# Patient Record
Sex: Female | Born: 1960 | Race: Black or African American | Marital: Single | Smoking: Never smoker
Health system: Southern US, Community
[De-identification: ages and names within clinical notes are randomized; demographics above are authoritative.]

## PROBLEM LIST (undated history)

## (undated) DIAGNOSIS — K754 Autoimmune hepatitis: Secondary | ICD-10-CM

## (undated) DIAGNOSIS — R7611 Nonspecific reaction to tuberculin skin test without active tuberculosis: Secondary | ICD-10-CM

## (undated) DIAGNOSIS — G43909 Migraine, unspecified, not intractable, without status migrainosus: Secondary | ICD-10-CM

## (undated) DIAGNOSIS — Z86010 Personal history of colonic polyps: Secondary | ICD-10-CM

## (undated) DIAGNOSIS — K7460 Unspecified cirrhosis of liver: Secondary | ICD-10-CM

## (undated) DIAGNOSIS — N189 Chronic kidney disease, unspecified: Secondary | ICD-10-CM

## (undated) DIAGNOSIS — D571 Sickle-cell disease without crisis: Secondary | ICD-10-CM

## (undated) DIAGNOSIS — F419 Anxiety disorder, unspecified: Secondary | ICD-10-CM

## (undated) DIAGNOSIS — M797 Fibromyalgia: Secondary | ICD-10-CM

## (undated) DIAGNOSIS — K648 Other hemorrhoids: Secondary | ICD-10-CM

## (undated) DIAGNOSIS — N289 Disorder of kidney and ureter, unspecified: Secondary | ICD-10-CM

## (undated) DIAGNOSIS — D509 Iron deficiency anemia, unspecified: Secondary | ICD-10-CM

## (undated) DIAGNOSIS — K644 Residual hemorrhoidal skin tags: Secondary | ICD-10-CM

## (undated) DIAGNOSIS — F32A Depression, unspecified: Secondary | ICD-10-CM

## (undated) DIAGNOSIS — Z789 Other specified health status: Secondary | ICD-10-CM

## (undated) DIAGNOSIS — M5020 Other cervical disc displacement, unspecified cervical region: Secondary | ICD-10-CM

## (undated) DIAGNOSIS — E785 Hyperlipidemia, unspecified: Secondary | ICD-10-CM

## (undated) DIAGNOSIS — K219 Gastro-esophageal reflux disease without esophagitis: Secondary | ICD-10-CM

## (undated) DIAGNOSIS — Z944 Liver transplant status: Secondary | ICD-10-CM

## (undated) DIAGNOSIS — F329 Major depressive disorder, single episode, unspecified: Secondary | ICD-10-CM

## (undated) HISTORY — DX: Migraine, unspecified, not intractable, without status migrainosus: G43.909

## (undated) HISTORY — DX: Chronic kidney disease, unspecified: N18.9

## (undated) HISTORY — DX: Hyperlipidemia, unspecified: E78.5

## (undated) HISTORY — DX: Iron deficiency anemia, unspecified: D50.9

## (undated) HISTORY — DX: Unspecified cirrhosis of liver: K74.60

## (undated) HISTORY — DX: Depression, unspecified: F32.A

## (undated) HISTORY — DX: Other specified health status: Z78.9

## (undated) HISTORY — DX: Gastro-esophageal reflux disease without esophagitis: K21.9

## (undated) HISTORY — DX: Sickle-cell disease without crisis: D57.1

## (undated) HISTORY — DX: Personal history of colonic polyps: Z86.010

## (undated) HISTORY — DX: Residual hemorrhoidal skin tags: K64.4

## (undated) HISTORY — DX: Major depressive disorder, single episode, unspecified: F32.9

## (undated) HISTORY — DX: Other cervical disc displacement, unspecified cervical region: M50.20

## (undated) HISTORY — DX: Autoimmune hepatitis: K75.4

## (undated) HISTORY — DX: Nonspecific reaction to tuberculin skin test without active tuberculosis: R76.11

## (undated) HISTORY — DX: Disorder of kidney and ureter, unspecified: N28.9

## (undated) HISTORY — DX: Other hemorrhoids: K64.8

## (undated) HISTORY — DX: Anxiety disorder, unspecified: F41.9

## (undated) HISTORY — DX: Fibromyalgia: M79.7

## (undated) HISTORY — DX: Liver transplant status: Z94.4

---

## 1990-06-04 HISTORY — PX: LIVER TRANSPLANT: SHX410

## 1997-08-30 ENCOUNTER — Other Ambulatory Visit: Admission: RE | Admit: 1997-08-30 | Discharge: 1997-08-30 | Payer: Self-pay | Admitting: Internal Medicine

## 1997-11-08 ENCOUNTER — Encounter (HOSPITAL_COMMUNITY): Admission: RE | Admit: 1997-11-08 | Discharge: 1998-02-06 | Payer: Self-pay | Admitting: Internal Medicine

## 1998-01-21 ENCOUNTER — Ambulatory Visit (HOSPITAL_COMMUNITY): Admission: RE | Admit: 1998-01-21 | Discharge: 1998-01-21 | Payer: Self-pay | Admitting: Obstetrics and Gynecology

## 1998-08-02 ENCOUNTER — Emergency Department (HOSPITAL_COMMUNITY): Admission: EM | Admit: 1998-08-02 | Discharge: 1998-08-03 | Payer: Self-pay | Admitting: Emergency Medicine

## 1998-08-03 ENCOUNTER — Encounter: Payer: Self-pay | Admitting: Emergency Medicine

## 1999-03-14 ENCOUNTER — Encounter (INDEPENDENT_AMBULATORY_CARE_PROVIDER_SITE_OTHER): Payer: Self-pay | Admitting: Specialist

## 1999-03-14 ENCOUNTER — Other Ambulatory Visit: Admission: RE | Admit: 1999-03-14 | Discharge: 1999-03-14 | Payer: Self-pay | Admitting: Internal Medicine

## 1999-03-14 ENCOUNTER — Encounter: Payer: Self-pay | Admitting: Internal Medicine

## 1999-09-20 ENCOUNTER — Encounter (HOSPITAL_COMMUNITY): Admission: RE | Admit: 1999-09-20 | Discharge: 1999-12-19 | Payer: Self-pay | Admitting: Internal Medicine

## 2000-01-30 ENCOUNTER — Encounter: Payer: Self-pay | Admitting: Internal Medicine

## 2000-01-30 ENCOUNTER — Ambulatory Visit (HOSPITAL_COMMUNITY): Admission: RE | Admit: 2000-01-30 | Discharge: 2000-01-30 | Payer: Self-pay | Admitting: Internal Medicine

## 2000-12-11 ENCOUNTER — Other Ambulatory Visit: Admission: RE | Admit: 2000-12-11 | Discharge: 2000-12-11 | Payer: Self-pay | Admitting: Obstetrics and Gynecology

## 2001-02-24 LAB — HM HEPATITIS C SCREENING LAB: HM HEPATITIS C SCREENING: NEGATIVE

## 2001-09-29 ENCOUNTER — Encounter: Payer: Self-pay | Admitting: Internal Medicine

## 2001-09-29 ENCOUNTER — Ambulatory Visit (HOSPITAL_COMMUNITY): Admission: RE | Admit: 2001-09-29 | Discharge: 2001-09-29 | Payer: Self-pay | Admitting: Internal Medicine

## 2002-06-22 ENCOUNTER — Encounter: Payer: Self-pay | Admitting: Obstetrics & Gynecology

## 2002-06-22 ENCOUNTER — Ambulatory Visit (HOSPITAL_COMMUNITY): Admission: RE | Admit: 2002-06-22 | Discharge: 2002-06-22 | Payer: Self-pay | Admitting: Obstetrics & Gynecology

## 2002-09-18 ENCOUNTER — Inpatient Hospital Stay (HOSPITAL_COMMUNITY): Admission: AD | Admit: 2002-09-18 | Discharge: 2002-09-18 | Payer: Self-pay | Admitting: Obstetrics & Gynecology

## 2003-03-25 ENCOUNTER — Encounter (HOSPITAL_COMMUNITY): Admission: RE | Admit: 2003-03-25 | Discharge: 2003-06-23 | Payer: Self-pay | Admitting: Internal Medicine

## 2003-09-15 ENCOUNTER — Other Ambulatory Visit: Admission: RE | Admit: 2003-09-15 | Discharge: 2003-09-15 | Payer: Self-pay | Admitting: Obstetrics and Gynecology

## 2003-10-29 ENCOUNTER — Ambulatory Visit (HOSPITAL_COMMUNITY): Admission: RE | Admit: 2003-10-29 | Discharge: 2003-10-29 | Payer: Self-pay | Admitting: Obstetrics and Gynecology

## 2003-11-02 ENCOUNTER — Encounter: Admission: RE | Admit: 2003-11-02 | Discharge: 2003-11-02 | Payer: Self-pay | Admitting: Obstetrics and Gynecology

## 2004-04-26 ENCOUNTER — Encounter: Admission: RE | Admit: 2004-04-26 | Discharge: 2004-04-26 | Payer: Self-pay | Admitting: Obstetrics and Gynecology

## 2004-06-12 ENCOUNTER — Ambulatory Visit: Payer: Self-pay | Admitting: Internal Medicine

## 2004-07-17 ENCOUNTER — Ambulatory Visit: Payer: Self-pay | Admitting: Internal Medicine

## 2004-10-19 ENCOUNTER — Ambulatory Visit: Payer: Self-pay | Admitting: Internal Medicine

## 2004-11-02 ENCOUNTER — Encounter: Admission: RE | Admit: 2004-11-02 | Discharge: 2004-11-02 | Payer: Self-pay | Admitting: Obstetrics and Gynecology

## 2005-02-22 ENCOUNTER — Encounter
Admission: RE | Admit: 2005-02-22 | Discharge: 2005-02-22 | Payer: Self-pay | Admitting: Physical Medicine and Rehabilitation

## 2005-03-15 ENCOUNTER — Ambulatory Visit: Payer: Self-pay | Admitting: Internal Medicine

## 2005-04-12 ENCOUNTER — Other Ambulatory Visit: Admission: RE | Admit: 2005-04-12 | Discharge: 2005-04-12 | Payer: Self-pay | Admitting: Obstetrics and Gynecology

## 2005-08-16 ENCOUNTER — Ambulatory Visit: Payer: Self-pay | Admitting: Internal Medicine

## 2005-09-07 ENCOUNTER — Encounter (HOSPITAL_COMMUNITY): Admission: RE | Admit: 2005-09-07 | Discharge: 2005-09-13 | Payer: Self-pay | Admitting: Internal Medicine

## 2005-10-31 ENCOUNTER — Ambulatory Visit: Payer: Self-pay | Admitting: Internal Medicine

## 2005-11-20 ENCOUNTER — Encounter: Admission: RE | Admit: 2005-11-20 | Discharge: 2005-11-20 | Payer: Self-pay | Admitting: Internal Medicine

## 2005-12-13 ENCOUNTER — Ambulatory Visit: Payer: Self-pay | Admitting: Internal Medicine

## 2005-12-14 ENCOUNTER — Ambulatory Visit (HOSPITAL_COMMUNITY): Admission: RE | Admit: 2005-12-14 | Discharge: 2005-12-14 | Payer: Self-pay | Admitting: Internal Medicine

## 2005-12-26 ENCOUNTER — Ambulatory Visit: Payer: Self-pay | Admitting: Internal Medicine

## 2006-01-02 ENCOUNTER — Ambulatory Visit: Payer: Self-pay | Admitting: Internal Medicine

## 2006-04-26 ENCOUNTER — Ambulatory Visit: Payer: Self-pay | Admitting: Internal Medicine

## 2006-04-26 LAB — CONVERTED CEMR LAB
Albumin: 3.5 g/dL (ref 3.5–5.2)
BUN: 14 mg/dL (ref 6–23)
Calcium: 8.6 mg/dL (ref 8.4–10.5)
Chloride: 107 meq/L (ref 96–112)
HCT: 27.8 % — ABNORMAL LOW (ref 36.0–46.0)
Hemoglobin: 9 g/dL — ABNORMAL LOW (ref 12.0–15.0)
RBC: 3.39 M/uL — ABNORMAL LOW (ref 3.87–5.11)
RDW: 14.5 % (ref 11.5–14.6)
Sodium: 140 meq/L (ref 135–145)
Total Bilirubin: 0.5 mg/dL (ref 0.3–1.2)

## 2006-05-03 ENCOUNTER — Ambulatory Visit: Payer: Self-pay | Admitting: Internal Medicine

## 2006-05-17 ENCOUNTER — Ambulatory Visit (HOSPITAL_COMMUNITY): Admission: RE | Admit: 2006-05-17 | Discharge: 2006-05-17 | Payer: Self-pay | Admitting: Internal Medicine

## 2006-09-17 ENCOUNTER — Ambulatory Visit: Payer: Self-pay | Admitting: Internal Medicine

## 2006-09-17 LAB — CONVERTED CEMR LAB
Alkaline Phosphatase: 96 units/L (ref 39–117)
BUN: 20 mg/dL (ref 6–23)
Basophils Relative: 0.4 % (ref 0.0–1.0)
Chloride: 109 meq/L (ref 96–112)
Glucose, Bld: 103 mg/dL — ABNORMAL HIGH (ref 70–99)
Hemoglobin: 11.3 g/dL — ABNORMAL LOW (ref 12.0–15.0)
Lymphocytes Relative: 23.3 % (ref 12.0–46.0)
MCHC: 35.3 g/dL (ref 30.0–36.0)
Monocytes Relative: 4.4 % (ref 3.0–11.0)
Neutro Abs: 5.6 10*3/uL (ref 1.4–7.7)
Neutrophils Relative %: 70.2 % (ref 43.0–77.0)
Platelets: 155 10*3/uL (ref 150–400)
Potassium: 3.7 meq/L (ref 3.5–5.1)
RBC: 3.55 M/uL — ABNORMAL LOW (ref 3.87–5.11)
RDW: 13.1 % (ref 11.5–14.6)
Total Bilirubin: 0.5 mg/dL (ref 0.3–1.2)

## 2006-11-05 ENCOUNTER — Ambulatory Visit: Payer: Self-pay | Admitting: Internal Medicine

## 2006-11-05 LAB — CONVERTED CEMR LAB
CO2: 28 meq/L (ref 19–32)
Chloride: 109 meq/L (ref 96–112)
Potassium: 3.4 meq/L — ABNORMAL LOW (ref 3.5–5.1)
Sodium: 141 meq/L (ref 135–145)
Total Bilirubin: 0.5 mg/dL (ref 0.3–1.2)

## 2006-12-24 ENCOUNTER — Ambulatory Visit: Payer: Self-pay | Admitting: Internal Medicine

## 2006-12-24 LAB — CONVERTED CEMR LAB
Basophils Relative: 1.2 % — ABNORMAL HIGH (ref 0.0–1.0)
Eosinophils Absolute: 0.1 10*3/uL (ref 0.0–0.6)
Eosinophils Relative: 2.7 % (ref 0.0–5.0)
HCT: 30.4 % — ABNORMAL LOW (ref 36.0–46.0)
MCV: 82.4 fL (ref 78.0–100.0)
Neutrophils Relative %: 47.9 % (ref 43.0–77.0)
RBC: 3.69 M/uL — ABNORMAL LOW (ref 3.87–5.11)
RDW: 14.2 % (ref 11.5–14.6)
WBC: 5.1 10*3/uL (ref 4.5–10.5)

## 2007-01-08 ENCOUNTER — Encounter (HOSPITAL_COMMUNITY): Admission: RE | Admit: 2007-01-08 | Discharge: 2007-03-03 | Payer: Self-pay | Admitting: Internal Medicine

## 2007-02-18 ENCOUNTER — Ambulatory Visit: Payer: Self-pay | Admitting: Internal Medicine

## 2007-02-18 LAB — CONVERTED CEMR LAB
Alkaline Phosphatase: 95 units/L (ref 39–117)
BUN: 14 mg/dL (ref 6–23)
Bilirubin, Direct: 0.1 mg/dL (ref 0.0–0.3)
CO2: 23 meq/L (ref 19–32)
Calcium: 9.2 mg/dL (ref 8.4–10.5)
Eosinophils Relative: 2.1 % (ref 0.0–5.0)
GFR calc Af Amer: 52 mL/min
GFR calc non Af Amer: 43 mL/min
Glucose, Bld: 121 mg/dL — ABNORMAL HIGH (ref 70–99)
Lymphocytes Relative: 35.1 % (ref 12.0–46.0)
MCHC: 33 g/dL (ref 30.0–36.0)
MCV: 84.7 fL (ref 78.0–100.0)
Monocytes Relative: 7.8 % (ref 3.0–11.0)
Platelets: 184 10*3/uL (ref 150–400)
Potassium: 3.7 meq/L (ref 3.5–5.1)
RDW: 18.4 % — ABNORMAL HIGH (ref 11.5–14.6)
Sodium: 144 meq/L (ref 135–145)

## 2007-03-31 ENCOUNTER — Ambulatory Visit: Payer: Self-pay | Admitting: Internal Medicine

## 2007-03-31 LAB — CONVERTED CEMR LAB
Basophils Absolute: 0 10*3/uL (ref 0.0–0.1)
Eosinophils Relative: 1.6 % (ref 0.0–5.0)
HCT: 33.4 % — ABNORMAL LOW (ref 36.0–46.0)
Hemoglobin: 11.3 g/dL — ABNORMAL LOW (ref 12.0–15.0)
Iron: 43 ug/dL (ref 42–145)
MCHC: 33.9 g/dL (ref 30.0–36.0)
MCV: 88.1 fL (ref 78.0–100.0)
Neutro Abs: 3.6 10*3/uL (ref 1.4–7.7)
Neutrophils Relative %: 57.7 % (ref 43.0–77.0)
WBC: 6.1 10*3/uL (ref 4.5–10.5)

## 2007-04-21 ENCOUNTER — Ambulatory Visit: Payer: Self-pay | Admitting: Internal Medicine

## 2007-04-21 LAB — CONVERTED CEMR LAB
Albumin: 3.4 g/dL — ABNORMAL LOW (ref 3.5–5.2)
Alkaline Phosphatase: 82 units/L (ref 39–117)
Basophils Relative: 0.3 % (ref 0.0–1.0)
Bilirubin, Direct: 0.1 mg/dL (ref 0.0–0.3)
Chloride: 105 meq/L (ref 96–112)
Creatinine, Ser: 1.8 mg/dL — ABNORMAL HIGH (ref 0.4–1.2)
Eosinophils Absolute: 0.1 10*3/uL (ref 0.0–0.6)
HCT: 36 % (ref 36.0–46.0)
HDL: 36.1 mg/dL — ABNORMAL LOW (ref >39.0)
Monocytes Relative: 10.2 % (ref 3.0–11.0)
Neutro Abs: 3.1 10*3/uL (ref 1.4–7.7)
Neutrophils Relative %: 54.9 % (ref 43.0–77.0)
Potassium: 3.4 meq/L — ABNORMAL LOW (ref 3.5–5.1)
RDW: 14.4 % (ref 11.5–14.6)
Sodium: 136 meq/L (ref 135–145)
Total Protein: 6.6 g/dL (ref 6.0–8.3)

## 2007-06-23 ENCOUNTER — Ambulatory Visit: Payer: Self-pay | Admitting: Internal Medicine

## 2007-09-16 DIAGNOSIS — K754 Autoimmune hepatitis: Secondary | ICD-10-CM | POA: Insufficient documentation

## 2007-09-16 DIAGNOSIS — D509 Iron deficiency anemia, unspecified: Secondary | ICD-10-CM

## 2007-09-16 DIAGNOSIS — K644 Residual hemorrhoidal skin tags: Secondary | ICD-10-CM | POA: Insufficient documentation

## 2007-09-16 DIAGNOSIS — F419 Anxiety disorder, unspecified: Secondary | ICD-10-CM

## 2007-09-16 DIAGNOSIS — K648 Other hemorrhoids: Secondary | ICD-10-CM | POA: Insufficient documentation

## 2007-09-16 DIAGNOSIS — Z8659 Personal history of other mental and behavioral disorders: Secondary | ICD-10-CM

## 2007-09-22 ENCOUNTER — Ambulatory Visit (HOSPITAL_COMMUNITY): Admission: RE | Admit: 2007-09-22 | Discharge: 2007-09-22 | Payer: Self-pay | Admitting: Internal Medicine

## 2007-09-22 ENCOUNTER — Encounter (INDEPENDENT_AMBULATORY_CARE_PROVIDER_SITE_OTHER): Payer: Self-pay | Admitting: *Deleted

## 2007-10-08 ENCOUNTER — Telehealth: Payer: Self-pay | Admitting: Internal Medicine

## 2007-10-14 ENCOUNTER — Ambulatory Visit: Payer: Self-pay | Admitting: Internal Medicine

## 2007-10-15 LAB — CONVERTED CEMR LAB
ALT: 12 units/L (ref 0–35)
AST: 16 units/L (ref 0–37)
Alkaline Phosphatase: 101 units/L (ref 39–117)
Alkaline Phosphatase: 62 units/L (ref 39–117)
BUN: 13 mg/dL (ref 6–23)
Basophils Absolute: 0 10*3/uL (ref 0.0–0.1)
Basophils Relative: 0.4 % (ref 0.0–1.0)
Bilirubin, Direct: 0.1 mg/dL (ref 0.0–0.3)
CO2: 26 meq/L (ref 19–32)
Calcium: 8.6 mg/dL (ref 8.4–10.5)
Creatinine, Ser: 1.5 mg/dL — ABNORMAL HIGH (ref 0.4–1.2)
Eosinophils Absolute: 0.2 10*3/uL (ref 0.0–0.7)
GFR calc non Af Amer: 40 mL/min
GFR calc non Af Amer: 40 mL/min
HCT: 33.4 % — ABNORMAL LOW (ref 36.0–46.0)
Hemoglobin: 11.5 g/dL — ABNORMAL LOW (ref 12.0–15.0)
Hemoglobin: 8.8 g/dL — ABNORMAL LOW (ref 12.0–15.0)
Lymphocytes Relative: 25.5 % (ref 12.0–46.0)
Lymphocytes Relative: 30.6 % (ref 12.0–46.0)
MCHC: 31.9 g/dL (ref 30.0–36.0)
MCHC: 34.3 g/dL (ref 30.0–36.0)
Monocytes Absolute: 0.5 10*3/uL (ref 0.1–1.0)
Monocytes Absolute: 0.8 10*3/uL — ABNORMAL HIGH (ref 0.2–0.7)
Monocytes Relative: 8.5 % (ref 3.0–12.0)
Neutro Abs: 3 10*3/uL (ref 1.4–7.7)
Neutrophils Relative %: 51.4 % (ref 43.0–77.0)
RDW: 14.2 % (ref 11.5–14.6)
Total Protein: 6.2 g/dL (ref 6.0–8.3)

## 2007-10-17 ENCOUNTER — Encounter (INDEPENDENT_AMBULATORY_CARE_PROVIDER_SITE_OTHER): Payer: Self-pay

## 2007-10-21 ENCOUNTER — Telehealth: Payer: Self-pay | Admitting: Internal Medicine

## 2007-10-24 ENCOUNTER — Ambulatory Visit (HOSPITAL_COMMUNITY): Admission: RE | Admit: 2007-10-24 | Discharge: 2007-10-24 | Payer: Self-pay | Admitting: Internal Medicine

## 2007-10-29 ENCOUNTER — Encounter (HOSPITAL_COMMUNITY): Admission: RE | Admit: 2007-10-29 | Discharge: 2008-01-16 | Payer: Self-pay | Admitting: Internal Medicine

## 2007-10-29 ENCOUNTER — Encounter: Payer: Self-pay | Admitting: Internal Medicine

## 2008-02-04 ENCOUNTER — Ambulatory Visit: Payer: Self-pay | Admitting: Internal Medicine

## 2008-02-04 LAB — CONVERTED CEMR LAB
AST: 20 units/L (ref 0–37)
Alkaline Phosphatase: 103 units/L (ref 39–117)
Basophils Absolute: 0 10*3/uL (ref 0.0–0.1)
Basophils Relative: 0.4 % (ref 0.0–3.0)
Bilirubin, Direct: 0.1 mg/dL (ref 0.0–0.3)
Cholesterol: 185 mg/dL (ref 0–200)
Eosinophils Relative: 3 % (ref 0.0–5.0)
GFR calc Af Amer: 48 mL/min
GFR calc non Af Amer: 40 mL/min
Glucose, Bld: 93 mg/dL (ref 70–99)
HDL: 44 mg/dL (ref >39.0)
LDL Cholesterol: 112 mg/dL — ABNORMAL HIGH (ref 0–99)
Lymphocytes Relative: 35.2 % (ref 12.0–46.0)
MCV: 90.5 fL (ref 78.0–100.0)
Monocytes Absolute: 0.6 10*3/uL (ref 0.1–1.0)
Monocytes Relative: 11.9 % (ref 3.0–12.0)
RBC: 3.96 M/uL (ref 3.87–5.11)
RDW: 13.2 % (ref 11.5–14.6)
Total Bilirubin: 0.6 mg/dL (ref 0.3–1.2)
VLDL: 29 mg/dL (ref 0–40)
WBC: 5.3 10*3/uL (ref 4.5–10.5)

## 2008-02-19 ENCOUNTER — Inpatient Hospital Stay (HOSPITAL_COMMUNITY): Admission: AD | Admit: 2008-02-19 | Discharge: 2008-02-19 | Payer: Self-pay | Admitting: Obstetrics and Gynecology

## 2008-03-10 ENCOUNTER — Ambulatory Visit: Payer: Self-pay | Admitting: Internal Medicine

## 2008-03-15 ENCOUNTER — Telehealth: Payer: Self-pay | Admitting: Internal Medicine

## 2008-03-19 ENCOUNTER — Ambulatory Visit: Payer: Self-pay | Admitting: Internal Medicine

## 2008-03-19 ENCOUNTER — Encounter: Payer: Self-pay | Admitting: Internal Medicine

## 2008-03-23 ENCOUNTER — Encounter: Payer: Self-pay | Admitting: Internal Medicine

## 2008-03-24 ENCOUNTER — Telehealth: Payer: Self-pay | Admitting: Internal Medicine

## 2008-04-04 ENCOUNTER — Encounter: Payer: Self-pay | Admitting: Internal Medicine

## 2008-05-05 DIAGNOSIS — Z860100 Personal history of colon polyps, unspecified: Secondary | ICD-10-CM

## 2008-05-05 HISTORY — DX: Personal history of colon polyps, unspecified: Z86.0100

## 2008-05-05 HISTORY — DX: Personal history of colonic polyps: Z86.010

## 2008-05-11 ENCOUNTER — Ambulatory Visit: Payer: Self-pay | Admitting: Internal Medicine

## 2008-05-12 ENCOUNTER — Encounter: Payer: Self-pay | Admitting: Internal Medicine

## 2008-06-04 HISTORY — PX: ROTATOR CUFF REPAIR: SHX139

## 2008-06-04 HISTORY — PX: ABDOMINAL HYSTERECTOMY: SHX81

## 2008-06-04 HISTORY — PX: KNEE ARTHROSCOPY: SUR90

## 2008-08-30 ENCOUNTER — Encounter: Payer: Self-pay | Admitting: Internal Medicine

## 2008-11-18 ENCOUNTER — Ambulatory Visit (HOSPITAL_COMMUNITY): Admission: RE | Admit: 2008-11-18 | Discharge: 2008-11-18 | Payer: Self-pay | Admitting: Orthopedic Surgery

## 2008-11-25 ENCOUNTER — Ambulatory Visit: Payer: Self-pay | Admitting: Internal Medicine

## 2008-11-26 LAB — CONVERTED CEMR LAB
ALT: 18 units/L (ref 0–35)
AST: 17 units/L (ref 0–37)
Albumin: 3.6 g/dL (ref 3.5–5.2)
BUN: 18 mg/dL (ref 6–23)
Basophils Absolute: 0 10*3/uL (ref 0.0–0.1)
Bilirubin, Direct: 0.1 mg/dL (ref 0.0–0.3)
Calcium: 8.9 mg/dL (ref 8.4–10.5)
Chloride: 106 meq/L (ref 96–112)
Creatinine, Ser: 1.5 mg/dL — ABNORMAL HIGH (ref 0.4–1.2)
Eosinophils Absolute: 0.1 10*3/uL (ref 0.0–0.7)
Eosinophils Relative: 2.1 % (ref 0.0–5.0)
Glucose, Bld: 99 mg/dL (ref 70–99)
HCT: 35.1 % — ABNORMAL LOW (ref 36.0–46.0)
Lymphocytes Relative: 27.6 % (ref 12.0–46.0)
MCHC: 32.6 g/dL (ref 30.0–36.0)
MCV: 85.9 fL (ref 78.0–100.0)
Neutrophils Relative %: 61 % (ref 43.0–77.0)
Platelets: 160 10*3/uL (ref 150.0–400.0)
Potassium: 3.3 meq/L — ABNORMAL LOW (ref 3.5–5.1)
RDW: 15.4 % — ABNORMAL HIGH (ref 11.5–14.6)
Total Bilirubin: 0.6 mg/dL (ref 0.3–1.2)

## 2009-01-10 ENCOUNTER — Telehealth: Payer: Self-pay | Admitting: Internal Medicine

## 2009-01-25 ENCOUNTER — Ambulatory Visit: Payer: Self-pay | Admitting: Internal Medicine

## 2009-01-25 LAB — CONVERTED CEMR LAB
ALT: 26 units/L (ref 0–35)
Albumin: 3.7 g/dL (ref 3.5–5.2)
BUN: 20 mg/dL (ref 6–23)
Chloride: 107 meq/L (ref 96–112)
Creatinine, Ser: 1.7 mg/dL — ABNORMAL HIGH (ref 0.4–1.2)
Glucose, Bld: 131 mg/dL — ABNORMAL HIGH (ref 70–99)
Lymphocytes Relative: 32.2 % (ref 12.0–46.0)
Lymphs Abs: 2.2 10*3/uL (ref 0.7–4.0)
MCHC: 33.3 g/dL (ref 30.0–36.0)
MCV: 86 fL (ref 78.0–100.0)
Monocytes Relative: 7.6 % (ref 3.0–12.0)
Neutrophils Relative %: 56.8 % (ref 43.0–77.0)
Potassium: 3.7 meq/L (ref 3.5–5.1)
RBC: 4.61 M/uL (ref 3.87–5.11)
Sodium: 140 meq/L (ref 135–145)
Total Bilirubin: 0.6 mg/dL (ref 0.3–1.2)
WBC: 6.9 10*3/uL (ref 4.5–10.5)

## 2009-02-14 ENCOUNTER — Ambulatory Visit: Payer: Self-pay | Admitting: Internal Medicine

## 2009-02-22 ENCOUNTER — Ambulatory Visit: Payer: Self-pay | Admitting: Internal Medicine

## 2009-02-22 ENCOUNTER — Telehealth: Payer: Self-pay | Admitting: Internal Medicine

## 2009-02-22 DIAGNOSIS — R197 Diarrhea, unspecified: Secondary | ICD-10-CM

## 2009-02-22 DIAGNOSIS — R944 Abnormal results of kidney function studies: Secondary | ICD-10-CM

## 2009-02-22 DIAGNOSIS — R112 Nausea with vomiting, unspecified: Secondary | ICD-10-CM

## 2009-02-22 DIAGNOSIS — R252 Cramp and spasm: Secondary | ICD-10-CM

## 2009-02-22 DIAGNOSIS — Z944 Liver transplant status: Secondary | ICD-10-CM

## 2009-02-22 DIAGNOSIS — Z9189 Other specified personal risk factors, not elsewhere classified: Secondary | ICD-10-CM | POA: Insufficient documentation

## 2009-02-22 HISTORY — DX: Liver transplant status: Z94.4

## 2009-02-22 LAB — CONVERTED CEMR LAB
Albumin: 3.7 g/dL (ref 3.5–5.2)
Alkaline Phosphatase: 80 units/L (ref 39–117)
Basophils Absolute: 0 10*3/uL (ref 0.0–0.1)
CO2: 27 meq/L (ref 19–32)
Chloride: 109 meq/L (ref 96–112)
Creatinine, Ser: 1.5 mg/dL — ABNORMAL HIGH (ref 0.4–1.2)
Eosinophils Absolute: 0.1 10*3/uL (ref 0.0–0.7)
GFR calc non Af Amer: 39.27 mL/min (ref >60)
Glucose, Bld: 98 mg/dL (ref 70–99)
HCT: 37.2 % (ref 36.0–46.0)
Hemoglobin: 12.2 g/dL (ref 12.0–15.0)
INR: 1 (ref 0.8–1.0)
MCHC: 32.7 g/dL (ref 30.0–36.0)
MCV: 86.2 fL (ref 78.0–100.0)
Neutrophils Relative %: 46.9 % (ref 43.0–77.0)
Platelets: 161 10*3/uL (ref 150.0–400.0)
Potassium: 3.8 meq/L (ref 3.5–5.1)
Prothrombin Time: 10.6 s (ref 9.1–11.7)
RDW: 14.2 % (ref 11.5–14.6)

## 2009-03-30 ENCOUNTER — Encounter: Payer: Self-pay | Admitting: Internal Medicine

## 2009-04-27 ENCOUNTER — Ambulatory Visit: Payer: Self-pay | Admitting: Internal Medicine

## 2009-05-26 ENCOUNTER — Telehealth: Payer: Self-pay | Admitting: Internal Medicine

## 2009-06-10 ENCOUNTER — Ambulatory Visit: Payer: Self-pay | Admitting: Internal Medicine

## 2009-06-13 ENCOUNTER — Encounter: Payer: Self-pay | Admitting: Internal Medicine

## 2009-06-13 LAB — CONVERTED CEMR LAB
AST: 30 units/L (ref 0–37)
Albumin: 3.4 g/dL — ABNORMAL LOW (ref 3.5–5.2)
BUN: 9 mg/dL (ref 6–23)
Basophils Relative: 0.4 % (ref 0.0–3.0)
CO2: 28 meq/L (ref 19–32)
Calcium: 9.1 mg/dL (ref 8.4–10.5)
Eosinophils Absolute: 0.2 10*3/uL (ref 0.0–0.7)
Eosinophils Relative: 3 % (ref 0.0–5.0)
Glucose, Bld: 96 mg/dL (ref 70–99)
HCT: 37.7 % (ref 36.0–46.0)
Hemoglobin: 12 g/dL (ref 12.0–15.0)
Lymphs Abs: 2 10*3/uL (ref 0.7–4.0)
MCHC: 32 g/dL (ref 30.0–36.0)
Neutro Abs: 4.1 10*3/uL (ref 1.4–7.7)
Neutrophils Relative %: 59.1 % (ref 43.0–77.0)
RBC: 4.08 M/uL (ref 3.87–5.11)
Sodium: 138 meq/L (ref 135–145)
WBC: 6.8 10*3/uL (ref 4.5–10.5)

## 2009-06-28 ENCOUNTER — Ambulatory Visit: Payer: Self-pay | Admitting: Internal Medicine

## 2009-06-30 LAB — CONVERTED CEMR LAB
Alkaline Phosphatase: 111 units/L (ref 39–117)
BUN: 13 mg/dL (ref 6–23)
Bilirubin, Direct: 0.1 mg/dL (ref 0.0–0.3)
Chloride: 106 meq/L (ref 96–112)
Creatinine, Ser: 1.4 mg/dL — ABNORMAL HIGH (ref 0.4–1.2)
Eosinophils Absolute: 0.2 10*3/uL (ref 0.0–0.7)
GFR calc non Af Amer: 42.46 mL/min (ref >60)
Glucose, Bld: 94 mg/dL (ref 70–99)
HCT: 39.3 % (ref 36.0–46.0)
Lymphs Abs: 2 10*3/uL (ref 0.7–4.0)
MCV: 90.4 fL (ref 78.0–100.0)
Monocytes Absolute: 0.5 10*3/uL (ref 0.1–1.0)
Neutrophils Relative %: 54.5 % (ref 43.0–77.0)
Platelets: 192 10*3/uL (ref 150.0–400.0)
RDW: 12.7 % (ref 11.5–14.6)
Total Bilirubin: 0.6 mg/dL (ref 0.3–1.2)

## 2009-07-01 ENCOUNTER — Encounter: Payer: Self-pay | Admitting: Internal Medicine

## 2009-08-15 ENCOUNTER — Telehealth: Payer: Self-pay | Admitting: Internal Medicine

## 2009-08-29 ENCOUNTER — Encounter: Payer: Self-pay | Admitting: Internal Medicine

## 2009-09-27 ENCOUNTER — Telehealth: Payer: Self-pay | Admitting: Internal Medicine

## 2009-10-28 ENCOUNTER — Ambulatory Visit: Payer: Self-pay | Admitting: Internal Medicine

## 2009-11-01 ENCOUNTER — Ambulatory Visit: Payer: Self-pay | Admitting: Internal Medicine

## 2009-11-01 LAB — CONVERTED CEMR LAB
Albumin: 3.9 g/dL (ref 3.5–5.2)
Alkaline Phosphatase: 113 units/L (ref 39–117)
Basophils Relative: 0.4 % (ref 0.0–3.0)
CO2: 27 meq/L (ref 19–32)
Chloride: 107 meq/L (ref 96–112)
Direct LDL: 114.1 mg/dL
Eosinophils Absolute: 0.1 10*3/uL (ref 0.0–0.7)
HCT: 39.3 % (ref 36.0–46.0)
Hemoglobin: 13.4 g/dL (ref 12.0–15.0)
MCHC: 34.2 g/dL (ref 30.0–36.0)
MCV: 89.5 fL (ref 78.0–100.0)
Monocytes Absolute: 0.3 10*3/uL (ref 0.1–1.0)
Neutrophils Relative %: 62.3 % (ref 43.0–77.0)
Platelets: 209 10*3/uL (ref 150.0–400.0)
Potassium: 4 meq/L (ref 3.5–5.1)
RDW: 14.7 % — ABNORMAL HIGH (ref 11.5–14.6)
Total Bilirubin: 0.3 mg/dL (ref 0.3–1.2)
Total Protein: 7 g/dL (ref 6.0–8.3)
Triglycerides: 244 mg/dL — ABNORMAL HIGH (ref 0.0–149.0)

## 2009-11-25 ENCOUNTER — Telehealth (INDEPENDENT_AMBULATORY_CARE_PROVIDER_SITE_OTHER): Payer: Self-pay | Admitting: *Deleted

## 2010-01-13 ENCOUNTER — Encounter: Payer: Self-pay | Admitting: Internal Medicine

## 2010-01-25 ENCOUNTER — Encounter (INDEPENDENT_AMBULATORY_CARE_PROVIDER_SITE_OTHER): Payer: Self-pay | Admitting: *Deleted

## 2010-01-30 ENCOUNTER — Telehealth: Payer: Self-pay | Admitting: Internal Medicine

## 2010-03-03 ENCOUNTER — Encounter (INDEPENDENT_AMBULATORY_CARE_PROVIDER_SITE_OTHER): Payer: Self-pay | Admitting: *Deleted

## 2010-03-07 ENCOUNTER — Telehealth: Payer: Self-pay | Admitting: Internal Medicine

## 2010-03-07 ENCOUNTER — Ambulatory Visit: Payer: Self-pay | Admitting: Internal Medicine

## 2010-03-07 LAB — CONVERTED CEMR LAB
ALT: 25 units/L (ref 0–35)
Alkaline Phosphatase: 127 units/L — ABNORMAL HIGH (ref 39–117)
BUN: 15 mg/dL (ref 6–23)
Basophils Relative: 0.4 % (ref 0.0–3.0)
CO2: 27 meq/L (ref 19–32)
Calcium: 9 mg/dL (ref 8.4–10.5)
Chloride: 107 meq/L (ref 96–112)
Creatinine, Ser: 1.3 mg/dL — ABNORMAL HIGH (ref 0.4–1.2)
Eosinophils Absolute: 0.2 10*3/uL (ref 0.0–0.7)
Glucose, Bld: 87 mg/dL (ref 70–99)
Iron: 64 ug/dL (ref 42–145)
Lymphocytes Relative: 32.4 % (ref 12.0–46.0)
MCV: 91.2 fL (ref 78.0–100.0)
Platelets: 172 10*3/uL (ref 150.0–400.0)

## 2010-03-09 ENCOUNTER — Telehealth: Payer: Self-pay | Admitting: Internal Medicine

## 2010-03-23 ENCOUNTER — Ambulatory Visit: Payer: Self-pay | Admitting: Internal Medicine

## 2010-05-10 ENCOUNTER — Ambulatory Visit: Payer: Self-pay | Admitting: Internal Medicine

## 2010-06-01 ENCOUNTER — Encounter: Payer: Self-pay | Admitting: Internal Medicine

## 2010-06-24 ENCOUNTER — Encounter: Payer: Self-pay | Admitting: Obstetrics and Gynecology

## 2010-06-25 ENCOUNTER — Encounter: Payer: Self-pay | Admitting: Internal Medicine

## 2010-06-27 ENCOUNTER — Encounter: Payer: Self-pay | Admitting: Internal Medicine

## 2010-07-04 ENCOUNTER — Ambulatory Visit
Admission: RE | Admit: 2010-07-04 | Discharge: 2010-07-04 | Payer: Self-pay | Source: Home / Self Care | Attending: Internal Medicine | Admitting: Internal Medicine

## 2010-07-04 ENCOUNTER — Other Ambulatory Visit: Payer: Self-pay | Admitting: Internal Medicine

## 2010-07-04 ENCOUNTER — Encounter: Payer: Self-pay | Admitting: Internal Medicine

## 2010-07-04 LAB — COMPREHENSIVE METABOLIC PANEL
ALT: 24 U/L (ref 0–35)
AST: 19 U/L (ref 0–37)
Alkaline Phosphatase: 91 U/L (ref 39–117)
Sodium: 139 mEq/L (ref 135–145)
Total Bilirubin: 0.6 mg/dL (ref 0.3–1.2)
Total Protein: 7 g/dL (ref 6.0–8.3)

## 2010-07-04 LAB — CBC WITH DIFFERENTIAL/PLATELET
Basophils Absolute: 0 10*3/uL (ref 0.0–0.1)
HCT: 39.5 % (ref 36.0–46.0)
Lymphs Abs: 2.8 10*3/uL (ref 0.7–4.0)
MCV: 90.3 fl (ref 78.0–100.0)
Monocytes Absolute: 0.6 10*3/uL (ref 0.1–1.0)
Platelets: 182 10*3/uL (ref 150.0–400.0)
RDW: 13.5 % (ref 11.5–14.6)

## 2010-07-04 NOTE — Letter (Signed)
Summary: [facility] Instructions  [facility]  [address] [place], [place] [address]   Phone: [phone]  Fax: [phone]       [facility]    1960-11-16    MRN: [id number]       Procedure Day /Date:  Thursday 03/23/2010     Arrival Time: 8:30 am     Procedure Time: 9:30 am     Location of Procedure:                    _ x_  [facility] (4th Floor)    PREPARATION FOR COLONOSCOPY WITH MIRALAX  Starting 5 days prior to your procedure Saturday 10/15 do not eat nuts, seeds, popcorn, corn, beans, peas,  salads, or any raw vegetables.  Do not take any fiber supplements (e.g. Metamucil, Citrucel, and Benefiber). ____________________________________________________________________________________________________   THE DAY BEFORE YOUR PROCEDURE         DATE: Wednesday 10/19  1   Drink clear liquids the entire day-NO SOLID FOOD  2   Do not drink anything colored red or purple.  Avoid juices with pulp.  No orange juice.  3   Drink at least 64 oz. (8 glasses) of fluid/clear liquids during the day to prevent dehydration and help the prep work efficiently.  CLEAR LIQUIDS INCLUDE: Water Jello Ice Popsicles Tea (sugar ok, no milk/cream) Powdered fruit flavored drinks Coffee (sugar ok, no milk/cream) Gatorade Juice: apple, white grape, white cranberry  Lemonade Clear bullion, consomm, broth Carbonated beverages (any kind) Strained chicken noodle soup Hard Candy  4   Mix the entire bottle of Miralax with 64 oz. of Gatorade/Powerade in the morning and put in the refrigerator to chill.  5   At 3:00 pm take 2 Dulcolax/Bisacodyl tablets.  6   At 4:30 pm take one Reglan/Metoclopramide tablet.  7  Starting at 5:00 pm drink one 8 oz glass of the Miralax mixture every 15-20 minutes until you have finished drinking the entire 64 oz.  You should finish drinking prep around 7:30 or 8:00 pm.  8   If you are nauseated, you may take the 2nd  Reglan/Metoclopramide tablet at 6:30 pm.        9    At 8:00 pm take 2 more DULCOLAX/Bisacodyl tablets.     THE DAY OF YOUR PROCEDURE      DATE:  Thursday 10/20  You may drink clear liquids until 7:30 am   (2 HOURS BEFORE PROCEDURE).   MEDICATION INSTRUCTIONS  Unless otherwise instructed, you should take regular prescription medications with a small sip of water as early as possible the morning of your procedure.          OTHER INSTRUCTIONS  You will need a responsible adult at least 51 years of age to accompany you and drive you home.   This person must remain in the waiting room during your procedure.  Wear loose fitting clothing that is easily removed.  Leave jewelry and other valuables at home.  However, you may wish to bring a book to read or an iPod/MP3 player to listen to music as you wait for your procedure to start.  Remove all body piercing jewelry and leave at home.  Total time from sign-in until discharge is approximately 2-3 hours.  You should go home directly after your procedure and rest.  You can resume normal activities the day after your procedure.  The day of your procedure you should not:   Drive  Make legal decisions   Operate machinery   Drink alcohol   Return to work  You will receive specific instructions about eating, activities and medications before you leave.   The above instructions have been reviewed and explained to me by   Ezra Sites RN  March 07, 2010 10:15 AM     I fully understand and can verbalize these instructions _____________________________ Date _______

## 2010-07-04 NOTE — Progress Notes (Signed)
Summary: refills  Phone Note Call from Patient Call back at Home Phone [phone]   Caller: Patient Call For: Dr [name] Reason for Call: Talk to Nurse Summary of Call: Patient needs refills for Clonazepam Initial call taken by: Tawni Levy,  January 30, 2010 11:51 AM  Follow-up for Phone Call        Patient advised that she is due for colonoscopy. She has set up procedure for March 23, 2010. I will give her enough clonazepam to get her through until then. Patient also requests refills on tennuate dospan. Dr [name], are you okay with her getting refills on her diet pills? Follow-up by: Lamona Curl CMA Duncan Dull),  January 30, 2010 12:24 PM  Additional Follow-up for Phone Call Additional follow up Details #1::        She can have 1 refill only. She has to show that she has lost weight. OK to refill Clonazepam till I see her. Additional Follow-up by: Hart Carwin MD,  January 30, 2010 2:31 PM    Additional Follow-up for Phone Call Additional follow up Details #2::    Prescription faxed to pharmacy. Follow-up by: Lamona Curl CMA Duncan Dull),  January 30, 2010 3:18 PM  Prescriptions: TENNUATE DOSPAN 75 MG TABLET Take 1 tablet by mouth once daily  #30 x 0   Entered by:   Lamona Curl CMA (AAMA)   Authorized by:   Hart Carwin MD   Signed by:   Lamona Curl CMA (AAMA) on 01/30/2010   Method used:   Faxed to ...       [facility] [address] [phone]* (retail)       [address]       [place], [place]  [address]       Ph: [phone] or [phone]       Fax: [phone]   RxID:   [id number] KLONOPIN 1 MG TABS (CLONAZEPAM) Take 1 tablet by mouth two times a day  #60 x 1   Entered by:   Lamona Curl CMA (AAMA)   Authorized by:   Hart Carwin MD   Signed by:   Lamona Curl CMA (AAMA) on 01/30/2010   Method used:   Printed then faxed to ...       [facility] [address] [phone]* (retail)       [address]  [place], [place]  [address]       Ph: [phone] or [phone]       Fax: [phone]   RxID:   [id number]

## 2010-07-04 NOTE — Letter (Signed)
Summary: Colonoscopy Letter  [facility]  [address] [place], [place] [address]   Phone: [phone]  Fax: [phone]      January 25, 2010 MRN: [id number]   [facility] [place] [address] [place], [place]  [address]   Dear [name],   According to your medical record, it is time for you to schedule a Colonoscopy. The American Cancer Society recommends this procedure as a method to detect early colon cancer. Patients with a family history of colon cancer, or a personal history of colon polyps or inflammatory bowel disease are at increased risk.  This letter has beeen generated based on the recommendations made at the time of your procedure. If you feel that in your particular situation this may no longer apply, please contact our office.  Please call our office at [phone] to schedule this appointment or to update your records at your earliest convenience.  Thank you for cooperating with [place] to provide you with the very best care possible.   Sincerely,  Hedwig Morton. [name], M.D.  [facility] [phone]

## 2010-07-04 NOTE — Progress Notes (Signed)
Summary: TRIAGE  Medications Added POTASSIUM CHLORIDE CRYS CR 20 MEQ  CR-TABS (POTASSIUM CHLORIDE CRYS CR) Take 1 tablet by mouth two times a day.       ---- Converted from flag ---- ---- 08/10/2009 8:51 AM, Laureen Ochs LPN wrote: Message left for pt. to have labs drawn this week. She was supposed to have them done around 07-27-09. Did she have them done yet? ------------------------------  Phone Note Outgoing Call   Call placed by: Laureen Ochs LPN,  August 15, 2009 11:46 AM Call placed to: Patient Summary of Call: Msg. left for pt. to please have labs drawn this week. Initial call taken by: Laureen Ochs LPN,  August 15, 2009 11:47 AM  Follow-up for Phone Call        Message left for pt. to have labs drawn this week. Laureen Ochs LPN  August 16, 2009 12:17 PM   1) Pt. states she is getting labs drawn at [facility] on 08-22-09, she will make sure they check her K+ and fax results to [phone].  2) DR.BRODIE-Please clarify pt. K+ order, she states she is supposed to take it two times a day, refill was for once daily.    Follow-up by: Laureen Ochs LPN,  August 16, 2009 2:23 PM  Additional Follow-up for Phone Call Additional follow up Details #1::        Yes, we have increased the K+ to 20 mEq by mouth two times a day, since her last K+ was 3.3. She ought to have #60 tabs of K+ at home to take two times a day till I notify her otherwise. Additional Follow-up by: Hart Carwin MD,  August 16, 2009 10:06 PM    Additional Follow-up for Phone Call Additional follow up Details #2::    Above MD orders reviewed with patient. Corrected script sent to her pharmacy. Pt. instructed to call back as needed.  Follow-up by: Laureen Ochs LPN,  August 17, 2009 8:18 AM  New/Updated Medications: POTASSIUM CHLORIDE CRYS CR 20 MEQ  CR-TABS (POTASSIUM CHLORIDE CRYS CR) Take 1 tablet by mouth two times a day. Prescriptions: POTASSIUM CHLORIDE CRYS CR 20 MEQ  CR-TABS (POTASSIUM CHLORIDE CRYS CR) Take  1 tablet by mouth two times a day.  #60 x 6   Entered by:   Laureen Ochs LPN   Authorized by:   Hart Carwin MD   Signed by:   Laureen Ochs LPN on 29/52/8413   Method used:   Electronically to        [facility] [address] [phone]* (retail)       [name]       [place], [place]  [address]       Ph: [phone] or [phone]       Fax: [phone]   RxID:   [id number]

## 2010-07-04 NOTE — Miscellaneous (Signed)
Summary: [facility]  Clinical Lists Changes  Medications: Added new medication of MIRALAX   POWD (POLYETHYLENE GLYCOL 3350) As per prep  instructions. - Signed Added new medication of DULCOLAX 5 MG  TBEC (BISACODYL) Day before procedure take 2 at 3pm and 2 at 8pm. - Signed Added new medication of REGLAN 10 MG  TABS (METOCLOPRAMIDE HCL) As per prep instructions. - Signed Rx of MIRALAX   POWD (POLYETHYLENE GLYCOL 3350) As per prep  instructions.;  #255gm x 0;  Signed;  Entered by: Ezra Sites RN;  Authorized by: Hart Carwin MD;  Method used: Electronically to [facility] [place]. [phone]*, [address], [place], [place], [place]  [address], Ph: [phone] or [phone], Fax: [phone] Rx of DULCOLAX 5 MG  TBEC (BISACODYL) Day before procedure take 2 at 3pm and 2 at 8pm.;  #4 x 0;  Signed;  Entered by: Ezra Sites RN;  Authorized by: Hart Carwin MD;  Method used: Electronically to [facility] [place]. [phone]*, [address], [place], [place], [place]  [address], Ph: [phone] or [phone], Fax: [phone] Rx of REGLAN 10 MG  TABS (METOCLOPRAMIDE HCL) As per prep instructions.;  #2 x 0;  Signed;  Entered by: Ezra Sites RN;  Authorized by: Hart Carwin MD;  Method used: Electronically to [facility] [place]. [phone]*, [address], [place], [place], [place]  [address], Ph: [phone] or [phone], Fax: [phone] Allergies: Changed allergy or adverse reaction from P.O. IRON to P.O. IRON    Prescriptions: REGLAN 10 MG  TABS (METOCLOPRAMIDE HCL) As per prep instructions.  #2 x 0   Entered by:   Ezra Sites RN   Authorized by:   Hart Carwin MD   Signed by:   Ezra Sites RN on 03/07/2010   Method used:   Electronically to        [facility]. [address] [phone]* (retail)       [address]       [place], [place]  [address]       Ph: [phone] or [phone]       Fax: [phone]   RxID:   [id number] DULCOLAX 5 MG  TBEC (BISACODYL) Day before  procedure take 2 at 3pm and 2 at 8pm.  #4 x 0   Entered by:   Ezra Sites RN   Authorized by:   Hart Carwin MD   Signed by:   Ezra Sites RN on 03/07/2010   Method used:   Electronically to        [facility] [address] [phone]* (retail)       [address]       [place], [place]  [address]       Ph: [phone] or [phone]       Fax: [phone]   RxID:   [id number] MIRALAX   POWD (POLYETHYLENE GLYCOL 3350) As per prep  instructions.  #255gm x 0   Entered by:   Ezra Sites RN   Authorized by:   Hart Carwin MD   Signed by:   Ezra Sites RN on 03/07/2010   Method used:   Electronically to        [facility]. [address] [phone]* (retail)       [address]       [place]  [place], [place]  [address]       Ph: [phone] or [phone]       Fax: [phone]   RxID:   [id number]

## 2010-07-04 NOTE — Progress Notes (Signed)
Summary: fax results  Phone Note Call from Patient Call back at Home Phone [phone]   Caller: Patient Call For: Dr. [name] Reason for Call: Talk to Nurse Summary of Call: would like lab results faxed to [facility], fax # [phone] Initial call taken by: Vallarie Mare,  March 09, 2010 4:29 PM  Follow-up for Phone Call        lab results faxed per patient request. Follow-up by: Lamona Curl CMA Duncan Dull),  March 09, 2010 4:55 PM

## 2010-07-04 NOTE — Letter (Signed)
[facility]: [facility]   Imported By: Sherian Rein 09/26/2009 07:27:49  _____________________________________________________________________  External Attachment:    Type:   Image     Comment:   External Document

## 2010-07-04 NOTE — Progress Notes (Signed)
  Phone Note Other Incoming   Request: Send information Summary of Call: Request for records received from [place], [place], [employer], [place]. C. Request forwarded to [employer].

## 2010-07-04 NOTE — Procedures (Signed)
Summary: Colonoscopy  Patient: [name] Note: All result statuses are Final unless otherwise noted.  Tests: (1) Colonoscopy (COL)   COL Colonoscopy           DONE     [facility]     [address]     [place], [place]  [address]           COLONOSCOPY PROCEDURE REPORT           PATIENT:  Cassandra [name], Cassandra [name]  MR#:  [id number]     BIRTHDATE:  [birth year], 48 yrs. old  GENDER:  female     ENDOSCOPIST:  Hedwig Morton. [name], MD     REF. BY:  Geoffry Paradise, M.D.     PROCEDURE DATE:  03/23/2010     PROCEDURE:  Colonoscopy [address]     ASA CLASS:  Class III     INDICATIONS:  Elevated Risk Screening orthopic liver transplant     1992, abnl LFT's     last colon 2000 normal     MEDICATIONS:   Versed 10 mg, Fentanyl 100 mcg, Benadryl 25 mg           DESCRIPTION OF PROCEDURE:   After the risks benefits and     alternatives of the procedure were thoroughly explained, informed     consent was obtained.  Digital rectal exam was performed and     revealed no rectal masses.   The LB [id number]-[id number] endoscope     was introduced through the anus and advanced to the cecum, which     was identified by the ileocecal valve, without limitations.  The     quality of the prep was good, using MiraLax.  The instrument was     then slowly withdrawn as the colon was fully examined.     <<PROCEDUREIMAGES>>           FINDINGS:  No polyps or cancers were seen (see image1, image2,     image3, image4, and image5).   Retroflexed views in the rectum     revealed no abnormalities.    The scope was then withdrawn from     the patient and the procedure completed.           COMPLICATIONS:  None     ENDOSCOPIC IMPRESSION:     1) No polyps or cancers     2) Normal colonoscopy     RECOMMENDATIONS:     1) high fiber diet     REPEAT EXAM:  In 10 year(s) for.           ______________________________     Hedwig Morton. [name], MD           CC:           n.     eSIGNED:   Hedwig Morton. Brodie at 03/23/2010 10:25 AM           Lavetta Nielsen, [phone]  Note: An exclamation mark (!) indicates a result that was not dispersed into the flowsheet. Document Creation Date: 03/23/2010 10:25 AM _______________________________________________________________________  (1) Order result status: Final Collection or observation date-time: 03/23/2010 10:18 Requested date-time:  Receipt date-time:  Reported date-time:  Referring Physician:   Ordering Physician: Lina Sar [phone]) Specimen Source:  Source: Launa Grill Order Number: [id number] Lab site:   Appended Document: Colonoscopy    Clinical Lists Changes  Observations: Added new observation of COLONNXTDUE: 03/2020 (03/23/2010 10:46)

## 2010-07-04 NOTE — Progress Notes (Signed)
Summary: Medication Questions   Phone Note Call from Patient   Caller: Patient Call For: Dr [name] Summary of Call: Patient has been made aware that we will no longer be giving her refills of Flexeril as we gave her several refills to help with her pain (Dr [name] has indicated that she does not wish to refill any longer). Therefore, if she continues having pain which needs to be controlled she should be further evaluated or get refills by her PCP. Patient would like to know if we can continue to give her klonopin for her depression/anxiety as we did at her last office visit.  Initial call taken by: Lamona Curl CMA Duncan Dull),  September 27, 2009 4:13 PM  Follow-up for Phone Call        Yes, I will continue to give her Klonopin since I started it and she needs it long term. Follow-up by: Hart Carwin MD,  September 28, 2009 12:51 PM  Additional Follow-up for Phone Call Additional follow up Details #1::        Patient has been advised that we will continue to refill klonopin as long as she keeps regular follow up appointments with [place]. Patient verbalizes understanding. She is out of the medication at this time so I will send a new prescription to the pharmacy for her. Additional Follow-up by: Lamona Curl CMA Duncan Dull),  September 28, 2009 12:56 PM    Prescriptions: KLONOPIN 1 MG TABS (CLONAZEPAM) Take 1 tablet by mouth two times a day  #60 x 1   Entered by:   Lamona Curl CMA (AAMA)   Authorized by:   Hart Carwin MD   Signed by:   Lamona Curl CMA (AAMA) on 09/28/2009   Method used:   Printed then faxed to ...       [facility] [address] [phone]* (retail)       [address]       [place], [place]  [address]       Ph: [phone] or [phone]       Fax: [phone]   RxID:   [id number]

## 2010-07-04 NOTE — Progress Notes (Signed)
Summary: Medication refill  Phone Note Call from Patient Call back at Home Phone [phone]   Caller: Patient Call For: Dr. [name] Reason for Call: Talk to Nurse Summary of Call: Needs a refill on her Clonazepam and Diethylpropion.[facility]land KitchenMarland KitchenMarland KitchenW[facility] [address] Initial call taken by: Karna Christmas,  March 07, 2010 10:23 AM  Follow-up for Phone Call        Per Dr Regino Schultze last note, patient needs to wait until she sees Dr [name] for further refills on tennuate dospan. Patient will discuss with Dr [name] at her colonoscopy. I will send her a refill on her clonazepam to the pharmacy. Patient advised. Follow-up by: Lamona Curl CMA (AAMA),  March 07, 2010 11:40 AM    New/Updated Medications: KLONOPIN 1 MG TABS (CLONAZEPAM) Take 1 tablet by mouth two times a day Prescriptions: KLONOPIN 1 MG TABS (CLONAZEPAM) Take 1 tablet by mouth two times a day  #60 x 0   Entered by:   Lamona Curl CMA (AAMA)   Authorized by:   Hart Carwin MD   Signed by:   Lamona Curl CMA (AAMA) on 03/07/2010   Method used:   Printed then faxed to ...       [facility] [address] [phone]* (retail)       [address]       [place], [place]  [address]       Ph: [phone] or [phone]       Fax: [phone]   RxID:   [id number]

## 2010-07-04 NOTE — Letter (Signed)
Summary: [facility]  [facility]   Imported By: Sherian Rein 01/20/2010 14:46:14  _____________________________________________________________________  External Attachment:    Type:   Image     Comment:   External Document

## 2010-07-04 NOTE — Assessment & Plan Note (Signed)
Summary: follow up/lk    History of Present Illness Visit Type: Follow-up Visit Primary GI MD: Lina Sar MD Primary Provider: Pearletha Furl. Jacky Kindle, MD  Requesting Provider: n/a Chief Complaint: F/u for cirrhosis. Pt denies any GI complaints  History of Present Illness:   This is a 50 year old African American femalewho is here for a routine followup. 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 She has chronic renal insufficiency. The last office visit at [facility] transplant center was in March 2011. A liver biopsy was entertained  since  there was  mild elevation of transaminases. Patient remains on prednisone 5 mg a day, Prograf (Tacrilimus) 1 mg 3 tabs every morning and 2 tabs every evening as well as Cellcept 500mg   1.5 tab once daily. She is doing  well. She has occasional gastroesophageal reflux for which she takes Nexium. She is also due for a colonoscopy. Her last exam showed a cecal polyp. Her last colonoscopy was in 2000. Her last upper endoscopy was in 2003.   GI Review of Systems      Denies abdominal pain, acid reflux, belching, bloating, chest pain, dysphagia with liquids, dysphagia with solids, heartburn, loss of appetite, nausea, vomiting, vomiting blood, weight loss, and  weight gain.      Reports liver problems.     Denies anal fissure, black tarry stools, change in bowel habit, constipation, diarrhea, diverticulosis, fecal incontinence, heme positive stool, hemorrhoids, irritable bowel syndrome, jaundice, light color stool, rectal bleeding, and  rectal pain.    Current Medications (verified): 1)  Potassium Chloride Crys Cr 20 Meq  Cr-Tabs (Potassium Chloride Crys Cr) .... Take 1 Tablet By Mouth Two Times A Day. 2)  Prograf 1 Mg Caps (Tacrolimus) .... Take 3 Tablets By Mouth in Morning, 2 Tablets At Night 3)  Nexium 40 Mg Cpdr (Esomeprazole Magnesium) .... Take 1 Tablet By Mouth Two Times A Day 4)  Lipitor 10 Mg Tabs (Atorvastatin Calcium) .... Take 1 Tablet By Mouth  Once A Day 5)  Cellcept 500 Mg Tabs (Mycophenolate Mofetil) .Marland Kitchen.. 1 1/2 Tablet By Mouth Once Daily 6)  Prednisone 5mg  Once Daily .Marland Kitchen.. 1 Tablet By Mouth Once Daily 7)  Percocet 10-325 Mg Tabs (Oxycodone-Acetaminophen) .... As Needed For Pain 8)  Alprazolam 1 Mg Tabs (Alprazolam) .... As Needed 9)  Klonopin 1 Mg Tabs (Clonazepam) .... Take 1 Tablet By Mouth Two Times A Day 10)  Prozac 10 Mg Caps (Fluoxetine Hcl) .... Take 1 Capsule By Mouth Once Daily 11)  Flexeril 10 Mg Tabs (Cyclobenzaprine Hcl) .... Take 1 Tab Twice Daily As Needed. Caution Drowsiness. 12)  Lidoderm 5 % Ptch (Lidocaine) .... Use 1 Patch in 24 Hours For Pain 13)  Promethazine Hcl 25 Mg Tabs (Promethazine Hcl) .... As Needed For Nausea  Allergies (verified): 1)  ! Erythromycin 2)  ! Vioxx 3)  ! Nsaids 4)  ! P.o. Iron  Past History:  Past Medical History: Reviewed history from 05/05/2008 and no changes required. 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 Family History of Diabetes: Brother Family History of Heart Disease: Sister No FH of Colon Cancer:  Social History: Reviewed history from 05/11/2008 and no changes required. Alcohol Use - no Illicit Drug  Use - no Patient has never smoked.   Review of Systems       The patient complains of arthritis/joint pain, back pain, change in vision, headaches-new, and voice change.  The patient denies allergy/sinus, anemia, anxiety-new, blood in urine, breast changes/lumps, confusion, cough, coughing up blood, depression-new, fainting, fatigue,  fever, hearing problems, heart murmur, heart rhythm changes, itching, menstrual pain, muscle pains/cramps, night sweats, nosebleeds, pregnancy symptoms, shortness of breath, skin rash, sleeping problems, sore throat, swelling of feet/legs, swollen lymph glands, thirst - excessive , urination - excessive , urination changes/pain, urine leakage, and vision changes.         Pertinent positive and negative review of systems were noted in the above HPI. All other ROS was otherwise negative.   Vital Signs:  Patient profile:   50 year old female Height:      65 inches Weight:      208 pounds BMI:     34.74 BSA:     2.01 Pulse rate:   96 / minute Pulse rhythm:   regular BP sitting:   132 / 86  (left arm) Cuff size:   regular  Vitals Entered By: Ok Anis CMA (Nov 01, 2009 10:24 AM)  Physical Exam  General:  overweight. Alert and oriented Eyes:  nonicteric. Mouth:  normal oral mucosa. Neck:  Supple; no masses or thyromegaly. Breasts:  large breasts. Lungs:  Clear throughout to auscultation. Heart:  Regular rate and rhythm; no murmurs, rubs,  or bruits. Abdomen:  soft nontender abdomen with well-healed surgical scars. Liver edge and posterior margin. No ascites. No distention. No palpable mass. Extremities:  No clubbing, cyanosis, edema or deformities noted. Skin:  Intact without significant lesions or rashes. Psych:  Alert and cooperative. Normal mood and affect.   Impression & Recommendations:  Problem # 1:  LIVER REPLACED BY TRANSPLANT (ICD-V42.7) Patient is status post orthotopic liver transplant almost 20 years ago. She has mild elevation of transaminases. Patient may require another liver biopsy. Her last biopsy 2 years ago showed no active disease. She needs to continue on her immunosuppressive medications including  prednisone 5 mg daily.Results of her labs are forwarded  to [facility] Transplant coordinator.  Problem # 2:  NONSPECIFIC ABNORM RESULTS KIDNEY FUNCTION STUDY  (ICD-794.4) Patient has mild renal insufficiency. Her last creatinine was 1.5.  Problem # 3:  COLONIC POLYPS, ADENOMATOUS, HX OF (ICD-V12.72) Patient's last colonoscopy was in 2000. She is due for a repeat colonoscopy due to adenomatous polyps and immunosuppression.  Problem # 4:  ANEMIA, IRON DEFICIENCY (ICD-280.9) Patient currently has a normal hemoglobin and hematocrit.  Patient Instructions: 1)  Continue immunosuppressive medications. 2)  Schedule colonoscopy in August 2011. A reminder will be sent. 3)  Nexium 40 mg daily. 4)  [place] Dospan 75 mg daily as an appetite suppressant. 5)  Increase exercise and attempt to lose the 30 pounds. 6)  Copy sent to: Dr Jacky Kindle, Dr A[facility]Smith [facility]  7)  The medication list was reviewed and reconciled.  All changed / newly prescribed medications were explained.  A complete medication list was provided to the patient / caregiver. Prescriptions: TENNUATE DOSPAN 75 MG TABLET Take 1 tablet by mouth once daily  #30 x 3   Entered by:   Lamona Curl CMA (AAMA)   Authorized by:   Hart Carwin MD   Signed by:   Lamona Curl CMA (AAMA) on 11/01/2009   Method used:   Printed then faxed to .Marland KitchenMarland Kitchen  [facility] [address] [phone]* (retail)       [address]       [place], [place]  [address]       Ph: [phone] or [phone]       Fax: [phone]   RxID:   [id number]   Appended Document: follow up/lk Left message for patient to remind her that she is due for colonoscopy at this time.

## 2010-07-06 NOTE — Letter (Signed)
Summary: [facility]  [facility]   Imported By: Lennie Odor 06/14/2010 15:53:08  _____________________________________________________________________  External Attachment:    Type:   Image     Comment:   External Document

## 2010-07-20 NOTE — Letter (Signed)
Summary: [facility]   Imported By: Sherian Rein 07/14/2010 07:26:45  _____________________________________________________________________  External Attachment:    Type:   Image     Comment:   External Document

## 2010-09-04 ENCOUNTER — Other Ambulatory Visit: Payer: Self-pay | Admitting: Internal Medicine

## 2010-10-03 ENCOUNTER — Other Ambulatory Visit: Payer: Self-pay | Admitting: Internal Medicine

## 2010-10-03 DIAGNOSIS — E876 Hypokalemia: Secondary | ICD-10-CM

## 2010-10-04 ENCOUNTER — Other Ambulatory Visit (INDEPENDENT_AMBULATORY_CARE_PROVIDER_SITE_OTHER): Payer: Self-pay

## 2010-10-04 ENCOUNTER — Other Ambulatory Visit: Payer: Self-pay | Admitting: Internal Medicine

## 2010-10-04 DIAGNOSIS — E876 Hypokalemia: Secondary | ICD-10-CM

## 2010-10-04 LAB — ELECTROLYTE PANEL: CO2: 24 mEq/L (ref 19–32)

## 2010-10-04 MED ORDER — ESOMEPRAZOLE MAGNESIUM 40 MG PO CPDR: 40.0000 mg | DELAYED_RELEASE_CAPSULE | Freq: Every day | ORAL | Status: DC

## 2010-10-04 NOTE — Telephone Encounter (Signed)
I have spoken to patient to advise her that I will send a 1 month refill of her clonazepam to the pharmacy (see refill noted dated 10/04/10) but that she needs an office visit for further refills. She has scheduled an appointment for 11/16/10 @ 1:45 pm. I have asked that she make certain to keep this appointment so we can continue to provide medication to her. She has requested Nexium samples as well. She states "I dont know why I dont have any more of the Nexium. I havent had enough in like 2 months" I have explained that from our notes, no prescription was ever sent, we gave her samples only. In 2011 Dr Clinton Sawyer her continuation of Nexium so I have offered to send an rx for the patient. Patient then states, "I already have a prescription. Its just that the pharmacist never counts right so I dont get enough." I have provided her with 3 boxes of samples at her request until she can see Dr [name] to discuss this with her further.

## 2010-10-04 NOTE — Telephone Encounter (Signed)
Called and advised patient that we will give her a one month supply of Potassium, however, she does need to have her levels checked. She states that she will come on Friday between 8-5 to have that drawn. Orders have been entered into the computer.

## 2010-10-05 ENCOUNTER — Telehealth: Payer: Self-pay | Admitting: *Deleted

## 2010-10-05 DIAGNOSIS — E876 Hypokalemia: Secondary | ICD-10-CM

## 2010-10-05 NOTE — Telephone Encounter (Signed)
Message copied by Jesse Fall on Thu Oct 05, 2010  2:12 PM ------      Message from: [place], [place]      Created: Wed Oct 04, 2010 11:53 PM       Please refill pt's K+, needs to continue the same dose. Recheck in  2-3 months

## 2010-10-05 NOTE — Telephone Encounter (Signed)
Patient notified of results. States she has a refill for K+ at this time. Scheduled labs in [facility] for 01/05/11.

## 2010-10-17 NOTE — Assessment & Plan Note (Signed)
[facility]                         GASTROENTEROLOGY OFFICE NOTE   NAME:WHITSETTChaz, Cassandra                    MRN:          [id number]  DATE:06/23/2007                            DOB:          1961/03/02    [name] is a 50 year old female with autoimmune hepatitis, cirrhosis,  status post orthopic liver transplant at [facility] in 1992 on  chronic immunosuppressive therapy with Cellcept and Prograf followed on  a yearly basis at [facility].  She is here today to refill some  of her medications.  She has been complaining of symptoms of upper  respiratory infection for which she took Levaquin, but her symptoms  continue with cough and congestion.  She is taking care of daughter's  baby every day between Monday and Friday, and most likely caught the  infection from her.  She also has a history of anxiety and depression,  C5-6 cervical disk herniation and compromised renal function with  creatinine recently rising to 1.8.  She has been intermittently  overweight and has iron deficiency anemia.  The last liver biopsy  occurred at [facility] in March 2000.  Last colonoscopy in October 2000.  She  may be ready for repeat colonoscopy when she turns 50.   PHYSICAL EXAMINATION:  Blood pressure:  198/68.  Pulse:  72.  Weight:  178 pounds.  She was alert, oriented and in no distress.  Oral cavity  showed no thrush or exudate.  NECK:  Supple, no adenopathy.  LUNGS:  Clear to auscultation.  COR:  Normal S1, S2.  ABDOMEN:  Soft, nontender with multiple healed surgical scars.  Normoactive bowel sounds.  Liver edge at costal margin although abdomen  was normal.  EXTREMITIES:  No edema.   LABORATORY DATA:  Recently showed hemoglobin of 12.1.  Hematocrit 36  with potassium of 3.4.  Creatinine 1.8, it was 1.4 in September 2008  with BUN of 14.  Glucose 111.  Liver function test was completely normal  with alkaline phosphatase of 82, AST of 16, ALT 14 and  albumin of 3.4.  Her tacrolimus, which  FK506 level was 6.9, therapeutic 5-20.   IMPRESSION:  1. [age]-year-old female status post orthopic liver transplant      for autoimmune liver disease in 1992.  Doing well as far as the      liver is concerned.  2. Autoimmune suppression state.  3. Upper respiratory infection.  4. History of depression, anxiety.  5. Recent rise in creatinine from 1.2 to 1.8.   PLAN:  1. Refill for Xanax 1 mg.  2. Z-Pak.  Follow instructions on the package.  3. The patient has an appointment at [facility] in March of this year to      follow up on her liver      disease.  4. She will be ready for a repeat colonoscopy in October 2009.     Hedwig Morton. [name], MD  Electronically Signed    DMB/MedQ  DD: 06/23/2007  DT: 06/23/2007  Job #: [id number]

## 2010-10-17 NOTE — Letter (Signed)
December 24, 2006     RE:  Cassandra [name], YARBOUGH  MRN:  [id number]  /  DOB:  06/07/1960   TO WHOM IT MAY CONCERN:   This is to confirm that [name] has been a patient under my  care since 1984 for chronic liver disease.  She has history of  autoimmune hepatitis and underwent orthoptic liver transplant at [facility] in  1992.  She has been totally disabled on the basis of her liver disease,  and she has been unable to work permanently.  Her total disability is  evaluated on a regular basis.  The patient is being followed in our  office for her disease and continuing of medications.  She also has  yearly visits to [facility].    Sincerely,      Hedwig Morton. [name], MD  Electronically Signed    DMB/MedQ  DD: 12/24/2006  DT: 12/24/2006  Job #: [id number]

## 2010-10-20 NOTE — H&P (Signed)
NAME:  Cassandra [name], MACKOWIAK                       ACCOUNT NO.:  [id number]   MEDICAL RECORD NO.:  [id number]                   PATIENT TYPE:  AMB   LOCATION:  SDC                                  FACILITY:  [facility]   PHYSICIAN:  Roseanna Rainbow, M.D.         DATE OF BIRTH:  14-Sep-1960   DATE OF ADMISSION:  DATE OF DISCHARGE:                                HISTORY & PHYSICAL   CHIEF COMPLAINT:  The patient is a 50 year old African American female with  abnormal uterine bleeding who presents for endometrial cryoablation.   HISTORY OF PRESENT ILLNESS:  The patient gives a long history of  menorrhagia.  She has been diagnosed with endometrial polyps in the past.  She also gives a history of likely secondary iron deficiency anemia.  A  recent attempt was made to control the bleeding with Seasonale however, the  patient is status post a liver transplant and reported worsening of her  liver studies at the [facility].  The Seasonale was  subsequently discontinued.  Work up to date includes an endometrial biopsy  from January 2004 that failed to demonstrate any hyperplasia or malignancy.  She also had a normal  PAP smear.  A pelvic ultrasound from January 2004  revealed a small myomatous uterus.   PAST OBSTETRIC/GYNECOLOGIC HISTORY:  1. She has been pregnant one time and is status post one NSVD.  2. She has a history of Chlamydia and trichomonas and see the history of     present illness.   REVIEW OF SYSTEMS:  Noncontributory.   PAST MEDICAL HISTORY:  1. Liver failure.  2. Renal insufficiency.  3. Ruptured disk.  4. Headache.  5. History of depression and anxiety disorder.   PAST SURGICAL HISTORY:  Liver transplant in 1992.   ALLERGIES:  No known drug allergies.   SOCIAL HISTORY:  She is a widow.  She is disabled.  She denies any  tobacco,  alcohol or substance abuse.   MEDICATIONS:  1. Prograf.  2. CellCept.  3. Prednisone.  4. Xanax.  5. Hydrocodone.  6.  Nexium.   FAMILY HISTORY:  Remarkable for diabetes mellitus and myocardial infarction.   PHYSICAL EXAMINATION:  VITAL SIGNS:  Height 5 feet 5 inches, weight 157  pounds, blood pressure 120/88, pulse 84, respiratory rate 14, temperature  98.4.  GENERAL:  A well-developed, well-nourished, no apparent distress.  NECK:  Supple.  LUNGS:  Clear to auscultation bilaterally.  HEART:  Regular rate and rhythm.  ABDOMEN:  Soft, nontender, nondistended.  PELVIC:  The external female genitalia are normal  appearing.  On speculum  exam the vagina is clean.  On bimanual exam uterus is anteverted,  approximately 10-12 weeks aggregate size, nontender.  The adnexa are not  palpable, nontender bilaterally.   ASSESSMENT:  Small myomatous uterus with secondary menorrhagia.  Poor  candidate for medical management.   PLAN:  The planned procedure is endometrial cryoablation.  The risks,  benefits and alternative forms of management were reviewed with the patient  and informed consent was obtained.  As per her gastroenterologist a PT, PTT  will be obtained two days preoperatively.                                                Roseanna Rainbow, M.D.    Judee Clara  D:  10/09/2002  T:  10/10/2002  Job:  [id number]

## 2010-10-31 ENCOUNTER — Other Ambulatory Visit: Payer: Self-pay | Admitting: *Deleted

## 2010-10-31 ENCOUNTER — Other Ambulatory Visit: Payer: Self-pay | Admitting: Internal Medicine

## 2010-10-31 MED ORDER — POTASSIUM CHLORIDE CRYS ER 20 MEQ PO TBCR: 20.0000 meq | EXTENDED_RELEASE_TABLET | Freq: Two times a day (BID) | ORAL | Status: DC

## 2010-11-16 ENCOUNTER — Ambulatory Visit (INDEPENDENT_AMBULATORY_CARE_PROVIDER_SITE_OTHER): Payer: Medicare Other | Admitting: Internal Medicine

## 2010-11-16 ENCOUNTER — Encounter: Payer: Self-pay | Admitting: Internal Medicine

## 2010-11-16 VITALS — BP 124/62 | HR 88 | Ht 64.0 in | Wt 201.0 lb

## 2010-11-16 DIAGNOSIS — Z944 Liver transplant status: Secondary | ICD-10-CM

## 2010-11-16 DIAGNOSIS — D849 Immunodeficiency, unspecified: Secondary | ICD-10-CM

## 2010-11-16 DIAGNOSIS — K738 Other chronic hepatitis, not elsewhere classified: Secondary | ICD-10-CM

## 2010-11-16 MED ORDER — POTASSIUM CHLORIDE CRYS ER 20 MEQ PO TBCR: 20.0000 meq | EXTENDED_RELEASE_TABLET | Freq: Two times a day (BID) | ORAL | Status: DC

## 2010-11-16 MED ORDER — CLONAZEPAM 1 MG PO TABS: ORAL_TABLET | ORAL | Status: DC

## 2010-11-16 NOTE — Patient Instructions (Addendum)
We will refill Klonopin and Potassium. CC: Dr Jacky Kindle

## 2010-11-16 NOTE — Progress Notes (Signed)
Cassandra [name] 1961/05/10 MRN [id number]      History of Present Illness:  This is a 50 year old African American female who's last office visit was in September 2010. She is status post orthopic liver transplant at [facility] in 1992. His last office visit with Dr. Katrinka Blazing was in March 2012. She is doing very well on Prograf 1 mg 2 tablets in the morning, one at night, CellCept 500 mg, one by mouth twice a day and prednisone 5 mg daily. LFT's have been normal. Dr. Katrinka Blazing indicated there was no need for a repeat liver biopsy. She has a history of iron deficiency anemia. Her last colonoscopy in November 2009 and again in October 2011 was normal. She had adenomatous polyps in 2009 and a shallow ulcer at the ileocecal valve. She has depression and anxiety.   Past Medical History  Diagnosis Date  . Hx of adenomatous colonic polyps   . External hemorrhoids   . Iron deficiency anemia   . Internal hemorrhoids   . Depression   . Anxiety   . Cirrhosis   . Autoimmune hepatitis   . GERD (gastroesophageal reflux disease)   . Fibromyalgia   . Chronic renal insufficiency    Past Surgical History  Procedure Date  . Liver transplant 1992  . Knee arthroscopy   . Rotator cuff repair   . Abdominal hysterectomy     reports that she has never smoked. She does not have any smokeless tobacco history on file. She reports that she does not drink alcohol or use illicit drugs. family history includes Diabetes in her brother and Heart disease in her sister.  There is no history of Colon cancer. Allergies  Allergen Reactions  . Erythromycin     REACTION: Nausea/vomiting  . Nsaids     REACTION: GI Cramping/nausea  . Rofecoxib     REACTION: Nausea/vomiting        Review of Systems: Denies gastroesophageal reflux. Dysphagia odynophagia, shortness of breath or chest pain  The remainder of the 10  point ROS is negative except as outlined in H&P   Physical Exam: General appearance  Well developed, in no  distress. Eyes- non icteric. HEENT nontraumatic, normocephalic. Mouth no lesions, tongue papillated, no cheilosis. Neck supple without adenopathy, thyroid not enlarged, no carotid bruits, no JVD. Lungs Clear to auscultation bilaterally. Cor normal S1 normal S2, regular rhythm , no murmur,  quiet precordium. Abdomen obese abdomen soft with normoactive bowel sounds. No tenderness. Liver edge at costal margin. Well-healed surgical scars. Rectal: Not done. Extremities no pedal edema. Skin no lesions. Neurological alert and oriented x 3. Psychological normal mood and affect.  Assessment and Plan:  Problem #1 Status post liver transplant for autoimmune liver disease. Patient is doing well and has followup at [facility]. She will be due for comprehensive blood testing in the next few weeks.  Problem #2 History of iron deficiency anemia.  Problem #3 History of adenomatous polyps of the colon. She is up-to-date on her colonoscopy.   11/16/2010 Lina Sar

## 2010-11-29 ENCOUNTER — Other Ambulatory Visit: Payer: Self-pay | Admitting: Internal Medicine

## 2010-12-11 ENCOUNTER — Telehealth: Payer: Self-pay | Admitting: *Deleted

## 2010-12-11 ENCOUNTER — Other Ambulatory Visit (INDEPENDENT_AMBULATORY_CARE_PROVIDER_SITE_OTHER): Payer: Medicare Other

## 2010-12-11 DIAGNOSIS — E876 Hypokalemia: Secondary | ICD-10-CM

## 2010-12-11 DIAGNOSIS — K7460 Unspecified cirrhosis of liver: Secondary | ICD-10-CM

## 2010-12-11 LAB — COMPREHENSIVE METABOLIC PANEL
Alkaline Phosphatase: 113 U/L (ref 39–117)
BUN: 17 mg/dL (ref 6–23)
Glucose, Bld: 103 mg/dL — ABNORMAL HIGH (ref 70–99)
Sodium: 141 mEq/L (ref 135–145)
Total Bilirubin: 0.5 mg/dL (ref 0.3–1.2)
Total Protein: 7 g/dL (ref 6.0–8.3)

## 2010-12-11 LAB — CBC WITH DIFFERENTIAL/PLATELET
Basophils Relative: 0.4 % (ref 0.0–3.0)
Eosinophils Relative: 3.1 % (ref 0.0–5.0)
HCT: 39.8 % (ref 36.0–46.0)
Hemoglobin: 13.3 g/dL (ref 12.0–15.0)
Lymphs Abs: 2.4 10*3/uL (ref 0.7–4.0)
MCV: 89.6 fl (ref 78.0–100.0)
Monocytes Absolute: 0.4 10*3/uL (ref 0.1–1.0)
Neutro Abs: 2.8 10*3/uL (ref 1.4–7.7)
RBC: 4.45 Mil/uL (ref 3.87–5.11)
WBC: 5.9 10*3/uL (ref 4.5–10.5)

## 2010-12-11 NOTE — Telephone Encounter (Signed)
Orders entered

## 2010-12-12 ENCOUNTER — Telehealth: Payer: Self-pay | Admitting: *Deleted

## 2010-12-12 NOTE — Telephone Encounter (Signed)
Spoke with patient and she will call [facility] now. Faxed labs to [facility] per patient request.

## 2010-12-12 NOTE — Telephone Encounter (Signed)
Message copied by [name] on Tue Dec 12, 2010  3:25 PM ------      Message from: Hart Carwin      Created: Tue Dec 12, 2010  1:24 PM       Please call pt with low Tacrolimus level and mild elevation of one of the liver tests which may indicate rejection of her transplant. She needs to call [facility] for them to adjust her medications.

## 2010-12-12 NOTE — Telephone Encounter (Signed)
Message copied by [name] on Tue Dec 12, 2010 10:36 AM ------      Message from: Hart Carwin      Created: Mon Dec 11, 2010 10:02 PM       Please call pt with  Satisfactory results, K+  Is slightly low, continue to take daily K+

## 2010-12-12 NOTE — Telephone Encounter (Signed)
Patient notified of lab results. She will continue her Potassium supplement

## 2011-01-03 ENCOUNTER — Telehealth: Payer: Self-pay | Admitting: *Deleted

## 2011-01-03 DIAGNOSIS — E876 Hypokalemia: Secondary | ICD-10-CM

## 2011-01-03 NOTE — Telephone Encounter (Signed)
Patient notified of lab appointment. 

## 2011-01-03 NOTE — Telephone Encounter (Signed)
Message copied by [name] on Wed Jan 03, 2011  8:46 AM ------      Message from: [name]      Created: Thu Oct 05, 2010  2:18 PM       Due for K+ lab on 01/05/11. Call and remind

## 2011-01-10 ENCOUNTER — Other Ambulatory Visit: Payer: Self-pay | Admitting: *Deleted

## 2011-01-10 ENCOUNTER — Other Ambulatory Visit: Payer: Self-pay | Admitting: Internal Medicine

## 2011-01-10 DIAGNOSIS — Z79899 Other long term (current) drug therapy: Secondary | ICD-10-CM

## 2011-01-10 DIAGNOSIS — Z298 Encounter for other specified prophylactic measures: Secondary | ICD-10-CM

## 2011-01-10 DIAGNOSIS — Z944 Liver transplant status: Secondary | ICD-10-CM

## 2011-01-15 ENCOUNTER — Other Ambulatory Visit (INDEPENDENT_AMBULATORY_CARE_PROVIDER_SITE_OTHER): Payer: Medicare Other

## 2011-01-15 ENCOUNTER — Other Ambulatory Visit: Payer: Self-pay | Admitting: Internal Medicine

## 2011-01-15 DIAGNOSIS — Z944 Liver transplant status: Secondary | ICD-10-CM

## 2011-01-15 DIAGNOSIS — Z79899 Other long term (current) drug therapy: Secondary | ICD-10-CM

## 2011-01-15 DIAGNOSIS — E876 Hypokalemia: Secondary | ICD-10-CM

## 2011-01-15 DIAGNOSIS — Z298 Encounter for other specified prophylactic measures: Secondary | ICD-10-CM

## 2011-01-15 LAB — CBC WITH DIFFERENTIAL/PLATELET
Basophils Absolute: 0 10*3/uL (ref 0.0–0.1)
Basophils Relative: 0.5 % (ref 0.0–3.0)
Eosinophils Relative: 3.1 % (ref 0.0–5.0)
HCT: 36.7 % (ref 36.0–46.0)
Hemoglobin: 12.3 g/dL (ref 12.0–15.0)
Lymphocytes Relative: 30.3 % (ref 12.0–46.0)
Lymphs Abs: 1.9 10*3/uL (ref 0.7–4.0)
Monocytes Relative: 10.7 % (ref 3.0–12.0)
Neutro Abs: 3.5 10*3/uL (ref 1.4–7.7)
RBC: 4.13 Mil/uL (ref 3.87–5.11)
RDW: 14.6 % (ref 11.5–14.6)

## 2011-01-15 LAB — COMPREHENSIVE METABOLIC PANEL
ALT: 54 U/L — ABNORMAL HIGH (ref 0–35)
BUN: 14 mg/dL (ref 6–23)
CO2: 27 mEq/L (ref 19–32)
Calcium: 8.9 mg/dL (ref 8.4–10.5)
Creatinine, Ser: 1.4 mg/dL — ABNORMAL HIGH (ref 0.4–1.2)
GFR: 43.26 mL/min — ABNORMAL LOW (ref >60.00)
Total Bilirubin: 0.7 mg/dL (ref 0.3–1.2)

## 2011-01-15 LAB — POTASSIUM: Potassium: 3.7 mEq/L (ref 3.5–5.1)

## 2011-01-16 ENCOUNTER — Telehealth: Payer: Self-pay | Admitting: *Deleted

## 2011-01-16 NOTE — Telephone Encounter (Signed)
Left message for patient to call me. Faxed labs to [facility] at [phone].

## 2011-01-16 NOTE — Telephone Encounter (Signed)
Message copied by [name] on Tue Jan 16, 2011  1:32 PM ------      Message from: Hart Carwin      Created: Tue Jan 16, 2011 12:32 PM       Please call pt with low Tacrolimus levels, she needs to call [facility] liver transplant coordinator, for them to adjust her meds.

## 2011-01-16 NOTE — Telephone Encounter (Signed)
Spoke with patient and she has spoken with [facility]. Will mail patient a copy of her labs per her request.

## 2011-02-06 ENCOUNTER — Other Ambulatory Visit: Payer: Self-pay | Admitting: Internal Medicine

## 2011-02-06 ENCOUNTER — Other Ambulatory Visit (INDEPENDENT_AMBULATORY_CARE_PROVIDER_SITE_OTHER): Payer: Medicare Other

## 2011-02-06 ENCOUNTER — Telehealth: Payer: Self-pay | Admitting: *Deleted

## 2011-02-06 DIAGNOSIS — Z298 Encounter for other specified prophylactic measures: Secondary | ICD-10-CM

## 2011-02-06 DIAGNOSIS — Z944 Liver transplant status: Secondary | ICD-10-CM

## 2011-02-06 DIAGNOSIS — Z79899 Other long term (current) drug therapy: Secondary | ICD-10-CM

## 2011-02-06 LAB — CBC WITH DIFFERENTIAL/PLATELET
Basophils Relative: 0.3 % (ref 0.0–3.0)
Eosinophils Absolute: 0.1 10*3/uL (ref 0.0–0.7)
Hemoglobin: 11.9 g/dL — ABNORMAL LOW (ref 12.0–15.0)
Lymphocytes Relative: 18.9 % (ref 12.0–46.0)
MCHC: 33.3 g/dL (ref 30.0–36.0)
Monocytes Relative: 6 % (ref 3.0–12.0)
Neutro Abs: 5.4 10*3/uL (ref 1.4–7.7)
Neutrophils Relative %: 73.6 % (ref 43.0–77.0)
RBC: 3.97 Mil/uL (ref 3.87–5.11)
WBC: 7.3 10*3/uL (ref 4.5–10.5)

## 2011-02-06 LAB — COMPREHENSIVE METABOLIC PANEL
AST: 40 U/L — ABNORMAL HIGH (ref 0–37)
Albumin: 3.7 g/dL (ref 3.5–5.2)
BUN: 16 mg/dL (ref 6–23)
CO2: 28 mEq/L (ref 19–32)
Calcium: 8.6 mg/dL (ref 8.4–10.5)
Chloride: 106 mEq/L (ref 96–112)
Creatinine, Ser: 1.4 mg/dL — ABNORMAL HIGH (ref 0.4–1.2)
GFR: 43.99 mL/min — ABNORMAL LOW (ref >60.00)
Glucose, Bld: 127 mg/dL — ABNORMAL HIGH (ref 70–99)
Potassium: 3.7 mEq/L (ref 3.5–5.1)

## 2011-02-06 NOTE — Telephone Encounter (Signed)
Patient notified of results. Results faxed to [facility] [phone]. Mailed results to patient also per her request.

## 2011-02-06 NOTE — Telephone Encounter (Signed)
Message copied by [name] on Tue Feb 06, 2011  3:18 PM ------      Message from: Hart Carwin      Created: Tue Feb 06, 2011  2:41 PM       Please call pt with abnormal LFT's, she needs to  see her liver transplant doctors at [facility].

## 2011-02-08 ENCOUNTER — Telehealth: Payer: Self-pay | Admitting: *Deleted

## 2011-02-08 NOTE — Telephone Encounter (Signed)
Message copied by [name] on Thu Feb 08, 2011  2:22 PM ------      Message from: Hart Carwin      Created: Thu Feb 08, 2011 12:48 PM       Please call pt , her Tacrolimus level is now back to normal

## 2011-02-08 NOTE — Telephone Encounter (Signed)
Patient given results. Faxed results to [facility] and mailed a copy to patient.

## 2011-02-20 ENCOUNTER — Other Ambulatory Visit: Payer: Self-pay | Admitting: Internal Medicine

## 2011-02-27 ENCOUNTER — Other Ambulatory Visit: Payer: Self-pay | Admitting: Internal Medicine

## 2011-02-28 ENCOUNTER — Other Ambulatory Visit: Payer: Self-pay | Admitting: Internal Medicine

## 2011-02-28 NOTE — Telephone Encounter (Signed)
OK to send a prescription for Nexiem 40 mg, #30, 1 po qd, 6 refills

## 2011-02-28 NOTE — Telephone Encounter (Signed)
Clonazepam was already sent to the pharmacy on 02/27/11. Dr [name], patient is requesting Nexium samples (see 10/04/10 refill note for previous discussion). Can I send her an rx for Nexium? I dont see where one has ever been sent for her.

## 2011-03-01 ENCOUNTER — Other Ambulatory Visit: Payer: Self-pay | Admitting: Internal Medicine

## 2011-03-01 ENCOUNTER — Encounter: Payer: Self-pay | Admitting: Internal Medicine

## 2011-03-01 MED ORDER — ESOMEPRAZOLE MAGNESIUM 40 MG PO CPDR: 40.0000 mg | DELAYED_RELEASE_CAPSULE | Freq: Every day | ORAL | Status: DC

## 2011-03-01 NOTE — Telephone Encounter (Signed)
I have spoken with patient and advised that the clonazepam was already sent to the pharmacy on 02/27/11. I have also advised that we do not have Nexium samples available for her at this time but a new script has been sent to the pharmacy. Patient verbalizes understanding.

## 2011-03-05 LAB — CBC
HCT: 29.6 — ABNORMAL LOW
Hemoglobin: 10 — ABNORMAL LOW
MCHC: 33.6
RBC: 3.23 — ABNORMAL LOW

## 2011-03-05 LAB — COMPREHENSIVE METABOLIC PANEL
ALT: 18
Alkaline Phosphatase: 88
BUN: 13
CO2: 22
GFR calc non Af Amer: 34 — ABNORMAL LOW
Glucose, Bld: 131 — ABNORMAL HIGH
Potassium: 3.7
Sodium: 137

## 2011-03-13 ENCOUNTER — Other Ambulatory Visit: Payer: Self-pay | Admitting: Internal Medicine

## 2011-03-13 ENCOUNTER — Other Ambulatory Visit (INDEPENDENT_AMBULATORY_CARE_PROVIDER_SITE_OTHER): Payer: Medicare Other

## 2011-03-13 DIAGNOSIS — Z2989 Encounter for other specified prophylactic measures: Secondary | ICD-10-CM

## 2011-03-13 DIAGNOSIS — Z79899 Other long term (current) drug therapy: Secondary | ICD-10-CM

## 2011-03-13 DIAGNOSIS — Z944 Liver transplant status: Secondary | ICD-10-CM

## 2011-03-13 DIAGNOSIS — Z298 Encounter for other specified prophylactic measures: Secondary | ICD-10-CM

## 2011-03-13 LAB — CBC WITH DIFFERENTIAL/PLATELET
Basophils Absolute: 0 10*3/uL (ref 0.0–0.1)
Eosinophils Absolute: 0.2 10*3/uL (ref 0.0–0.7)
Eosinophils Relative: 2.2 % (ref 0.0–5.0)
HCT: 42.4 % (ref 36.0–46.0)
Lymphs Abs: 2.8 10*3/uL (ref 0.7–4.0)
MCHC: 33 g/dL (ref 30.0–36.0)
MCV: 89.5 fl (ref 78.0–100.0)
Monocytes Absolute: 0.7 10*3/uL (ref 0.1–1.0)
Platelets: 197 10*3/uL (ref 150.0–400.0)
RDW: 14 % (ref 11.5–14.6)

## 2011-03-13 LAB — COMPREHENSIVE METABOLIC PANEL
ALT: 34 U/L (ref 0–35)
AST: 19 U/L (ref 0–37)
Alkaline Phosphatase: 142 U/L — ABNORMAL HIGH (ref 39–117)
Sodium: 144 mEq/L (ref 135–145)
Total Bilirubin: 0.6 mg/dL (ref 0.3–1.2)
Total Protein: 7.5 g/dL (ref 6.0–8.3)

## 2011-03-14 ENCOUNTER — Telehealth: Payer: Self-pay | Admitting: *Deleted

## 2011-03-14 DIAGNOSIS — R945 Abnormal results of liver function studies: Secondary | ICD-10-CM

## 2011-03-14 DIAGNOSIS — Z944 Liver transplant status: Secondary | ICD-10-CM

## 2011-03-14 LAB — TACROLIMUS LEVEL: Tacrolimus Lvl: 3.5 ng/mL — ABNORMAL LOW (ref 5.0–20.0)

## 2011-03-14 NOTE — Telephone Encounter (Signed)
Message copied by [name] on Wed Mar 14, 2011 11:52 AM ------      Message from: Hart Carwin      Created: Wed Mar 14, 2011 11:36 AM       Please schedule upper abd. Ultrasound wirt attention to liver. Her LFT's are better but the alk ph is still elevated

## 2011-03-14 NOTE — Telephone Encounter (Signed)
Scheduled at [facility] for abdominal ultrasound on 03/16/11 at 9:30 AM(Anna) NPO after midnight. Patient aware of lab results and ultrasound. Lab results faxed to [facility].

## 2011-03-16 ENCOUNTER — Telehealth: Payer: Self-pay | Admitting: *Deleted

## 2011-03-16 ENCOUNTER — Ambulatory Visit (HOSPITAL_COMMUNITY)
Admission: RE | Admit: 2011-03-16 | Discharge: 2011-03-16 | Disposition: A | Discharge status: Home / Self Care | Payer: Medicare Other | Source: Ambulatory Visit | Attending: Internal Medicine | Admitting: Internal Medicine

## 2011-03-16 DIAGNOSIS — Z9089 Acquired absence of other organs: Secondary | ICD-10-CM | POA: Insufficient documentation

## 2011-03-16 DIAGNOSIS — R945 Abnormal results of liver function studies: Secondary | ICD-10-CM

## 2011-03-16 DIAGNOSIS — Z944 Liver transplant status: Secondary | ICD-10-CM | POA: Insufficient documentation

## 2011-03-16 DIAGNOSIS — R748 Abnormal levels of other serum enzymes: Secondary | ICD-10-CM | POA: Insufficient documentation

## 2011-03-16 DIAGNOSIS — R7989 Other specified abnormal findings of blood chemistry: Secondary | ICD-10-CM | POA: Insufficient documentation

## 2011-03-16 NOTE — Telephone Encounter (Signed)
Left a message for patient with results on voice mail.

## 2011-03-16 NOTE — Telephone Encounter (Signed)
Left a message for patient with results.Faxed results to [facility].

## 2011-03-16 NOTE — Telephone Encounter (Signed)
Message copied by [name] on Fri Mar 16, 2011  9:38 AM ------      Message from: Hart Carwin      Created: Thu Mar 15, 2011  5:07 PM       Please call pt with  Slightly low Tacrilimus levels, but higher than last time. The results have been sent to [facility] for them to decide if dose change indicated.

## 2011-03-16 NOTE — Telephone Encounter (Signed)
Message copied by [name] on Fri Mar 16, 2011  4:25 PM ------      Message from: Hart Carwin      Created: Fri Mar 16, 2011  4:24 PM       Please call pt with normal ultrasound of the liver

## 2011-03-22 NOTE — Telephone Encounter (Signed)
Error

## 2011-04-24 ENCOUNTER — Other Ambulatory Visit: Payer: Self-pay | Admitting: Internal Medicine

## 2011-05-01 ENCOUNTER — Other Ambulatory Visit: Payer: Self-pay | Admitting: Internal Medicine

## 2011-05-04 ENCOUNTER — Telehealth: Payer: Self-pay | Admitting: Internal Medicine

## 2011-05-04 ENCOUNTER — Other Ambulatory Visit: Payer: Self-pay | Admitting: Internal Medicine

## 2011-05-04 ENCOUNTER — Other Ambulatory Visit (INDEPENDENT_AMBULATORY_CARE_PROVIDER_SITE_OTHER): Payer: Medicare Other

## 2011-05-04 DIAGNOSIS — Z944 Liver transplant status: Secondary | ICD-10-CM

## 2011-05-04 DIAGNOSIS — Z298 Encounter for other specified prophylactic measures: Secondary | ICD-10-CM

## 2011-05-04 DIAGNOSIS — Z79899 Other long term (current) drug therapy: Secondary | ICD-10-CM

## 2011-05-04 LAB — CBC WITH DIFFERENTIAL/PLATELET
Basophils Relative: 0.4 % (ref 0.0–3.0)
Eosinophils Relative: 3.9 % (ref 0.0–5.0)
Hemoglobin: 13.1 g/dL (ref 12.0–15.0)
Lymphocytes Relative: 37.4 % (ref 12.0–46.0)
MCV: 89.5 fl (ref 78.0–100.0)
Neutro Abs: 3.7 10*3/uL (ref 1.4–7.7)
Neutrophils Relative %: 51.7 % (ref 43.0–77.0)
RBC: 4.37 Mil/uL (ref 3.87–5.11)
WBC: 7.3 10*3/uL (ref 4.5–10.5)

## 2011-05-04 LAB — COMPREHENSIVE METABOLIC PANEL
Albumin: 3.8 g/dL (ref 3.5–5.2)
Alkaline Phosphatase: 126 U/L — ABNORMAL HIGH (ref 39–117)
BUN: 14 mg/dL (ref 6–23)
Calcium: 8.9 mg/dL (ref 8.4–10.5)
Creatinine, Ser: 1.4 mg/dL — ABNORMAL HIGH (ref 0.4–1.2)
Glucose, Bld: 101 mg/dL — ABNORMAL HIGH (ref 70–99)
Potassium: 3.7 mEq/L (ref 3.5–5.1)

## 2011-05-04 NOTE — Telephone Encounter (Signed)
Will send once Dr [name] reviews

## 2011-05-07 ENCOUNTER — Telehealth: Payer: Self-pay | Admitting: Internal Medicine

## 2011-05-07 ENCOUNTER — Telehealth: Payer: Self-pay | Admitting: *Deleted

## 2011-05-07 DIAGNOSIS — K754 Autoimmune hepatitis: Secondary | ICD-10-CM

## 2011-05-07 DIAGNOSIS — K7460 Unspecified cirrhosis of liver: Secondary | ICD-10-CM

## 2011-05-07 NOTE — Telephone Encounter (Signed)
Advised patient that she will need to be fasting at least 6 hours before lipid panel. Patient verbalizes understanding.

## 2011-05-07 NOTE — Telephone Encounter (Signed)
Patient states that she contacted [facility] and they have advised her not to have labwork with [place] until she gets her  "flu" under control. She is apparently on antibiotics from the Urgent Care. She also requests that we send a copy of her labwork to her home address. I have done this per patient request.

## 2011-05-07 NOTE — Telephone Encounter (Signed)
Patient advised of elevated lft's. She states that she will come this week for additional labs as requested by Dr [name].

## 2011-05-07 NOTE — Telephone Encounter (Signed)
Please ask pt if [facility] is going to be ordering her blood tests. How do we know when to draw next set of blood rests? DB

## 2011-05-07 NOTE — Telephone Encounter (Signed)
Message copied by Richardson Chiquito on Mon May 07, 2011  8:36 AM ------      Message from: Hart Carwin      Created: Sat May 05, 2011  2:29 PM       Please call pt with mildly elevated LFT's, last appointm at [facility] 09/2010. Please have her come for ANA, anti DNA, IgG. [facility] DB

## 2011-05-08 NOTE — Telephone Encounter (Signed)
Patient states that she will have labs with [place] after she gets well.

## 2011-06-12 ENCOUNTER — Other Ambulatory Visit (INDEPENDENT_AMBULATORY_CARE_PROVIDER_SITE_OTHER): Payer: Medicare Other

## 2011-06-12 ENCOUNTER — Other Ambulatory Visit: Payer: Self-pay | Admitting: Internal Medicine

## 2011-06-12 DIAGNOSIS — Z298 Encounter for other specified prophylactic measures: Secondary | ICD-10-CM

## 2011-06-12 DIAGNOSIS — Z79899 Other long term (current) drug therapy: Secondary | ICD-10-CM

## 2011-06-12 DIAGNOSIS — Z944 Liver transplant status: Secondary | ICD-10-CM

## 2011-06-12 DIAGNOSIS — K7460 Unspecified cirrhosis of liver: Secondary | ICD-10-CM

## 2011-06-12 DIAGNOSIS — K754 Autoimmune hepatitis: Secondary | ICD-10-CM

## 2011-06-12 LAB — CBC WITH DIFFERENTIAL/PLATELET
Eosinophils Relative: 1.1 % (ref 0.0–5.0)
Lymphocytes Relative: 33 % (ref 12.0–46.0)
Monocytes Absolute: 0.5 10*3/uL (ref 0.1–1.0)
Monocytes Relative: 6.8 % (ref 3.0–12.0)
Neutrophils Relative %: 58.7 % (ref 43.0–77.0)
Platelets: 162 10*3/uL (ref 150.0–400.0)
WBC: 7 10*3/uL (ref 4.5–10.5)

## 2011-06-12 LAB — COMPREHENSIVE METABOLIC PANEL
Albumin: 3.9 g/dL (ref 3.5–5.2)
CO2: 25 mEq/L (ref 19–32)
Calcium: 8.7 mg/dL (ref 8.4–10.5)
GFR: 50.33 mL/min — ABNORMAL LOW (ref >60.00)
Glucose, Bld: 102 mg/dL — ABNORMAL HIGH (ref 70–99)
Potassium: 3.8 mEq/L (ref 3.5–5.1)
Sodium: 142 mEq/L (ref 135–145)
Total Protein: 6.9 g/dL (ref 6.0–8.3)

## 2011-06-12 LAB — LIPID PANEL
Total CHOL/HDL Ratio: 3
Triglycerides: 66 mg/dL (ref 0.0–149.0)

## 2011-06-13 ENCOUNTER — Telehealth: Payer: Self-pay | Admitting: *Deleted

## 2011-06-13 LAB — ANTI-NUCLEAR AB-TITER (ANA TITER)

## 2011-06-13 LAB — ANTI-DNA ANTIBODY, DOUBLE-STRANDED: ds DNA Ab: 7 IU/mL (ref <30)

## 2011-06-13 LAB — ANA: Anti Nuclear Antibody(ANA): POSITIVE — AB

## 2011-06-13 NOTE — Telephone Encounter (Signed)
Patient given results as per Dr. Brodie 

## 2011-06-13 NOTE — Telephone Encounter (Signed)
Message copied by [name] on Wed Jun 13, 2011 10:16 AM ------      Message from: Hart Carwin      Created: Tue Jun 12, 2011  9:32 PM       Please call pt with stable blood tests, normal CBC, mild elevation of LFT's unchanged, renal function unchanged.

## 2011-06-14 ENCOUNTER — Telehealth: Payer: Self-pay | Admitting: *Deleted

## 2011-06-14 NOTE — Telephone Encounter (Signed)
Patient given results as per Dr. [name]. Faxed labs to [facility] at [phone]. Patient has a new address [address] [place], [place] [address].

## 2011-06-14 NOTE — Telephone Encounter (Signed)
Message copied by [name] on Thu Jun 14, 2011  9:10 AM ------      Message from: Hart Carwin      Created: Wed Jun 13, 2011  7:14 PM       Please call pt, her Tacrolimus levels are now in therapeutic range

## 2011-06-29 ENCOUNTER — Other Ambulatory Visit: Payer: Self-pay | Admitting: Gastroenterology

## 2011-07-02 ENCOUNTER — Other Ambulatory Visit: Payer: Self-pay | Admitting: Internal Medicine

## 2011-07-16 ENCOUNTER — Telehealth: Payer: Self-pay | Admitting: Internal Medicine

## 2011-07-16 NOTE — Telephone Encounter (Signed)
Patient calling asking for an rx to be called in for a humidifier because she has gas heat so she can get it with her medical reimbur. Card. States she does not have a PCP to order this. Please, advise.

## 2011-07-16 NOTE — Telephone Encounter (Signed)
OK to order a humidifier for her for medical reasons. She is s/p liver transplant, immunosupressed, can catch pneumonia/asthma, needs a humidifier.

## 2011-07-16 NOTE — Telephone Encounter (Signed)
Rx called to [facility] on [address]. Patient aware.

## 2011-08-29 ENCOUNTER — Other Ambulatory Visit: Payer: Self-pay | Admitting: Internal Medicine

## 2011-09-11 ENCOUNTER — Telehealth: Payer: Self-pay | Admitting: Internal Medicine

## 2011-09-11 NOTE — Telephone Encounter (Signed)
Called pt with regards to obtaining a PCP,  She does not have any. Geographically  It would be convenient to come to our building, female MD- suggested Dr Felicity Coyer. She has some other name. She will call us in next 7 days with the name of a PCP.

## 2011-09-24 ENCOUNTER — Other Ambulatory Visit: Payer: Self-pay | Admitting: Internal Medicine

## 2011-09-26 ENCOUNTER — Telehealth: Payer: Self-pay | Admitting: Internal Medicine

## 2011-09-26 NOTE — Telephone Encounter (Signed)
Patient states that she has decided that she would like to see Dr Felicity Coyer as per Dr Regino Schultze recommendations. I have attempted to reach Dr Diamantina Monks office for an appointment but was unable to get anyone to answer the phone. I will call back shortly to see if I have better luck at that time.

## 2011-09-26 NOTE — Telephone Encounter (Signed)
Dr Felicity Coyer is apparently booked out into October right now. Dr Oliver Barre can see patient in June. Patient is agreeable to this. Patient scheduled for June, 12 @ 9:30 am. I have asked that patient bring copay, insurance cards, all medications to her appointment. I have also advised her of the cancellation/no show policy and she verbalizes understanding of this as well.

## 2011-10-26 ENCOUNTER — Other Ambulatory Visit: Payer: Self-pay | Admitting: Internal Medicine

## 2011-10-30 ENCOUNTER — Other Ambulatory Visit: Payer: Self-pay | Admitting: Internal Medicine

## 2011-10-30 MED ORDER — POTASSIUM CHLORIDE CRYS ER 20 MEQ PO TBCR: EXTENDED_RELEASE_TABLET | ORAL | Status: DC

## 2011-10-30 MED ORDER — CLONAZEPAM 1 MG PO TABS: ORAL_TABLET | ORAL | Status: DC

## 2011-10-30 NOTE — Telephone Encounter (Signed)
rx sent until appt on 12-04-11.

## 2011-11-14 ENCOUNTER — Ambulatory Visit: Payer: Medicare Other | Admitting: Internal Medicine

## 2011-11-19 ENCOUNTER — Telehealth: Payer: Self-pay | Admitting: Internal Medicine

## 2011-11-19 DIAGNOSIS — R0602 Shortness of breath: Secondary | ICD-10-CM

## 2011-11-19 NOTE — Telephone Encounter (Signed)
Please obtain CXR ,PA and lat ,

## 2011-11-19 NOTE — Telephone Encounter (Signed)
Patient calling to report 2 different times when she went outside and it was humid, she had SOB. She thinks she needs to see a pulmonary MD. Told patient if she has anymore episodes to seek care at ED or urgent care. ( she does not have a PCP currently) She is scheduled to see Dr. Jonny Ruiz on 12/13/11 to establish care.

## 2011-11-20 ENCOUNTER — Other Ambulatory Visit (INDEPENDENT_AMBULATORY_CARE_PROVIDER_SITE_OTHER): Payer: Medicare Other

## 2011-11-20 ENCOUNTER — Other Ambulatory Visit: Payer: Self-pay | Admitting: Internal Medicine

## 2011-11-20 ENCOUNTER — Ambulatory Visit (INDEPENDENT_AMBULATORY_CARE_PROVIDER_SITE_OTHER)
Admission: RE | Admit: 2011-11-20 | Discharge: 2011-11-20 | Disposition: A | Discharge status: Home / Self Care | Payer: Medicare Other | Source: Ambulatory Visit | Attending: Internal Medicine | Admitting: Internal Medicine

## 2011-11-20 DIAGNOSIS — Z79899 Other long term (current) drug therapy: Secondary | ICD-10-CM

## 2011-11-20 DIAGNOSIS — Z298 Encounter for other specified prophylactic measures: Secondary | ICD-10-CM

## 2011-11-20 DIAGNOSIS — Z944 Liver transplant status: Secondary | ICD-10-CM

## 2011-11-20 DIAGNOSIS — R0602 Shortness of breath: Secondary | ICD-10-CM

## 2011-11-20 LAB — CBC WITH DIFFERENTIAL/PLATELET
Basophils Absolute: 0.1 10*3/uL (ref 0.0–0.1)
Eosinophils Absolute: 0.2 10*3/uL (ref 0.0–0.7)
HCT: 38.3 % (ref 36.0–46.0)
Lymphs Abs: 1.5 10*3/uL (ref 0.7–4.0)
MCHC: 33.5 g/dL (ref 30.0–36.0)
MCV: 88.5 fl (ref 78.0–100.0)
Monocytes Absolute: 0.5 10*3/uL (ref 0.1–1.0)
Platelets: 143 10*3/uL — ABNORMAL LOW (ref 150.0–400.0)
RDW: 13.9 % (ref 11.5–14.6)

## 2011-11-20 LAB — COMPREHENSIVE METABOLIC PANEL
ALT: 33 U/L (ref 0–35)
Alkaline Phosphatase: 95 U/L (ref 39–117)
CO2: 23 mEq/L (ref 19–32)
GFR: 39.44 mL/min — ABNORMAL LOW (ref >60.00)
Sodium: 140 mEq/L (ref 135–145)
Total Bilirubin: 0.3 mg/dL (ref 0.3–1.2)
Total Protein: 6.9 g/dL (ref 6.0–8.3)

## 2011-11-20 NOTE — Telephone Encounter (Signed)
Spoke with patient and she will come and get CXR.

## 2011-11-21 LAB — TACROLIMUS LEVEL: Tacrolimus Lvl: 2.6 ng/mL — ABNORMAL LOW (ref 5.0–20.0)

## 2011-12-04 ENCOUNTER — Ambulatory Visit (INDEPENDENT_AMBULATORY_CARE_PROVIDER_SITE_OTHER): Payer: Medicare Other | Admitting: Internal Medicine

## 2011-12-04 ENCOUNTER — Encounter: Payer: Self-pay | Admitting: Internal Medicine

## 2011-12-04 VITALS — BP 132/90 | HR 92 | Ht 64.0 in | Wt 200.2 lb

## 2011-12-04 DIAGNOSIS — Z944 Liver transplant status: Secondary | ICD-10-CM

## 2011-12-04 DIAGNOSIS — Z79899 Other long term (current) drug therapy: Secondary | ICD-10-CM

## 2011-12-04 DIAGNOSIS — D849 Immunodeficiency, unspecified: Secondary | ICD-10-CM

## 2011-12-04 NOTE — Progress Notes (Signed)
Cassandra [name] [birth year] MRN [id number]  History of Present Illness:  This is a 51 year old, African American female who is here for a followup visit. 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 She has been followed by Dr. Katrinka Blazing at [facility]. Her last appointment was in April 2013. She is on CellCept 500 mg twice a day and Prograf 2 mg in the morning and 1 mg at bedtime as well as prednisone 5 mg daily. Her last liver function tests last month were normal. Her last liver biopsy was in the spring of 2009. She has a history of iron deficiency anemia. Her hemoglobin was 12.8 and hematocrit was 38.3, her ANA titer is positive at 1:160 titer, IgG level 1.03 g. Anti-DNA was negative. Her last upper abdominal endoscopy in 2003 was normal. Her last colonoscopy was in November 2009 and it showed an ulcer at the ileocecal valve likely due to anti-inflammatory agents and she had an adenomatous polyp.   Past Medical History  Diagnosis Date  . Hx of adenomatous colonic polyps   . External hemorrhoids   . Iron deficiency anemia   . Internal hemorrhoids   . Depression   . Anxiety   . Cirrhosis   . Autoimmune hepatitis   . GERD (gastroesophageal reflux disease)   . Fibromyalgia   . Chronic renal insufficiency    Past Surgical History  Procedure Date  . Liver transplant 1992  . Knee arthroscopy   . Rotator cuff repair   . Abdominal hysterectomy     reports that she has never smoked. She does not have any smokeless tobacco history on file. She reports that she does not drink alcohol or use illicit drugs. family history includes Diabetes in her brother and Heart disease in her sister.  There is no history of Colon cancer. Allergies  Allergen Reactions  . Erythromycin     REACTION: Nausea/vomiting  . Nsaids     REACTION: GI Cramping/nausea  . Rofecoxib     REACTION: Nausea/vomiting        Review of Systems: Denies heartburn dysphagia abdominal pain  change in bowel habits  The remainder of the 10 point ROS is negative except as outlined in H&P   Physical Exam: General appearance  Well developed, in no distress overweight. Eyes- non icteric. HEENT nontraumatic, normocephalic. Mouth no lesions, tongue papillated, no cheilosis. Neck supple without adenopathy, thyroid not enlarged, no carotid bruits, no JVD. Lungs Clear to auscultation bilaterally. Cor normal S1, normal S2, regular rhythm, no murmur,  quiet precordium. Abdomen: Large soft nontender with well-healed surgical scar. No ascites. Liver edge at costal margin. Splenic tip not palpable. Rectal: Soft Hemoccult negative stool. Extremities no pedal edema. Skin no lesions. Neurological alert and oriented x 3. No asterixis Psychological normal mood and affect.  Assessment and Plan:  Problem #1 Status post orthotopic liver transplant 21 years ago at [facility] for autoimmune liver disease. She is doing very well on an immunosuppressive regimen which needs to be continued indefinitely. There is no evidence of rejection. Her level was 2.6 which is slightly below therapeutic level. She needs a followup upper abdominal ultrasound of the liver.  Problem #2 History of iron deficiency anemia. Patient is doing well with a hemoglobin of 12.8. She is up-to-date on her colonoscopy. Her next exam will be due in November 2014.  Problem #3 General medical care. Patient needs a primary care physician. She has an appointment with Dr. Jonny Ruiz.  12/04/2011 Lina Sar

## 2011-12-04 NOTE — Patient Instructions (Addendum)
You have been scheduled for an abdominal ultrasound at [facility] (1st floor of hospital) on 12/12/11 at 9:00 am. Please arrive 15 minutes prior to your appointment for registration. Make certain not to have anything to eat or drink 6 hours prior to your appointment. Should you need to reschedule your appointment, please contact radiology at [phone]. CC: Oliver Barre, MD, Dr Burtis Junes

## 2011-12-06 ENCOUNTER — Encounter: Payer: Self-pay | Admitting: Internal Medicine

## 2011-12-06 DIAGNOSIS — K219 Gastro-esophageal reflux disease without esophagitis: Secondary | ICD-10-CM | POA: Insufficient documentation

## 2011-12-06 DIAGNOSIS — F329 Major depressive disorder, single episode, unspecified: Secondary | ICD-10-CM | POA: Insufficient documentation

## 2011-12-06 DIAGNOSIS — Z0000 Encounter for general adult medical examination without abnormal findings: Secondary | ICD-10-CM | POA: Insufficient documentation

## 2011-12-06 DIAGNOSIS — F32A Depression, unspecified: Secondary | ICD-10-CM | POA: Insufficient documentation

## 2011-12-12 ENCOUNTER — Telehealth: Payer: Self-pay | Admitting: *Deleted

## 2011-12-12 ENCOUNTER — Ambulatory Visit (HOSPITAL_COMMUNITY)
Admission: RE | Admit: 2011-12-12 | Discharge: 2011-12-12 | Disposition: A | Discharge status: Home / Self Care | Payer: Medicare Other | Source: Ambulatory Visit | Attending: Internal Medicine | Admitting: Internal Medicine

## 2011-12-12 DIAGNOSIS — K838 Other specified diseases of biliary tract: Secondary | ICD-10-CM | POA: Insufficient documentation

## 2011-12-12 DIAGNOSIS — Z944 Liver transplant status: Secondary | ICD-10-CM | POA: Insufficient documentation

## 2011-12-12 DIAGNOSIS — K8020 Calculus of gallbladder without cholecystitis without obstruction: Secondary | ICD-10-CM

## 2011-12-12 DIAGNOSIS — Q619 Cystic kidney disease, unspecified: Secondary | ICD-10-CM | POA: Insufficient documentation

## 2011-12-12 NOTE — Telephone Encounter (Signed)
Message copied by Florene Glen on Wed Dec 12, 2011  2:40 PM ------      Message from: Hart Carwin      Created: Wed Dec 12, 2011 12:32 PM       Please call pt with the results of the ultrasound. The liver looks OK but the CBD has enlarged since the last imaging . We need to do a MRCP to rule out stone in the bile dict. Please schedule for an MRCP. " post liver transplant, new ly dilated CBD, normal LFT's"

## 2011-12-12 NOTE — Telephone Encounter (Signed)
Unable to reach pt d/t no mail box set up on home phone. Wrote a letter for pt and I will ask Dr Raphael Gibney CMA to give it to pt tomorrow at his appt at 0930am tomorrow. Left the letter at the front desk of Primary Care for appt tomorrow.

## 2011-12-13 ENCOUNTER — Other Ambulatory Visit (INDEPENDENT_AMBULATORY_CARE_PROVIDER_SITE_OTHER): Payer: Medicare Other

## 2011-12-13 ENCOUNTER — Encounter: Payer: Self-pay | Admitting: Internal Medicine

## 2011-12-13 ENCOUNTER — Ambulatory Visit (INDEPENDENT_AMBULATORY_CARE_PROVIDER_SITE_OTHER): Payer: Medicare Other | Admitting: Internal Medicine

## 2011-12-13 VITALS — BP 110/78 | HR 86 | Temp 98.4°F | Ht 64.0 in | Wt 201.1 lb

## 2011-12-13 DIAGNOSIS — Z0000 Encounter for general adult medical examination without abnormal findings: Secondary | ICD-10-CM

## 2011-12-13 DIAGNOSIS — R7309 Other abnormal glucose: Secondary | ICD-10-CM

## 2011-12-13 DIAGNOSIS — R7302 Impaired glucose tolerance (oral): Secondary | ICD-10-CM

## 2011-12-13 DIAGNOSIS — Z79899 Other long term (current) drug therapy: Secondary | ICD-10-CM

## 2011-12-13 LAB — LIPID PANEL: Total CHOL/HDL Ratio: 3

## 2011-12-13 LAB — BASIC METABOLIC PANEL
Calcium: 9.2 mg/dL (ref 8.4–10.5)
GFR: 46.2 mL/min — ABNORMAL LOW (ref >60.00)
Potassium: 4.5 mEq/L (ref 3.5–5.1)
Sodium: 139 mEq/L (ref 135–145)

## 2011-12-13 LAB — HEMOGLOBIN A1C: Hgb A1c MFr Bld: 5.8 % (ref 4.6–6.5)

## 2011-12-13 NOTE — Assessment & Plan Note (Signed)

## 2011-12-13 NOTE — Patient Instructions (Addendum)
Please remember to followup with your GYN for the yearly pap smear and/or mammogram Please go to LAB in the Basement for the blood and/or urine tests to be done today You will be contacted by phone if any changes need to be made immediately.  Otherwise, you will receive a letter about your results with an explanation. Please continue your efforts at being more active, low cholesterol diet, and weight control. Please keep your appointments with your specialists as you have planned Please return in 6 mo with Lab testing done 3-5 days before
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
## 2011-12-13 NOTE — Assessment & Plan Note (Signed)
stable overall by hx and exam, most recent data reviewed with pt, and pt to continue medical treatment as before For a1c todya

## 2011-12-14 ENCOUNTER — Telehealth: Payer: Self-pay | Admitting: Internal Medicine

## 2011-12-17 NOTE — Telephone Encounter (Signed)
Pt requested a letter to be excused from jury duty. I wrote one letter and then pt called back with her JUROR # , etc. Mailed letters.

## 2011-12-18 ENCOUNTER — Ambulatory Visit (HOSPITAL_COMMUNITY)
Admission: RE | Admit: 2011-12-18 | Discharge: 2011-12-18 | Disposition: A | Discharge status: Home / Self Care | Payer: Medicare Other | Source: Ambulatory Visit | Attending: Internal Medicine | Admitting: Internal Medicine

## 2011-12-18 DIAGNOSIS — K838 Other specified diseases of biliary tract: Secondary | ICD-10-CM | POA: Insufficient documentation

## 2011-12-18 DIAGNOSIS — Z944 Liver transplant status: Secondary | ICD-10-CM | POA: Insufficient documentation

## 2011-12-18 DIAGNOSIS — K8020 Calculus of gallbladder without cholecystitis without obstruction: Secondary | ICD-10-CM

## 2011-12-18 DIAGNOSIS — N281 Cyst of kidney, acquired: Secondary | ICD-10-CM | POA: Insufficient documentation

## 2011-12-18 DIAGNOSIS — K439 Ventral hernia without obstruction or gangrene: Secondary | ICD-10-CM | POA: Insufficient documentation

## 2011-12-18 MED ORDER — GADOBENATE DIMEGLUMINE 529 MG/ML IV SOLN
17.0000 mL | Freq: Once | INTRAVENOUS | Status: AC | PRN
  Administered 2011-12-18: 17 mL via INTRAVENOUS

## 2011-12-22 ENCOUNTER — Other Ambulatory Visit: Payer: Self-pay | Admitting: Internal Medicine

## 2012-01-02 ENCOUNTER — Other Ambulatory Visit: Payer: Self-pay | Admitting: Internal Medicine

## 2012-01-03 ENCOUNTER — Telehealth: Payer: Self-pay | Admitting: Internal Medicine

## 2012-01-03 MED ORDER — CLONAZEPAM 1 MG PO TABS: 1.0000 mg | ORAL_TABLET | Freq: Two times a day (BID) | ORAL | Status: DC | PRN

## 2012-01-03 NOTE — Telephone Encounter (Signed)
Pharmacy states that they did not receive an rx. I will resend.

## 2012-01-24 DIAGNOSIS — Z0279 Encounter for issue of other medical certificate: Secondary | ICD-10-CM

## 2012-02-09 ENCOUNTER — Other Ambulatory Visit: Payer: Self-pay | Admitting: Internal Medicine

## 2012-02-11 ENCOUNTER — Telehealth: Payer: Self-pay | Admitting: *Deleted

## 2012-02-11 DIAGNOSIS — E876 Hypokalemia: Secondary | ICD-10-CM

## 2012-02-11 NOTE — Telephone Encounter (Signed)
Left voice mail advising patient.

## 2012-02-11 NOTE — Telephone Encounter (Signed)
Message copied by Richardson Chiquito on Mon Feb 11, 2012  1:18 PM ------      Message from: Hart Carwin      Created: Mon Feb 11, 2012 11:59 AM       20 mEq once a day, sorry.      ----- Message -----         From: Richardson Chiquito, CMA         Sent: 02/11/2012  10:02 AM           To: Hart Carwin, MD             Just to clarify-         Patient is currently on 20 meq TWICE daily of potassium. Do you want her to decrease to 1 tablet every other day or 1 tablet twice daily every other day?

## 2012-02-11 NOTE — Telephone Encounter (Signed)
Message copied by Richardson Chiquito on Mon Feb 11, 2012  9:56 AM ------      Message from: Hart Carwin      Created: Mon Feb 11, 2012  9:27 AM      Regarding: K+       Please reduce the K+ supplement to 1 qod. Repear K+ in 4 weeks      ----- Message -----         From: Richardson Chiquito, CMA         Sent: 02/11/2012   8:31 AM           To: Hart Carwin, MD            Pt requests refills on potassium. Please see labs drawn by Dr Jonny Ruiz on 12/13/11. Does dose need to be adjusted or should she continue same dose of potassium?

## 2012-02-11 NOTE — Telephone Encounter (Signed)
Patient advised that she should decrease her potassium supplement to 1 tablet every other day and we will repeat levels in 4 weeks. Patient verbalizes understanding. Orders placed in Epic for labwork to be completed 03/12/12

## 2012-03-05 ENCOUNTER — Ambulatory Visit (INDEPENDENT_AMBULATORY_CARE_PROVIDER_SITE_OTHER): Payer: Medicare Other | Admitting: Family Medicine

## 2012-03-05 VITALS — BP 138/90 | HR 101 | Temp 98.4°F | Resp 16 | Ht 64.18 in | Wt 199.2 lb

## 2012-03-05 DIAGNOSIS — Z23 Encounter for immunization: Secondary | ICD-10-CM

## 2012-03-05 DIAGNOSIS — G8929 Other chronic pain: Secondary | ICD-10-CM
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
## 2012-03-06 ENCOUNTER — Other Ambulatory Visit: Payer: Self-pay | Admitting: *Deleted

## 2012-03-06 MED ORDER — POTASSIUM CHLORIDE CRYS ER 20 MEQ PO TBCR: 20.0000 meq | EXTENDED_RELEASE_TABLET | Freq: Every day | ORAL | Status: DC

## 2012-03-20 ENCOUNTER — Other Ambulatory Visit (INDEPENDENT_AMBULATORY_CARE_PROVIDER_SITE_OTHER): Payer: Medicare Other

## 2012-03-20 ENCOUNTER — Other Ambulatory Visit: Payer: Self-pay | Admitting: Internal Medicine

## 2012-03-20 DIAGNOSIS — K7460 Unspecified cirrhosis of liver: Secondary | ICD-10-CM

## 2012-03-20 DIAGNOSIS — E876 Hypokalemia: Secondary | ICD-10-CM

## 2012-03-20 LAB — CBC WITH DIFFERENTIAL/PLATELET
Basophils Absolute: 0.1 10*3/uL (ref 0.0–0.1)
Eosinophils Absolute: 0.1 10*3/uL (ref 0.0–0.7)
HCT: 44.9 % (ref 36.0–46.0)
Hemoglobin: 14.7 g/dL (ref 12.0–15.0)
Lymphs Abs: 2.4 10*3/uL (ref 0.7–4.0)
MCHC: 32.7 g/dL (ref 30.0–36.0)
MCV: 89.9 fl (ref 78.0–100.0)
Monocytes Absolute: 0.7 10*3/uL (ref 0.1–1.0)
Neutro Abs: 5.2 10*3/uL (ref 1.4–7.7)
Platelets: 154 10*3/uL (ref 150.0–400.0)
RDW: 14.6 % (ref 11.5–14.6)

## 2012-03-20 LAB — COMPREHENSIVE METABOLIC PANEL
ALT: 31 U/L (ref 0–35)
Alkaline Phosphatase: 77 U/L (ref 39–117)
CO2: 27 mEq/L (ref 19–32)
Creatinine, Ser: 1.4 mg/dL — ABNORMAL HIGH (ref 0.4–1.2)
GFR: 41.65 mL/min — ABNORMAL LOW (ref >60.00)
Sodium: 141 mEq/L (ref 135–145)
Total Bilirubin: 0.5 mg/dL (ref 0.3–1.2)
Total Protein: 6.8 g/dL (ref 6.0–8.3)

## 2012-03-20 LAB — POTASSIUM: Potassium: 3.8 mEq/L (ref 3.5–5.1)

## 2012-03-21 ENCOUNTER — Other Ambulatory Visit: Payer: Self-pay | Admitting: *Deleted

## 2012-03-21 DIAGNOSIS — E876 Hypokalemia: Secondary | ICD-10-CM

## 2012-03-21 MED ORDER — POTASSIUM CHLORIDE CRYS ER 20 MEQ PO TBCR: EXTENDED_RELEASE_TABLET | ORAL | Status: DC

## 2012-03-28 ENCOUNTER — Encounter: Payer: Self-pay | Admitting: Family Medicine

## 2012-03-29 ENCOUNTER — Other Ambulatory Visit: Payer: Self-pay | Admitting: Internal Medicine

## 2012-03-31 ENCOUNTER — Other Ambulatory Visit: Payer: Self-pay | Admitting: Internal Medicine

## 2012-04-02 ENCOUNTER — Telehealth: Payer: Self-pay | Admitting: Internal Medicine

## 2012-04-02 NOTE — Telephone Encounter (Signed)
Labs faxed to [facility]. Patient advised.

## 2012-04-03 ENCOUNTER — Telehealth: Payer: Self-pay | Admitting: *Deleted

## 2012-04-03 NOTE — Telephone Encounter (Signed)
Spoke with patient and reminded her of lab due next week.

## 2012-04-03 NOTE — Telephone Encounter (Signed)
Message copied by [name] on Thu Apr 03, 2012 10:14 AM ------      Message from: [name]      Created: Fri Mar 21, 2012  2:03 PM       Call and remind patient due for lab recheck for DB on 04/07/12

## 2012-04-08 ENCOUNTER — Other Ambulatory Visit (INDEPENDENT_AMBULATORY_CARE_PROVIDER_SITE_OTHER): Payer: Medicare Other

## 2012-04-08 DIAGNOSIS — E876 Hypokalemia: Secondary | ICD-10-CM

## 2012-04-08 LAB — COMPREHENSIVE METABOLIC PANEL
Albumin: 3.5 g/dL (ref 3.5–5.2)
BUN: 20 mg/dL (ref 6–23)
CO2: 25 mEq/L (ref 19–32)
Calcium: 8.9 mg/dL (ref 8.4–10.5)
Chloride: 105 mEq/L (ref 96–112)
Creatinine, Ser: 1.4 mg/dL — ABNORMAL HIGH (ref 0.4–1.2)
GFR: 43.79 mL/min — ABNORMAL LOW (ref >60.00)

## 2012-04-24 ENCOUNTER — Other Ambulatory Visit: Payer: Self-pay | Admitting: *Deleted

## 2012-04-24 MED ORDER — POTASSIUM CHLORIDE CRYS ER 20 MEQ PO TBCR: EXTENDED_RELEASE_TABLET | ORAL | Status: DC

## 2012-05-08 ENCOUNTER — Other Ambulatory Visit: Payer: Self-pay | Admitting: *Deleted

## 2012-05-08 MED ORDER — CLONAZEPAM 1 MG PO TABS: 1.0000 mg | ORAL_TABLET | Freq: Two times a day (BID) | ORAL | Status: DC

## 2012-05-26 ENCOUNTER — Other Ambulatory Visit: Payer: Self-pay | Admitting: *Deleted

## 2012-05-26 MED ORDER — POTASSIUM CHLORIDE CRYS ER 20 MEQ PO TBCR: EXTENDED_RELEASE_TABLET | ORAL | Status: DC

## 2012-06-12 ENCOUNTER — Ambulatory Visit: Payer: Medicare Other | Admitting: Internal Medicine

## 2012-06-12 DIAGNOSIS — Z0289 Encounter for other administrative examinations: Secondary | ICD-10-CM

## 2012-06-24 ENCOUNTER — Telehealth: Payer: Self-pay | Admitting: *Deleted

## 2012-06-24 DIAGNOSIS — E876 Hypokalemia: Secondary | ICD-10-CM

## 2012-06-24 NOTE — Telephone Encounter (Signed)
Patient requests more refills of Potassium 20 mEq-Take 1 tablet every other day alternating with 2 tablets every other day. Her last labs were completed in 03/2012 and her last office visit with Dr [name] was 12/2011. Dr [name], does patient need repeat labs before refilling potassium?

## 2012-06-24 NOTE — Telephone Encounter (Signed)
Last K+ 3.8 in November 2013. Please obtain renal profile and refill K+.

## 2012-06-25 MED ORDER — POTASSIUM CHLORIDE CRYS ER 20 MEQ PO TBCR: EXTENDED_RELEASE_TABLET | ORAL | Status: DC

## 2012-06-25 NOTE — Telephone Encounter (Signed)
Patient advised she needs potassium level drawn. Patient states she will come in the next couple days. I will send a refill of potassium at this time as well.

## 2012-07-04 ENCOUNTER — Other Ambulatory Visit (INDEPENDENT_AMBULATORY_CARE_PROVIDER_SITE_OTHER): Payer: Medicare Other

## 2012-07-04 DIAGNOSIS — E876 Hypokalemia: Secondary | ICD-10-CM

## 2012-07-04 DIAGNOSIS — K7460 Unspecified cirrhosis of liver: Secondary | ICD-10-CM

## 2012-07-04 LAB — CBC WITH DIFFERENTIAL/PLATELET
Basophils Absolute: 0 10*3/uL (ref 0.0–0.1)
Basophils Relative: 0.1 % (ref 0.0–3.0)
Hemoglobin: 14.1 g/dL (ref 12.0–15.0)
Lymphocytes Relative: 23 % (ref 12.0–46.0)
Monocytes Relative: 6.9 % (ref 3.0–12.0)
Neutro Abs: 6.8 10*3/uL (ref 1.4–7.7)
Neutrophils Relative %: 68.4 % (ref 43.0–77.0)
RBC: 4.8 Mil/uL (ref 3.87–5.11)
RDW: 13 % (ref 11.5–14.6)

## 2012-07-04 LAB — COMPREHENSIVE METABOLIC PANEL
ALT: 20 U/L (ref 0–35)
AST: 16 U/L (ref 0–37)
Albumin: 3.9 g/dL (ref 3.5–5.2)
CO2: 22 mEq/L (ref 19–32)
Calcium: 9.1 mg/dL (ref 8.4–10.5)
Chloride: 108 mEq/L (ref 96–112)
Creatinine, Ser: 1.5 mg/dL — ABNORMAL HIGH (ref 0.4–1.2)
GFR: 39.34 mL/min — ABNORMAL LOW (ref >60.00)
Potassium: 3.6 mEq/L (ref 3.5–5.1)
Total Protein: 6.7 g/dL (ref 6.0–8.3)

## 2012-07-04 LAB — RENAL FUNCTION PANEL
Albumin: 3.9 g/dL (ref 3.5–5.2)
BUN: 19 mg/dL (ref 6–23)
Calcium: 9.1 mg/dL (ref 8.4–10.5)
Creatinine, Ser: 1.5 mg/dL — ABNORMAL HIGH (ref 0.4–1.2)
Glucose, Bld: 143 mg/dL — ABNORMAL HIGH (ref 70–99)
Phosphorus: 2.3 mg/dL (ref 2.3–4.6)
Potassium: 3.6 mEq/L (ref 3.5–5.1)

## 2012-07-05 ENCOUNTER — Encounter (HOSPITAL_COMMUNITY): Payer: Self-pay | Admitting: *Deleted

## 2012-07-05 ENCOUNTER — Emergency Department (HOSPITAL_COMMUNITY)
Admission: EM | Admit: 2012-07-05 | Discharge: 2012-07-05 | Disposition: A | Discharge status: Home / Self Care | Payer: Medicare Other | Attending: Emergency Medicine | Admitting: Emergency Medicine

## 2012-07-05 DIAGNOSIS — E785 Hyperlipidemia, unspecified: Secondary | ICD-10-CM | POA: Insufficient documentation

## 2012-07-05 DIAGNOSIS — Z860100 Personal history of colon polyps, unspecified: Secondary | ICD-10-CM | POA: Insufficient documentation

## 2012-07-05 DIAGNOSIS — Z87898 Personal history of other specified conditions: Secondary | ICD-10-CM | POA: Insufficient documentation

## 2012-07-05 DIAGNOSIS — N189 Chronic kidney disease, unspecified: Secondary | ICD-10-CM | POA: Insufficient documentation

## 2012-07-05 DIAGNOSIS — T5891XA Toxic effect of carbon monoxide from unspecified source, accidental (unintentional), initial encounter: Secondary | ICD-10-CM | POA: Insufficient documentation

## 2012-07-05 DIAGNOSIS — T5894XA Toxic effect of carbon monoxide from unspecified source, undetermined, initial encounter: Secondary | ICD-10-CM | POA: Insufficient documentation

## 2012-07-05 DIAGNOSIS — R059 Cough, unspecified: Secondary | ICD-10-CM | POA: Insufficient documentation

## 2012-07-05 DIAGNOSIS — Z8659 Personal history of other mental and behavioral disorders: Secondary | ICD-10-CM | POA: Insufficient documentation

## 2012-07-05 DIAGNOSIS — R05 Cough: Secondary | ICD-10-CM | POA: Insufficient documentation

## 2012-07-05 DIAGNOSIS — Z79899 Other long term (current) drug therapy: Secondary | ICD-10-CM | POA: Insufficient documentation

## 2012-07-05 DIAGNOSIS — K754 Autoimmune hepatitis: Secondary | ICD-10-CM | POA: Insufficient documentation

## 2012-07-05 DIAGNOSIS — Z862 Personal history of diseases of the blood and blood-forming organs and certain disorders involving the immune mechanism: Secondary | ICD-10-CM | POA: Insufficient documentation

## 2012-07-05 DIAGNOSIS — K219 Gastro-esophageal reflux disease without esophagitis: Secondary | ICD-10-CM | POA: Insufficient documentation

## 2012-07-05 DIAGNOSIS — Z7729 Contact with and (suspected ) exposure to other hazardous substances: Secondary | ICD-10-CM

## 2012-07-05 DIAGNOSIS — K7460 Unspecified cirrhosis of liver: Secondary | ICD-10-CM | POA: Insufficient documentation

## 2012-07-05 DIAGNOSIS — B349 Viral infection, unspecified: Secondary | ICD-10-CM

## 2012-07-05 DIAGNOSIS — Z8719 Personal history of other diseases of the digestive system: Secondary | ICD-10-CM | POA: Insufficient documentation

## 2012-07-05 DIAGNOSIS — R197 Diarrhea, unspecified: Secondary | ICD-10-CM | POA: Insufficient documentation

## 2012-07-05 DIAGNOSIS — Z944 Liver transplant status: Secondary | ICD-10-CM | POA: Insufficient documentation

## 2012-07-05 DIAGNOSIS — IMO0001 Reserved for inherently not codable concepts without codable children: Secondary | ICD-10-CM | POA: Insufficient documentation

## 2012-07-05 DIAGNOSIS — R51 Headache: Secondary | ICD-10-CM | POA: Insufficient documentation

## 2012-07-05 DIAGNOSIS — IMO0002 Reserved for concepts with insufficient information to code with codable children: Secondary | ICD-10-CM | POA: Insufficient documentation

## 2012-07-05 DIAGNOSIS — Y939 Activity, unspecified: Secondary | ICD-10-CM | POA: Insufficient documentation

## 2012-07-05 DIAGNOSIS — B9789 Other viral agents as the cause of diseases classified elsewhere: Secondary | ICD-10-CM | POA: Insufficient documentation

## 2012-07-05 DIAGNOSIS — Z8679 Personal history of other diseases of the circulatory system: Secondary | ICD-10-CM | POA: Insufficient documentation

## 2012-07-05 DIAGNOSIS — Y92009 Unspecified place in unspecified non-institutional (private) residence as the place of occurrence of the external cause: Secondary | ICD-10-CM | POA: Insufficient documentation

## 2012-07-05 LAB — CARBOXYHEMOGLOBIN
Carboxyhemoglobin: 0.6 % (ref 0.5–1.5)
O2 Saturation: 32.6 %
Total hemoglobin: 14.3 g/dL (ref 12.0–16.0)
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
## 2012-07-05 NOTE — ED Notes (Signed)
Pt getting ready for discharge.

## 2012-07-05 NOTE — ED Notes (Signed)
Pt reports hair loss in past week. Pt states she had midlength hair which came out gradually. Pt wearing wig on appearance.
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
## 2012-07-07 ENCOUNTER — Other Ambulatory Visit: Payer: Self-pay | Admitting: Internal Medicine

## 2012-07-14 ENCOUNTER — Telehealth: Payer: Self-pay | Admitting: Internal Medicine

## 2012-07-14 NOTE — Telephone Encounter (Signed)
Error

## 2012-07-14 NOTE — Telephone Encounter (Signed)
Left message for patient that she needs to discuss Lipitor and humidifer rx with her PCP. If she does not have a PCP, she needs to get Lipitor from whomever she is currently getting it from and will need to establish with a PCP.

## 2012-07-14 NOTE — Telephone Encounter (Signed)
Patient states she would like to speak to [facility] in regards to her Lipitor medicine and getting a prescription for a humidifier because she was seen in the ER for carbon monoxide poisoning.  I told her that she needed to contact her medical doctor but she insists I put in a message for [name] to call her.

## 2012-07-28 ENCOUNTER — Other Ambulatory Visit: Payer: Self-pay | Admitting: Internal Medicine

## 2012-08-26 ENCOUNTER — Other Ambulatory Visit: Payer: Self-pay | Admitting: Internal Medicine

## 2012-08-26 ENCOUNTER — Telehealth: Payer: Self-pay | Admitting: Radiology

## 2012-08-26 NOTE — Telephone Encounter (Signed)
Discussed with Deanna Faucette, a PA-C at [facility].  Cassandra [name] is being discharged from their practice for various discrepancies in her prescriptions, drugs not showing up in her urine, etc. She will need a referral to a new pain clinic which cannot be done by their office.  She will be advised to come and see [place] for a visit, at which time we can refer her to another pain clinic.

## 2012-08-26 NOTE — Telephone Encounter (Signed)
I took a call from [facility]. Cassandra [name], he then spoke to Dr Patsy Lager. [name]

## 2012-09-03 ENCOUNTER — Other Ambulatory Visit: Payer: Self-pay | Admitting: Internal Medicine

## 2012-10-13 ENCOUNTER — Other Ambulatory Visit: Payer: Self-pay | Admitting: Internal Medicine

## 2012-10-13 DIAGNOSIS — G8929 Other chronic pain: Secondary | ICD-10-CM | POA: Insufficient documentation

## 2012-10-13 DIAGNOSIS — Z8611 Personal history of tuberculosis: Secondary | ICD-10-CM | POA: Insufficient documentation

## 2012-11-03 ENCOUNTER — Telehealth: Payer: Self-pay | Admitting: *Deleted

## 2012-11-03 NOTE — Telephone Encounter (Signed)
OK to refill Klonopin as is.

## 2012-11-03 NOTE — Telephone Encounter (Signed)
Patient requesting refill on Klonopin Do you want to refill?

## 2012-11-04 MED ORDER — CLONAZEPAM 1 MG PO TABS: 1.0000 mg | ORAL_TABLET | Freq: Two times a day (BID) | ORAL | Status: DC | PRN

## 2012-11-04 NOTE — Telephone Encounter (Signed)
Medication faxed

## 2012-11-09 ENCOUNTER — Ambulatory Visit (INDEPENDENT_AMBULATORY_CARE_PROVIDER_SITE_OTHER): Payer: Medicare Other | Admitting: Family Medicine

## 2012-11-09 VITALS — BP 108/76 | HR 95 | Temp 98.2°F | Resp 18 | Ht 64.5 in | Wt 194.0 lb

## 2012-11-09 DIAGNOSIS — G8929 Other chronic pain: Secondary | ICD-10-CM

## 2012-11-09 DIAGNOSIS — M797 Fibromyalgia: Secondary | ICD-10-CM

## 2012-11-09 DIAGNOSIS — M25461 Effusion, right knee: Secondary | ICD-10-CM

## 2012-11-09 DIAGNOSIS — M25469 Effusion, unspecified knee: Secondary | ICD-10-CM

## 2012-11-09 DIAGNOSIS — R7302 Impaired glucose tolerance (oral): Secondary | ICD-10-CM
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
## 2012-12-01 ENCOUNTER — Other Ambulatory Visit (INDEPENDENT_AMBULATORY_CARE_PROVIDER_SITE_OTHER): Payer: Medicare Other

## 2012-12-01 DIAGNOSIS — K7460 Unspecified cirrhosis of liver: Secondary | ICD-10-CM

## 2012-12-01 LAB — COMPREHENSIVE METABOLIC PANEL
ALT: 34 U/L (ref 0–35)
AST: 16 U/L (ref 0–37)
Albumin: 3.7 g/dL (ref 3.5–5.2)
Alkaline Phosphatase: 78 U/L (ref 39–117)
Potassium: 3.2 mEq/L — ABNORMAL LOW (ref 3.5–5.1)
Sodium: 140 mEq/L (ref 135–145)
Total Protein: 6.3 g/dL (ref 6.0–8.3)

## 2012-12-01 LAB — CBC WITH DIFFERENTIAL/PLATELET
Eosinophils Absolute: 0.1 10*3/uL (ref 0.0–0.7)
Lymphocytes Relative: 33.9 % (ref 12.0–46.0)
MCHC: 33.6 g/dL (ref 30.0–36.0)
MCV: 88.5 fl (ref 78.0–100.0)
Monocytes Absolute: 0.5 10*3/uL (ref 0.1–1.0)
Neutrophils Relative %: 52.1 % (ref 43.0–77.0)
Platelets: 130 10*3/uL — ABNORMAL LOW (ref 150.0–400.0)
WBC: 5 10*3/uL (ref 4.5–10.5)

## 2012-12-02 ENCOUNTER — Other Ambulatory Visit: Payer: Self-pay | Admitting: *Deleted

## 2012-12-02 ENCOUNTER — Other Ambulatory Visit (INDEPENDENT_AMBULATORY_CARE_PROVIDER_SITE_OTHER): Payer: Medicare Other

## 2012-12-02 DIAGNOSIS — D649 Anemia, unspecified: Secondary | ICD-10-CM

## 2012-12-02 LAB — VITAMIN B12: Vitamin B-12: 528 pg/mL (ref 211–911)

## 2012-12-02 LAB — FERRITIN: Ferritin: 68.4 ng/mL (ref 10.0–291.0)

## 2012-12-02 LAB — IBC PANEL: Saturation Ratios: 29 % (ref 20.0–50.0)

## 2012-12-09 ENCOUNTER — Telehealth: Payer: Self-pay | Admitting: *Deleted

## 2012-12-09 DIAGNOSIS — E876 Hypokalemia: Secondary | ICD-10-CM

## 2012-12-09 MED ORDER — POTASSIUM CHLORIDE CRYS ER 20 MEQ PO TBCR: 20.0000 meq | EXTENDED_RELEASE_TABLET | Freq: Two times a day (BID) | ORAL | Status: DC

## 2012-12-09 NOTE — Telephone Encounter (Signed)
Message copied by Richardson Chiquito on Tue Dec 09, 2012  5:00 PM ------      Message from: Hart Carwin      Created: Tue Dec 09, 2012  1:13 PM       Last K+ 3.2 on 12/01/2012, please increase the K+ to po bid. Recheck K+ in 4 weeks      ----- Message -----         From: Richardson Chiquito, CMA         Sent: 12/09/2012  11:06 AM           To: Hart Carwin, MD            Dr [name]-      Patient requests refills on Potassium 20 meq. She is currently alternating between 1 and 2 tablets daily. However, it looks like her most recent potassium was a bit low. Would you like me to refill as is or does she need to adjust her potassium dosing?       ------

## 2012-12-09 NOTE — Telephone Encounter (Signed)
Patient has been advised that due to low potassium, she will need to increase her potassium to 20 meq 2 times daily x 4 weeks and then have her potassium rechecked at that time. Patient verbalizes understanding.

## 2012-12-15 ENCOUNTER — Telehealth: Payer: Self-pay | Admitting: Internal Medicine

## 2012-12-15 DIAGNOSIS — D649 Anemia, unspecified: Secondary | ICD-10-CM

## 2012-12-15 NOTE — Telephone Encounter (Signed)
Patient states she cannot take iron supplements due to nausea. Please, advise.

## 2012-12-15 NOTE — Telephone Encounter (Signed)
She does not have to take Iron now, her level is 29%, Recheck in 6 months

## 2012-12-15 NOTE — Telephone Encounter (Signed)
Patient notified of recommendations. Lab in EPIC . 

## 2013-01-02 ENCOUNTER — Other Ambulatory Visit: Payer: Self-pay | Admitting: *Deleted

## 2013-01-02 ENCOUNTER — Encounter: Payer: Self-pay | Admitting: Internal Medicine

## 2013-01-02 MED ORDER — CLONAZEPAM 1 MG PO TABS: 1.0000 mg | ORAL_TABLET | Freq: Two times a day (BID) | ORAL | Status: DC | PRN

## 2013-01-02 NOTE — Telephone Encounter (Signed)
Error

## 2013-01-05 ENCOUNTER — Telehealth: Payer: Self-pay | Admitting: *Deleted

## 2013-01-05 DIAGNOSIS — E538 Deficiency of other specified B group vitamins: Secondary | ICD-10-CM

## 2013-01-05 NOTE — Telephone Encounter (Signed)
Patient has been advised of Dr Regino Schultze recommendations. Lab orders have been entered in EPIC for 06/2013.

## 2013-01-05 NOTE — Telephone Encounter (Signed)
Message copied by Richardson Chiquito on Mon Jan 05, 2013  8:37 AM ------      Message from: Hart Carwin      Created: Fri Jan 02, 2013 10:51 PM       Since the injectable B12 is in short supply, she may stop the injections for 6 months and recheck in 6 months.      ----- Message -----         From: Richardson Chiquito, CMA         Sent: 01/02/2013   3:31 PM           To: Hart Carwin, MD            Dr [name], patient requests injectable B12 to be sent to her pharmacy. She had a b12 level of 528 on 12/02/12. Does she need injectable b12?      ----- Message -----         From: Roda Shutters         Sent: 01/02/2013  11:27 AM           To: Richardson Chiquito, CMA            Per pt the [facility] pharmacy has the B12 she would like that sent there.              ------

## 2013-01-06 ENCOUNTER — Other Ambulatory Visit (INDEPENDENT_AMBULATORY_CARE_PROVIDER_SITE_OTHER): Payer: Medicare Other

## 2013-01-06 DIAGNOSIS — E876 Hypokalemia: Secondary | ICD-10-CM

## 2013-01-07 LAB — POTASSIUM: Potassium: 3.9 mEq/L (ref 3.5–5.1)

## 2013-01-08 ENCOUNTER — Other Ambulatory Visit: Payer: Self-pay | Admitting: Internal Medicine

## 2013-01-08 DIAGNOSIS — E876 Hypokalemia: Secondary | ICD-10-CM

## 2013-01-09 ENCOUNTER — Other Ambulatory Visit: Payer: Self-pay | Admitting: Internal Medicine

## 2013-02-03 ENCOUNTER — Ambulatory Visit (INDEPENDENT_AMBULATORY_CARE_PROVIDER_SITE_OTHER): Payer: Medicare Other | Admitting: Internal Medicine

## 2013-02-03 ENCOUNTER — Encounter: Payer: Self-pay | Admitting: Internal Medicine

## 2013-02-03 VITALS — BP 126/84 | HR 76 | Ht 64.0 in | Wt 196.0 lb

## 2013-02-03 DIAGNOSIS — Z944 Liver transplant status: Secondary | ICD-10-CM

## 2013-02-03 DIAGNOSIS — K7689 Other specified diseases of liver: Secondary | ICD-10-CM

## 2013-02-03 DIAGNOSIS — D849 Immunodeficiency, unspecified: Secondary | ICD-10-CM

## 2013-02-03 MED ORDER — CLONAZEPAM 1 MG PO TABS: 1.0000 mg | ORAL_TABLET | Freq: Two times a day (BID) | ORAL | Status: DC | PRN

## 2013-02-03 NOTE — Progress Notes (Signed)
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 She is followed at [name] on a yearly basis. Her last appointment was in March 2014. She is on CellCept 500 mg twice a day and Prograf 2 mg in the morning, 1 mg at bedtime and prednisone 5 mg daily. Her last liver function tests in June 2014 were normal. She had an albumin of 3.7. Her renal function showed a creatinine 1.4 and her BUN wa 6 with a calculated GFR of 41. She has a positive ANA titer 1:160. She is complaining of her feet hurting. She was unable to take statins because of myalgias. She is followed at the pain clinic for low back pain and is taking Percocet. Today, she needs a refill from [place] for Klonopin 1 mg once a day. She denies fever, weight loss or rectal bleeding. She continues to take Nexium 40 mg a day for gastroesophageal reflux. Her last MRI of the liver in July 2013 to rule out a common bile duct stone showed a normal transplanted common bile duct of 4 mm with the native common bile duct of 10 mm which was a normal postoperative finding. Her last colonoscopy in October 2011 was normal. She had an adenomatous polyp in 2009.   Past Medical History  Diagnosis Date  . Hx of adenomatous colonic polyps   . External hemorrhoids   . Iron deficiency anemia   . Internal hemorrhoids   . Depression   . Anxiety   . Cirrhosis   . Autoimmune hepatitis   . GERD (gastroesophageal reflux disease)   . Fibromyalgia   . Chronic renal insufficiency   . Generalized headaches   . Hyperlipidemia   . 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 Liver replaced by transplant 02/22/2009    Qualifier: Diagnosis of  By: Cassandra Pali NP, [name]    . CIRRHOSIS 09/16/2007    Qualifier: Diagnosis of  By: [name] CMA (AAMA), [name]    . ANXIETY 09/16/2007    Qualifier: History of  By: [name] CMA (AAMA),  [name]    . ANEMIA, IRON DEFICIENCY 09/16/2007    Qualifier: Diagnosis of  By: [name] CMA (AAMA), [name]    . COLONIC POLYPS, ADENOMATOUS, HX OF 05/05/2008    Qualifier: Diagnosis of  By: Cassandra [name] CMA (AAMA), [name]     Past Surgical History  Procedure Laterality Date  . Liver transplant  1992  . Knee arthroscopy    . Rotator cuff repair    . Abdominal hysterectomy      reports that she has never smoked. She has never used smokeless tobacco. She reports that she does not drink alcohol or use illicit drugs. family history includes Diabetes in her brother; Heart disease in her sister. There is no history of Colon cancer. Allergies  Allergen Reactions  . Erythromycin     REACTION: Nausea/vomiting  . Nsaids     REACTION: GI Cramping/nausea  . Rofecoxib     REACTION: Nausea/vomiting        Review of Systems: Denies dysphagia. Positive for feet hurting with some numbness  The remainder of the 10 point ROS is negative except as outlined in H&P   Physical Exam: General appearance  Well developed, in no distress. Mildly overweight Eyes- non icteric. HEENT nontraumatic, normocephalic. Mouth no lesions, tongue papillated, no cheilosis. Neck supple without adenopathy, thyroid not enlarged, no  carotid bruits, no JVD. Lungs Clear to auscultation bilaterally. Cor normal S1, normal S2, regular rhythm, no murmur,  quiet precordium. Abdomen: Soft mildly obese. Multiple healed surgical scars. No tenderness or mass. Liver edge at costal margin. There is no ascites. Rectal: Not done. Extremities no pedal edema. Skin no lesions. No stigmata of chronic liver disease Neurological alert and oriented x 3. Psychological normal mood and affect.  Assessment and Plan:  Problem #40 52 year old African American female 22 years post orthotopic liver transplant. She is doing very well. She still has positive autoimmune markers consistent with autoimmune disease. She has multiple arthralgias and  myalgias which may be related to her underlying autoimmune process. She is doing very well from a GI standpoint. We will refill her Klonopin and schedule her for a follow up blood panel for 03/01/2013. Her last one was in June 2014. She needs to renew  Disability.papers. She will be seen at [name] in March 2015 and I will see her in one year. I suggested that she may see a neurologist to rule out peripheral neuropathy. Problem #2 Iron deficiency anemia- currently normal Iron levels  02/03/2013 [name]

## 2013-02-03 NOTE — Patient Instructions (Addendum)
We have sent the following medications to your pharmacy for you to pick up at your convenience: Klonopin  Dr Rolene Course, [facility], [place] [address]

## 2013-02-04 ENCOUNTER — Other Ambulatory Visit: Payer: Self-pay | Admitting: Internal Medicine

## 2013-02-16 ENCOUNTER — Telehealth: Payer: Self-pay | Admitting: *Deleted

## 2013-02-16 NOTE — Telephone Encounter (Signed)
Spoke with patient and she will come on 02/27/13

## 2013-02-16 NOTE — Telephone Encounter (Signed)
Message copied by [name] on Mon Feb 16, 2013  8:47 AM ------      Message from: HUNT, [place] R      Created: Thu Jan 08, 2013  9:48 AM      Regarding: Potassium lab       Pt due for repeat K level, order in epic ------

## 2013-03-01 ENCOUNTER — Ambulatory Visit: Payer: Medicare Other

## 2013-03-01 ENCOUNTER — Ambulatory Visit (INDEPENDENT_AMBULATORY_CARE_PROVIDER_SITE_OTHER): Payer: Medicare Other | Admitting: Internal Medicine

## 2013-03-01 VITALS — BP 118/76 | HR 100 | Temp 99.0°F | Resp 16 | Ht 64.0 in | Wt 198.0 lb

## 2013-03-01 DIAGNOSIS — K754 Autoimmune hepatitis: Secondary | ICD-10-CM

## 2013-03-01 DIAGNOSIS — M1711 Unilateral primary osteoarthritis, right knee: Secondary | ICD-10-CM

## 2013-03-01 DIAGNOSIS — R7309 Other abnormal glucose: Secondary | ICD-10-CM

## 2013-03-01 DIAGNOSIS — M25561 Pain in right knee: Secondary | ICD-10-CM

## 2013-03-01 DIAGNOSIS — M25569 Pain in unspecified knee: Secondary | ICD-10-CM

## 2013-03-01 DIAGNOSIS — G8929 Other chronic pain: Secondary | ICD-10-CM

## 2013-03-01 DIAGNOSIS — IMO0002 Reserved for concepts with insufficient information to code with codable children: Secondary | ICD-10-CM

## 2013-03-01 DIAGNOSIS — R7302 Impaired glucose tolerance (oral): Secondary | ICD-10-CM

## 2013-03-01 DIAGNOSIS — N189 Chronic kidney disease, unspecified: Secondary | ICD-10-CM

## 2013-03-01 DIAGNOSIS — M1710 Unilateral primary osteoarthritis, unspecified knee: Secondary | ICD-10-CM

## 2013-03-01 NOTE — Progress Notes (Signed)
Subjective:    Patient ID: Cassandra [name], female    DOB: 1961-04-15, 52 y.o.   MRN: [id number]  HPIc/o recurrence of effusion and pain in R knee-see hx lasi injec 6/14 Dr Clelia Croft worked well until last 2-3 weeks Now swelling again with painful weightbearing No redness or fever Is s/p arthros and troubles much worse since then Has been advised she needs knee replacement but reluctant to have another surgery requestinganother injection  Patient Active Problem List   Diagnosis Date Noted  . Chronic pain 03/28/2012  . Impaired glucose tolerance 12/13/2011  . Preventative health care 12/06/2011  . Fibromyalgia   . Chronic renal insufficiency   . GERD (gastroesophageal reflux disease)   . Depression   . DIARRHEA-PRESUMED INFECTIOUS 02/22/2009  . MUSCLE CRAMPS 02/22/2009  . NAUSEA AND VOMITING 02/22/2009  . NONSPECIFIC ABNORM RESULTS KIDNEY FUNCTION STUDY 02/22/2009  . FEVER, HX OF 02/22/2009  . Liver replaced by transplant 02/22/2009  . COLONIC POLYPS, ADENOMATOUS, HX OF 05/05/2008  . ANEMIA, IRON DEFICIENCY 09/16/2007  . ANXIETY 09/16/2007  . INTERNAL HEMORRHOIDS 09/16/2007  . HEMORRHOIDS, EXTERNAL 09/16/2007  . Autoimmune hepatitis 09/16/2007  . CIRRHOSIS 09/16/2007   Current outpatient prescriptions:ALPRAZolam (XANAX) 1 MG tablet, Take 1 mg by mouth at bedtime as needed. Anxiety, Disp: , Rfl: ;   clonazePAM (KLONOPIN) 1 MG tablet, Take 1 tablet (1 mg total) by mouth 2 (two) times daily as needed for anxiety., Disp: 60 tablet, Rfl: 2;   diclofenac sodium (VOLTAREN) 1 % GEL, Apply 2 g topically as needed. Inflammation, Disp: , Rfl:  hydrochlorothiazide (HYDRODIURIL) 25 MG tablet, Take 25 mg by mouth daily., Disp: , Rfl: ;   mycophenolate (CELLCEPT) 500 MG tablet, Take 500 mg by mouth 2 (two) times daily., Disp: , Rfl: ;   oxyCODONE-acetaminophen (PERCOCET) 10-325 MG per tablet, Take 1 tablet by mouth every 4 (four) hours as needed. Pain, Disp: , Rfl:  potassium chloride SA  (K-DUR,KLOR-CON) 20 MEQ tablet, TAKE 1 TABLET BY MOUTH TWICE DAILY ALTERNATING WITH 1 TABLET BY MOUTH EVERY DAY, Disp: 60 tablet, Rfl: 1;   predniSONE (DELTASONE) 5 MG tablet, Take 5 mg by mouth daily.  , Disp: , Rfl: ;   Tacrolimus (PROGRAF PO), Take 1-2 mg by mouth 2 (two) times daily. 2 mg in the morning and 1 mg at night, Disp: , Rfl: ;   atorvastatin (LIPITOR) 40 MG tablet, Take 40 mg by mouth daily. , Disp: , Rfl:  esomeprazole (NEXIUM) 40 MG capsule, Take 1 capsule (40 mg total) by mouth daily., Disp: 30 capsule, Rfl: 3;  lisinopril-hydrochlorothiazide (PRINZIDE,ZESTORETIC) 10-12.5 MG per tablet, Take 1 tablet by mouth daily., Disp: , Rfl:      Review of Systems No fever chills or night sweats No recent infections or medications No history of gout    Objective:   Physical Exam BP 118/76  Pulse 100  Temp(Src) 99 F (37.2 C) (Oral)  Resp 16  Ht 5\' 4"  (1.626 m)  Wt 198 lb (89.812 kg)  BMI 33.97 kg/m2  SpO2 99% Antalgic gait but otherwise no acute distress Right knee with a mild effusion and extreme tenderness to palpation along the joint lines and the medial border of the patella Patella is riding medially No crepitus/no redness/no heat Valgus and varus are stable McMurray's negative   Procedure After informed consent and with sterile technique, the right knee was prepped and with ethyl chloride anesthesia 20 cc of straw-colored fluid was drained through an 18-gauge needle Kenalog 40 mg +  4 cc of 1% lidocaine was instilled The patient tolerated the procedure well and describes no discomfort Ace wrap placed over sterile pad      [facility] reading (PRIMARY) by  Dr. Bolden Hagerman=bad degen changes with marked narrowing of medial compartment///see below   radiology interpretation:*RADIOLOGY REPORT*today =  Clinical Data: Knee pain, no injury  RIGHT KNEE - 1-2 VIEW  Comparison: None.  Findings: Frontal and lateral radiographs of the knee demonstrate no acute fracture or  malalignment. There are serpentine, linear and angiographic foci of sclerosis in the distal femoral Medrad diaphysis and the physis as well as in the proximal tibia consistent with multi focal bony infarcts. Additionally, there are advanced tricompartmental degenerative changes with near total loss of the medial joint space and near bone on bone contact. No focal soft tissue abnormality. Trace suprapatellar joint effusion, likely degenerative.  IMPRESSION:  1. 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 Query clinical history of sickle cell disease, chronic steroid use or history of chronic disease. 2. Advanced tricompartmental osteoarthritis worst in the medial compartment. 3. Trace suprapatellar joint effusion, likely degenerative.    Assessment & Plan:  Knee pain secondary to chronic osteoarthritis with some changes secondary to chronic steroid use and others secondary to  Surgeries  Chronic pain -multiple reasons  Autoimmune hepatitis status post liver transplant 23 years ago/stable on immunosuppression chronically  Chronic renal insufficiency  Impaired glucose tolerance   Advised to keep knee elevated and apply ice 20 minutes every 2 hours for the remainder of the day Tylenol for pain Crutches as needed She is to advance weight-bearing as tolerated over the next 2 weeks being careful to avoid overuse She should not be eligible for another steroid injection for 6 months I advised followup with an orthopedic consult at [facility] where she is cared for to consider a longer term therapy hoping to avoid further steroid injections She is also advised to return to her primary care provider w/[facility]

## 2013-03-02 ENCOUNTER — Telehealth: Payer: Self-pay | Admitting: *Deleted

## 2013-03-02 NOTE — Telephone Encounter (Signed)
Message copied by [name] on Mon Mar 02, 2013 11:08 AM ------      Message from: [name]      Created: Mon Feb 16, 2013  8:50 AM        Did patient get K level for DB                                        ------

## 2013-03-02 NOTE — Telephone Encounter (Signed)
Spoke with patient and she will come for K level this week.

## 2013-03-03 ENCOUNTER — Other Ambulatory Visit (INDEPENDENT_AMBULATORY_CARE_PROVIDER_SITE_OTHER): Payer: Medicare Other

## 2013-03-03 ENCOUNTER — Other Ambulatory Visit: Payer: Self-pay | Admitting: *Deleted

## 2013-03-03 ENCOUNTER — Telehealth: Payer: Self-pay | Admitting: Internal Medicine

## 2013-03-03 DIAGNOSIS — R944 Abnormal results of kidney function studies: Secondary | ICD-10-CM

## 2013-03-03 DIAGNOSIS — K7460 Unspecified cirrhosis of liver: Secondary | ICD-10-CM

## 2013-03-03 DIAGNOSIS — E876 Hypokalemia: Secondary | ICD-10-CM

## 2013-03-03 LAB — CBC WITH DIFFERENTIAL/PLATELET
Basophils Relative: 0 % (ref 0.0–3.0)
Eosinophils Absolute: 0 10*3/uL (ref 0.0–0.7)
Lymphocytes Relative: 16.3 % (ref 12.0–46.0)
MCHC: 33.7 g/dL (ref 30.0–36.0)
Neutrophils Relative %: 79.3 % — ABNORMAL HIGH (ref 43.0–77.0)
RBC: 4.83 Mil/uL (ref 3.87–5.11)
WBC: 9.9 10*3/uL (ref 4.5–10.5)

## 2013-03-03 LAB — COMPREHENSIVE METABOLIC PANEL
ALT: 21 U/L (ref 0–35)
AST: 12 U/L (ref 0–37)
Albumin: 4 g/dL (ref 3.5–5.2)
BUN: 32 mg/dL — ABNORMAL HIGH (ref 6–23)
Calcium: 9.4 mg/dL (ref 8.4–10.5)
Chloride: 108 mEq/L (ref 96–112)
Potassium: 3.8 mEq/L (ref 3.5–5.1)

## 2013-03-03 LAB — POTASSIUM: Potassium: 3.8 mEq/L (ref 3.5–5.1)

## 2013-03-03 NOTE — Telephone Encounter (Signed)
Patient advised rx is already at pharmacy (as per pharmacist).

## 2013-03-10 DIAGNOSIS — E782 Mixed hyperlipidemia: Secondary | ICD-10-CM | POA: Insufficient documentation

## 2013-03-10 DIAGNOSIS — E1169 Type 2 diabetes mellitus with other specified complication: Secondary | ICD-10-CM | POA: Insufficient documentation

## 2013-03-12 ENCOUNTER — Telehealth: Payer: Self-pay | Admitting: *Deleted

## 2013-03-12 NOTE — Telephone Encounter (Signed)
Spoke with patient and she will come for labs. 

## 2013-03-12 NOTE — Telephone Encounter (Signed)
Message copied by [name] on Thu Mar 12, 2013 11:01 AM ------      Message from: [name]      Created: Tue Mar 03, 2013  2:07 PM       Patient due for BMET on 03/16/13 DB. Lab in EPIC ------

## 2013-03-16 ENCOUNTER — Other Ambulatory Visit (INDEPENDENT_AMBULATORY_CARE_PROVIDER_SITE_OTHER): Payer: Medicare Other

## 2013-03-16 DIAGNOSIS — R944 Abnormal results of kidney function studies: Secondary | ICD-10-CM

## 2013-03-16 LAB — BASIC METABOLIC PANEL
BUN: 22 mg/dL (ref 6–23)
CO2: 26 mEq/L (ref 19–32)
Calcium: 9.5 mg/dL (ref 8.4–10.5)
Creatinine, Ser: 1.7 mg/dL — ABNORMAL HIGH (ref 0.4–1.2)
Glucose, Bld: 119 mg/dL — ABNORMAL HIGH (ref 70–99)

## 2013-03-17 ENCOUNTER — Other Ambulatory Visit (INDEPENDENT_AMBULATORY_CARE_PROVIDER_SITE_OTHER): Payer: Medicare Other

## 2013-03-17 ENCOUNTER — Other Ambulatory Visit: Payer: Self-pay | Admitting: *Deleted

## 2013-03-17 DIAGNOSIS — N289 Disorder of kidney and ureter, unspecified: Secondary | ICD-10-CM

## 2013-03-17 LAB — URINALYSIS
Bilirubin Urine: NEGATIVE
Hgb urine dipstick: NEGATIVE
Ketones, ur: NEGATIVE
Urine Glucose: NEGATIVE
Urobilinogen, UA: 0.2 (ref 0.0–1.0)

## 2013-03-26 ENCOUNTER — Telehealth: Payer: Self-pay | Admitting: Internal Medicine

## 2013-03-26 NOTE — Telephone Encounter (Signed)
Patient states she gave Dr. [name] her disability forms to fill out about a month ago. She states if the forms are ready can fax them to [phone]. Please, advise.

## 2013-03-26 NOTE — Telephone Encounter (Signed)
I have mentioned her disability forms on her last OV on 02/03/2013. Did she bring them with her, did we send them down? I don't have them. Can you , please, find out  What we can do to send them back?

## 2013-03-27 NOTE — Telephone Encounter (Signed)
Spoke with patient and she will have new form faxed to [place]. [name] aware and looking for forms.

## 2013-04-06 ENCOUNTER — Other Ambulatory Visit: Payer: Self-pay | Admitting: Internal Medicine

## 2013-04-13 ENCOUNTER — Telehealth: Payer: Self-pay | Admitting: Internal Medicine

## 2013-04-13 NOTE — Telephone Encounter (Signed)
[name] with [employer] left a 3:00 pm today. I will call her tomorrow morning to find out status of report. Patient advised.

## 2013-04-14 NOTE — Telephone Encounter (Signed)
Disability form filled out and awaiting Dr Regino Schultze signature.

## 2013-04-30 ENCOUNTER — Other Ambulatory Visit: Payer: Self-pay | Admitting: Internal Medicine

## 2013-04-30 DIAGNOSIS — E876 Hypokalemia: Secondary | ICD-10-CM

## 2013-05-03 ENCOUNTER — Other Ambulatory Visit: Payer: Self-pay | Admitting: Internal Medicine

## 2013-05-04 NOTE — Telephone Encounter (Signed)
Rx refilled until January. Patient will need labs done 06/08/2013. Labs already in epic.

## 2013-05-04 NOTE — Telephone Encounter (Signed)
Message copied by Richardson Chiquito on Mon May 04, 2013  2:10 PM ------      Message from: Hart Carwin      Created: Mon May 04, 2013  2:02 PM       OK to wait with rechecking the K+ till first of the year. Please refill. Please do basic met panel first week in Jan.2015      ----- Message -----         From: Richardson Chiquito, CMA         Sent: 05/04/2013  10:07 AM           To: Hart Carwin, MD            Does pt need potassium rechecked before I give her more refills?       ------

## 2013-05-27 ENCOUNTER — Other Ambulatory Visit: Payer: Self-pay | Admitting: Internal Medicine

## 2013-06-09 ENCOUNTER — Other Ambulatory Visit (INDEPENDENT_AMBULATORY_CARE_PROVIDER_SITE_OTHER): Payer: Medicare Other

## 2013-06-09 DIAGNOSIS — E876 Hypokalemia: Secondary | ICD-10-CM

## 2013-06-09 DIAGNOSIS — E538 Deficiency of other specified B group vitamins: Secondary | ICD-10-CM

## 2013-06-09 DIAGNOSIS — D649 Anemia, unspecified: Secondary | ICD-10-CM

## 2013-06-09 LAB — CBC WITH DIFFERENTIAL/PLATELET
BASOS ABS: 0 10*3/uL (ref 0.0–0.1)
Basophils Relative: 0.1 % (ref 0.0–3.0)
EOS PCT: 2.7 % (ref 0.0–5.0)
Eosinophils Absolute: 0.2 10*3/uL (ref 0.0–0.7)
HEMATOCRIT: 41.7 % (ref 36.0–46.0)
Hemoglobin: 13.9 g/dL (ref 12.0–15.0)
LYMPHS ABS: 3 10*3/uL (ref 0.7–4.0)
LYMPHS PCT: 33 % (ref 12.0–46.0)
MCHC: 33.3 g/dL (ref 30.0–36.0)
MCV: 87.2 fl (ref 78.0–100.0)
MONOS PCT: 9.2 % (ref 3.0–12.0)
Monocytes Absolute: 0.8 10*3/uL (ref 0.1–1.0)
NEUTROS PCT: 55 % (ref 43.0–77.0)
Neutro Abs: 5 10*3/uL (ref 1.4–7.7)
PLATELETS: 190 10*3/uL (ref 150.0–400.0)
RBC: 4.78 Mil/uL (ref 3.87–5.11)
RDW: 14.1 % (ref 11.5–14.6)
WBC: 9 10*3/uL (ref 4.5–10.5)

## 2013-06-09 LAB — BASIC METABOLIC PANEL
BUN: 21 mg/dL (ref 6–23)
CO2: 28 meq/L (ref 19–32)
Calcium: 9.5 mg/dL (ref 8.4–10.5)
Chloride: 106 mEq/L (ref 96–112)
Creatinine, Ser: 1.5 mg/dL — ABNORMAL HIGH (ref 0.4–1.2)
GFR: 40.14 mL/min — ABNORMAL LOW (ref >60.00)
Glucose, Bld: 106 mg/dL — ABNORMAL HIGH (ref 70–99)
Potassium: 4.1 mEq/L (ref 3.5–5.1)
SODIUM: 140 meq/L (ref 135–145)

## 2013-06-09 LAB — IBC PANEL
IRON: 84 ug/dL (ref 42–145)
SATURATION RATIOS: 22.5 % (ref 20.0–50.0)
TRANSFERRIN: 266.7 mg/dL (ref 212.0–360.0)

## 2013-06-09 LAB — VITAMIN B12: VITAMIN B 12: 607 pg/mL (ref 211–911)

## 2013-06-10 ENCOUNTER — Telehealth: Payer: Self-pay | Admitting: Internal Medicine

## 2013-06-10 DIAGNOSIS — Z944 Liver transplant status: Secondary | ICD-10-CM

## 2013-06-10 NOTE — Telephone Encounter (Signed)
Spoke with [place] in lab and she will check to see if lab can be run off the blood from yesterday.

## 2013-06-10 NOTE — Telephone Encounter (Signed)
Unable to run labs from blood drawn on 06/09/13. Patient will come on 06/11/13 for tacrolimus level.

## 2013-06-11 ENCOUNTER — Other Ambulatory Visit (INDEPENDENT_AMBULATORY_CARE_PROVIDER_SITE_OTHER): Payer: Medicare Other

## 2013-06-11 DIAGNOSIS — Z944 Liver transplant status: Secondary | ICD-10-CM

## 2013-06-11 LAB — COMPREHENSIVE METABOLIC PANEL
ALBUMIN: 3.8 g/dL (ref 3.5–5.2)
ALT: 38 U/L — ABNORMAL HIGH (ref 0–35)
AST: 16 U/L (ref 0–37)
Alkaline Phosphatase: 90 U/L (ref 39–117)
BUN: 21 mg/dL (ref 6–23)
CALCIUM: 9.1 mg/dL (ref 8.4–10.5)
CHLORIDE: 107 meq/L (ref 96–112)
CO2: 27 mEq/L (ref 19–32)
Creatinine, Ser: 1.5 mg/dL — ABNORMAL HIGH (ref 0.4–1.2)
GFR: 39.51 mL/min — ABNORMAL LOW (ref >60.00)
GLUCOSE: 111 mg/dL — AB (ref 70–99)
POTASSIUM: 3.8 meq/L (ref 3.5–5.1)
Sodium: 141 mEq/L (ref 135–145)
Total Bilirubin: 0.7 mg/dL (ref 0.3–1.2)
Total Protein: 7 g/dL (ref 6.0–8.3)

## 2013-06-12 LAB — TACROLIMUS LEVEL: TACROLIMUS LVL: 2 ng/mL — AB (ref 5.0–20.0)

## 2013-06-24 ENCOUNTER — Other Ambulatory Visit: Payer: Self-pay | Admitting: Internal Medicine

## 2013-08-04 ENCOUNTER — Other Ambulatory Visit: Payer: Self-pay | Admitting: Internal Medicine

## 2013-08-24 ENCOUNTER — Other Ambulatory Visit: Payer: Self-pay | Admitting: Internal Medicine

## 2013-08-24 DIAGNOSIS — M797 Fibromyalgia: Secondary | ICD-10-CM | POA: Insufficient documentation

## 2013-08-24 NOTE — Telephone Encounter (Signed)
Message copied by Larina Bras on Mon Aug 24, 2013  4:04 PM ------      Message from: Lafayette Dragon      Created: Mon Aug 24, 2013  3:57 PM       Just refill. Her K+ levels have been normal  At 3.8 for over 8 months,      ----- Message -----         From: Larina Bras, CMA         Sent: 08/24/2013   3:37 PM           To: Lafayette Dragon, MD            Patient requests refills on potassium. Does she need levels checked again or may I go ahead and refill?       ------

## 2013-09-16 ENCOUNTER — Other Ambulatory Visit: Payer: Self-pay | Admitting: Internal Medicine

## 2013-09-25 ENCOUNTER — Other Ambulatory Visit (INDEPENDENT_AMBULATORY_CARE_PROVIDER_SITE_OTHER): Payer: Medicare Other

## 2013-09-25 DIAGNOSIS — Z944 Liver transplant status: Secondary | ICD-10-CM

## 2013-09-25 LAB — COMPREHENSIVE METABOLIC PANEL
ALBUMIN: 3.4 g/dL — AB (ref 3.5–5.2)
ALT: 18 U/L (ref 0–35)
AST: 13 U/L (ref 0–37)
Alkaline Phosphatase: 83 U/L (ref 39–117)
BUN: 23 mg/dL (ref 6–23)
CALCIUM: 9.1 mg/dL (ref 8.4–10.5)
CHLORIDE: 107 meq/L (ref 96–112)
CO2: 25 meq/L (ref 19–32)
CREATININE: 1.4 mg/dL — AB (ref 0.4–1.2)
GFR: 42.1 mL/min — AB (ref >60.00)
Glucose, Bld: 102 mg/dL — ABNORMAL HIGH (ref 70–99)
POTASSIUM: 4.1 meq/L (ref 3.5–5.1)
SODIUM: 137 meq/L (ref 135–145)
TOTAL PROTEIN: 6.7 g/dL (ref 6.0–8.3)
Total Bilirubin: 0.4 mg/dL (ref 0.3–1.2)

## 2013-09-25 LAB — CBC WITH DIFFERENTIAL/PLATELET
Basophils Absolute: 0 10*3/uL (ref 0.0–0.1)
Basophils Relative: 0.2 % (ref 0.0–3.0)
Eosinophils Absolute: 0.1 10*3/uL (ref 0.0–0.7)
Eosinophils Relative: 0.6 % (ref 0.0–5.0)
HCT: 40.3 % (ref 36.0–46.0)
HEMOGLOBIN: 13.1 g/dL (ref 12.0–15.0)
Lymphocytes Relative: 21.8 % (ref 12.0–46.0)
Lymphs Abs: 2.2 10*3/uL (ref 0.7–4.0)
MCHC: 32.6 g/dL (ref 30.0–36.0)
MCV: 88.9 fl (ref 78.0–100.0)
MONOS PCT: 7.3 % (ref 3.0–12.0)
Monocytes Absolute: 0.7 10*3/uL (ref 0.1–1.0)
NEUTROS ABS: 7 10*3/uL (ref 1.4–7.7)
NEUTROS PCT: 70.1 % (ref 43.0–77.0)
Platelets: 199 10*3/uL (ref 150.0–400.0)
RBC: 4.54 Mil/uL (ref 3.87–5.11)
RDW: 13.5 % (ref 11.5–14.6)
WBC: 9.9 10*3/uL (ref 4.5–10.5)

## 2013-09-26 LAB — TACROLIMUS LEVEL: Tacrolimus Lvl: <2 ng/mL — ABNORMAL LOW (ref 5.0–20.0)

## 2013-10-18 ENCOUNTER — Other Ambulatory Visit: Payer: Self-pay | Admitting: Internal Medicine

## 2013-10-29 ENCOUNTER — Other Ambulatory Visit (INDEPENDENT_AMBULATORY_CARE_PROVIDER_SITE_OTHER): Payer: Medicare Other

## 2013-10-29 DIAGNOSIS — Z944 Liver transplant status: Secondary | ICD-10-CM

## 2013-10-29 LAB — COMPREHENSIVE METABOLIC PANEL
ALBUMIN: 3.4 g/dL — AB (ref 3.5–5.2)
ALK PHOS: 83 U/L (ref 39–117)
ALT: 67 U/L — AB (ref 0–35)
AST: 30 U/L (ref 0–37)
BUN: 13 mg/dL (ref 6–23)
CALCIUM: 8.9 mg/dL (ref 8.4–10.5)
CO2: 27 mEq/L (ref 19–32)
Chloride: 104 mEq/L (ref 96–112)
Creatinine, Ser: 1.4 mg/dL — ABNORMAL HIGH (ref 0.4–1.2)
GFR: 41.74 mL/min — ABNORMAL LOW (ref >60.00)
Glucose, Bld: 105 mg/dL — ABNORMAL HIGH (ref 70–99)
Potassium: 3.7 mEq/L (ref 3.5–5.1)
Sodium: 139 mEq/L (ref 135–145)
Total Bilirubin: 0.4 mg/dL (ref 0.2–1.2)
Total Protein: 6.3 g/dL (ref 6.0–8.3)

## 2013-10-29 LAB — CBC WITH DIFFERENTIAL/PLATELET
BASOS PCT: 0.4 % (ref 0.0–3.0)
Basophils Absolute: 0 10*3/uL (ref 0.0–0.1)
EOS ABS: 0.2 10*3/uL (ref 0.0–0.7)
Eosinophils Relative: 2.2 % (ref 0.0–5.0)
HCT: 37.8 % (ref 36.0–46.0)
Hemoglobin: 12.6 g/dL (ref 12.0–15.0)
LYMPHS PCT: 36.3 % (ref 12.0–46.0)
Lymphs Abs: 2.6 10*3/uL (ref 0.7–4.0)
MCHC: 33.2 g/dL (ref 30.0–36.0)
MCV: 88.5 fl (ref 78.0–100.0)
MONO ABS: 0.6 10*3/uL (ref 0.1–1.0)
Monocytes Relative: 8.9 % (ref 3.0–12.0)
Neutro Abs: 3.7 10*3/uL (ref 1.4–7.7)
Neutrophils Relative %: 52.2 % (ref 43.0–77.0)
PLATELETS: 178 10*3/uL (ref 150.0–400.0)
RBC: 4.28 Mil/uL (ref 3.87–5.11)
RDW: 13.6 % (ref 11.5–15.5)
WBC: 7.2 10*3/uL (ref 4.0–10.5)

## 2013-10-29 LAB — TACROLIMUS LEVEL: Tacrolimus Lvl: 3.1 ng/mL — ABNORMAL LOW (ref 5.0–20.0)

## 2013-11-20 ENCOUNTER — Other Ambulatory Visit (INDEPENDENT_AMBULATORY_CARE_PROVIDER_SITE_OTHER): Payer: Medicare Other

## 2013-11-20 ENCOUNTER — Ambulatory Visit (INDEPENDENT_AMBULATORY_CARE_PROVIDER_SITE_OTHER): Payer: Medicare Other | Admitting: Family Medicine

## 2013-11-20 VITALS — BP 110/78 | HR 84 | Temp 98.8°F | Resp 18 | Ht 64.0 in | Wt 202.0 lb

## 2013-11-20 DIAGNOSIS — IMO0002 Reserved for concepts with insufficient information to code with codable children: Secondary | ICD-10-CM

## 2013-11-20 DIAGNOSIS — M1711 Unilateral primary osteoarthritis, right knee: Secondary | ICD-10-CM

## 2013-11-20 DIAGNOSIS — Z944 Liver transplant status: Secondary | ICD-10-CM

## 2013-11-20 DIAGNOSIS — M1710 Unilateral primary osteoarthritis, unspecified knee: Secondary | ICD-10-CM

## 2013-11-20 LAB — COMPREHENSIVE METABOLIC PANEL
ALK PHOS: 72 U/L (ref 39–117)
ALT: 24 U/L (ref 0–35)
AST: 20 U/L (ref 0–37)
Albumin: 3.9 g/dL (ref 3.5–5.2)
BUN: 19 mg/dL (ref 6–23)
CO2: 24 mEq/L (ref 19–32)
CREATININE: 1.4 mg/dL — AB (ref 0.4–1.2)
Calcium: 9.4 mg/dL (ref 8.4–10.5)
Chloride: 108 mEq/L (ref 96–112)
GFR: 43.15 mL/min — ABNORMAL LOW (ref >60.00)
GLUCOSE: 94 mg/dL (ref 70–99)
Potassium: 3.7 mEq/L (ref 3.5–5.1)
Sodium: 140 mEq/L (ref 135–145)
Total Bilirubin: 0.7 mg/dL (ref 0.2–1.2)
Total Protein: 7 g/dL (ref 6.0–8.3)

## 2013-11-20 LAB — CBC WITH DIFFERENTIAL/PLATELET
BASOS PCT: 0.1 % (ref 0.0–3.0)
Basophils Absolute: 0 10*3/uL (ref 0.0–0.1)
EOS ABS: 0.2 10*3/uL (ref 0.0–0.7)
Eosinophils Relative: 2.6 % (ref 0.0–5.0)
HEMATOCRIT: 41.3 % (ref 36.0–46.0)
Hemoglobin: 13.8 g/dL (ref 12.0–15.0)
Lymphocytes Relative: 42.3 % (ref 12.0–46.0)
Lymphs Abs: 2.7 10*3/uL (ref 0.7–4.0)
MCHC: 33.5 g/dL (ref 30.0–36.0)
MCV: 86.9 fl (ref 78.0–100.0)
MONO ABS: 0.5 10*3/uL (ref 0.1–1.0)
Monocytes Relative: 7 % (ref 3.0–12.0)
Neutro Abs: 3.1 10*3/uL (ref 1.4–7.7)
Neutrophils Relative %: 48 % (ref 43.0–77.0)
Platelets: 181 10*3/uL (ref 150.0–400.0)
RBC: 4.75 Mil/uL (ref 3.87–5.11)
RDW: 13.8 % (ref 11.5–15.5)
WBC: 6.4 10*3/uL (ref 4.0–10.5)

## 2013-11-20 MED ORDER — METHYLPREDNISOLONE ACETATE 80 MG/ML IJ SUSP
80.0000 mg | Freq: Once | INTRAMUSCULAR | Status: AC
  Administered 2013-11-20: 80 mg via INTRA_ARTICULAR

## 2013-11-20 NOTE — Progress Notes (Signed)
Is a 53 year old woman who is 17 years status post liver transplant. She has developed recurring knee effusions on the right side of the last several years and has had the knee drained several times.  Over the last couple days and he is swollen up again and she's here for evaluation.  She's had no fever and no trauma to the knee  Objective: No acute distress Examination knee reveals full range of motion Inspection reveals swelling anteriorly Patient has a palpable effusion There is no significant point tenderness around the knee There is no erythema and ligaments are intact  After sterile prep, knee was injected with 1 cc of Marcaine and 1 cc of Depo-Medrol 80 mg per mL.  Patient tolerated the procedure well and had significant relief  Assessment: Recurrent right knee arthritis  Plan: Followup with PCP. Return for any further pain or disability  Signed Carola Frost.D.

## 2013-11-21 LAB — TACROLIMUS LEVEL: Tacrolimus Lvl: <2 ng/mL — ABNORMAL LOW (ref 5.0–20.0)

## 2013-11-25 DIAGNOSIS — F1120 Opioid dependence, uncomplicated: Secondary | ICD-10-CM | POA: Insufficient documentation

## 2013-12-06 ENCOUNTER — Other Ambulatory Visit: Payer: Self-pay | Admitting: Internal Medicine

## 2013-12-18 ENCOUNTER — Other Ambulatory Visit: Payer: Self-pay | Admitting: Internal Medicine

## 2014-01-17 ENCOUNTER — Ambulatory Visit (INDEPENDENT_AMBULATORY_CARE_PROVIDER_SITE_OTHER): Payer: Medicare Other | Admitting: Family Medicine

## 2014-01-17 ENCOUNTER — Other Ambulatory Visit: Payer: Self-pay | Admitting: Internal Medicine

## 2014-01-17 ENCOUNTER — Ambulatory Visit (INDEPENDENT_AMBULATORY_CARE_PROVIDER_SITE_OTHER): Payer: Medicare Other

## 2014-01-17 VITALS — BP 132/88 | HR 82 | Temp 97.9°F | Resp 15 | Ht 64.5 in | Wt 205.4 lb

## 2014-01-17 DIAGNOSIS — M25469 Effusion, unspecified knee: Secondary | ICD-10-CM

## 2014-01-17 DIAGNOSIS — M25561 Pain in right knee: Secondary | ICD-10-CM

## 2014-01-17 DIAGNOSIS — M25461 Effusion, right knee: Secondary | ICD-10-CM

## 2014-01-17 DIAGNOSIS — M25569 Pain in unspecified knee: Secondary | ICD-10-CM

## 2014-01-17 DIAGNOSIS — M1710 Unilateral primary osteoarthritis, unspecified knee: Secondary | ICD-10-CM

## 2014-01-17 DIAGNOSIS — M1711 Unilateral primary osteoarthritis, right knee: Secondary | ICD-10-CM

## 2014-01-17 DIAGNOSIS — K754 Autoimmune hepatitis: Secondary | ICD-10-CM

## 2014-01-17 DIAGNOSIS — Z944 Liver transplant status: Secondary | ICD-10-CM

## 2014-01-17 MED ORDER — TRAMADOL HCL 50 MG PO TABS: 50.0000 mg | ORAL_TABLET | Freq: Four times a day (QID) | ORAL | Status: DC | PRN

## 2014-01-17 NOTE — Progress Notes (Addendum)
Subjective:   This chart was scribed for Wardell Honour, MD by Forrestine Him, [facility] Scribe. This patient was seen in room Room/bed info not found11 and the patient's care was started 12:51 PM.    Patient ID: [name], female    DOB: Feb 05, 1961, 53 y.o.   MRN: [id number]  01/17/2014  Joint Swelling   HPI  HPI Comments: [name] is a 53 y.o. female with a PMHx of GERD, fibromyalgia, hyperlipidemia, s/p liver transplant who presents to [facility] complaining of constant, moderate R knee swelling and pain onset 1 month that is unchanged at this time. She describes discomfort as throbbing. She admits to hitting her knee recently which she attributes to worsening pain and swelling. Pain is exacerbated with certain movements and wind irritation. She admits to the knee popping and giving out of her. However, this is not new for her. Pt was seen 6/19 by Dr. Joseph Art and was given 1 cc of Marcaine injection and 1 cc of Depo-Medrol 80 mg injection; no fluid was removed at that visit. She is currently followed by a comprehensive pain clinic in [place], [place]. However, she states they do not draw any fluids off of the knee. Last visit with pain clinic pt states she was given the wrong medication/narcotic rx. States pain management staff encouraged her to come in for another visit for assessment and for new prescription. She declined due to additional cost and notified staff she will follow up during her next scheduled appointment next month. Pt was previously followed by Dr. Nelva Bush, orthopedist several years ago but has not been in office recently for evaluation. She has been walking with a cane at baseline for the last 2 weeks now. She is currently taking Prednisone daily for history of liver transplant. Pt with known allergies to NSAIDS, erythromycin, and Rofecoxib.   Dr. Billey Chang took fluid off knee in March 2015.  Dr. Jonni Sanger referred pt to  another pain clinic.  Previously saw [facility].    Now has established with [facility].  [facility] refused to perform surgery on patient due to high risk status of patient; all surgeries performed at [facility].    Has fallen a few times due to unstable knee R.    Pt very insistent that fluid be removed from knee today.    Review of Systems  Constitutional: Negative for fever and chills.  Musculoskeletal: Positive for arthralgias (R knee) and joint swelling (R knee).  Neurological: Negative for weakness and numbness.  Psychiatric/Behavioral: Negative for confusion.    Past Medical History  Diagnosis Date  . Hx of adenomatous colonic polyps   . External hemorrhoids   . Iron deficiency anemia   . Internal hemorrhoids   . Depression   . Anxiety   . Cirrhosis   . Autoimmune hepatitis   . GERD (gastroesophageal reflux disease)   . Fibromyalgia   . Chronic renal insufficiency   . Generalized headaches   . Hyperlipidemia   . 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 Liver replaced by transplant 02/22/2009    Qualifier: Diagnosis of  By: Chester Holstein NP, Nevin Bloodgood    . CIRRHOSIS 09/16/2007    Qualifier: Diagnosis of  By: Nelson-Tait Balistreri CMA (AAMA), [name]    . ANXIETY 09/16/2007    Qualifier: History of  By: Harlon Ditty CMA (AAMA), [name]    . ANEMIA, IRON DEFICIENCY 09/16/2007    Qualifier: Diagnosis of  By:  Nelson-Tryone Kille CMA (AAMA), [name]    . COLONIC POLYPS, ADENOMATOUS, HX OF 05/05/2008    Qualifier: Diagnosis of  By: Nelson-Jessy Cybulski CMA (AAMA), [name]     Past Surgical History  Procedure Laterality Date  . Liver transplant  1992  . Knee arthroscopy    . Rotator cuff repair    . Abdominal hysterectomy     Allergies  Allergen Reactions  . Erythromycin     REACTION: Nausea/vomiting  . Nsaids     REACTION: GI Cramping/nausea  . Rofecoxib     REACTION: Nausea/vomiting   Current Outpatient Prescriptions  Medication Sig Dispense Refill  . ALPRAZolam (XANAX) 1 MG tablet Take 1 mg  by mouth at bedtime as needed. Anxiety      . mycophenolate (CELLCEPT) 500 MG tablet Take 500 mg by mouth 2 (two) times daily.      Marland Kitchen oxyCODONE-acetaminophen (PERCOCET) 10-325 MG per tablet Take 1 tablet by mouth every 4 (four) hours as needed. Pain      . predniSONE (DELTASONE) 5 MG tablet Take 5 mg by mouth daily.        . Tacrolimus (PROGRAF PO) Take 1-2 mg by mouth 2 (two) times daily. 2 mg in the morning and 1 mg at night      . esomeprazole (NEXIUM) 40 MG capsule Take 1 capsule (40 mg total) by mouth daily.  30 capsule  3  . potassium chloride SA (K-DUR,KLOR-CON) 20 MEQ tablet TAKE 1 TABLET BY MOUTH TWICE DAILY. ALTERNATING WITH 1 TABLET EVERY DAY  60 tablet  0  . traMADol (ULTRAM) 50 MG tablet Take 1 tablet (50 mg total) by mouth every 6 (six) hours as needed.  30 tablet  0   No current facility-administered medications for this visit.       Objective:    BP 132/88  Pulse 82  Temp(Src) 97.9 F (36.6 C) (Oral)  Resp 15  Ht 5' 4.5" (1.638 m)  Wt 205 lb 6.4 oz (93.169 kg)  BMI 34.73 kg/m2  SpO2 98% Physical Exam  Nursing note and vitals reviewed. Constitutional: She is oriented to person, place, and time. She appears well-developed and well-nourished. No distress.  HENT:  Head: Normocephalic and atraumatic.  Eyes: EOM are normal.  Neck: Normal range of motion.  Cardiovascular: Normal rate, regular rhythm and normal heart sounds.   Pulmonary/Chest: Effort normal and breath sounds normal.  Abdominal: She exhibits no distension. There is no tenderness.  Musculoskeletal: Normal range of motion. She exhibits edema and tenderness.  Moderate R knee effusion Full extension and flexion of R knee McMurrays  test is negative on R knee Anterior drawer test is negative on R knee Lachmans test is negative on R knee  Neurological: She is alert and oriented to person, place, and time.  Skin: Skin is warm and dry.  Psychiatric: She has a normal mood and affect. Judgment normal.     [facility] reading (PRIMARY) by  Dr. Tamala Julian.  R KNEE:  Moderate to severe narrowing of B joint lines; patellar spurring.  No acute process.        Assessment & Plan:   1. Recurrent knee pain, right   2. Knee swelling, right   3. Primary osteoarthritis of right knee   4. Liver replaced by transplant   5. Autoimmune hepatitis    1. Recurrent R knee pain:  Recurrent; known moderate to severe OA in knee however has suffered with knee popping and giving out for the past year.  Concerning for  mensicus pathology.  Pt followed by pain management with Percocet; however, pt reports that she received the wrong rx for pain management recently.  Pt requesting rx for Tramadol until she can follow-up with pain management.  I discussed that pt should not receive any pain medications from any other providers and that if I prescribe her any medication she risks being discharged from pain management.  Pt expressed understanding but still requested small amount of Tramadol which I provided. I received a phone call from pharmacist expressing concern regarding Tramadol rx written by me.  Pharmacist reports that patient has received the same exact Percocet rx for the past several months. Pharmacist was very concerned about filling rx; I advised her of above conversation with patient; will support pharmacist's decision not to fill rx.  Pt expressed understanding apparently to pharmacist. 2.  R knee swelling mild to moderate:  Recurrent; only very small amount of fluid aspirated from knee; refused to attempt another site for aspiration due to patient's current immunosuppressed stated.  Refer to ortho. 3.  R knee OA: moderate to severe; refer to ortho. 4.  Liver transplant: on chronic immunosuppressive therapy. Expressed to patient risk of repeat knee injections and aspirations; high risk for secondary infection; would avoid repetitive injections or aspirations. 5.  Autoimmune hepatitis: s/p liver transplant.  Meds ordered  this encounter  Medications  . traMADol (ULTRAM) 50 MG tablet    Sig: Take 1 tablet (50 mg total) by mouth every 6 (six) hours as needed.    Dispense:  30 tablet    Refill:  0    No Follow-up on file.    I personally performed the services described in this documentation, which was scribed in my presence. The recorded information has been reviewed and is accurate.

## 2014-01-17 NOTE — Progress Notes (Signed)
Procedure:  Consent obtained.  Local anesthesia with 2% lido plain.  Betadine prep.  Aspiration attempted into joint from a lateral approach with only 2 cc of serous fluid aspirated.  I was never able to fully get into the joint due to her arthritic knee.  Drsg placed.

## 2014-01-17 NOTE — Patient Instructions (Signed)
1. Ice knee twice daily for one week. 2.  Take Tramadol as needed for pain; please call your pain specialist to let them know that I prescribed you tramadol today. 3.  We will contact you with an appointment with orthopedic physician with [facility] in the upcoming two weeks.

## 2014-01-19 ENCOUNTER — Telehealth: Payer: Self-pay | Admitting: Internal Medicine

## 2014-01-19 DIAGNOSIS — Z0271 Encounter for disability determination: Secondary | ICD-10-CM

## 2014-01-19 MED ORDER — POTASSIUM CHLORIDE CRYS ER 20 MEQ PO TBCR: EXTENDED_RELEASE_TABLET | ORAL | Status: DC

## 2014-01-19 NOTE — Telephone Encounter (Signed)
Rx sent 

## 2014-02-12 ENCOUNTER — Other Ambulatory Visit: Payer: Self-pay | Admitting: Internal Medicine

## 2014-02-12 ENCOUNTER — Telehealth: Payer: Self-pay | Admitting: Internal Medicine

## 2014-02-12 NOTE — Telephone Encounter (Signed)
Automatic refill request approved and patient notified to keep appt for any further refills.

## 2014-02-19 ENCOUNTER — Ambulatory Visit: Payer: Medicare Other | Admitting: Internal Medicine

## 2014-03-02 ENCOUNTER — Other Ambulatory Visit (INDEPENDENT_AMBULATORY_CARE_PROVIDER_SITE_OTHER): Payer: Medicare Other

## 2014-03-02 ENCOUNTER — Other Ambulatory Visit: Payer: Self-pay | Admitting: Internal Medicine

## 2014-03-02 DIAGNOSIS — Z944 Liver transplant status: Secondary | ICD-10-CM

## 2014-03-02 LAB — CBC WITH DIFFERENTIAL/PLATELET
Basophils Absolute: 0.1 10*3/uL (ref 0.0–0.1)
Basophils Relative: 0.9 % (ref 0.0–3.0)
EOS ABS: 0.2 10*3/uL (ref 0.0–0.7)
Eosinophils Relative: 3.3 % (ref 0.0–5.0)
HEMATOCRIT: 43 % (ref 36.0–46.0)
Hemoglobin: 14.3 g/dL (ref 12.0–15.0)
LYMPHS ABS: 2 10*3/uL (ref 0.7–4.0)
Lymphocytes Relative: 33.4 % (ref 12.0–46.0)
MCHC: 33.2 g/dL (ref 30.0–36.0)
MCV: 87.2 fl (ref 78.0–100.0)
MONO ABS: 0.5 10*3/uL (ref 0.1–1.0)
Monocytes Relative: 8.8 % (ref 3.0–12.0)
Neutro Abs: 3.3 10*3/uL (ref 1.4–7.7)
Neutrophils Relative %: 53.6 % (ref 43.0–77.0)
Platelets: 184 10*3/uL (ref 150.0–400.0)
RBC: 4.93 Mil/uL (ref 3.87–5.11)
RDW: 13.6 % (ref 11.5–15.5)
WBC: 6.1 10*3/uL (ref 4.0–10.5)

## 2014-03-02 LAB — COMPREHENSIVE METABOLIC PANEL
ALK PHOS: 96 U/L (ref 39–117)
ALT: 25 U/L (ref 0–35)
AST: 28 U/L (ref 0–37)
Albumin: 3.9 g/dL (ref 3.5–5.2)
BUN: 18 mg/dL (ref 6–23)
CO2: 26 mEq/L (ref 19–32)
Calcium: 9.6 mg/dL (ref 8.4–10.5)
Chloride: 108 mEq/L (ref 96–112)
Creatinine, Ser: 1.5 mg/dL — ABNORMAL HIGH (ref 0.4–1.2)
GFR: 37.34 mL/min — ABNORMAL LOW (ref >60.00)
Glucose, Bld: 101 mg/dL — ABNORMAL HIGH (ref 70–99)
Potassium: 4.2 mEq/L (ref 3.5–5.1)
Sodium: 139 mEq/L (ref 135–145)
Total Bilirubin: 0.7 mg/dL (ref 0.2–1.2)
Total Protein: 7.2 g/dL (ref 6.0–8.3)

## 2014-03-03 LAB — TACROLIMUS LEVEL: TACROLIMUS LVL: 2.2 ng/mL — AB (ref 5.0–20.0)

## 2014-03-08 ENCOUNTER — Other Ambulatory Visit: Payer: Self-pay | Admitting: Internal Medicine

## 2014-03-09 DIAGNOSIS — G8929 Other chronic pain: Secondary | ICD-10-CM | POA: Insufficient documentation

## 2014-03-09 DIAGNOSIS — M25561 Pain in right knee: Secondary | ICD-10-CM

## 2014-03-11 ENCOUNTER — Encounter: Payer: Self-pay | Admitting: Internal Medicine

## 2014-03-23 ENCOUNTER — Encounter: Payer: Self-pay | Admitting: Internal Medicine

## 2014-03-23 ENCOUNTER — Ambulatory Visit (INDEPENDENT_AMBULATORY_CARE_PROVIDER_SITE_OTHER): Payer: Medicare Other | Admitting: Internal Medicine

## 2014-03-23 VITALS — BP 110/68 | HR 76 | Ht 64.0 in | Wt 201.2 lb

## 2014-03-23 DIAGNOSIS — Z298 Encounter for other specified prophylactic measures: Secondary | ICD-10-CM

## 2014-03-23 DIAGNOSIS — Z7952 Long term (current) use of systemic steroids: Secondary | ICD-10-CM

## 2014-03-23 DIAGNOSIS — Z944 Liver transplant status: Secondary | ICD-10-CM

## 2014-03-23 DIAGNOSIS — Z418 Encounter for other procedures for purposes other than remedying health state: Secondary | ICD-10-CM

## 2014-03-23 NOTE — Patient Instructions (Signed)
You have been scheduled for an abdominal ultrasound at [facility] Radiology (1st floor of hospital) on 04/13/14 Tuesday at 8:30 am. Please arrive 15 minutes prior to your appointment for registration. Make certain not to have anything to eat or drink 6 hours prior to your appointment. Should you need to reschedule your appointment, please contact radiology at [phone]. This test typically takes about 30 minutes to perform.  You have been scheduled for a bone density test on 04/16/14 at 9:30 am. The test will be at [facility] Radiology (basement floor). Please go to radiology on the basement floor of [facility] [place] for this test. Please make sure not to wear any metal.

## 2014-03-23 NOTE — Progress Notes (Signed)
Cassandra [name] Jan 08, 1961 [id number]  Note: This dictation was prepared with Dragon digital system. Any transcriptional errors that result from this procedure are unintentional.   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 She has been followed by. Emilio Math NP at [facility] transplant clinic..with yearly appointments. Last visit March 2015. She has  blood tests ordered on a routine basis, results faxed to [phone]. Her last Tacrolimus  was subtherapeutic.  Prograf dose  has been reduced from 2 mg in the morning and 1 mg at night to 1 mg in the morning and 1 mg at night. The plans are to stop the Prograf in the future to reduce the risk of renal insufficiency. Last creatinine level was 1.4. She has history of iron deficiency anemia but her last hemoglobin was 14.3. She is on Cellcept 500 mg twice a day and prednisone 5 mg daily. She is due for repeat bone density, last colonoscopy October 2011 showed small ulcer at the ileocecal valve which was nonspecific and tubular adenoma. Last liver biopsy was at [facility] in 2009, last ANA titer 1:160, Last liver ultrasound in 2013 showed  dilated common bile duct. Subsequent MRCP showed no evidence of obstruction. Transplanted common bile duct was 4 mm. Her last liver function tests were normal    Past Medical History  Diagnosis Date  . Hx of adenomatous colonic polyps   . External hemorrhoids   . Iron deficiency anemia   . Internal hemorrhoids   . Depression   . Anxiety   . Cirrhosis   . Autoimmune hepatitis   . GERD (gastroesophageal reflux disease)   . Fibromyalgia   . Chronic renal insufficiency   . Generalized headaches   . Hyperlipidemia   . 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 Liver replaced by transplant 02/22/2009    Qualifier: Diagnosis of  By: Chester Holstein NP, Nevin Bloodgood    . CIRRHOSIS 09/16/2007    Qualifier: Diagnosis of  By:  [name], [name]    . ANXIETY 09/16/2007    Qualifier: History of  By: Harlon Ditty CMA (AAMA), [name]    . ANEMIA, IRON DEFICIENCY 09/16/2007    Qualifier: Diagnosis of  By: [name], [name]    . COLONIC POLYPS, ADENOMATOUS, HX OF 05/05/2008    Qualifier: Diagnosis of  By: [name], [name]      Past Surgical History  Procedure Laterality Date  . Liver transplant  1992  . Knee arthroscopy    . Rotator cuff repair    . Abdominal hysterectomy      Allergies  Allergen Reactions  . Erythromycin     REACTION: Nausea/vomiting  . Nsaids     REACTION: GI Cramping/nausea  . Rofecoxib     REACTION: Nausea/vomiting    Family history and social history have been reviewed.  Review of Systems: Occasional headaches. Shoulder pain. Knee pains and swelling and low back pain  The remainder of the 10 point ROS is negative except as outlined in the H&P  Physical Exam: General Appearance Well developed, in no distress mildly overweight mildly cushingoid Eyes  Non icteric  HEENT  Non traumatic, normocephalic  Mouth No lesion, tongue papillated, no cheilosis Neck Supple without adenopathy, thyroid not enlarged, no carotid bruits, no JVD Lungs Clear to auscultation bilaterally COR Normal S1, normal S2, regular rhythm, no murmur, quiet precordium Abdomen soft obese with normoactive bowel sounds. No tenderness. Well-healed surgical  scars. Splenic tip no palpable Rectal not done Extremities trace pedal edema Skin No lesions dark pigmentation spots in upper and lower extremities Neurological Alert and oriented x 3 Psychological Normal mood and affect  Assessment and Plan:    53 year old [place] American female almost 23 years post liver transplant, which was a success. She still has positive autoimmune markers and has multiple arthralgias but her liver function has been normal. She has a history of rejection which  responded to increase in the  immunosuppressive therapy. We are trying to reduce her Prograf  dose so she would stay only on Cellcept . She has a mild renal insufficiency which she has been closelymonitored. She is due for bone density which will be scheduled and also for upper abdominal ultrasound of the liver. I have filled out her disability forms today   [name] 03/23/2014

## 2014-04-13 ENCOUNTER — Ambulatory Visit (HOSPITAL_COMMUNITY)
Admission: RE | Admit: 2014-04-13 | Discharge: 2014-04-13 | Disposition: A | Discharge status: Home / Self Care | Payer: Medicare Other | Source: Ambulatory Visit | Attending: Internal Medicine | Admitting: Internal Medicine

## 2014-04-13 DIAGNOSIS — Z944 Liver transplant status: Secondary | ICD-10-CM | POA: Diagnosis present

## 2014-04-13 DIAGNOSIS — Z9049 Acquired absence of other specified parts of digestive tract: Secondary | ICD-10-CM | POA: Insufficient documentation

## 2014-04-13 DIAGNOSIS — Z7952 Long term (current) use of systemic steroids: Secondary | ICD-10-CM

## 2014-04-16 ENCOUNTER — Ambulatory Visit (INDEPENDENT_AMBULATORY_CARE_PROVIDER_SITE_OTHER)
Admission: RE | Admit: 2014-04-16 | Discharge: 2014-04-16 | Disposition: A | Discharge status: Home / Self Care | Payer: Medicare Other | Source: Ambulatory Visit | Attending: Internal Medicine | Admitting: Internal Medicine

## 2014-04-16 ENCOUNTER — Other Ambulatory Visit: Payer: Self-pay | Admitting: Internal Medicine

## 2014-04-16 DIAGNOSIS — Z944 Liver transplant status: Secondary | ICD-10-CM

## 2014-04-16 DIAGNOSIS — Z7952 Long term (current) use of systemic steroids: Secondary | ICD-10-CM

## 2014-05-12 ENCOUNTER — Encounter: Payer: Self-pay | Admitting: Physical Medicine & Rehabilitation

## 2014-05-16 ENCOUNTER — Other Ambulatory Visit: Payer: Self-pay | Admitting: Internal Medicine

## 2014-06-07 ENCOUNTER — Other Ambulatory Visit (INDEPENDENT_AMBULATORY_CARE_PROVIDER_SITE_OTHER): Payer: Medicare Other

## 2014-06-07 DIAGNOSIS — Z944 Liver transplant status: Secondary | ICD-10-CM

## 2014-06-07 LAB — CBC WITH DIFFERENTIAL/PLATELET
BASOS ABS: 0 10*3/uL (ref 0.0–0.1)
BASOS PCT: 0.4 % (ref 0.0–3.0)
EOS PCT: 2.9 % (ref 0.0–5.0)
Eosinophils Absolute: 0.2 10*3/uL (ref 0.0–0.7)
HEMATOCRIT: 36.9 % (ref 36.0–46.0)
Hemoglobin: 11.9 g/dL — ABNORMAL LOW (ref 12.0–15.0)
LYMPHS ABS: 1.7 10*3/uL (ref 0.7–4.0)
Lymphocytes Relative: 29.5 % (ref 12.0–46.0)
MCHC: 32.2 g/dL (ref 30.0–36.0)
MCV: 89.8 fl (ref 78.0–100.0)
Monocytes Absolute: 0.5 10*3/uL (ref 0.1–1.0)
Monocytes Relative: 8.5 % (ref 3.0–12.0)
Neutro Abs: 3.3 10*3/uL (ref 1.4–7.7)
Neutrophils Relative %: 58.7 % (ref 43.0–77.0)
Platelets: 137 10*3/uL — ABNORMAL LOW (ref 150.0–400.0)
RBC: 4.11 Mil/uL (ref 3.87–5.11)
RDW: 14.5 % (ref 11.5–15.5)
WBC: 5.6 10*3/uL (ref 4.0–10.5)

## 2014-06-07 LAB — COMPREHENSIVE METABOLIC PANEL
ALBUMIN: 3.6 g/dL (ref 3.5–5.2)
ALK PHOS: 146 U/L — AB (ref 39–117)
ALT: 144 U/L — ABNORMAL HIGH (ref 0–35)
AST: 73 U/L — ABNORMAL HIGH (ref 0–37)
BUN: 12 mg/dL (ref 6–23)
CO2: 30 meq/L (ref 19–32)
Calcium: 8.8 mg/dL (ref 8.4–10.5)
Chloride: 106 mEq/L (ref 96–112)
Creatinine, Ser: 1.2 mg/dL (ref 0.4–1.2)
GFR: 50.72 mL/min — ABNORMAL LOW (ref >60.00)
GLUCOSE: 115 mg/dL — AB (ref 70–99)
POTASSIUM: 4.2 meq/L (ref 3.5–5.1)
Sodium: 142 mEq/L (ref 135–145)
Total Bilirubin: 0.4 mg/dL (ref 0.2–1.2)
Total Protein: 6.4 g/dL (ref 6.0–8.3)

## 2014-06-08 LAB — TACROLIMUS LEVEL: Tacrolimus Lvl: 4.1 ng/mL — ABNORMAL LOW (ref 5.0–20.0)

## 2014-06-09 ENCOUNTER — Other Ambulatory Visit: Payer: Self-pay | Admitting: *Deleted

## 2014-06-09 DIAGNOSIS — R7989 Other specified abnormal findings of blood chemistry: Secondary | ICD-10-CM

## 2014-06-09 DIAGNOSIS — R945 Abnormal results of liver function studies: Secondary | ICD-10-CM

## 2014-06-11 ENCOUNTER — Ambulatory Visit: Payer: Medicare Other | Admitting: Physical Medicine & Rehabilitation

## 2014-06-15 ENCOUNTER — Other Ambulatory Visit: Payer: Self-pay | Admitting: *Deleted

## 2014-06-15 DIAGNOSIS — Z944 Liver transplant status: Secondary | ICD-10-CM

## 2014-06-23 ENCOUNTER — Other Ambulatory Visit (INDEPENDENT_AMBULATORY_CARE_PROVIDER_SITE_OTHER): Payer: Self-pay

## 2014-06-23 DIAGNOSIS — R945 Abnormal results of liver function studies: Secondary | ICD-10-CM

## 2014-06-23 DIAGNOSIS — Z944 Liver transplant status: Secondary | ICD-10-CM

## 2014-06-23 DIAGNOSIS — R7989 Other specified abnormal findings of blood chemistry: Secondary | ICD-10-CM

## 2014-06-23 LAB — COMPREHENSIVE METABOLIC PANEL
ALBUMIN: 4.1 g/dL (ref 3.5–5.2)
ALK PHOS: 93 U/L (ref 39–117)
ALT: 19 U/L (ref 0–35)
AST: 17 U/L (ref 0–37)
BUN: 18 mg/dL (ref 6–23)
CO2: 26 mEq/L (ref 19–32)
CREATININE: 1.28 mg/dL — AB (ref 0.40–1.20)
Calcium: 9.6 mg/dL (ref 8.4–10.5)
Chloride: 107 mEq/L (ref 96–112)
GFR: 46.17 mL/min — ABNORMAL LOW (ref >60.00)
GLUCOSE: 111 mg/dL — AB (ref 70–99)
Potassium: 4 mEq/L (ref 3.5–5.1)
Sodium: 139 mEq/L (ref 135–145)
Total Bilirubin: 0.5 mg/dL (ref 0.2–1.2)
Total Protein: 7 g/dL (ref 6.0–8.3)

## 2014-06-23 LAB — HEPATIC FUNCTION PANEL
ALT: 20 U/L (ref 0–35)
AST: 16 U/L (ref 0–37)
Albumin: 4.1 g/dL (ref 3.5–5.2)
Alkaline Phosphatase: 94 U/L (ref 39–117)
BILIRUBIN TOTAL: 0.5 mg/dL (ref 0.2–1.2)
Bilirubin, Direct: 0.1 mg/dL (ref 0.0–0.3)
Total Protein: 7 g/dL (ref 6.0–8.3)

## 2014-06-23 LAB — CBC WITH DIFFERENTIAL/PLATELET
BASOS PCT: 0.1 % (ref 0.0–3.0)
Basophils Absolute: 0 10*3/uL (ref 0.0–0.1)
Eosinophils Absolute: 0.1 10*3/uL (ref 0.0–0.7)
Eosinophils Relative: 1.8 % (ref 0.0–5.0)
HCT: 41.5 % (ref 36.0–46.0)
Hemoglobin: 14.1 g/dL (ref 12.0–15.0)
Lymphocytes Relative: 35.9 % (ref 12.0–46.0)
Lymphs Abs: 2.3 10*3/uL (ref 0.7–4.0)
MCHC: 33.9 g/dL (ref 30.0–36.0)
MCV: 86.4 fl (ref 78.0–100.0)
MONO ABS: 0.4 10*3/uL (ref 0.1–1.0)
Monocytes Relative: 6.8 % (ref 3.0–12.0)
NEUTROS PCT: 55.4 % (ref 43.0–77.0)
Neutro Abs: 3.6 10*3/uL (ref 1.4–7.7)
PLATELETS: 202 10*3/uL (ref 150.0–400.0)
RBC: 4.8 Mil/uL (ref 3.87–5.11)
RDW: 13.7 % (ref 11.5–15.5)
WBC: 6.5 10*3/uL (ref 4.0–10.5)

## 2014-06-23 LAB — IBC PANEL
Iron: 93 ug/dL (ref 42–145)
Saturation Ratios: 27.6 % (ref 20.0–50.0)
TRANSFERRIN: 241 mg/dL (ref 212.0–360.0)

## 2014-06-23 LAB — TACROLIMUS LEVEL: TACROLIMUS LVL: 2.6 ng/mL — AB (ref 5.0–20.0)

## 2014-06-23 LAB — PROTIME-INR
INR: 1.1 ratio — AB (ref 0.8–1.0)
Prothrombin Time: 12.1 s (ref 9.6–13.1)

## 2014-06-23 LAB — VITAMIN B12: VITAMIN B 12: 757 pg/mL (ref 211–911)

## 2014-07-07 ENCOUNTER — Other Ambulatory Visit: Payer: Self-pay | Admitting: Internal Medicine

## 2014-08-10 ENCOUNTER — Telehealth: Payer: Self-pay | Admitting: Internal Medicine

## 2014-08-11 NOTE — Telephone Encounter (Signed)
Pt is calling back about her Potassium med

## 2014-08-11 NOTE — Telephone Encounter (Signed)
Called patient to let them know that I called [facility] to check to see how they had filled their medication (Potassium). Pharmacy assured me that they did refill Rx to say, Take one tablet, po, bid alternating with one tablet every other day, #60 with two refills. Patient stated they understood.

## 2014-10-13 ENCOUNTER — Other Ambulatory Visit: Payer: Self-pay | Admitting: Internal Medicine

## 2014-10-13 NOTE — Telephone Encounter (Signed)
Sent Rx for potassium chloride (K-DUR), 20 meq, #60 with 2 refills to [facility] [address] in [place], [place].

## 2014-11-23 ENCOUNTER — Ambulatory Visit (INDEPENDENT_AMBULATORY_CARE_PROVIDER_SITE_OTHER): Payer: Medicare Other | Admitting: Internal Medicine

## 2014-11-23 ENCOUNTER — Other Ambulatory Visit (INDEPENDENT_AMBULATORY_CARE_PROVIDER_SITE_OTHER): Payer: Medicare Other

## 2014-11-23 ENCOUNTER — Encounter: Payer: Self-pay | Admitting: Internal Medicine

## 2014-11-23 VITALS — BP 116/78 | HR 72 | Ht 63.5 in | Wt 182.2 lb

## 2014-11-23 DIAGNOSIS — Z944 Liver transplant status: Secondary | ICD-10-CM

## 2014-11-23 DIAGNOSIS — Z418 Encounter for other procedures for purposes other than remedying health state: Secondary | ICD-10-CM

## 2014-11-23 DIAGNOSIS — K7689 Other specified diseases of liver: Secondary | ICD-10-CM | POA: Diagnosis not present

## 2014-11-23 DIAGNOSIS — D649 Anemia, unspecified: Secondary | ICD-10-CM

## 2014-11-23 DIAGNOSIS — Z7952 Long term (current) use of systemic steroids: Secondary | ICD-10-CM

## 2014-11-23 DIAGNOSIS — Z298 Encounter for other specified prophylactic measures: Secondary | ICD-10-CM

## 2014-11-23 LAB — COMPREHENSIVE METABOLIC PANEL
ALK PHOS: 101 U/L (ref 39–117)
ALT: 30 U/L (ref 0–35)
AST: 25 U/L (ref 0–37)
Albumin: 4.2 g/dL (ref 3.5–5.2)
BUN: 15 mg/dL (ref 6–23)
CO2: 26 mEq/L (ref 19–32)
Calcium: 9.6 mg/dL (ref 8.4–10.5)
Chloride: 105 mEq/L (ref 96–112)
Creatinine, Ser: 1.23 mg/dL — ABNORMAL HIGH (ref 0.40–1.20)
GFR: 48.27 mL/min — ABNORMAL LOW (ref >60.00)
Glucose, Bld: 98 mg/dL (ref 70–99)
Potassium: 4.2 mEq/L (ref 3.5–5.1)
SODIUM: 137 meq/L (ref 135–145)
Total Bilirubin: 0.4 mg/dL (ref 0.2–1.2)
Total Protein: 7.4 g/dL (ref 6.0–8.3)

## 2014-11-23 LAB — CBC WITH DIFFERENTIAL/PLATELET
BASOS PCT: 1.1 % (ref 0.0–3.0)
Basophils Absolute: 0.1 10*3/uL (ref 0.0–0.1)
EOS ABS: 0.2 10*3/uL (ref 0.0–0.7)
Eosinophils Relative: 2.2 % (ref 0.0–5.0)
HCT: 42.5 % (ref 36.0–46.0)
HEMOGLOBIN: 14.1 g/dL (ref 12.0–15.0)
Lymphocytes Relative: 25.1 % (ref 12.0–46.0)
Lymphs Abs: 1.8 10*3/uL (ref 0.7–4.0)
MCHC: 33.2 g/dL (ref 30.0–36.0)
MCV: 87.4 fl (ref 78.0–100.0)
Monocytes Absolute: 0.4 10*3/uL (ref 0.1–1.0)
Monocytes Relative: 5.6 % (ref 3.0–12.0)
NEUTROS ABS: 4.6 10*3/uL (ref 1.4–7.7)
Neutrophils Relative %: 66 % (ref 43.0–77.0)
Platelets: 203 10*3/uL (ref 150.0–400.0)
RBC: 4.86 Mil/uL (ref 3.87–5.11)
RDW: 14.1 % (ref 11.5–15.5)
WBC: 7 10*3/uL (ref 4.0–10.5)

## 2014-11-23 LAB — PROTIME-INR
INR: 1 ratio (ref 0.8–1.0)
Prothrombin Time: 11.3 s (ref 9.6–13.1)

## 2014-11-23 LAB — TSH: TSH: 0.99 u[IU]/mL (ref 0.35–4.50)

## 2014-11-23 NOTE — Progress Notes (Signed)
Cassandra [name] Mar 29, 1961 [id number]  Note: This dictation was prepared with Dragon digital system. Any transcriptional errors that result from this procedure are unintentional.      Expand All Collapse All   Cassandra [name] 28-Jan-1961 [id number].   History of Present Illness: This is a 54 year old African American female post orthopic liver transplant at [facility] in 1992 for autoimmune hepatitis. She has been followed by. Dr Edison Pace at [facility] transplant clinic..with yearly appointments. Last visit March 2016. She has blood tests ordered on a routine basis, results faxed to [phone]. Her last Tacrolimus was subtherapeutic. Prograf dose has been reduced from 2 mg in the morning and 1 mg at night to 1 mg in the morning and 1 mg at night. The plans are to stop the Prograf in the future to reduce the risk of renal insufficiency. Last creatinine level was 1.28. She has history of iron deficiency anemia but her last hemoglobin was 14.1. She is on Cellcept 500 mg twice a day and prednisone 5 mg daily. Last DEXA scan 03/2014 was normal. , last colonoscopy October 2011 showed small ulcer at the ileocecal valve which was nonspecific and tubular adenoma. Last liver biopsy was at [facility] in 2009, last ANA titer 1:160, Last liver ultrasound in 2013 showed dilated common bile duct. Subsequent MRCP showed no evidence of obstruction. Transplanted common bile duct was 4 mm. Her last liver function tests were normal Patient was just seen at [facility] liver clinic in March 2016 and I have reviewed her blood tests which are all normal. Her tacrolimus level was not detectable. She has no complaints today. She denies abdominal pain high-dose peripheral swelling. She is taking care of her granddaughter            Past Medical History  Diagnosis Date  . Hx of adenomatous colonic polyps   . External hemorrhoids   . Iron deficiency anemia   . Internal hemorrhoids   . Depression   . Anxiety   . Cirrhosis   .  Autoimmune hepatitis   . GERD (gastroesophageal reflux disease)   . Fibromyalgia   . Chronic renal insufficiency   . Generalized headaches   . Hyperlipidemia   . 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 Liver replaced by transplant 02/22/2009    Qualifier: Diagnosis of  By: Chester Holstein NP, Nevin Bloodgood    . CIRRHOSIS 09/16/2007    Qualifier: Diagnosis of  By: [name], [name]    . ANXIETY 09/16/2007    Qualifier: History of  By: Harlon Ditty CMA (AAMA), [name]    . ANEMIA, IRON DEFICIENCY 09/16/2007    Qualifier: Diagnosis of  By: [name], [name]    . COLONIC POLYPS, ADENOMATOUS, HX OF 05/05/2008    Qualifier: Diagnosis of  By: [name], [name]      Past Surgical History  Procedure Laterality Date  . Liver transplant  1992  . Knee arthroscopy    . Rotator cuff repair    . Abdominal hysterectomy      Allergies  Allergen Reactions  . Other Nausea Only and Other (See Comments)    Muscle Cramps Nausea Diarrhea Uncoded Allergy. Allergen: vioxx  . Statins Other (See Comments)    myalgias  . Erythromycin     REACTION: Nausea/vomiting  . Nsaids     REACTION: GI Cramping/nausea  . Rofecoxib     REACTION: Nausea/vomiting    Family history and social history have been reviewed.  Review of Systems: Denies some fatigue abdominal  pain jaundice. Her weight loss  The remainder of the 10 point ROS is negative except as outlined in the H&P  Physical Exam: General Appearance Well developed, in no distress Eyes  Non icteric  HEENT  Non traumatic, normocephalic  Mouth No lesion, tongue papillated, no cheilosis Neck Supple without adenopathy, thyroid not enlarged, no carotid bruits, no JVD Lungs Clear to auscultation bilaterally COR Normal S1, normal S2, regular rhythm, no murmur, quiet precordium Abdomen soft with well-healed surgical scars from liver transplant. Liver edge at costal margin. No ascites. No tenderness. Rectal not done  Extremities   No pedal edema Skin hyperpigmented lesions lower extremities Neurological Alert and oriented x 3 Psychological Normal mood and affect  Assessment and Plan:   54 year old African-American female 24 years post liver transplant at [facility] for autoimmune hepatitis.Her younger sister died of aggressive autoimmune hepatitis  25 years ago. She is doing remarkably well and possibly will be able to discontinue tacrolimus. Today we will draw CBC, metabolic panel, prothrombin time, TSH, iron levels and sedimentation rate. She is up-to-date on upper abdominal ultrasound which was completed in November 2015. She will be due for colonoscopy in 2021 and for repeat upper abdominal ultrasound in November 2017    [name] 11/23/2014

## 2014-11-23 NOTE — Patient Instructions (Addendum)
Your physician has requested that you go to the basement for lab work before leaving today. Dr Billey Chang

## 2014-11-24 LAB — IRON AND TIBC
%SAT: 21 % (ref 20–55)
Iron: 61 ug/dL (ref 42–145)
TIBC: 292 ug/dL (ref 250–470)
UIBC: 231 ug/dL (ref 125–400)

## 2014-11-24 LAB — SEDIMENTATION RATE: SED RATE: 21 mm/h (ref 0–22)

## 2014-11-25 LAB — TACROLIMUS LEVEL: Tacrolimus Lvl: <2 ng/mL — ABNORMAL LOW (ref 5.0–20.0)

## 2014-12-14 ENCOUNTER — Other Ambulatory Visit (INDEPENDENT_AMBULATORY_CARE_PROVIDER_SITE_OTHER): Payer: Medicare Other

## 2014-12-14 ENCOUNTER — Other Ambulatory Visit: Payer: Self-pay

## 2014-12-14 DIAGNOSIS — K769 Liver disease, unspecified: Secondary | ICD-10-CM | POA: Diagnosis not present

## 2014-12-14 DIAGNOSIS — Z944 Liver transplant status: Secondary | ICD-10-CM | POA: Diagnosis not present

## 2014-12-14 LAB — CBC WITH DIFFERENTIAL/PLATELET
BASOS ABS: 0 10*3/uL (ref 0.0–0.1)
Basophils Relative: 0.2 % (ref 0.0–3.0)
Eosinophils Absolute: 0.2 10*3/uL (ref 0.0–0.7)
Eosinophils Relative: 3.8 % (ref 0.0–5.0)
HEMATOCRIT: 43.6 % (ref 36.0–46.0)
Hemoglobin: 14.3 g/dL (ref 12.0–15.0)
LYMPHS ABS: 2.9 10*3/uL (ref 0.7–4.0)
Lymphocytes Relative: 47.3 % — ABNORMAL HIGH (ref 12.0–46.0)
MCHC: 32.9 g/dL (ref 30.0–36.0)
MCV: 88.4 fl (ref 78.0–100.0)
Monocytes Absolute: 0.5 10*3/uL (ref 0.1–1.0)
Monocytes Relative: 7.5 % (ref 3.0–12.0)
NEUTROS PCT: 41.2 % — AB (ref 43.0–77.0)
Neutro Abs: 2.6 10*3/uL (ref 1.4–7.7)
PLATELETS: 245 10*3/uL (ref 150.0–400.0)
RBC: 4.94 Mil/uL (ref 3.87–5.11)
RDW: 13.9 % (ref 11.5–15.5)
WBC: 6.2 10*3/uL (ref 4.0–10.5)

## 2014-12-14 LAB — COMPREHENSIVE METABOLIC PANEL
ALT: 24 U/L (ref 0–35)
AST: 14 U/L (ref 0–37)
Albumin: 4.1 g/dL (ref 3.5–5.2)
Alkaline Phosphatase: 121 U/L — ABNORMAL HIGH (ref 39–117)
BUN: 19 mg/dL (ref 6–23)
CHLORIDE: 105 meq/L (ref 96–112)
CO2: 26 meq/L (ref 19–32)
Calcium: 9.6 mg/dL (ref 8.4–10.5)
Creatinine, Ser: 1.38 mg/dL — ABNORMAL HIGH (ref 0.40–1.20)
GFR: 42.26 mL/min — AB (ref >60.00)
Glucose, Bld: 95 mg/dL (ref 70–99)
Potassium: 3.7 mEq/L (ref 3.5–5.1)
SODIUM: 141 meq/L (ref 135–145)
Total Bilirubin: 0.4 mg/dL (ref 0.2–1.2)
Total Protein: 7.4 g/dL (ref 6.0–8.3)

## 2014-12-14 NOTE — Progress Notes (Signed)
Lab called and needed lab order changed to [employer].

## 2014-12-15 ENCOUNTER — Other Ambulatory Visit: Payer: Self-pay | Admitting: *Deleted

## 2014-12-15 DIAGNOSIS — R7989 Other specified abnormal findings of blood chemistry: Secondary | ICD-10-CM

## 2014-12-15 LAB — TACROLIMUS LEVEL: Tacrolimus Lvl: 2.3 ng/mL — ABNORMAL LOW (ref 5.0–20.0)

## 2014-12-17 ENCOUNTER — Telehealth: Payer: Self-pay | Admitting: Internal Medicine

## 2014-12-17 NOTE — Telephone Encounter (Signed)
Per patient request mailed results to her and faxed results to [facility].

## 2014-12-30 ENCOUNTER — Telehealth: Payer: Self-pay | Admitting: Internal Medicine

## 2014-12-31 NOTE — Telephone Encounter (Signed)
Please refill Prograf, [facility] has to decide whether to discontinue

## 2014-12-31 NOTE — Telephone Encounter (Signed)
Dr Olevia Perches patient wants a refill on Prograf. I see in your last office note where you mentioned the medication and about patient eventually coming off the medication but the Rx has never been filled here. Please advise.

## 2015-01-03 MED ORDER — TACROLIMUS 1 MG PO CAPS: 1.0000 mg | ORAL_CAPSULE | Freq: Two times a day (BID) | ORAL | Status: DC

## 2015-01-03 NOTE — Telephone Encounter (Signed)
Called the pharmacy and cancelled the appointment because patient stated she had gotten the medication refilled by [facility].

## 2015-01-06 ENCOUNTER — Telehealth: Payer: Self-pay | Admitting: Internal Medicine

## 2015-01-06 ENCOUNTER — Telehealth: Payer: Self-pay | Admitting: *Deleted

## 2015-01-06 NOTE — Telephone Encounter (Signed)
Spoke with patient and she talked with [facility] and they told her that repeating this was not necessary. Please, advise if this is ok.

## 2015-01-06 NOTE — Telephone Encounter (Signed)
Patient is coming in AM for labs.

## 2015-01-06 NOTE — Telephone Encounter (Signed)
Patient will come next week for Bmet.

## 2015-01-06 NOTE — Telephone Encounter (Signed)
-----   Message from Hulan Saas, RN sent at 12/15/2014  2:40 PM EDT ----- Call and remind patient due for Bmet on 01/10/15 for DB. Lab in EPIC.

## 2015-01-12 ENCOUNTER — Other Ambulatory Visit (INDEPENDENT_AMBULATORY_CARE_PROVIDER_SITE_OTHER): Payer: Medicare Other

## 2015-01-12 DIAGNOSIS — R748 Abnormal levels of other serum enzymes: Secondary | ICD-10-CM | POA: Diagnosis not present

## 2015-01-12 DIAGNOSIS — R7989 Other specified abnormal findings of blood chemistry: Secondary | ICD-10-CM

## 2015-01-12 LAB — BASIC METABOLIC PANEL
BUN: 17 mg/dL (ref 6–23)
CHLORIDE: 103 meq/L (ref 96–112)
CO2: 24 meq/L (ref 19–32)
Calcium: 9.8 mg/dL (ref 8.4–10.5)
Creatinine, Ser: 1.25 mg/dL — ABNORMAL HIGH (ref 0.40–1.20)
GFR: 47.35 mL/min — ABNORMAL LOW (ref >60.00)
Glucose, Bld: 114 mg/dL — ABNORMAL HIGH (ref 70–99)
Potassium: 3.9 mEq/L (ref 3.5–5.1)
Sodium: 137 mEq/L (ref 135–145)

## 2015-01-20 ENCOUNTER — Other Ambulatory Visit: Payer: Self-pay | Admitting: Internal Medicine

## 2015-02-17 ENCOUNTER — Other Ambulatory Visit (INDEPENDENT_AMBULATORY_CARE_PROVIDER_SITE_OTHER): Payer: Medicare Other

## 2015-02-17 DIAGNOSIS — Z944 Liver transplant status: Secondary | ICD-10-CM

## 2015-02-17 LAB — CBC WITH DIFFERENTIAL/PLATELET
BASOS PCT: 0.5 % (ref 0.0–3.0)
Basophils Absolute: 0 10*3/uL (ref 0.0–0.1)
EOS ABS: 0.3 10*3/uL (ref 0.0–0.7)
EOS PCT: 3.9 % (ref 0.0–5.0)
HCT: 40.8 % (ref 36.0–46.0)
Hemoglobin: 13.5 g/dL (ref 12.0–15.0)
LYMPHS PCT: 40.7 % (ref 12.0–46.0)
Lymphs Abs: 2.7 10*3/uL (ref 0.7–4.0)
MCHC: 33.1 g/dL (ref 30.0–36.0)
MCV: 87.9 fl (ref 78.0–100.0)
Monocytes Absolute: 0.6 10*3/uL (ref 0.1–1.0)
Monocytes Relative: 9.3 % (ref 3.0–12.0)
Neutro Abs: 3 10*3/uL (ref 1.4–7.7)
Neutrophils Relative %: 45.6 % (ref 43.0–77.0)
Platelets: 201 10*3/uL (ref 150.0–400.0)
RBC: 4.64 Mil/uL (ref 3.87–5.11)
RDW: 14.2 % (ref 11.5–15.5)
WBC: 6.6 10*3/uL (ref 4.0–10.5)

## 2015-02-17 LAB — COMPREHENSIVE METABOLIC PANEL
ALT: 32 U/L (ref 0–35)
AST: 21 U/L (ref 0–37)
Albumin: 3.9 g/dL (ref 3.5–5.2)
Alkaline Phosphatase: 115 U/L (ref 39–117)
BUN: 24 mg/dL — ABNORMAL HIGH (ref 6–23)
CO2: 26 mEq/L (ref 19–32)
Calcium: 9.2 mg/dL (ref 8.4–10.5)
Chloride: 107 mEq/L (ref 96–112)
Creatinine, Ser: 1.29 mg/dL — ABNORMAL HIGH (ref 0.40–1.20)
GFR: 45.65 mL/min — ABNORMAL LOW (ref >60.00)
Glucose, Bld: 94 mg/dL (ref 70–99)
Potassium: 3.9 mEq/L (ref 3.5–5.1)
Sodium: 141 mEq/L (ref 135–145)
Total Bilirubin: 0.6 mg/dL (ref 0.2–1.2)
Total Protein: 7 g/dL (ref 6.0–8.3)

## 2015-02-18 LAB — TACROLIMUS LEVEL: Tacrolimus Lvl: 3.2 ng/mL — ABNORMAL LOW (ref 5.0–20.0)

## 2015-03-16 ENCOUNTER — Other Ambulatory Visit: Payer: Self-pay

## 2015-03-16 NOTE — Telephone Encounter (Signed)
Patient is scheduled for an appointment with on 04/15/2015. I received a rx refill for potassium 20MEQ. Would you like me to refill this or wait until the patient is seen.

## 2015-03-18 NOTE — Telephone Encounter (Signed)
Called and left vm for pt to call back.  

## 2015-03-18 NOTE — Telephone Encounter (Signed)
Her Potassium has been normal, I couldn't find a reason for continuing Potassium but please verify with patient and transplant center if she needs to continue it. Thanks

## 2015-03-21 ENCOUNTER — Telehealth: Payer: Self-pay | Admitting: Gastroenterology

## 2015-03-21 DIAGNOSIS — Z944 Liver transplant status: Secondary | ICD-10-CM

## 2015-03-21 NOTE — Telephone Encounter (Signed)
Ok to refill potassium? Pt has a follow up apt scheduled on 04-15-15. Her last potassium level was 3.9 on 02-17-15. Thank you

## 2015-03-21 NOTE — Telephone Encounter (Signed)
Please have her check repeat BMP and ok to give 1 refill if normal Potassium level. Thanks

## 2015-03-21 NOTE — Telephone Encounter (Signed)
Patient notified and Lab placed in EPIC

## 2015-03-25 ENCOUNTER — Other Ambulatory Visit (INDEPENDENT_AMBULATORY_CARE_PROVIDER_SITE_OTHER): Payer: Medicare Other

## 2015-03-25 DIAGNOSIS — Z944 Liver transplant status: Secondary | ICD-10-CM

## 2015-03-25 LAB — BASIC METABOLIC PANEL
BUN: 17 mg/dL (ref 6–23)
CALCIUM: 9.7 mg/dL (ref 8.4–10.5)
CHLORIDE: 108 meq/L (ref 96–112)
CO2: 26 meq/L (ref 19–32)
Creatinine, Ser: 1.33 mg/dL — ABNORMAL HIGH (ref 0.40–1.20)
GFR: 44.05 mL/min — ABNORMAL LOW (ref >60.00)
Glucose, Bld: 96 mg/dL (ref 70–99)
Potassium: 4.1 mEq/L (ref 3.5–5.1)
Sodium: 141 mEq/L (ref 135–145)

## 2015-04-04 ENCOUNTER — Telehealth: Payer: Self-pay

## 2015-04-04 ENCOUNTER — Other Ambulatory Visit: Payer: Self-pay

## 2015-04-04 ENCOUNTER — Telehealth: Payer: Self-pay | Admitting: Gastroenterology

## 2015-04-04 DIAGNOSIS — Z944 Liver transplant status: Secondary | ICD-10-CM

## 2015-04-04 DIAGNOSIS — Z298 Encounter for other specified prophylactic measures: Secondary | ICD-10-CM

## 2015-04-04 NOTE — Telephone Encounter (Signed)
Spoke with the patient. She has been contacted by her transplant team coordinator. She has been instructed by them to come in for the "missed labs". Standing orders received via fax. Given to Dr Silverio Decamp for review and signature.

## 2015-04-04 NOTE — Telephone Encounter (Signed)
Call received from the [facility] transplant clinic. The patient previously had standing order to have CBC, CMET and Tacrolimus level drawn and faxed to them on a regular basis under Dr Olevia Perches. New standing orders will be faxed for her new provider to approve.

## 2015-04-04 NOTE — Telephone Encounter (Signed)
Okay to order per Dr Silverio Decamp. Patient notified via voicemail.

## 2015-04-06 ENCOUNTER — Other Ambulatory Visit: Payer: Self-pay | Admitting: Gastroenterology

## 2015-04-06 ENCOUNTER — Other Ambulatory Visit (INDEPENDENT_AMBULATORY_CARE_PROVIDER_SITE_OTHER): Payer: Medicare Other

## 2015-04-06 DIAGNOSIS — Z298 Encounter for other specified prophylactic measures: Secondary | ICD-10-CM

## 2015-04-06 DIAGNOSIS — Z418 Encounter for other procedures for purposes other than remedying health state: Secondary | ICD-10-CM

## 2015-04-06 DIAGNOSIS — Z944 Liver transplant status: Secondary | ICD-10-CM

## 2015-04-06 LAB — COMPREHENSIVE METABOLIC PANEL
ALT: 110 U/L — ABNORMAL HIGH (ref 0–35)
AST: 45 U/L — ABNORMAL HIGH (ref 0–37)
Albumin: 3.8 g/dL (ref 3.5–5.2)
Alkaline Phosphatase: 185 U/L — ABNORMAL HIGH (ref 39–117)
BUN: 17 mg/dL (ref 6–23)
CHLORIDE: 103 meq/L (ref 96–112)
CO2: 32 meq/L (ref 19–32)
Calcium: 9.7 mg/dL (ref 8.4–10.5)
Creatinine, Ser: 1.13 mg/dL (ref 0.40–1.20)
GFR: 53.16 mL/min — AB (ref >60.00)
GLUCOSE: 122 mg/dL — AB (ref 70–99)
POTASSIUM: 3.8 meq/L (ref 3.5–5.1)
SODIUM: 140 meq/L (ref 135–145)
TOTAL PROTEIN: 6.9 g/dL (ref 6.0–8.3)
Total Bilirubin: 0.5 mg/dL (ref 0.2–1.2)

## 2015-04-06 LAB — CBC WITH DIFFERENTIAL/PLATELET
BASOS PCT: 0.6 % (ref 0.0–3.0)
Basophils Absolute: 0 10*3/uL (ref 0.0–0.1)
EOS PCT: 2.5 % (ref 0.0–5.0)
Eosinophils Absolute: 0.2 10*3/uL (ref 0.0–0.7)
HCT: 40.4 % (ref 36.0–46.0)
Hemoglobin: 13.4 g/dL (ref 12.0–15.0)
LYMPHS ABS: 2.2 10*3/uL (ref 0.7–4.0)
Lymphocytes Relative: 35.2 % (ref 12.0–46.0)
MCHC: 33.3 g/dL (ref 30.0–36.0)
MCV: 87.6 fl (ref 78.0–100.0)
MONO ABS: 0.4 10*3/uL (ref 0.1–1.0)
MONOS PCT: 6.4 % (ref 3.0–12.0)
NEUTROS PCT: 55.3 % (ref 43.0–77.0)
Neutro Abs: 3.4 10*3/uL (ref 1.4–7.7)
Platelets: 170 10*3/uL (ref 150.0–400.0)
RBC: 4.61 Mil/uL (ref 3.87–5.11)
RDW: 14.5 % (ref 11.5–15.5)
WBC: 6.2 10*3/uL (ref 4.0–10.5)

## 2015-04-07 LAB — TACROLIMUS LEVEL: Tacrolimus Lvl: <2 ng/mL — ABNORMAL LOW (ref 5.0–20.0)

## 2015-04-11 ENCOUNTER — Other Ambulatory Visit: Payer: Self-pay | Admitting: Gastroenterology

## 2015-04-11 ENCOUNTER — Other Ambulatory Visit (INDEPENDENT_AMBULATORY_CARE_PROVIDER_SITE_OTHER): Payer: Medicare Other

## 2015-04-11 DIAGNOSIS — Z418 Encounter for other procedures for purposes other than remedying health state: Secondary | ICD-10-CM | POA: Diagnosis not present

## 2015-04-11 DIAGNOSIS — Z944 Liver transplant status: Secondary | ICD-10-CM | POA: Diagnosis not present

## 2015-04-11 DIAGNOSIS — Z298 Encounter for other specified prophylactic measures: Secondary | ICD-10-CM

## 2015-04-11 LAB — COMPREHENSIVE METABOLIC PANEL
ALBUMIN: 3.8 g/dL (ref 3.5–5.2)
ALK PHOS: 116 U/L (ref 39–117)
ALT: 31 U/L (ref 0–35)
AST: 15 U/L (ref 0–37)
BUN: 18 mg/dL (ref 6–23)
CALCIUM: 9.5 mg/dL (ref 8.4–10.5)
CO2: 25 mEq/L (ref 19–32)
Chloride: 106 mEq/L (ref 96–112)
Creatinine, Ser: 1.31 mg/dL — ABNORMAL HIGH (ref 0.40–1.20)
GFR: 44.82 mL/min — AB (ref >60.00)
Glucose, Bld: 99 mg/dL (ref 70–99)
POTASSIUM: 3.7 meq/L (ref 3.5–5.1)
SODIUM: 140 meq/L (ref 135–145)
TOTAL PROTEIN: 6.7 g/dL (ref 6.0–8.3)
Total Bilirubin: 0.5 mg/dL (ref 0.2–1.2)

## 2015-04-11 LAB — CBC WITH DIFFERENTIAL/PLATELET
Basophils Absolute: 0 10*3/uL (ref 0.0–0.1)
Basophils Relative: 0.3 % (ref 0.0–3.0)
EOS PCT: 3 % (ref 0.0–5.0)
Eosinophils Absolute: 0.2 10*3/uL (ref 0.0–0.7)
HCT: 42.2 % (ref 36.0–46.0)
HEMOGLOBIN: 13.9 g/dL (ref 12.0–15.0)
Lymphocytes Relative: 40 % (ref 12.0–46.0)
Lymphs Abs: 2.6 10*3/uL (ref 0.7–4.0)
MCHC: 33 g/dL (ref 30.0–36.0)
MCV: 88.7 fl (ref 78.0–100.0)
MONO ABS: 0.6 10*3/uL (ref 0.1–1.0)
Monocytes Relative: 10 % (ref 3.0–12.0)
Neutro Abs: 3 10*3/uL (ref 1.4–7.7)
Neutrophils Relative %: 46.7 % (ref 43.0–77.0)
Platelets: 196 10*3/uL (ref 150.0–400.0)
RBC: 4.76 Mil/uL (ref 3.87–5.11)
RDW: 14.8 % (ref 11.5–15.5)
WBC: 6.4 10*3/uL (ref 4.0–10.5)

## 2015-04-12 LAB — TACROLIMUS LEVEL: TACROLIMUS LVL: 3 ng/mL — AB (ref 5.0–20.0)

## 2015-04-15 ENCOUNTER — Encounter: Payer: Self-pay | Admitting: Gastroenterology

## 2015-04-15 ENCOUNTER — Ambulatory Visit (INDEPENDENT_AMBULATORY_CARE_PROVIDER_SITE_OTHER): Payer: Medicare Other | Admitting: Gastroenterology

## 2015-04-15 VITALS — BP 114/78 | HR 86 | Ht 64.0 in | Wt 188.0 lb

## 2015-04-15 DIAGNOSIS — Z944 Liver transplant status: Secondary | ICD-10-CM | POA: Diagnosis not present

## 2015-04-15 DIAGNOSIS — K754 Autoimmune hepatitis: Secondary | ICD-10-CM

## 2015-04-15 NOTE — Progress Notes (Signed)
Cassandra [name]    [id number]    1960/11/24  Primary Care Physician:ANDY,CAMILLE L, MD  Referring Physician: Leamon Arnt, MD [address] [place], [place] [address]  Chief complaint:  Autoimmune hepatitis s/p liver transplant  HPI: This is a 54 year old African American female post orthopic liver transplant at [facility] in 1992 for autoimmune hepatitis. She has been followed by. Dr Edison Pace at [facility] transplant clinic with yearly appointments. Last visit March 2016. She has blood tests ordered on a routine basis, results faxed to [phone].    Her last liver function tests were normal on Nov 2 showed elevated AST 45, ALT 110 and Alk phos 185 along with Tacrolimus was undetectable and the dose was increased to 64m twice daily, AST, ALT and Alk phos trended back to baseline value on Nov 7  She is on Cellcept 500 mg twice a day and prednisone 5 mg daily. Last DEXA scan 03/2014 was normal. , last colonoscopy October 2011 was normal. [facility] last ANA titer 1:160, Last liver ultrasound in 2013 showed dilated common bile duct. Subsequent MRCP showed no evidence of obstruction. Transplanted common bile duct was 4 mm. [facility] She has no GI complaints today. She has swelling of her R eye lid and reports that its getting better with warm compress.   Outpatient Encounter Prescriptions as of 04/15/2015  Medication Sig  . ALPRAZolam (XANAX) 1 MG tablet Take 1 mg by mouth at bedtime as needed. Anxiety  . mycophenolate (CELLCEPT) 500 MG tablet Take 500 mg by mouth 2 (two) times daily.  .Marland Kitchenomega-3 acid ethyl esters (LOVAZA) 1 G capsule Take 1 g by mouth 2 (two) times daily.   .Marland KitchenoxyCODONE-acetaminophen (PERCOCET) 10-325 MG per tablet Take 1 tablet by mouth 3 (three) times daily as needed. Pain  . potassium chloride SA (K-DUR,KLOR-CON) 20 MEQ tablet TAKE 1 TABLET BY MOUTH TWICE DAILY, ALTERNATING WITH  1 TABLET EVERY OTHER DAY  . PredniSONE (DELTASONE PO) Take 1 tablet by mouth. Prednisone just reduced from 545mto 4 mg  . tacrolimus (PROGRAF) 1 MG capsule Take 1 capsule (1 mg total) by mouth 2 (two) times daily. (Patient taking differently: Take 2 mg by mouth 2 (two) times daily. )  . Triamcinolone Acetonide (TRIAMCINOLONE 0.1 % CREAM : EUCERIN) CREA Apply 1 application topically 2 (two) times daily.   . Marland Kitchensomeprazole (NEXIUM) 40 MG capsule Take 1 capsule (40 mg total) by mouth daily.  . [DISCONTINUED] predniSONE (DELTASONE) 5 MG tablet Take 5 mg by mouth daily.     No facility-administered encounter medications on file as of 04/15/2015.    Allergies as of 04/15/2015 - Review Complete 04/15/2015  Allergen Reaction Noted  . Other Nausea Only and Other (See Comments) 11/23/2014  . Statins Other (See Comments) 11/23/2014  . Erythromycin  03/10/2008  . Nsaids  03/10/2008  . Rofecoxib  03/10/2008    Past Medical History  Diagnosis Date  . Hx of adenomatous colonic polyps   . External hemorrhoids   . Iron deficiency anemia   . Internal hemorrhoids   . Depression   . Anxiety   . Cirrhosis ([place]  . Autoimmune hepatitis ([place]  . GERD (gastroesophageal reflux disease)   . Fibromyalgia   . Chronic renal insufficiency   . Generalized headaches   . Hyperlipidemia   . Positive skin  test for tuberculosis   . Liver replaced by transplant ([place]) 02/22/2009    Qualifier: Diagnosis of  By: Chester Holstein NP, Nevin Bloodgood    . CIRRHOSIS 09/16/2007    Qualifier: Diagnosis of  By: [name], [name]    . ANXIETY 09/16/2007    Qualifier: History of  By: Harlon Ditty CMA (AAMA), [name]    . ANEMIA, IRON DEFICIENCY 09/16/2007    Qualifier: Diagnosis of  By: [name], [name]    . COLONIC POLYPS, ADENOMATOUS, HX OF 05/05/2008    Qualifier: Diagnosis of  By: [name], [name]      Past Surgical History  Procedure Laterality Date  . Liver transplant  1992  . Knee  arthroscopy    . Rotator cuff repair    . Abdominal hysterectomy      Family History  Problem Relation Age of Onset  . Diabetes Brother   . Heart disease Sister   . Colon cancer Neg Hx     Social History   Social History  . Marital Status: Single    Spouse Name: N/A  . Number of Children: N/A  . Years of Education: N/A   Occupational History  . Not on file.   Social History Main Topics  . Smoking status: Never Smoker   . Smokeless tobacco: Never Used  . Alcohol Use: No  . Drug Use: No  . Sexual Activity: Not on file   Other Topics Concern  . Not on file   Social History Narrative      Review of systems: Review of Systems  Constitutional: Negative for fever and chills.  HENT: Negative.   Eyes: Negative for blurred vision.  Respiratory: Negative for cough, shortness of breath and wheezing.   Cardiovascular: Negative for chest pain and palpitations.  Gastrointestinal: as per HPI Genitourinary: Negative for dysuria, urgency, frequency and hematuria.  Musculoskeletal: Negative for myalgias, back pain and joint pain.  Skin: Negative for itching and rash.  Neurological: Negative for dizziness, tremors, focal weakness, seizures and loss of consciousness.  Endo/Heme/Allergies: Negative for environmental allergies.  Psychiatric/Behavioral: Negative for depression, suicidal ideas and hallucinations.  All other systems reviewed and are negative.   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 Neck:     No masses; no thyromegaly Lungs:    Clear to auscultation bilaterally; normal respiratory effort CV:         Regular rate and rhythm; no murmurs Abd:      + bowel sounds; soft, non-tender; no palpable masses, no distension Ext:    No edema; adequate peripheral perfusion Skin:      Warm and dry; no rash Neuro: alert and oriented x  3 Psych: normal mood and affect  Data Reviewed:  Reviewed all labs and discussed results with patient   Assessment and Plan/Recommendations: 54 year old African American female post orthopic liver transplant at [facility] in 1992 for autoimmune hepatitis here for routine follow up visit 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 Denzil Magnuson ,  MD [phone] Mon-Fri 8a-5p [phone] after 5p, weekends, holidays

## 2015-04-15 NOTE — Patient Instructions (Addendum)
Follow up after your appointment at [facility] in March Make sure your records are faxed over to Dr Silverio Decamp at [phone]

## 2015-05-11 ENCOUNTER — Other Ambulatory Visit: Payer: Self-pay | Admitting: Gastroenterology

## 2015-05-11 ENCOUNTER — Other Ambulatory Visit (INDEPENDENT_AMBULATORY_CARE_PROVIDER_SITE_OTHER): Payer: Medicare Other

## 2015-05-11 DIAGNOSIS — Z418 Encounter for other procedures for purposes other than remedying health state: Secondary | ICD-10-CM | POA: Diagnosis not present

## 2015-05-11 DIAGNOSIS — Z944 Liver transplant status: Secondary | ICD-10-CM

## 2015-05-11 DIAGNOSIS — Z298 Encounter for other specified prophylactic measures: Secondary | ICD-10-CM

## 2015-05-11 LAB — COMPREHENSIVE METABOLIC PANEL
ALBUMIN: 3.9 g/dL (ref 3.5–5.2)
ALK PHOS: 114 U/L (ref 39–117)
ALT: 29 U/L (ref 0–35)
AST: 15 U/L (ref 0–37)
BILIRUBIN TOTAL: 0.4 mg/dL (ref 0.2–1.2)
BUN: 19 mg/dL (ref 6–23)
CO2: 26 mEq/L (ref 19–32)
CREATININE: 1.34 mg/dL — AB (ref 0.40–1.20)
Calcium: 9.3 mg/dL (ref 8.4–10.5)
Chloride: 108 mEq/L (ref 96–112)
GFR: 43.65 mL/min — ABNORMAL LOW (ref >60.00)
Glucose, Bld: 107 mg/dL — ABNORMAL HIGH (ref 70–99)
POTASSIUM: 4 meq/L (ref 3.5–5.1)
SODIUM: 142 meq/L (ref 135–145)
TOTAL PROTEIN: 6.7 g/dL (ref 6.0–8.3)

## 2015-05-11 LAB — CBC WITH DIFFERENTIAL/PLATELET
BASOS ABS: 0 10*3/uL (ref 0.0–0.1)
BASOS PCT: 0.4 % (ref 0.0–3.0)
EOS ABS: 0.2 10*3/uL (ref 0.0–0.7)
Eosinophils Relative: 3.1 % (ref 0.0–5.0)
HCT: 43.5 % (ref 36.0–46.0)
HEMOGLOBIN: 14.2 g/dL (ref 12.0–15.0)
LYMPHS ABS: 2.5 10*3/uL (ref 0.7–4.0)
Lymphocytes Relative: 36.8 % (ref 12.0–46.0)
MCHC: 32.6 g/dL (ref 30.0–36.0)
MCV: 88.7 fl (ref 78.0–100.0)
MONO ABS: 0.6 10*3/uL (ref 0.1–1.0)
Monocytes Relative: 8.9 % (ref 3.0–12.0)
NEUTROS ABS: 3.5 10*3/uL (ref 1.4–7.7)
Neutrophils Relative %: 50.8 % (ref 43.0–77.0)
Platelets: 223 10*3/uL (ref 150.0–400.0)
RBC: 4.9 Mil/uL (ref 3.87–5.11)
RDW: 14.1 % (ref 11.5–15.5)
WBC: 6.9 10*3/uL (ref 4.0–10.5)

## 2015-05-16 LAB — TACROLIMUS LEVEL: TACROLIMUS LVL: 3.9 ng/mL — AB (ref 5.0–20.0)

## 2015-05-24 ENCOUNTER — Telehealth: Payer: Self-pay | Admitting: Gastroenterology

## 2015-05-24 MED ORDER — POTASSIUM CHLORIDE CRYS ER 20 MEQ PO TBCR: EXTENDED_RELEASE_TABLET | ORAL | Status: DC

## 2015-05-24 NOTE — Telephone Encounter (Signed)
Potassium sent to [facility]. Called patient to inform

## 2015-06-10 ENCOUNTER — Telehealth: Payer: Self-pay | Admitting: Gastroenterology

## 2015-06-10 MED ORDER — POTASSIUM CHLORIDE CRYS ER 20 MEQ PO TBCR: EXTENDED_RELEASE_TABLET | ORAL | Status: DC

## 2015-06-10 NOTE — Telephone Encounter (Signed)
Called patient , she said pharmacy only dispensed 36 pills to her of her potassium  Informed the patient by our prescription history  Went sent in 60 tablets for her on 12/20  Resent rx for potassium for 60 tablets, tried to contact [employer] and their phone will not let you pick a prompt it isnt working so sent script electronically

## 2015-07-29 ENCOUNTER — Other Ambulatory Visit: Payer: Self-pay | Admitting: Gastroenterology

## 2015-07-29 ENCOUNTER — Other Ambulatory Visit (INDEPENDENT_AMBULATORY_CARE_PROVIDER_SITE_OTHER): Payer: Medicare Other

## 2015-07-29 DIAGNOSIS — Z418 Encounter for other procedures for purposes other than remedying health state: Secondary | ICD-10-CM | POA: Diagnosis not present

## 2015-07-29 DIAGNOSIS — Z298 Encounter for other specified prophylactic measures: Secondary | ICD-10-CM

## 2015-07-29 DIAGNOSIS — Z944 Liver transplant status: Secondary | ICD-10-CM

## 2015-07-29 LAB — COMPREHENSIVE METABOLIC PANEL
ALK PHOS: 89 U/L (ref 39–117)
ALT: 28 U/L (ref 0–35)
AST: 20 U/L (ref 0–37)
Albumin: 4 g/dL (ref 3.5–5.2)
BILIRUBIN TOTAL: 0.4 mg/dL (ref 0.2–1.2)
BUN: 21 mg/dL (ref 6–23)
CO2: 26 meq/L (ref 19–32)
CREATININE: 1.47 mg/dL — AB (ref 0.40–1.20)
Calcium: 9.3 mg/dL (ref 8.4–10.5)
Chloride: 106 mEq/L (ref 96–112)
GFR: 39.19 mL/min — AB (ref >60.00)
GLUCOSE: 154 mg/dL — AB (ref 70–99)
Potassium: 4.1 mEq/L (ref 3.5–5.1)
Sodium: 140 mEq/L (ref 135–145)
TOTAL PROTEIN: 6.8 g/dL (ref 6.0–8.3)

## 2015-07-29 LAB — CBC WITH DIFFERENTIAL/PLATELET
BASOS ABS: 0 10*3/uL (ref 0.0–0.1)
Basophils Relative: 0.5 % (ref 0.0–3.0)
EOS ABS: 0.2 10*3/uL (ref 0.0–0.7)
Eosinophils Relative: 3.4 % (ref 0.0–5.0)
HCT: 40.5 % (ref 36.0–46.0)
Hemoglobin: 13.7 g/dL (ref 12.0–15.0)
LYMPHS ABS: 2.2 10*3/uL (ref 0.7–4.0)
Lymphocytes Relative: 33.2 % (ref 12.0–46.0)
MCHC: 33.7 g/dL (ref 30.0–36.0)
MCV: 87.3 fl (ref 78.0–100.0)
MONO ABS: 0.4 10*3/uL (ref 0.1–1.0)
MONOS PCT: 6 % (ref 3.0–12.0)
NEUTROS ABS: 3.8 10*3/uL (ref 1.4–7.7)
Neutrophils Relative %: 56.9 % (ref 43.0–77.0)
PLATELETS: 158 10*3/uL (ref 150.0–400.0)
RBC: 4.65 Mil/uL (ref 3.87–5.11)
RDW: 14 % (ref 11.5–15.5)
WBC: 6.8 10*3/uL (ref 4.0–10.5)

## 2015-07-31 LAB — TACROLIMUS LEVEL: Tacrolimus Lvl: 4.6 ng/mL — ABNORMAL LOW (ref 5.0–20.0)

## 2015-08-01 ENCOUNTER — Ambulatory Visit (INDEPENDENT_AMBULATORY_CARE_PROVIDER_SITE_OTHER): Payer: Medicare Other

## 2015-08-01 ENCOUNTER — Ambulatory Visit (INDEPENDENT_AMBULATORY_CARE_PROVIDER_SITE_OTHER): Payer: Medicare Other | Admitting: Family Medicine

## 2015-08-01 VITALS — BP 144/84 | HR 87 | Temp 98.6°F | Resp 18 | Ht 64.0 in | Wt 186.0 lb

## 2015-08-01 DIAGNOSIS — M542 Cervicalgia: Secondary | ICD-10-CM | POA: Diagnosis not present

## 2015-08-01 DIAGNOSIS — R0782 Intercostal pain: Secondary | ICD-10-CM

## 2015-08-01 DIAGNOSIS — M5030 Other cervical disc degeneration, unspecified cervical region: Secondary | ICD-10-CM

## 2015-08-01 LAB — POCT CBC
GRANULOCYTE PERCENT: 68.7 % (ref 37–80)
HEMATOCRIT: 41.8 % (ref 37.7–47.9)
HEMOGLOBIN: 14.2 g/dL (ref 12.2–16.2)
LYMPH, POC: 2.1 (ref 0.6–3.4)
MCH, POC: 29.9 pg (ref 27–31.2)
MCHC: 33.9 g/dL (ref 31.8–35.4)
MCV: 88.1 fL (ref 80–97)
MID (cbc): 0.1 (ref 0–0.9)
MPV: 9 fL (ref 0–99.8)
POC GRANULOCYTE: 4.9 (ref 2–6.9)
POC LYMPH %: 29.8 % (ref 10–50)
POC MID %: 1.5 % (ref 0–12)
Platelet Count, POC: 141 10*3/uL — AB (ref 142–424)
RBC: 4.74 M/uL (ref 4.04–5.48)
RDW, POC: 13.3 %
WBC: 7.2 10*3/uL (ref 4.6–10.2)

## 2015-08-01 LAB — COMPREHENSIVE METABOLIC PANEL
ALT: 26 U/L (ref 6–29)
AST: 23 U/L (ref 10–35)
Albumin: 4.1 g/dL (ref 3.6–5.1)
Alkaline Phosphatase: 101 U/L (ref 33–130)
BUN: 16 mg/dL (ref 7–25)
CHLORIDE: 104 mmol/L (ref 98–110)
CO2: 24 mmol/L (ref 20–31)
Calcium: 9.4 mg/dL (ref 8.6–10.4)
Creat: 1.34 mg/dL — ABNORMAL HIGH (ref 0.50–1.05)
GLUCOSE: 125 mg/dL — AB (ref 65–99)
POTASSIUM: 4.3 mmol/L (ref 3.5–5.3)
Sodium: 137 mmol/L (ref 135–146)
Total Bilirubin: 0.3 mg/dL (ref 0.2–1.2)
Total Protein: 6.7 g/dL (ref 6.1–8.1)

## 2015-08-01 MED ORDER — CYCLOBENZAPRINE HCL 5 MG PO TABS: 5.0000 mg | ORAL_TABLET | Freq: Three times a day (TID) | ORAL | Status: DC | PRN

## 2015-08-01 NOTE — Progress Notes (Signed)
Subjective:    Patient ID: Cassandra [name], female    DOB: [birth year], 55 y.o.   MRN: [id number]  08/01/2015  Hypertension; Neck Pain; and Chest Pain   HPI This 55 y.o. female presents for evaluation of neck pain, B shoulder pain, chest pain.  Onset two days ago.  Had checked BP earlier today and 169/104.  Also having intermittent chest pain; tightness in chest and B arms.   Has a known disc in cervical spine.  Onset two days ago.  Driving makes worse.  Very intense.  Stress might be a trigger.  Neck movement does not seem to help.  Walking or exertion causes SOB; thought might be pollen. No major exertion lately; +fatigue.  Last night was sweaty and clammy. No fever; +chills.  +HA.  No nausea.  Taking Aleve with some relief; ran out of pain medication; ready today.  Pain is intermittent but today has been more severe.  DDD cervical since 1999; no spine specialist right now.  Dr. Jonni Sanger had referred patient for xrays.  Severity 10/10 especially while driving; hands are cramping up B.  Changing positions and rolling over in bed makes in bed; has been tossing and turning in bed. Gradual onset.  Has been tightening up in upper extremities; has been occurring while driving.  Hands will turn up.  Ran out oxycodone last week; going to pick up tomorrow.  Taking oxycodone sparingly; no pain medication in several days because out.  Hot soaks has relieved pain. Decreased Prednisone last year.  Does not want to increase Prednisone.  Blood pressure runs [id number] systolic at home. No exercise.    Review of Systems  Per HPI.   Past Medical History  Diagnosis Date  . Hx of adenomatous colonic polyps   . External hemorrhoids   . Iron deficiency anemia   . Internal hemorrhoids   . Depression   . Anxiety   . Cirrhosis ([place])   . Autoimmune hepatitis ([place])   . GERD (gastroesophageal reflux disease)   . Fibromyalgia   . Chronic renal insufficiency   . Generalized headaches   . Hyperlipidemia   . 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 Liver replaced by transplant ([place]) 02/22/2009    Qualifier: Diagnosis of  By: Chester Holstein NP, Nevin Bloodgood    . CIRRHOSIS 09/16/2007    Qualifier: Diagnosis of  By: Nelson-Danthony Kendrix CMA (AAMA), [name]    . ANXIETY 09/16/2007    Qualifier: History of  By: Harlon Ditty CMA (AAMA), [name]    . ANEMIA, IRON DEFICIENCY 09/16/2007    Qualifier: Diagnosis of  By: Nelson-Marlina Cataldi CMA (AAMA), [name]    . COLONIC POLYPS, ADENOMATOUS, HX OF 05/05/2008    Qualifier: Diagnosis of  By: Nelson-Zelene Barga CMA (AAMA), [name]     Past Surgical History  Procedure Laterality Date  . Liver transplant  1992  . Knee arthroscopy    . Rotator cuff repair    . Abdominal hysterectomy     Allergies  Allergen Reactions  . Other Nausea Only and Other (See Comments)    Muscle Cramps Nausea Diarrhea Uncoded Allergy. Allergen: vioxx  . Statins Other (See Comments)    myalgias  . Erythromycin     REACTION: Nausea/vomiting  . Nsaids     REACTION: GI Cramping/nausea  . Rofecoxib     REACTION: Nausea/vomiting    Social History   Social History  . Marital Status: Single    Spouse Name: N/A  . Number of Children: N/A  . Years  of Education: N/A   Occupational History  . Not on file.   Social History Main Topics  . Smoking status: Never Smoker   . Smokeless tobacco: Never Used  . Alcohol Use: No  . Drug Use: No  . Sexual Activity: Not on file   Other Topics Concern  . Not on file   Social History Narrative   Family History  Problem Relation Age of Onset  . Diabetes Brother   . Heart disease Sister   . Hypertension Sister   . Colon cancer Neg Hx   . Diabetes Mother   . Dementia Mother        Objective:    BP 144/84 mmHg  Pulse 87  Temp(Src) 98.6 F (37 C) (Oral)  Resp 18  Ht 5\' 4"  (1.626 m)  Wt 186 lb (84.369 kg)  BMI 31.91 kg/m2  SpO2 97% Physical Exam  Constitutional: She is oriented to person, place, and time. She appears well-developed and well-nourished. No distress.    HENT:  Head: Normocephalic and atraumatic.  Right Ear: External ear normal.  Left Ear: External ear normal.  Nose: Nose normal.  Mouth/Throat: Oropharynx is clear and moist.  Eyes: Conjunctivae and EOM are normal. Pupils are equal, round, and reactive to light.  Neck: Normal range of motion. Neck supple. Carotid bruit is not present. No thyromegaly present.  Cardiovascular: Normal rate, regular rhythm, normal heart sounds and intact distal pulses.  Exam reveals no gallop and no friction rub.   No murmur heard. Pulmonary/Chest: Effort normal and breath sounds normal. She has no wheezes. She has no rales. She exhibits tenderness.  Abdominal: Soft. Bowel sounds are normal. She exhibits no distension and no mass. There is no tenderness. There is no rebound and no guarding.  Musculoskeletal:       Right shoulder: Normal.       Left shoulder: Normal.       Cervical back: She exhibits decreased range of motion, tenderness, pain and spasm. She exhibits no bony tenderness.  Lymphadenopathy:    She has no cervical adenopathy.  Neurological: She is alert and oriented to person, place, and time. No cranial nerve deficit.  Skin: Skin is warm and dry. No rash noted. She is not diaphoretic. No erythema. No pallor.  Psychiatric: She has a normal mood and affect. Her behavior is normal.        Assessment & Plan:   1. Neck pain   2. Degenerative disc disease, cervical   3. Intercostal pain     Orders Placed This Encounter  Procedures  . DG Cervical Spine Complete    Standing Status: Future     Number of Occurrences: 1     Standing Expiration Date: 07/31/2016    Order Specific Question:  Reason for Exam (SYMPTOM  OR DIAGNOSIS REQUIRED)    Answer:  neck pain; known DDD cervical    Order Specific Question:  Is the patient pregnant?    Answer:  No    Order Specific Question:  Preferred imaging location?    Answer:  External  . DG Chest 2 View    Standing Status: Future     Number of  Occurrences: 1     Standing Expiration Date: 07/31/2016    Order Specific Question:  Reason for Exam (SYMPTOM  OR DIAGNOSIS REQUIRED)    Answer:  anterior chest pain with neck pain    Order Specific Question:  Is the patient pregnant?    Answer:  No  Order Specific Question:  Preferred imaging location?    Answer:  External  . Comprehensive metabolic panel  . POCT CBC  . EKG 12-Lead   Meds ordered this encounter  Medications  . cyclobenzaprine (FLEXERIL) 5 MG tablet    Sig: Take 1 tablet (5 mg total) by mouth 3 (three) times daily as needed for muscle spasms.    Dispense:  40 tablet    Refill:  0    Return if symptoms worsen or fail to improve.    Jakobee Brackins Elayne Guerin, M.D. [facility] [address] [place], [place]  [address] [phone] phone [phone] fax

## 2015-08-02 ENCOUNTER — Telehealth: Payer: Self-pay

## 2015-08-15 ENCOUNTER — Telehealth: Payer: Self-pay | Admitting: *Deleted

## 2015-08-15 DIAGNOSIS — K7460 Unspecified cirrhosis of liver: Secondary | ICD-10-CM

## 2015-08-15 DIAGNOSIS — K759 Inflammatory liver disease, unspecified: Secondary | ICD-10-CM

## 2015-08-15 DIAGNOSIS — D649 Anemia, unspecified: Secondary | ICD-10-CM

## 2015-08-15 NOTE — Telephone Encounter (Signed)
We sent in script for potassium twice a day alternating with one every other day. That is how we had prescribed it. But patient told pharmacy we told her to take it twice a day and now she is out of her medicine. Can I send her in a new script for twice a day so she can ger her refills?

## 2015-08-16 NOTE — Telephone Encounter (Signed)
Called patient to inform holding the potassum for 1 week then having a potassium level drawn in one week. No answer left message for her to call me back  Order is in for 1 week potassium level to be drawn

## 2015-08-16 NOTE — Telephone Encounter (Signed)
Her Potassium level is normal. I dont recall instructing her to increase the potassium dose. She may not need potassium supplements. Can Recheck potassium level after a week she stops taking to make sure her level remains within normal range.

## 2015-08-31 NOTE — Telephone Encounter (Signed)
Called patient four different times. Left Messages with no return call back.Marland Kitchen

## 2015-09-01 ENCOUNTER — Other Ambulatory Visit: Payer: Self-pay | Admitting: Family Medicine

## 2015-09-23 ENCOUNTER — Telehealth: Payer: Self-pay | Admitting: Gastroenterology

## 2015-09-23 ENCOUNTER — Other Ambulatory Visit: Payer: Self-pay

## 2015-09-23 MED ORDER — POTASSIUM CHLORIDE CRYS ER 20 MEQ PO TBCR: EXTENDED_RELEASE_TABLET | ORAL | Status: DC

## 2015-09-26 ENCOUNTER — Other Ambulatory Visit: Payer: Self-pay

## 2015-09-26 DIAGNOSIS — Z298 Encounter for other specified prophylactic measures: Secondary | ICD-10-CM

## 2015-09-26 DIAGNOSIS — Z944 Liver transplant status: Secondary | ICD-10-CM

## 2015-10-25 ENCOUNTER — Telehealth: Payer: Self-pay | Admitting: Gastroenterology

## 2015-10-25 ENCOUNTER — Other Ambulatory Visit (INDEPENDENT_AMBULATORY_CARE_PROVIDER_SITE_OTHER): Payer: Medicare Other

## 2015-10-25 ENCOUNTER — Other Ambulatory Visit: Payer: Self-pay | Admitting: Gastroenterology

## 2015-10-25 ENCOUNTER — Other Ambulatory Visit: Payer: Self-pay

## 2015-10-25 DIAGNOSIS — Z944 Liver transplant status: Secondary | ICD-10-CM | POA: Diagnosis not present

## 2015-10-25 DIAGNOSIS — K759 Inflammatory liver disease, unspecified: Secondary | ICD-10-CM

## 2015-10-25 DIAGNOSIS — D649 Anemia, unspecified: Secondary | ICD-10-CM

## 2015-10-25 DIAGNOSIS — Z298 Encounter for other specified prophylactic measures: Secondary | ICD-10-CM

## 2015-10-25 DIAGNOSIS — K7460 Unspecified cirrhosis of liver: Secondary | ICD-10-CM

## 2015-10-25 DIAGNOSIS — Z418 Encounter for other procedures for purposes other than remedying health state: Secondary | ICD-10-CM | POA: Diagnosis not present

## 2015-10-25 LAB — CBC WITH DIFFERENTIAL/PLATELET
BASOS ABS: 0.1 10*3/uL (ref 0.0–0.1)
BASOS PCT: 0.9 % (ref 0.0–3.0)
EOS ABS: 0.3 10*3/uL (ref 0.0–0.7)
Eosinophils Relative: 5.2 % — ABNORMAL HIGH (ref 0.0–5.0)
HCT: 39.3 % (ref 36.0–46.0)
HEMOGLOBIN: 13 g/dL (ref 12.0–15.0)
LYMPHS PCT: 41.2 % (ref 12.0–46.0)
Lymphs Abs: 2.6 10*3/uL (ref 0.7–4.0)
MCHC: 33.2 g/dL (ref 30.0–36.0)
MCV: 87.6 fl (ref 78.0–100.0)
MONO ABS: 0.5 10*3/uL (ref 0.1–1.0)
Monocytes Relative: 7.8 % (ref 3.0–12.0)
Neutro Abs: 2.8 10*3/uL (ref 1.4–7.7)
Neutrophils Relative %: 44.9 % (ref 43.0–77.0)
Platelets: 227 10*3/uL (ref 150.0–400.0)
RBC: 4.49 Mil/uL (ref 3.87–5.11)
RDW: 13.9 % (ref 11.5–15.5)
WBC: 6.3 10*3/uL (ref 4.0–10.5)

## 2015-10-25 LAB — COMPREHENSIVE METABOLIC PANEL
ALBUMIN: 4.1 g/dL (ref 3.5–5.2)
ALK PHOS: 103 U/L (ref 39–117)
ALT: 22 U/L (ref 0–35)
AST: 12 U/L (ref 0–37)
BILIRUBIN TOTAL: 0.4 mg/dL (ref 0.2–1.2)
BUN: 23 mg/dL (ref 6–23)
CALCIUM: 9.5 mg/dL (ref 8.4–10.5)
CO2: 24 mEq/L (ref 19–32)
CREATININE: 1.28 mg/dL — AB (ref 0.40–1.20)
Chloride: 108 mEq/L (ref 96–112)
GFR: 45.94 mL/min — ABNORMAL LOW (ref >60.00)
Glucose, Bld: 68 mg/dL — ABNORMAL LOW (ref 70–99)
Potassium: 3.5 mEq/L (ref 3.5–5.1)
SODIUM: 140 meq/L (ref 135–145)
TOTAL PROTEIN: 6.5 g/dL (ref 6.0–8.3)

## 2015-10-25 LAB — POTASSIUM: Potassium: 3.5 mEq/L (ref 3.5–5.1)

## 2015-10-25 MED ORDER — POTASSIUM CHLORIDE CRYS ER 20 MEQ PO TBCR: EXTENDED_RELEASE_TABLET | ORAL | Status: DC

## 2015-10-26 LAB — IGG: IgG (Immunoglobin G), Serum: 792 mg/dL (ref 700–1600)

## 2015-10-26 LAB — TACROLIMUS LEVEL: TACROLIMUS LVL: 3.1 ng/mL — AB (ref 5.0–20.0)

## 2016-01-18 ENCOUNTER — Other Ambulatory Visit (INDEPENDENT_AMBULATORY_CARE_PROVIDER_SITE_OTHER): Payer: Medicare Other

## 2016-01-18 ENCOUNTER — Other Ambulatory Visit: Payer: Self-pay | Admitting: Gastroenterology

## 2016-01-18 DIAGNOSIS — Z298 Encounter for other specified prophylactic measures: Secondary | ICD-10-CM

## 2016-01-18 DIAGNOSIS — Z418 Encounter for other procedures for purposes other than remedying health state: Secondary | ICD-10-CM

## 2016-01-18 DIAGNOSIS — Z944 Liver transplant status: Secondary | ICD-10-CM | POA: Diagnosis not present

## 2016-01-18 LAB — COMPREHENSIVE METABOLIC PANEL
ALBUMIN: 3.9 g/dL (ref 3.5–5.2)
ALT: 45 U/L — AB (ref 0–35)
AST: 17 U/L (ref 0–37)
Alkaline Phosphatase: 121 U/L — ABNORMAL HIGH (ref 39–117)
BILIRUBIN TOTAL: 0.3 mg/dL (ref 0.2–1.2)
BUN: 19 mg/dL (ref 6–23)
CALCIUM: 8.6 mg/dL (ref 8.4–10.5)
CHLORIDE: 109 meq/L (ref 96–112)
CO2: 20 mEq/L (ref 19–32)
CREATININE: 1.29 mg/dL — AB (ref 0.40–1.20)
GFR: 45.49 mL/min — ABNORMAL LOW (ref >60.00)
Glucose, Bld: 100 mg/dL — ABNORMAL HIGH (ref 70–99)
Potassium: 3.9 mEq/L (ref 3.5–5.1)
SODIUM: 138 meq/L (ref 135–145)
TOTAL PROTEIN: 6.4 g/dL (ref 6.0–8.3)

## 2016-01-18 LAB — CBC WITH DIFFERENTIAL/PLATELET
BASOS ABS: 0 10*3/uL (ref 0.0–0.1)
BASOS PCT: 0.6 % (ref 0.0–3.0)
EOS ABS: 0.2 10*3/uL (ref 0.0–0.7)
Eosinophils Relative: 2.7 % (ref 0.0–5.0)
HCT: 40.2 % (ref 36.0–46.0)
HEMOGLOBIN: 13.5 g/dL (ref 12.0–15.0)
Lymphocytes Relative: 42.2 % (ref 12.0–46.0)
Lymphs Abs: 2.5 10*3/uL (ref 0.7–4.0)
MCHC: 33.5 g/dL (ref 30.0–36.0)
MCV: 87 fl (ref 78.0–100.0)
MONO ABS: 0.8 10*3/uL (ref 0.1–1.0)
Monocytes Relative: 13.2 % — ABNORMAL HIGH (ref 3.0–12.0)
NEUTROS PCT: 41.3 % — AB (ref 43.0–77.0)
Neutro Abs: 2.5 10*3/uL (ref 1.4–7.7)
PLATELETS: 225 10*3/uL (ref 150.0–400.0)
RBC: 4.61 Mil/uL (ref 3.87–5.11)
RDW: 13.3 % (ref 11.5–15.5)
WBC: 6 10*3/uL (ref 4.0–10.5)

## 2016-01-19 LAB — TACROLIMUS LEVEL: TACROLIMUS LVL: 2.2 ng/mL — AB (ref 5.0–20.0)

## 2016-02-03 ENCOUNTER — Other Ambulatory Visit: Payer: Self-pay

## 2016-02-03 ENCOUNTER — Other Ambulatory Visit: Payer: Self-pay | Admitting: Gastroenterology

## 2016-02-03 ENCOUNTER — Other Ambulatory Visit (INDEPENDENT_AMBULATORY_CARE_PROVIDER_SITE_OTHER): Payer: Medicare Other

## 2016-02-03 DIAGNOSIS — Z9489 Other transplanted organ and tissue status: Secondary | ICD-10-CM | POA: Diagnosis not present

## 2016-02-03 LAB — CBC WITH DIFFERENTIAL/PLATELET
BASOS ABS: 0 10*3/uL (ref 0.0–0.1)
Basophils Relative: 0 % (ref 0.0–3.0)
EOS ABS: 0.3 10*3/uL (ref 0.0–0.7)
EOS PCT: 5.2 % — AB (ref 0.0–5.0)
HCT: 40.3 % (ref 36.0–46.0)
HEMOGLOBIN: 13.5 g/dL (ref 12.0–15.0)
LYMPHS ABS: 2.6 10*3/uL (ref 0.7–4.0)
Lymphocytes Relative: 45.1 % (ref 12.0–46.0)
MCHC: 33.6 g/dL (ref 30.0–36.0)
MCV: 86.9 fl (ref 78.0–100.0)
MONO ABS: 0.5 10*3/uL (ref 0.1–1.0)
Monocytes Relative: 7.9 % (ref 3.0–12.0)
NEUTROS PCT: 41.8 % — AB (ref 43.0–77.0)
Neutro Abs: 2.4 10*3/uL (ref 1.4–7.7)
Platelets: 173 10*3/uL (ref 150.0–400.0)
RBC: 4.63 Mil/uL (ref 3.87–5.11)
RDW: 13.5 % (ref 11.5–15.5)
WBC: 5.8 10*3/uL (ref 4.0–10.5)

## 2016-02-03 LAB — COMPREHENSIVE METABOLIC PANEL
ALK PHOS: 111 U/L (ref 39–117)
ALT: 50 U/L — ABNORMAL HIGH (ref 0–35)
AST: 35 U/L (ref 0–37)
Albumin: 3.8 g/dL (ref 3.5–5.2)
BUN: 14 mg/dL (ref 6–23)
CO2: 23 mEq/L (ref 19–32)
CREATININE: 1.2 mg/dL (ref 0.40–1.20)
Calcium: 8.5 mg/dL (ref 8.4–10.5)
Chloride: 109 mEq/L (ref 96–112)
GFR: 49.44 mL/min — ABNORMAL LOW (ref >60.00)
GLUCOSE: 130 mg/dL — AB (ref 70–99)
POTASSIUM: 3.8 meq/L (ref 3.5–5.1)
SODIUM: 141 meq/L (ref 135–145)
TOTAL PROTEIN: 6.4 g/dL (ref 6.0–8.3)
Total Bilirubin: 0.4 mg/dL (ref 0.2–1.2)

## 2016-02-04 LAB — TACROLIMUS LEVEL: Tacrolimus Lvl: 5.2 ng/mL (ref 5.0–20.0)

## 2016-02-08 ENCOUNTER — Telehealth: Payer: Self-pay | Admitting: Gastroenterology

## 2016-02-08 NOTE — Telephone Encounter (Signed)
Labs and tacrolimus level fax to [facility] Transplant team

## 2016-02-13 ENCOUNTER — Other Ambulatory Visit: Payer: Self-pay

## 2016-02-13 DIAGNOSIS — B251 Cytomegaloviral hepatitis: Secondary | ICD-10-CM

## 2016-02-13 DIAGNOSIS — K754 Autoimmune hepatitis: Secondary | ICD-10-CM

## 2016-02-13 DIAGNOSIS — Z298 Encounter for other specified prophylactic measures: Secondary | ICD-10-CM

## 2016-02-13 DIAGNOSIS — Z944 Liver transplant status: Secondary | ICD-10-CM

## 2016-03-02 ENCOUNTER — Other Ambulatory Visit (INDEPENDENT_AMBULATORY_CARE_PROVIDER_SITE_OTHER): Payer: Medicare Other

## 2016-03-02 DIAGNOSIS — B251 Cytomegaloviral hepatitis: Secondary | ICD-10-CM

## 2016-03-02 DIAGNOSIS — K754 Autoimmune hepatitis: Secondary | ICD-10-CM | POA: Diagnosis not present

## 2016-03-02 DIAGNOSIS — Z944 Liver transplant status: Secondary | ICD-10-CM | POA: Diagnosis not present

## 2016-03-02 DIAGNOSIS — Z298 Encounter for other specified prophylactic measures: Secondary | ICD-10-CM | POA: Diagnosis not present

## 2016-03-02 LAB — COMPREHENSIVE METABOLIC PANEL
ALK PHOS: 90 U/L (ref 39–117)
ALT: 14 U/L (ref 0–35)
AST: 15 U/L (ref 0–37)
Albumin: 3.9 g/dL (ref 3.5–5.2)
BUN: 16 mg/dL (ref 6–23)
CO2: 24 meq/L (ref 19–32)
Calcium: 9.1 mg/dL (ref 8.4–10.5)
Chloride: 108 mEq/L (ref 96–112)
Creatinine, Ser: 1.24 mg/dL — ABNORMAL HIGH (ref 0.40–1.20)
GFR: 47.59 mL/min — AB (ref >60.00)
GLUCOSE: 96 mg/dL (ref 70–99)
POTASSIUM: 4 meq/L (ref 3.5–5.1)
SODIUM: 139 meq/L (ref 135–145)
TOTAL PROTEIN: 7 g/dL (ref 6.0–8.3)
Total Bilirubin: 0.6 mg/dL (ref 0.2–1.2)

## 2016-03-02 LAB — CBC WITH DIFFERENTIAL/PLATELET
BASOS ABS: 0 10*3/uL (ref 0.0–0.1)
BASOS PCT: 0.5 % (ref 0.0–3.0)
EOS PCT: 5.6 % — AB (ref 0.0–5.0)
Eosinophils Absolute: 0.3 10*3/uL (ref 0.0–0.7)
HCT: 42.3 % (ref 36.0–46.0)
Hemoglobin: 14.5 g/dL (ref 12.0–15.0)
LYMPHS ABS: 2.5 10*3/uL (ref 0.7–4.0)
Lymphocytes Relative: 44 % (ref 12.0–46.0)
MCHC: 34.3 g/dL (ref 30.0–36.0)
MCV: 86.3 fl (ref 78.0–100.0)
MONO ABS: 0.5 10*3/uL (ref 0.1–1.0)
MONOS PCT: 9.4 % (ref 3.0–12.0)
NEUTROS ABS: 2.3 10*3/uL (ref 1.4–7.7)
NEUTROS PCT: 40.5 % — AB (ref 43.0–77.0)
PLATELETS: 227 10*3/uL (ref 150.0–400.0)
RBC: 4.91 Mil/uL (ref 3.87–5.11)
RDW: 13.4 % (ref 11.5–15.5)
WBC: 5.7 10*3/uL (ref 4.0–10.5)

## 2016-03-05 LAB — IGG: IgG (Immunoglobin G), Serum: 852 mg/dL (ref 694–1618)

## 2016-03-06 LAB — TACROLIMUS LEVEL: Tacrolimus Lvl: 3.5 ng/mL — ABNORMAL LOW (ref 5.0–20.0)

## 2016-03-07 ENCOUNTER — Telehealth: Payer: Self-pay | Admitting: Gastroenterology

## 2016-03-07 LAB — CMV DNA, QUANTITATIVE, PCR: CMV DNA QUANT: NEGATIVE [IU]/mL

## 2016-03-07 NOTE — Telephone Encounter (Signed)
Labs faxed to her transplant team. Copy of the labs to patient.

## 2016-03-13 ENCOUNTER — Telehealth: Payer: Self-pay | Admitting: *Deleted

## 2016-03-13 NOTE — Telephone Encounter (Signed)
Dr Silverio Decamp, I received a fax from [facility] concerning patient not having a FLU shot, Just wanted to make you aware and Ill contact the patient to inform her or to see if she has already had one somewhere else    If she has not had one can she come in the office here for a flu shot? Or go to her PCP?

## 2016-03-13 NOTE — Telephone Encounter (Signed)
Per Patient this fax needed to go to Dr Jonni Sanger her PCP   Will forward over [facility] fax to Dr Jonni Sanger per patients request

## 2016-03-13 NOTE — Telephone Encounter (Signed)
Yes, please make sure she gets the flu shot.

## 2016-03-14 NOTE — Telephone Encounter (Signed)
[facility] faxed to Dr Tana Felts office today at 3:26pm

## 2016-03-14 NOTE — Telephone Encounter (Signed)
Pt said that Dr. Tamela Oddi fax# is [phone]

## 2016-04-13 ENCOUNTER — Other Ambulatory Visit: Payer: Self-pay | Admitting: Gastroenterology

## 2016-04-13 NOTE — Telephone Encounter (Signed)
Dr Silverio Decamp can I refill patients Potassium

## 2016-04-19 ENCOUNTER — Other Ambulatory Visit: Payer: Self-pay

## 2016-04-19 ENCOUNTER — Other Ambulatory Visit (INDEPENDENT_AMBULATORY_CARE_PROVIDER_SITE_OTHER): Payer: Medicare Other

## 2016-04-19 ENCOUNTER — Other Ambulatory Visit: Payer: Self-pay | Admitting: Gastroenterology

## 2016-04-19 DIAGNOSIS — Z944 Liver transplant status: Secondary | ICD-10-CM

## 2016-04-19 LAB — COMPREHENSIVE METABOLIC PANEL
ALBUMIN: 4 g/dL (ref 3.5–5.2)
ALK PHOS: 114 U/L (ref 39–117)
ALT: 49 U/L — ABNORMAL HIGH (ref 0–35)
AST: 19 U/L (ref 0–37)
BUN: 16 mg/dL (ref 6–23)
CALCIUM: 9.1 mg/dL (ref 8.4–10.5)
CHLORIDE: 107 meq/L (ref 96–112)
CO2: 26 mEq/L (ref 19–32)
Creatinine, Ser: 1.27 mg/dL — ABNORMAL HIGH (ref 0.40–1.20)
GFR: 46.28 mL/min — ABNORMAL LOW (ref >60.00)
Glucose, Bld: 93 mg/dL (ref 70–99)
POTASSIUM: 3.8 meq/L (ref 3.5–5.1)
SODIUM: 141 meq/L (ref 135–145)
TOTAL PROTEIN: 6.4 g/dL (ref 6.0–8.3)
Total Bilirubin: 0.5 mg/dL (ref 0.2–1.2)

## 2016-04-19 LAB — CBC WITH DIFFERENTIAL/PLATELET
BASOS PCT: 0.6 % (ref 0.0–3.0)
Basophils Absolute: 0 10*3/uL (ref 0.0–0.1)
EOS ABS: 0.2 10*3/uL (ref 0.0–0.7)
EOS PCT: 3.9 % (ref 0.0–5.0)
HEMATOCRIT: 40.9 % (ref 36.0–46.0)
HEMOGLOBIN: 13.7 g/dL (ref 12.0–15.0)
LYMPHS PCT: 39.3 % (ref 12.0–46.0)
Lymphs Abs: 2.2 10*3/uL (ref 0.7–4.0)
MCHC: 33.5 g/dL (ref 30.0–36.0)
MCV: 86.2 fl (ref 78.0–100.0)
MONO ABS: 0.5 10*3/uL (ref 0.1–1.0)
Monocytes Relative: 8.9 % (ref 3.0–12.0)
Neutro Abs: 2.6 10*3/uL (ref 1.4–7.7)
Neutrophils Relative %: 47.3 % (ref 43.0–77.0)
Platelets: 186 10*3/uL (ref 150.0–400.0)
RBC: 4.74 Mil/uL (ref 3.87–5.11)
RDW: 13.7 % (ref 11.5–15.5)
WBC: 5.6 10*3/uL (ref 4.0–10.5)

## 2016-04-21 LAB — TACROLIMUS LEVEL: Tacrolimus Lvl: 3.6 ng/mL — ABNORMAL LOW (ref 5.0–20.0)

## 2016-05-25 ENCOUNTER — Other Ambulatory Visit (INDEPENDENT_AMBULATORY_CARE_PROVIDER_SITE_OTHER): Payer: Medicare Other

## 2016-05-25 ENCOUNTER — Other Ambulatory Visit: Payer: Self-pay | Admitting: Gastroenterology

## 2016-05-25 DIAGNOSIS — Z944 Liver transplant status: Secondary | ICD-10-CM | POA: Diagnosis not present

## 2016-05-25 LAB — COMPREHENSIVE METABOLIC PANEL
ALK PHOS: 87 U/L (ref 39–117)
ALT: 20 U/L (ref 0–35)
AST: 16 U/L (ref 0–37)
Albumin: 4 g/dL (ref 3.5–5.2)
BUN: 19 mg/dL (ref 6–23)
CHLORIDE: 108 meq/L (ref 96–112)
CO2: 28 mEq/L (ref 19–32)
Calcium: 9.3 mg/dL (ref 8.4–10.5)
Creatinine, Ser: 1.31 mg/dL — ABNORMAL HIGH (ref 0.40–1.20)
GFR: 44.63 mL/min — AB (ref >60.00)
GLUCOSE: 98 mg/dL (ref 70–99)
POTASSIUM: 4.1 meq/L (ref 3.5–5.1)
SODIUM: 142 meq/L (ref 135–145)
TOTAL PROTEIN: 6.7 g/dL (ref 6.0–8.3)
Total Bilirubin: 0.4 mg/dL (ref 0.2–1.2)

## 2016-05-25 LAB — CBC WITH DIFFERENTIAL/PLATELET
BASOS PCT: 0.6 % (ref 0.0–3.0)
Basophils Absolute: 0 10*3/uL (ref 0.0–0.1)
Eosinophils Absolute: 0.3 10*3/uL (ref 0.0–0.7)
Eosinophils Relative: 4.9 % (ref 0.0–5.0)
HCT: 39.7 % (ref 36.0–46.0)
HEMOGLOBIN: 13.3 g/dL (ref 12.0–15.0)
LYMPHS ABS: 2.5 10*3/uL (ref 0.7–4.0)
Lymphocytes Relative: 39.2 % (ref 12.0–46.0)
MCHC: 33.6 g/dL (ref 30.0–36.0)
MCV: 86.3 fl (ref 78.0–100.0)
MONOS PCT: 9 % (ref 3.0–12.0)
Monocytes Absolute: 0.6 10*3/uL (ref 0.1–1.0)
NEUTROS PCT: 46.3 % (ref 43.0–77.0)
Neutro Abs: 3 10*3/uL (ref 1.4–7.7)
Platelets: 217 10*3/uL (ref 150.0–400.0)
RBC: 4.6 Mil/uL (ref 3.87–5.11)
RDW: 13.9 % (ref 11.5–15.5)
WBC: 6.5 10*3/uL (ref 4.0–10.5)

## 2016-05-26 LAB — TACROLIMUS LEVEL: TACROLIMUS LVL: 3.6 ng/mL — AB (ref 5.0–20.0)

## 2016-08-15 ENCOUNTER — Other Ambulatory Visit: Payer: Self-pay | Admitting: Gastroenterology

## 2016-09-04 ENCOUNTER — Other Ambulatory Visit: Payer: Self-pay | Admitting: Gastroenterology

## 2016-09-04 ENCOUNTER — Other Ambulatory Visit (INDEPENDENT_AMBULATORY_CARE_PROVIDER_SITE_OTHER): Payer: Medicare Other

## 2016-09-04 DIAGNOSIS — Z944 Liver transplant status: Secondary | ICD-10-CM | POA: Diagnosis not present

## 2016-09-04 LAB — CBC WITH DIFFERENTIAL/PLATELET
Basophils Absolute: 0 10*3/uL (ref 0.0–0.1)
Basophils Relative: 0.5 % (ref 0.0–3.0)
EOS PCT: 4.4 % (ref 0.0–5.0)
Eosinophils Absolute: 0.3 10*3/uL (ref 0.0–0.7)
HCT: 41.4 % (ref 36.0–46.0)
Hemoglobin: 13.5 g/dL (ref 12.0–15.0)
LYMPHS ABS: 2.5 10*3/uL (ref 0.7–4.0)
Lymphocytes Relative: 43.4 % (ref 12.0–46.0)
MCHC: 32.7 g/dL (ref 30.0–36.0)
MCV: 87.7 fl (ref 78.0–100.0)
MONO ABS: 0.5 10*3/uL (ref 0.1–1.0)
Monocytes Relative: 8.6 % (ref 3.0–12.0)
NEUTROS PCT: 43.1 % (ref 43.0–77.0)
Neutro Abs: 2.5 10*3/uL (ref 1.4–7.7)
PLATELETS: 158 10*3/uL (ref 150.0–400.0)
RBC: 4.72 Mil/uL (ref 3.87–5.11)
RDW: 13.7 % (ref 11.5–15.5)
WBC: 5.8 10*3/uL (ref 4.0–10.5)

## 2016-09-04 LAB — COMPREHENSIVE METABOLIC PANEL
ALT: 21 U/L (ref 0–35)
AST: 22 U/L (ref 0–37)
Albumin: 3.8 g/dL (ref 3.5–5.2)
Alkaline Phosphatase: 80 U/L (ref 39–117)
BUN: 18 mg/dL (ref 6–23)
CALCIUM: 9.4 mg/dL (ref 8.4–10.5)
CHLORIDE: 109 meq/L (ref 96–112)
CO2: 26 meq/L (ref 19–32)
CREATININE: 1.31 mg/dL — AB (ref 0.40–1.20)
GFR: 44.59 mL/min — ABNORMAL LOW (ref >60.00)
Glucose, Bld: 100 mg/dL — ABNORMAL HIGH (ref 70–99)
POTASSIUM: 4.2 meq/L (ref 3.5–5.1)
SODIUM: 143 meq/L (ref 135–145)
Total Bilirubin: 0.3 mg/dL (ref 0.2–1.2)
Total Protein: 6.6 g/dL (ref 6.0–8.3)

## 2016-09-05 LAB — TACROLIMUS LEVEL: TACROLIMUS LVL: 4.1 ng/mL — AB (ref 5.0–20.0)

## 2016-09-11 ENCOUNTER — Other Ambulatory Visit: Payer: Self-pay | Admitting: Gastroenterology

## 2016-10-09 ENCOUNTER — Encounter (HOSPITAL_COMMUNITY): Payer: Self-pay

## 2016-10-09 ENCOUNTER — Emergency Department (HOSPITAL_COMMUNITY): Payer: Medicare Other

## 2016-10-09 ENCOUNTER — Emergency Department (HOSPITAL_COMMUNITY)
Admission: EM | Admit: 2016-10-09 | Discharge: 2016-10-09 | Disposition: A | Discharge status: Home / Self Care | Payer: Medicare Other | Attending: Emergency Medicine | Admitting: Emergency Medicine

## 2016-10-09 DIAGNOSIS — R202 Paresthesia of skin: Secondary | ICD-10-CM | POA: Diagnosis not present

## 2016-10-09 DIAGNOSIS — Z79899 Other long term (current) drug therapy: Secondary | ICD-10-CM | POA: Insufficient documentation

## 2016-10-09 DIAGNOSIS — R791 Abnormal coagulation profile: Secondary | ICD-10-CM | POA: Diagnosis not present

## 2016-10-09 DIAGNOSIS — G542 Cervical root disorders, not elsewhere classified: Secondary | ICD-10-CM | POA: Diagnosis not present

## 2016-10-09 DIAGNOSIS — R200 Anesthesia of skin: Secondary | ICD-10-CM | POA: Diagnosis present

## 2016-10-09 LAB — DIFFERENTIAL
BASOS PCT: 0 %
Basophils Absolute: 0 10*3/uL (ref 0.0–0.1)
EOS ABS: 0.2 10*3/uL (ref 0.0–0.7)
EOS PCT: 3 %
Lymphocytes Relative: 37 %
Lymphs Abs: 2.5 10*3/uL (ref 0.7–4.0)
MONO ABS: 0.4 10*3/uL (ref 0.1–1.0)
MONOS PCT: 7 %
NEUTROS ABS: 3.5 10*3/uL (ref 1.7–7.7)
Neutrophils Relative %: 53 %

## 2016-10-09 LAB — URINALYSIS, ROUTINE W REFLEX MICROSCOPIC
BILIRUBIN URINE: NEGATIVE
Glucose, UA: NEGATIVE mg/dL
HGB URINE DIPSTICK: NEGATIVE
Ketones, ur: NEGATIVE mg/dL
Leukocytes, UA: NEGATIVE
Nitrite: NEGATIVE
PH: 6 (ref 5.0–8.0)
Protein, ur: NEGATIVE mg/dL
SPECIFIC GRAVITY, URINE: 1.006 (ref 1.005–1.030)

## 2016-10-09 LAB — PROTIME-INR
INR: 0.97
PROTHROMBIN TIME: 12.9 s (ref 11.4–15.2)

## 2016-10-09 LAB — RAPID URINE DRUG SCREEN, HOSP PERFORMED
AMPHETAMINES: NOT DETECTED
BENZODIAZEPINES: POSITIVE — AB
Barbiturates: NOT DETECTED
COCAINE: NOT DETECTED
OPIATES: NOT DETECTED
Tetrahydrocannabinol: NOT DETECTED

## 2016-10-09 LAB — CBC
HCT: 36 % (ref 36.0–46.0)
Hemoglobin: 12.1 g/dL (ref 12.0–15.0)
MCH: 29 pg (ref 26.0–34.0)
MCHC: 33.6 g/dL (ref 30.0–36.0)
MCV: 86.3 fL (ref 78.0–100.0)
PLATELETS: 136 10*3/uL — AB (ref 150–400)
RBC: 4.17 MIL/uL (ref 3.87–5.11)
RDW: 13.3 % (ref 11.5–15.5)
WBC: 6.6 10*3/uL (ref 4.0–10.5)

## 2016-10-09 LAB — COMPREHENSIVE METABOLIC PANEL
ALT: 89 U/L — AB (ref 14–54)
ANION GAP: 7 (ref 5–15)
AST: 65 U/L — ABNORMAL HIGH (ref 15–41)
Albumin: 3.9 g/dL (ref 3.5–5.0)
Alkaline Phosphatase: 123 U/L (ref 38–126)
BUN: 19 mg/dL (ref 6–20)
CHLORIDE: 102 mmol/L (ref 101–111)
CO2: 29 mmol/L (ref 22–32)
Calcium: 9.1 mg/dL (ref 8.9–10.3)
Creatinine, Ser: 1.27 mg/dL — ABNORMAL HIGH (ref 0.44–1.00)
GFR, EST AFRICAN AMERICAN: 54 mL/min — AB (ref >60)
GFR, EST NON AFRICAN AMERICAN: 46 mL/min — AB (ref >60)
Glucose, Bld: 107 mg/dL — ABNORMAL HIGH (ref 65–99)
POTASSIUM: 3.6 mmol/L (ref 3.5–5.1)
SODIUM: 138 mmol/L (ref 135–145)
Total Bilirubin: 0.5 mg/dL (ref 0.3–1.2)
Total Protein: 6.9 g/dL (ref 6.5–8.1)

## 2016-10-09 LAB — APTT: aPTT: 28 seconds (ref 24–36)

## 2016-10-09 LAB — I-STAT CHEM 8, ED
BUN: 19 mg/dL (ref 6–20)
CALCIUM ION: 1.16 mmol/L (ref 1.15–1.40)
CHLORIDE: 102 mmol/L (ref 101–111)
Creatinine, Ser: 1.3 mg/dL — ABNORMAL HIGH (ref 0.44–1.00)
Glucose, Bld: 104 mg/dL — ABNORMAL HIGH (ref 65–99)
HCT: 39 % (ref 36.0–46.0)
HEMOGLOBIN: 13.3 g/dL (ref 12.0–15.0)
POTASSIUM: 3.6 mmol/L (ref 3.5–5.1)
Sodium: 138 mmol/L (ref 135–145)
TCO2: 28 mmol/L (ref 0–100)

## 2016-10-09 LAB — ETHANOL

## 2016-10-09 LAB — CBG MONITORING, ED: GLUCOSE-CAPILLARY: 100 mg/dL — AB (ref 65–99)

## 2016-10-09 LAB — I-STAT TROPONIN, ED: TROPONIN I, POC: 0 ng/mL (ref 0.00–0.08)

## 2016-10-09 MED ORDER — LORAZEPAM 2 MG/ML IJ SOLN
0.5000 mg | Freq: Once | INTRAMUSCULAR | Status: AC
  Administered 2016-10-09: 0.5 mg via INTRAVENOUS
  Filled 2016-10-09: qty 1

## 2016-10-09 NOTE — ED Triage Notes (Signed)
Patient reports that she has had numbness of the right arm and right leg, memory loss x 2 days. Patient states she is unable to use right hand to write with. Patient was at the PCP and was told she could possibly be having stroke symptoms. PCP wanted to call an ambulance, but patient refused. Patient's friend brought patient to the ED

## 2016-10-09 NOTE — ED Provider Notes (Signed)
[facility] DEPT Provider Note   CSN: [id number] Arrival date & time: 10/09/16  1200     History   Chief Complaint Chief Complaint  Patient presents with  . Numbness    HPI Cassandra [name] is a 56 y.o. female.  56 yo F with a cc of R sided weakness and pain. Going on for past couple of days.  Seen at PCP office and sent for stroke rule out.  The patient is complaining of pain that goes from her back in her right leg as well as pain that goes down her right arm. Describes a tingling sensation as well. Denies headache. Denies neck pain.   The history is provided by the patient.  Illness  This is a new problem. The current episode started 2 days ago. The problem occurs constantly. The problem has not changed since onset.Pertinent negatives include no chest pain, no headaches and no shortness of breath. Nothing aggravates the symptoms. Nothing relieves the symptoms. She has tried nothing for the symptoms. The treatment provided no relief.    Past Medical History:  Diagnosis Date  . ANEMIA, IRON DEFICIENCY 09/16/2007   Qualifier: Diagnosis of  By: [name], [name]    . Anxiety   . ANXIETY 09/16/2007   Qualifier: History of  By: Harlon Ditty CMA (AAMA), [name]    . Autoimmune hepatitis ([place])   . Chronic renal insufficiency   . CIRRHOSIS 09/16/2007   Qualifier: Diagnosis of  By: [name], [name]    . Cirrhosis ([place])   . COLONIC POLYPS, ADENOMATOUS, HX OF 05/05/2008   Qualifier: Diagnosis of  By: [name], [name]    . Depression   . External hemorrhoids   . Fibromyalgia   . Generalized headaches   . GERD (gastroesophageal reflux disease)   . Hx of adenomatous colonic polyps   . Hyperlipidemia   . Internal hemorrhoids   . Iron deficiency anemia   . Liver replaced by transplant ([place]) 02/22/2009   Qualifier: Diagnosis of  By: Chester Holstein NP, Nevin Bloodgood    . 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 Osteoarthritis of right knee 03/01/2013  . Chronic pain 03/28/2012  . Impaired glucose tolerance 12/13/2011  . Preventative health care 12/06/2011  . Fibromyalgia   . Chronic renal insufficiency   . GERD (gastroesophageal reflux disease)   . Depression   . DIARRHEA-PRESUMED INFECTIOUS 02/22/2009  . MUSCLE CRAMPS 02/22/2009  . NAUSEA AND VOMITING 02/22/2009  . NONSPECIFIC ABNORM RESULTS KIDNEY FUNCTION STUDY 02/22/2009  . FEVER, HX OF 02/22/2009  . Liver replaced by transplant ([place]) 02/22/2009  . COLONIC POLYPS, ADENOMATOUS, HX OF 05/05/2008  . ANEMIA, IRON DEFICIENCY 09/16/2007  . ANXIETY 09/16/2007  . INTERNAL HEMORRHOIDS 09/16/2007  . HEMORRHOIDS, EXTERNAL 09/16/2007  . Autoimmune hepatitis ([place]) 09/16/2007  . CIRRHOSIS 09/16/2007    Past Surgical History:  Procedure Laterality Date  . ABDOMINAL HYSTERECTOMY    . KNEE ARTHROSCOPY    . LIVER TRANSPLANT  1992  . ROTATOR CUFF REPAIR      OB History    No data available       Home Medications    Prior to Admission medications   Medication Sig Start Date End Date Taking? Authorizing Provider  ALPRAZolam Duanne Moron) 1 MG tablet Take 1 mg by mouth at bedtime as needed. Anxiety   Yes [provider]  mycophenolate (CELLCEPT) 500 MG tablet Take 500 mg by mouth  2 (two) times daily.   Yes [provider]  omega-3 acid ethyl esters (LOVAZA) 1 G capsule Take 1 g by mouth 2 (two) times daily.  04/16/14  Yes [provider]  oxyCODONE-acetaminophen (PERCOCET) 10-325 MG per tablet Take 1 tablet by mouth 3 (three) times daily as needed. Pain   Yes [provider]  potassium chloride SA (K-DUR,KLOR-CON) 20 MEQ tablet TAKE 1 TABLET BY MOUTH TWICE DAILY 09/11/16  Yes Nandigam, Kavitha V, MD  PredniSONE (DELTASONE PO) Take 1 tablet by mouth. Prednisone just reduced from 5mg  to 4 mg   Yes [provider]  Triamcinolone Acetonide (TRIAMCINOLONE 0.1 % CREAM : EUCERIN) CREA Apply 1  application topically 2 (two) times daily.  07/07/14  Yes [provider]  cyclobenzaprine (FLEXERIL) 5 MG tablet Take 1 tablet (5 mg total) by mouth 3 (three) times daily as needed for muscle spasms. Patient not taking: Reported on 10/09/2016 08/01/15   Wardell Honour, MD  esomeprazole (NEXIUM) 40 MG capsule Take 1 capsule (40 mg total) by mouth daily. 03/01/11 02/29/12  L[place]ayette Dragon, MD  tacrolimus (PROGRAF) 1 MG capsule Take 1 capsule (1 mg total) by mouth 2 (two) times daily. Patient not taking: Reported on 08/01/2015 01/03/15   Lafayette Dragon, MD    Family History Family History  Problem Relation Age of Onset  . Diabetes Brother   . Heart disease Sister   . Hypertension Sister   . Diabetes Mother   . Dementia Mother   . Colon cancer Neg Hx     Social History Social History  Substance Use Topics  . Smoking status: Never Smoker  . Smokeless tobacco: Never Used  . Alcohol use No     Allergies   Erythromycin; Ezetimibe; Other; Rofecoxib; Statins; and Nsaids   Review of Systems Review of Systems  Constitutional: Negative for chills and fever.  HENT: Negative for congestion and rhinorrhea.   Eyes: Negative for redness and visual disturbance.  Respiratory: Negative for shortness of breath and wheezing.   Cardiovascular: Negative for chest pain and palpitations.  Gastrointestinal: Negative for nausea and vomiting.  Genitourinary: Negative for dysuria and urgency.  Musculoskeletal: Positive for arthralgias and myalgias.  Skin: Negative for pallor and wound.  Neurological: Negative for dizziness and headaches.     Physical Exam Updated Vital Signs BP (!) 144/84   Pulse 74   Temp 98.4 F (36.9 C) (Oral)   Resp 19   Ht 5\' 5"  (1.651 m)   Wt 195 lb (88.5 kg)   SpO2 100%   BMI 32.45 kg/m   Physical Exam  Constitutional: She is oriented to person, place, and time. She appears well-developed and well-nourished. No distress.  HENT:  Head: Normocephalic and  atraumatic.  Eyes: EOM are normal. Pupils are equal, round, and reactive to light.  Neck: Normal range of motion. Neck supple.  Cardiovascular: Normal rate and regular rhythm.  Exam reveals no gallop and no friction rub.   No murmur heard. Pulmonary/Chest: Effort normal. She has no wheezes. She has no rales.  Abdominal: Soft. She exhibits no distension and no mass. There is no tenderness. There is no guarding.  Musculoskeletal: She exhibits no edema or tenderness.  Neurological: She is alert and oriented to person, place, and time. No cranial nerve deficit or sensory deficit. GCS eye subscore is 4. GCS verbal subscore is 5. GCS motor subscore is 6.  R sided weakness4/5  compared to 5/5 L sided  Skin: Skin is warm  and dry. She is not diaphoretic.  Psychiatric: She has a normal mood and affect. Her behavior is normal.  Nursing note and vitals reviewed.    ED Treatments / Results  Labs (all labs ordered are listed, but only abnormal results are displayed) Labs Reviewed  CBC - Abnormal; Notable for the following:       Result Value   Platelets 136 (*)    All other components within normal limits  COMPREHENSIVE METABOLIC PANEL - Abnormal; Notable for the following:    Glucose, Bld 107 (*)    Creatinine, Ser 1.27 (*)    AST 65 (*)    ALT 89 (*)    GFR calc non Af Amer 46 (*)    GFR calc Af Amer 54 (*)    All other components within normal limits  RAPID URINE DRUG SCREEN, HOSP PERFORMED - Abnormal; Notable for the following:    Benzodiazepines POSITIVE (*)    All other components within normal limits  URINALYSIS, ROUTINE W REFLEX MICROSCOPIC - Abnormal; Notable for the following:    Color, Urine STRAW (*)    All other components within normal limits  I-STAT CHEM 8, ED - Abnormal; Notable for the following:    Creatinine, Ser 1.30 (*)    Glucose, Bld 104 (*)    All other components within normal limits  CBG MONITORING, ED - Abnormal; Notable for the following:    Glucose-Capillary  100 (*)    All other components within normal limits  ETHANOL  PROTIME-INR  APTT  DIFFERENTIAL  TACROLIMUS LEVEL  I-STAT TROPOININ, ED    EKG  EKG Interpretation  Date/Time:  Tuesday Oct 09 2016 12:33:40 EDT Ventricular Rate:  72 PR Interval:    QRS Duration: 82 QT Interval:  371 QTC Calculation: 406 R Axis:   2 Text Interpretation:  Sinus rhythm Paired ventricular premature complexes Consider anterior infarct No old tracing to compare Confirmed by Shawnique Mariotti MD, DANIEL [id number]) on 10/09/2016 2:29:29 PM       Radiology Ct Head Wo Contrast  Result Date: 10/09/2016 CLINICAL DATA:  Right arm and leg numbness.  Memory loss for 2 days. EXAM: CT HEAD WITHOUT CONTRAST TECHNIQUE: Contiguous axial images were obtained from the base of the skull through the vertex without intravenous contrast. COMPARISON:  None. FINDINGS: Brain: No evidence of acute infarction, hemorrhage, extra-axial collection, ventriculomegaly, or mass effect. Mild generalized cerebral atrophy. Mild periventricular white matter low attenuation likely secondary to microangiopathy. Vascular: No significant intracranial atherosclerotic disease. Skull: Negative for fracture or focal lesion. Incidental note made of incomplete fusion of the anterior and posterior arch of C1. Sinuses/Orbits: Visualized portions of the orbits are unremarkable. Visualized portions of the paranasal sinuses and mastoid air cells are unremarkable. Other: None. IMPRESSION: 1. No acute intracranial pathology. Electronically Signed   By: Kathreen Devoid   On: 10/09/2016 14:55   Mr Brain Wo Contrast  Result Date: 10/09/2016 CLINICAL DATA:  Right-sided weakness. Memory loss. Two days duration. EXAM: MRI HEAD WITHOUT CONTRAST TECHNIQUE: Multiplanar, multiecho pulse sequences of the brain and surrounding structures were obtained without intravenous contrast. COMPARISON:  CT same day FINDINGS: Brain: Diffusion imaging does not show any acute or subacute infarction. The  brainstem and cerebellum are normal. Cerebral hemispheres are normal except for a few punctate foci of T2 and FLAIR signal, often not of any clinical relevance. No cortical abnormality. No mass lesion, hemorrhage, hydrocephalus or extra-axial collection. Vascular: Major vessels at the base of the brain show flow. Skull and  upper cervical spine: Negative Sinuses/Orbits: Clear/normal Other: None IMPRESSION: Negative MRI. Normal for age. Few punctate foci of T2 and FLAIR signal in the white matter, usually subclinical. Electronically Signed   By: Nelson Chimes M.D.   On: 10/09/2016 20:02   Mr Cervical Spine Wo Contrast  Result Date: 10/09/2016 CLINICAL DATA:  Right paresthesias EXAM: MRI CERVICAL SPINE WITHOUT CONTRAST TECHNIQUE: Multiplanar, multisequence MR imaging of the cervical spine was performed. No intravenous contrast was administered. COMPARISON:  None. FINDINGS: Alignment: Normal Vertebrae: No acute compression fracture, discitis-osteomyelitis, facet edema or other focal marrow lesion. No epidural collection. Cord: Normal caliber and signal Posterior Fossa, vertebral arteries, paraspinal tissues: Visualized posterior fossa is normal. Vertebral artery flow voids are preserved. Normal visualized paraspinal soft tissues. Disc levels: C1-C2: Normal. C2-C3: Normal disc space and facets. No spinal canal or neuroforaminal stenosis. C3-C4: There is a central disc extrusion with inferior migration. There is mild narrowing of the spinal canal. No nerve root impingement. C4-C5: Small disc bulge without spinal canal stenosis or neural foraminal narrowing. C5-C6: There is a large disc protrusion, greatest in the right subarticular location. This there is moderate narrowing of the spinal canal. There is moderate to severe right and mild left neural foraminal stenosis. C6-C7: Medium-sized central disc protrusion with mild narrowing of the spinal canal. Severe narrowing of the left neural foramen. C7-T1: Small disc bulge.   Mild narrowing of the neural foramina. IMPRESSION: 1. The most likely symptomatic level is C5-C6 where a right subarticular predominant disc protrusion causes severe right neural foraminal narrowing. 2. Severe left neural foraminal narrowing at C6-C7. 3. Multilevel mild spinal canal stenosis without cord compression or signal change. Electronically Signed   By: Ulyses Jarred M.D.   On: 10/09/2016 20:47    Procedures Procedures (including critical care time)  Medications Ordered in ED Medications  LORazepam (ATIVAN) injection 0.5 mg (0.5 mg Intravenous Given 10/09/16 1905)     Initial Impression / Assessment and Plan / ED Course  I have reviewed the triage vital signs and the nursing notes.  Pertinent labs & imaging results that were available during my care of the patient were reviewed by me and considered in my medical decision making (see chart for details).     55 yo F with a chief complaint of right-sided paresthesias and weakness. Going on for a couple days. Saw her PCP who suggested she come to the ED for a stroke evaluation. Right-sided weakness on my exam.  Discussed with Neuro midlevel Needs MR brain and C and L spine.  If negative likely d/c home with outpatient paresthesia workup.   The patients results and plan were reviewed and discussed.   Any x-rays performed were independently reviewed by myself.   Differential diagnosis were considered with the presenting HPI.  Medications  LORazepam (ATIVAN) injection 0.5 mg (0.5 mg Intravenous Given 10/09/16 1905)    Vitals:   10/09/16 1236 10/09/16 1651 10/09/16 1652 10/09/16 2047  BP:  (!) 144/96  (!) 144/84  Pulse:    74  Resp:   19 19  Temp: 97.7 F (36.5 C)   98.4 F (36.9 C)  TempSrc:    Oral  SpO2:   97% 100%  Weight:      Height:        Final diagnoses:  Paresthesia     Final Clinical Impressions(s) / ED Diagnoses   Final diagnoses:  Paresthesia    New Prescriptions New Prescriptions   No medications  on file  Deno Etienne, DO 10/09/16 2138

## 2016-10-09 NOTE — ED Notes (Signed)
Pt remains out of room in MRI at this time 

## 2016-10-09 NOTE — ED Provider Notes (Signed)
I assumed care of this patient from Dr. Tyrone Nine at 1600.  Please see their note for further details of Hx, PE.  Briefly patient is a 56 y.o. female who presents with right arm paresthesias and weakness with left leg pain. Patient is currently pending MRI of the brain and spine to assess for any evidence of stroke or lesions.  MRI of the brain without evidence of CVA. MRI of the cervical spine revealed cervical nerve root stenosis. Unable to obtain MRI thoracic and lumbar spine. I evaluated the patient myself and the patient denied any cauda equina symptoms such as the weakness, numbness, bowel or bladder incontinence. Patient with 5 out of 5 strength in the lower extremities on my exam. Discussed case with the neurology who agreed that the MRI of the thoracic and lumbar spine were likely and necessary at this time. He recommended follow up with neurosurgery for the cervical nerve root stenosis.  Disposition: Discharge  Condition: Good  I have discussed the results, Dx and Tx plan with the patient who expressed understanding and agree(s) with the plan. Discharge instructions discussed at great length. The patient was given strict return precautions who verbalized understanding of the instructions. No further questions at time of discharge.    New Prescriptions   No medications on file    Follow Up: Consuella Lose, MD [address] [place] [address] [phone]  Schedule an appointment as soon as possible for a visit  For close follow up to assess for cervical nerve compression  Leamon Arnt, MD [address] [place] [address] [phone]  Schedule an appointment as soon as possible for a visit          Leonette Monarch Grayce Sessions, MD 10/09/16 2309

## 2016-10-09 NOTE — ED Notes (Signed)
Pt brought back from MRI, pt was only able to complete brain and c spine MRI d/t feeling claustrophobic per MRI tech

## 2016-10-09 NOTE — ED Notes (Signed)
Pt has been at MRI and just return at 8:38 pm pt wasn't in the room at 7pm so vitals wasn't retain

## 2016-10-10 LAB — TACROLIMUS LEVEL: Tacrolimus (FK506) - LabCorp: 3.3 ng/mL (ref 2.0–20.0)

## 2016-10-16 ENCOUNTER — Other Ambulatory Visit: Payer: Self-pay | Admitting: Gastroenterology

## 2016-11-14 ENCOUNTER — Other Ambulatory Visit: Payer: Self-pay | Admitting: Gastroenterology

## 2016-11-14 NOTE — Telephone Encounter (Signed)
Patient is wanting a refill of KDUR  It looks like she has ot been seen since 2016  Can we refill or should she be seen ?

## 2016-12-16 ENCOUNTER — Other Ambulatory Visit: Payer: Self-pay | Admitting: Gastroenterology

## 2016-12-26 ENCOUNTER — Other Ambulatory Visit: Payer: Self-pay | Admitting: Gastroenterology

## 2016-12-26 ENCOUNTER — Other Ambulatory Visit (INDEPENDENT_AMBULATORY_CARE_PROVIDER_SITE_OTHER): Payer: Medicare Other

## 2016-12-26 DIAGNOSIS — Z944 Liver transplant status: Secondary | ICD-10-CM | POA: Diagnosis not present

## 2016-12-26 LAB — CBC WITH DIFFERENTIAL/PLATELET
BASOS ABS: 0.1 10*3/uL (ref 0.0–0.1)
BASOS PCT: 0.8 % (ref 0.0–3.0)
EOS PCT: 5.1 % — AB (ref 0.0–5.0)
Eosinophils Absolute: 0.3 10*3/uL (ref 0.0–0.7)
HEMATOCRIT: 41.8 % (ref 36.0–46.0)
Hemoglobin: 13.8 g/dL (ref 12.0–15.0)
LYMPHS ABS: 2.7 10*3/uL (ref 0.7–4.0)
LYMPHS PCT: 42.2 % (ref 12.0–46.0)
MCHC: 33 g/dL (ref 30.0–36.0)
MCV: 88.3 fl (ref 78.0–100.0)
MONOS PCT: 10.1 % (ref 3.0–12.0)
Monocytes Absolute: 0.7 10*3/uL (ref 0.1–1.0)
NEUTROS ABS: 2.7 10*3/uL (ref 1.4–7.7)
NEUTROS PCT: 41.8 % — AB (ref 43.0–77.0)
PLATELETS: 197 10*3/uL (ref 150.0–400.0)
RBC: 4.74 Mil/uL (ref 3.87–5.11)
RDW: 13.2 % (ref 11.5–15.5)
WBC: 6.5 10*3/uL (ref 4.0–10.5)

## 2016-12-26 LAB — COMPREHENSIVE METABOLIC PANEL
ALT: 10 U/L (ref 0–35)
AST: 12 U/L (ref 0–37)
Albumin: 3.9 g/dL (ref 3.5–5.2)
Alkaline Phosphatase: 72 U/L (ref 39–117)
BILIRUBIN TOTAL: 0.5 mg/dL (ref 0.2–1.2)
BUN: 20 mg/dL (ref 6–23)
CALCIUM: 9.2 mg/dL (ref 8.4–10.5)
CHLORIDE: 106 meq/L (ref 96–112)
CO2: 26 meq/L (ref 19–32)
Creatinine, Ser: 1.3 mg/dL — ABNORMAL HIGH (ref 0.40–1.20)
GFR: 44.94 mL/min — AB (ref >60.00)
GLUCOSE: 95 mg/dL (ref 70–99)
POTASSIUM: 4.1 meq/L (ref 3.5–5.1)
Sodium: 139 mEq/L (ref 135–145)
Total Protein: 6.9 g/dL (ref 6.0–8.3)

## 2016-12-27 LAB — TACROLIMUS LEVEL: TACROLIMUS LVL: 6.4 ng/mL (ref 5.0–20.0)

## 2017-01-12 ENCOUNTER — Other Ambulatory Visit: Payer: Self-pay | Admitting: Gastroenterology

## 2017-01-25 DIAGNOSIS — M4802 Spinal stenosis, cervical region: Secondary | ICD-10-CM | POA: Insufficient documentation

## 2017-02-11 ENCOUNTER — Other Ambulatory Visit: Payer: Self-pay | Admitting: Gastroenterology

## 2017-03-15 ENCOUNTER — Other Ambulatory Visit (INDEPENDENT_AMBULATORY_CARE_PROVIDER_SITE_OTHER): Payer: Medicare Other

## 2017-03-15 DIAGNOSIS — Z944 Liver transplant status: Secondary | ICD-10-CM

## 2017-03-15 LAB — CBC WITH DIFFERENTIAL/PLATELET
BASOS ABS: 0 10*3/uL (ref 0.0–0.1)
BASOS PCT: 0.4 % (ref 0.0–3.0)
EOS ABS: 0.3 10*3/uL (ref 0.0–0.7)
Eosinophils Relative: 4 % (ref 0.0–5.0)
HEMATOCRIT: 40.9 % (ref 36.0–46.0)
HEMOGLOBIN: 13.5 g/dL (ref 12.0–15.0)
Lymphocytes Relative: 39.5 % (ref 12.0–46.0)
Lymphs Abs: 2.7 10*3/uL (ref 0.7–4.0)
MCHC: 32.9 g/dL (ref 30.0–36.0)
MCV: 89.3 fl (ref 78.0–100.0)
MONO ABS: 0.6 10*3/uL (ref 0.1–1.0)
Monocytes Relative: 9.2 % (ref 3.0–12.0)
NEUTROS ABS: 3.2 10*3/uL (ref 1.4–7.7)
NEUTROS PCT: 46.9 % (ref 43.0–77.0)
PLATELETS: 158 10*3/uL (ref 150.0–400.0)
RBC: 4.58 Mil/uL (ref 3.87–5.11)
RDW: 13.3 % (ref 11.5–15.5)
WBC: 6.9 10*3/uL (ref 4.0–10.5)

## 2017-03-15 LAB — COMPREHENSIVE METABOLIC PANEL
ALBUMIN: 4 g/dL (ref 3.5–5.2)
ALT: 76 U/L — AB (ref 0–35)
AST: 84 U/L — AB (ref 0–37)
Alkaline Phosphatase: 108 U/L (ref 39–117)
BILIRUBIN TOTAL: 0.4 mg/dL (ref 0.2–1.2)
BUN: 20 mg/dL (ref 6–23)
CALCIUM: 8.8 mg/dL (ref 8.4–10.5)
CHLORIDE: 105 meq/L (ref 96–112)
CO2: 26 mEq/L (ref 19–32)
CREATININE: 1.32 mg/dL — AB (ref 0.40–1.20)
GFR: 44.12 mL/min — ABNORMAL LOW (ref >60.00)
Glucose, Bld: 109 mg/dL — ABNORMAL HIGH (ref 70–99)
Potassium: 4 mEq/L (ref 3.5–5.1)
SODIUM: 140 meq/L (ref 135–145)
Total Protein: 6.7 g/dL (ref 6.0–8.3)

## 2017-03-18 LAB — ILLEGIBLE FAX NUMB

## 2017-03-18 LAB — TACROLIMUS,HIGHLY SENSITIVE,LC/MS/MS: TACROLIMUS LVL: 3.4 ug/L — AB

## 2017-03-24 ENCOUNTER — Other Ambulatory Visit: Payer: Self-pay | Admitting: Gastroenterology

## 2017-03-25 NOTE — Telephone Encounter (Signed)
Dr Silverio Decamp this patient had her labs as requested in October, Can she have this refill of Oppelo   Thanks Has not been seen since 2016

## 2017-04-01 ENCOUNTER — Other Ambulatory Visit (INDEPENDENT_AMBULATORY_CARE_PROVIDER_SITE_OTHER): Payer: Medicare Other

## 2017-04-01 DIAGNOSIS — Z944 Liver transplant status: Secondary | ICD-10-CM | POA: Diagnosis not present

## 2017-04-01 LAB — COMPREHENSIVE METABOLIC PANEL
ALT: 19 U/L (ref 0–35)
AST: 18 U/L (ref 0–37)
Albumin: 3.9 g/dL (ref 3.5–5.2)
Alkaline Phosphatase: 95 U/L (ref 39–117)
BUN: 21 mg/dL (ref 6–23)
CHLORIDE: 108 meq/L (ref 96–112)
CO2: 25 mEq/L (ref 19–32)
Calcium: 9.2 mg/dL (ref 8.4–10.5)
Creatinine, Ser: 1.25 mg/dL — ABNORMAL HIGH (ref 0.40–1.20)
GFR: 46.97 mL/min — AB (ref >60.00)
GLUCOSE: 101 mg/dL — AB (ref 70–99)
POTASSIUM: 4 meq/L (ref 3.5–5.1)
SODIUM: 141 meq/L (ref 135–145)
Total Bilirubin: 0.4 mg/dL (ref 0.2–1.2)
Total Protein: 6.5 g/dL (ref 6.0–8.3)

## 2017-04-01 LAB — CBC WITH DIFFERENTIAL/PLATELET
Basophils Absolute: 0.1 10*3/uL (ref 0.0–0.1)
Basophils Relative: 1.3 % (ref 0.0–3.0)
EOS PCT: 5.3 % — AB (ref 0.0–5.0)
Eosinophils Absolute: 0.3 10*3/uL (ref 0.0–0.7)
HCT: 40.9 % (ref 36.0–46.0)
HEMOGLOBIN: 13.3 g/dL (ref 12.0–15.0)
LYMPHS ABS: 2 10*3/uL (ref 0.7–4.0)
Lymphocytes Relative: 36.6 % (ref 12.0–46.0)
MCHC: 32.5 g/dL (ref 30.0–36.0)
MCV: 89.8 fl (ref 78.0–100.0)
MONO ABS: 0.6 10*3/uL (ref 0.1–1.0)
MONOS PCT: 11.6 % (ref 3.0–12.0)
NEUTROS PCT: 45.2 % (ref 43.0–77.0)
Neutro Abs: 2.5 10*3/uL (ref 1.4–7.7)
Platelets: 184 10*3/uL (ref 150.0–400.0)
RBC: 4.55 Mil/uL (ref 3.87–5.11)
RDW: 13.5 % (ref 11.5–15.5)
WBC: 5.6 10*3/uL (ref 4.0–10.5)

## 2017-04-02 LAB — TACROLIMUS LEVEL: TACROLIMUS LVL: 4.3 ng/mL — AB

## 2017-04-02 LAB — ILLEGIBLE FAX NUMB

## 2017-04-09 LAB — HM MAMMOGRAPHY

## 2017-05-02 ENCOUNTER — Other Ambulatory Visit: Payer: Self-pay | Admitting: Gastroenterology

## 2017-06-05 ENCOUNTER — Other Ambulatory Visit: Payer: Self-pay | Admitting: Gastroenterology

## 2017-07-06 ENCOUNTER — Other Ambulatory Visit: Payer: Self-pay | Admitting: Gastroenterology

## 2017-07-17 ENCOUNTER — Other Ambulatory Visit: Payer: Self-pay

## 2017-07-17 ENCOUNTER — Other Ambulatory Visit (INDEPENDENT_AMBULATORY_CARE_PROVIDER_SITE_OTHER): Payer: Medicare Other

## 2017-07-17 DIAGNOSIS — K754 Autoimmune hepatitis: Secondary | ICD-10-CM

## 2017-07-17 DIAGNOSIS — Z298 Encounter for other specified prophylactic measures: Secondary | ICD-10-CM

## 2017-07-17 DIAGNOSIS — B259 Cytomegaloviral disease, unspecified: Secondary | ICD-10-CM

## 2017-07-17 DIAGNOSIS — Z944 Liver transplant status: Secondary | ICD-10-CM

## 2017-07-17 LAB — CBC WITH DIFFERENTIAL/PLATELET
BASOS PCT: 0.6 % (ref 0.0–3.0)
Basophils Absolute: 0 10*3/uL (ref 0.0–0.1)
EOS ABS: 0.2 10*3/uL (ref 0.0–0.7)
Eosinophils Relative: 4.1 % (ref 0.0–5.0)
HEMATOCRIT: 41.2 % (ref 36.0–46.0)
Hemoglobin: 13.9 g/dL (ref 12.0–15.0)
Lymphocytes Relative: 31.8 % (ref 12.0–46.0)
Lymphs Abs: 1.7 10*3/uL (ref 0.7–4.0)
MCHC: 33.7 g/dL (ref 30.0–36.0)
MCV: 87.2 fl (ref 78.0–100.0)
Monocytes Absolute: 0.6 10*3/uL (ref 0.1–1.0)
Monocytes Relative: 10.8 % (ref 3.0–12.0)
NEUTROS ABS: 2.9 10*3/uL (ref 1.4–7.7)
Neutrophils Relative %: 52.7 % (ref 43.0–77.0)
PLATELETS: 165 10*3/uL (ref 150.0–400.0)
RBC: 4.73 Mil/uL (ref 3.87–5.11)
RDW: 13.3 % (ref 11.5–15.5)
WBC: 5.4 10*3/uL (ref 4.0–10.5)

## 2017-07-17 LAB — COMPREHENSIVE METABOLIC PANEL
ALT: 17 U/L (ref 0–35)
AST: 16 U/L (ref 0–37)
Albumin: 4.1 g/dL (ref 3.5–5.2)
Alkaline Phosphatase: 84 U/L (ref 39–117)
BUN: 22 mg/dL (ref 6–23)
CO2: 24 meq/L (ref 19–32)
CREATININE: 1.32 mg/dL — AB (ref 0.40–1.20)
Calcium: 9.2 mg/dL (ref 8.4–10.5)
Chloride: 107 mEq/L (ref 96–112)
GFR: 44.06 mL/min — ABNORMAL LOW (ref >60.00)
Glucose, Bld: 107 mg/dL — ABNORMAL HIGH (ref 70–99)
Potassium: 4.7 mEq/L (ref 3.5–5.1)
SODIUM: 138 meq/L (ref 135–145)
Total Bilirubin: 0.3 mg/dL (ref 0.2–1.2)
Total Protein: 7.2 g/dL (ref 6.0–8.3)

## 2017-07-17 NOTE — Addendum Note (Signed)
Addended by: Virgina Evener A on: 07/17/2017 09:49 AM   Modules accepted: Orders

## 2017-07-18 LAB — TACROLIMUS LEVEL: Tacrolimus (FK506), Blood: 3.9 ng/mL — ABNORMAL LOW

## 2017-08-04 ENCOUNTER — Other Ambulatory Visit: Payer: Self-pay | Admitting: Gastroenterology

## 2017-08-13 ENCOUNTER — Other Ambulatory Visit: Payer: Self-pay | Admitting: Gastroenterology

## 2017-08-13 NOTE — Telephone Encounter (Signed)
Can we send potassium for the patient? Requesting refills

## 2017-08-14 ENCOUNTER — Telehealth: Payer: Self-pay | Admitting: Gastroenterology

## 2017-08-14 MED ORDER — POTASSIUM CHLORIDE CRYS ER 20 MEQ PO TBCR: 20.0000 meq | EXTENDED_RELEASE_TABLET | Freq: Two times a day (BID) | ORAL | 1 refills | Status: DC

## 2017-08-14 NOTE — Telephone Encounter (Signed)
Dr Nandigam is this ok to refill for patient  

## 2017-08-14 NOTE — Telephone Encounter (Signed)
Ok to refill 

## 2017-08-14 NOTE — Telephone Encounter (Signed)
Medication sent to requested pharmacy.

## 2017-09-28 DIAGNOSIS — D126 Benign neoplasm of colon, unspecified: Secondary | ICD-10-CM | POA: Insufficient documentation

## 2017-09-30 ENCOUNTER — Telehealth: Payer: Self-pay | Admitting: Gastroenterology

## 2017-09-30 ENCOUNTER — Other Ambulatory Visit: Payer: Self-pay | Admitting: *Deleted

## 2017-09-30 DIAGNOSIS — D849 Immunodeficiency, unspecified: Secondary | ICD-10-CM

## 2017-09-30 DIAGNOSIS — D899 Disorder involving the immune mechanism, unspecified: Secondary | ICD-10-CM

## 2017-09-30 DIAGNOSIS — Z944 Liver transplant status: Secondary | ICD-10-CM

## 2017-09-30 NOTE — Telephone Encounter (Signed)
Lab orders received and entered in the computer for pt

## 2017-09-30 NOTE — Telephone Encounter (Signed)
Ileana from [facility] faxing over orders for pt to have labs done. FYI

## 2017-10-04 ENCOUNTER — Encounter: Payer: Self-pay | Admitting: Family Medicine

## 2017-10-07 ENCOUNTER — Other Ambulatory Visit: Payer: Self-pay

## 2017-10-07 ENCOUNTER — Encounter: Payer: Self-pay | Admitting: Family Medicine

## 2017-10-07 ENCOUNTER — Ambulatory Visit (INDEPENDENT_AMBULATORY_CARE_PROVIDER_SITE_OTHER): Payer: Medicare Other | Admitting: Family Medicine

## 2017-10-07 VITALS — BP 112/80 | HR 90 | Temp 98.2°F | Resp 16 | Ht 64.0 in | Wt 193.4 lb

## 2017-10-07 DIAGNOSIS — M797 Fibromyalgia: Secondary | ICD-10-CM | POA: Diagnosis not present

## 2017-10-07 DIAGNOSIS — M170 Bilateral primary osteoarthritis of knee: Secondary | ICD-10-CM | POA: Diagnosis not present

## 2017-10-07 DIAGNOSIS — D849 Immunodeficiency, unspecified: Secondary | ICD-10-CM

## 2017-10-07 DIAGNOSIS — M25461 Effusion, right knee: Secondary | ICD-10-CM

## 2017-10-07 DIAGNOSIS — F411 Generalized anxiety disorder: Secondary | ICD-10-CM

## 2017-10-07 DIAGNOSIS — D899 Disorder involving the immune mechanism, unspecified: Secondary | ICD-10-CM

## 2017-10-07 MED ORDER — GABAPENTIN 300 MG PO CAPS: 300.0000 mg | ORAL_CAPSULE | Freq: Every day | ORAL | 1 refills | Status: DC

## 2017-10-07 MED ORDER — DULOXETINE HCL 20 MG PO CPEP: 20.0000 mg | ORAL_CAPSULE | Freq: Every day | ORAL | 2 refills | Status: DC

## 2017-10-07 NOTE — Progress Notes (Signed)
Subjective  CC:  Chief Complaint  Patient presents with  . Establish Care    Establish Care  . Medication Refill    Needs to update  . Knee Pain    Right knee pain for about 3 months    HPI: Cassandra [name] is a 57 y.o. female is a former [facility] patient and is here to reestablish care with me today.    She has the following concerns or needs:  Knee pain/effusion. I reviewed ortho notes from [facility] and [facility]. Painful persistent effusion; last drained > 1 year ago. She had synvisc placed about 5 weeks ago w/o benefit. Tight. Understands risk of infection. 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 She has done fairly well; is on chronic pain meds for back pain. Doesn't want replacement at this time.   Fibromyalgia and chronic back pain on oxycodone.    We updated and reviewed the patient's past history in detail and it is documented below.  Patient Active Problem List   Diagnosis Date Noted  . Immunosuppressed status ([place]) 10/07/2017    Priority: High  . Adenomatous polyp of colon 09/28/2017    Priority: High    Per [facility] found in 2009; followed up in 2011.   Marland Kitchen Chronic narcotic dependence ([place]) 11/25/2013    Priority: High  . Mixed hyperlipidemia 03/10/2013    Priority: High  . Osteoarthritis, knee 03/01/2013    Priority: High    Dr. Michaelle Birks saw in 08/2016 for pain and was considering Synvisc.  High risk for total knee due to immune suppressant post liver transplant. [facility] ortho 2019: severe OA right knee with recurrent effusions. S/p synvisc w/o relief. Holding off on replacement.    . Chronic back pain 10/13/2012    Priority: High  . Impaired glucose tolerance 12/13/2011    Priority: High  . History of liver transplant ([place]) 02/22/2009    Priority: High    Qualifier: Diagnosis of  By: Chester Holstein NP, [place].  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 Overview:  01/28/1991   . Cervical stenosis of spine 01/25/2017    Priority: Medium  .  Fibromyalgia 08/24/2013    Priority: Medium  . Hx of tuberculosis 10/13/2012    Priority: Medium  . Anxiety disorder 09/16/2007    Priority: Medium    Qualifier: History of  By: [name], [name]    Overview:  Has failed prozac, elavil, zoloft in past.  Uses nightly xanax since 2000   . Preventative health care 12/06/2011   Health Maintenance  Topic Date Due  . INFLUENZA VACCINE  01/02/2018  . MAMMOGRAM  04/09/2018  . COLONOSCOPY  10/13/2020  . TETANUS/TDAP  02/02/2021  . Hepatitis C Screening  Completed  . HIV Screening  Completed   Immunization History  Administered Date(s) Administered  . Hepatitis A, Adult 09/27/2016, 09/26/2017  . Influenza Split 03/05/2012  . Influenza, Quadrivalent, Recombinant, Inj, Pf 04/29/2013  . Influenza, Seasonal, Injecte, Preservative Fre 03/02/2014, 02/28/2015  . Influenza,inj,Quad PF,6+ Mos 03/28/2016  . Influenza-Unspecified 05/26/2013, 03/28/2016  . Pneumococcal Conjugate-13 09/22/2015  . Pneumococcal Polysaccharide-23 09/27/2016  . Tdap 05/17/2008, 12/03/2010   Current Meds  Medication Sig  . ALPRAZolam (XANAX) 0.5 MG tablet Take by mouth.  . mycophenolate (CELLCEPT) 500 MG tablet Take 500 mg by mouth 2 (two) times daily.  Marland Kitchen omega-3 acid ethyl esters (LOVAZA) 1 G capsule Take 1 g by mouth 2 (two) times daily.   Marland Kitchen  oxyCODONE-acetaminophen (PERCOCET) 10-325 MG per tablet Take 1 tablet by mouth 3 (three) times daily as needed. Pain  . potassium chloride SA (K-DUR,KLOR-CON) 20 MEQ tablet Take 1 tablet (20 mEq total) by mouth 2 (two) times daily.  . PredniSONE (DELTASONE PO) Take 1 tablet by mouth. Prednisone just reduced from 5mg  to 4 mg  . tacrolimus (PROGRAF) 1 MG capsule Take 1 capsule (1 mg total) by mouth 2 (two) times daily.    Allergies: Patient is allergic to erythromycin; ezetimibe; other; rofecoxib; statins; zonisamide; and nsaids. Past Medical History Patient  has a past medical history of Anxiety, Autoimmune  hepatitis ([place]), Chronic renal insufficiency, Cirrhosis ([place]), COLONIC POLYPS, ADENOMATOUS, HX OF (05/05/2008), Depression, External hemorrhoids, Fibromyalgia, GERD (gastroesophageal reflux disease), Hyperlipidemia, Internal hemorrhoids, Iron deficiency anemia, Liver replaced by transplant ([place]) (02/22/2009), and 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. Past Surgical History Patient  has a past surgical history that includes Liver transplant (1992); Knee arthroscopy; Rotator cuff repair; and Abdominal hysterectomy. Family History: Patient family history includes Alzheimer's disease in her mother; Dementia in her mother; Diabetes in her brother and mother; Gout in her mother; Heart disease in her sister; Hypertension in her mother and sister. Social History:  Patient  reports that she has never smoked. She has never used smokeless tobacco. She reports that she does not drink alcohol or use drugs.  Review of Systems: Constitutional: negative for fever or malaise Ophthalmic: negative for photophobia, double vision or loss of vision Cardiovascular: negative for chest pain, dyspnea on exertion, or new LE swelling Respiratory: negative for SOB or persistent cough Gastrointestinal: negative for abdominal pain, change in bowel habits or melena Genitourinary: negative for dysuria or gross hematuria Musculoskeletal: negative for new gait disturbance or muscular weakness Integumentary: negative for new or persistent rashes Neurological: negative for TIA or stroke symptoms Psychiatric: negative for SI or delusions Allergic/Immunologic: negative for hives  Patient Care Team    Relationship Specialty Notifications Start End  Leamon Arnt, MD PCP - General Family Medicine  10/07/17     Objective  Vitals: BP 112/80   Pulse 90   Temp 98.2 F (36.8 C) (Oral)   Resp 16   Ht 5\' 4"  (1.626 m)   Wt 193 lb 6.4 oz (87.7 kg)   SpO2 98%   BMI 33.20 kg/m  General:  Well developed, well nourished, no acute  distress  Psych:  Alert and oriented,normal mood and affect HEENT:  Normocephalic, atraumatic, non-icteric sclera, PERRL, oropharynx is without mass or exudate, supple neck without adenopathy, mass or thyromegaly Cardiovascular:  RRR without gallop, rub or murmur, nondisplaced PMI Respiratory:  Good breath sounds bilaterally, CTAB with normal respiratory effort MSK: no deformities, contusions.right knee with effusion and limited extension due to effusion Skin:  Warm, no rashes or suspicious lesions noted Knee Arthrocentesis with Injection Procedure Note  Pre-operative Diagnosis: right OA with effusion  Post-operative Diagnosis: same  Indications: Symptomatic relief of large effusion  Anesthesia: cold spray  Procedure Details   Verbal consent was obtained for the procedure. Universal time out taken.  The Knee joint was prepped with alcohol and an 18 gauge needle was inserted into the joint from the lateral approach. 45 ml of serosanguinous fluid was removed from the joint and discarded. The needle was removed and the area cleansed and dressed.  Complications:  None; patient tolerated the procedure well.   Assessment  1. Primary osteoarthritis of both knees   2. Effusion of right knee joint   3. Generalized anxiety disorder  4. Fibromyalgia   5. Immunosuppressed status ([place])      Plan   S/p aspiration. Ice and pain meds as needed. Monitor for infection  GAD and fibro: start cymbalta and gabapentin at night. Recheck in 4 weeks to see if helping.   Chronic narcotics per specialist.   Follow up:  Return in about 1 month (around 11/04/2017) for recheck.  Commons side effects, risks, benefits, and alternatives for medications and treatment plan prescribed today were discussed, and the patient expressed understanding of the given instructions. Patient is instructed to call or message via MyChart if he/she has any questions or concerns regarding our treatment plan. No barriers to  understanding were identified. We discussed Red Flag symptoms and signs in detail. Patient expressed understanding regarding what to do in case of urgent or emergency type symptoms.   Medication list was reconciled, printed and provided to the patient in AVS. Patient instructions and summary information was reviewed with the patient as documented in the AVS. This note was prepared with assistance of Dragon voice recognition software. Occasional wrong-word or sound-a-like substitutions may have occurred due to the inherent limitations of voice recognition software  Orders Placed This Encounter  Procedures  . HM MAMMOGRAPHY  . HM HEPATITIS C SCREENING LAB   Meds ordered this encounter  Medications  . gabapentin (NEURONTIN) 300 MG capsule    Sig: Take 1 capsule (300 mg total) by mouth at bedtime.    Dispense:  90 capsule    Refill:  1  . DULoxetine (CYMBALTA) 20 MG capsule    Sig: Take 1 capsule (20 mg total) by mouth daily.    Dispense:  30 capsule    Refill:  2

## 2017-10-07 NOTE — Patient Instructions (Addendum)
It was so good seeing you again! Thank you for establishing with my new practice and allowing me to continue caring for you. It means a lot to me.   Please schedule a follow up appointment with me in 4-6 weeks to recheck mood and knee pain.    Please start new medications as prescribed.

## 2017-10-15 ENCOUNTER — Other Ambulatory Visit: Payer: Self-pay | Admitting: Family Medicine

## 2017-10-15 MED ORDER — ALPRAZOLAM 0.5 MG PO TABS: 0.5000 mg | ORAL_TABLET | Freq: Every day | ORAL | 5 refills | Status: DC

## 2017-10-15 NOTE — Telephone Encounter (Signed)
Received and reviewed medication refill request.  Request is appropriate and was approved.  Please see medication orders for details. Uses xanax nightly since 2000

## 2017-10-15 NOTE — Telephone Encounter (Signed)
Alprazolam 0.5 mg tab LR 09/16/17  Start on 10/07/17 Historical provider  Provider Dr. Jonni Sanger LOV  10/07/17 NOV  11/04/17  [facility] [place]

## 2017-10-15 NOTE — Telephone Encounter (Signed)
Copied from [place] [id number]. Topic: Quick Communication - See Telephone Encounter >> Oct 15, 2017 10:11 AM Conception Chancy, NT wrote: CRM for notification. See Telephone encounter for: 10/15/17.  Patient is requesting a refill on ALPRAZolam (XANAX) 0.5 MG tablet. Please advise.  [facility] [phone] [address] [place] [address] Phone: [phone] Fax: [phone]

## 2017-10-16 NOTE — Telephone Encounter (Signed)
CRM created.  Okay for PEC to discuss/schedule patient for medication discussion

## 2017-10-16 NOTE — Telephone Encounter (Signed)
LM to RC  

## 2017-10-16 NOTE — Telephone Encounter (Signed)
Pt is wanting to know if she can have her xanax dose upped to 1 mg due to her not being able to take the Cymbalta and Neurontin

## 2017-10-16 NOTE — Telephone Encounter (Signed)
Pt returned phone call, please contact pt when possible

## 2017-10-16 NOTE — Telephone Encounter (Signed)
Why can't she take the cymbalta and neurontin. We just started those medications. OV if needs to discuss.  Would keep xanax at 0.5mg ; no increase recommended.

## 2017-10-17 ENCOUNTER — Ambulatory Visit (INDEPENDENT_AMBULATORY_CARE_PROVIDER_SITE_OTHER): Payer: Medicare Other | Admitting: Family Medicine

## 2017-10-17 ENCOUNTER — Other Ambulatory Visit (INDEPENDENT_AMBULATORY_CARE_PROVIDER_SITE_OTHER): Payer: Medicare Other

## 2017-10-17 ENCOUNTER — Encounter: Payer: Self-pay | Admitting: Family Medicine

## 2017-10-17 ENCOUNTER — Other Ambulatory Visit: Payer: Self-pay

## 2017-10-17 VITALS — BP 124/80 | HR 84 | Temp 98.6°F

## 2017-10-17 DIAGNOSIS — Z944 Liver transplant status: Secondary | ICD-10-CM

## 2017-10-17 DIAGNOSIS — D899 Disorder involving the immune mechanism, unspecified: Secondary | ICD-10-CM | POA: Diagnosis not present

## 2017-10-17 DIAGNOSIS — Z79899 Other long term (current) drug therapy: Secondary | ICD-10-CM | POA: Diagnosis not present

## 2017-10-17 DIAGNOSIS — F5101 Primary insomnia: Secondary | ICD-10-CM | POA: Diagnosis not present

## 2017-10-17 DIAGNOSIS — M797 Fibromyalgia: Secondary | ICD-10-CM

## 2017-10-17 DIAGNOSIS — D849 Immunodeficiency, unspecified: Secondary | ICD-10-CM

## 2017-10-17 LAB — COMPREHENSIVE METABOLIC PANEL
ALT: 30 U/L (ref 0–35)
AST: 27 U/L (ref 0–37)
Albumin: 3.8 g/dL (ref 3.5–5.2)
Alkaline Phosphatase: 90 U/L (ref 39–117)
BUN: 23 mg/dL (ref 6–23)
CO2: 27 mEq/L (ref 19–32)
Calcium: 8.8 mg/dL (ref 8.4–10.5)
Chloride: 107 mEq/L (ref 96–112)
Creatinine, Ser: 1.21 mg/dL — ABNORMAL HIGH (ref 0.40–1.20)
GFR: 58.9 mL/min — AB (ref >60.00)
Glucose, Bld: 97 mg/dL (ref 70–99)
Potassium: 3.6 mEq/L (ref 3.5–5.1)
Sodium: 142 mEq/L (ref 135–145)
Total Bilirubin: 0.3 mg/dL (ref 0.2–1.2)
Total Protein: 6.5 g/dL (ref 6.0–8.3)

## 2017-10-17 LAB — CBC WITH DIFFERENTIAL/PLATELET
Basophils Absolute: 0 10*3/uL (ref 0.0–0.1)
Basophils Relative: 0.3 % (ref 0.0–3.0)
Eosinophils Absolute: 0.3 10*3/uL (ref 0.0–0.7)
Eosinophils Relative: 4.5 % (ref 0.0–5.0)
HCT: 37.2 % (ref 36.0–46.0)
Hemoglobin: 12.4 g/dL (ref 12.0–15.0)
LYMPHS ABS: 2.6 10*3/uL (ref 0.7–4.0)
Lymphocytes Relative: 40.8 % (ref 12.0–46.0)
MCHC: 33.4 g/dL (ref 30.0–36.0)
MCV: 87.8 fl (ref 78.0–100.0)
MONO ABS: 0.6 10*3/uL (ref 0.1–1.0)
MONOS PCT: 9.4 % (ref 3.0–12.0)
Neutro Abs: 2.9 10*3/uL (ref 1.4–7.7)
Neutrophils Relative %: 45 % (ref 43.0–77.0)
PLATELETS: 163 10*3/uL (ref 150.0–400.0)
RBC: 4.24 Mil/uL (ref 3.87–5.11)
RDW: 13.5 % (ref 11.5–15.5)
WBC: 6.5 10*3/uL (ref 4.0–10.5)

## 2017-10-17 MED ORDER — ALPRAZOLAM 1 MG PO TABS: 1.0000 mg | ORAL_TABLET | Freq: Every day | ORAL | 5 refills | Status: DC

## 2017-10-17 MED ORDER — GABAPENTIN 300 MG PO CAPS: 300.0000 mg | ORAL_CAPSULE | Freq: Every day | ORAL | 1 refills | Status: DC

## 2017-10-17 NOTE — Telephone Encounter (Signed)
Patient had appointment with Dr. Jonni Sanger 10/17/2017

## 2017-10-17 NOTE — Patient Instructions (Signed)
It was so good seeing you again! Thank you for allowing me to continue caring for you. It means a lot to me.   Please schedule a follow up appointment with me in 4 weeks to recheck sleep and pain.

## 2017-10-17 NOTE — Progress Notes (Signed)
Subjective  CC:  Chief Complaint  Patient presents with  . Depression    patient unable to take cymbalta, made her sick and nauseated   . Anxiety    HPI: Cassandra [name] is a 57 y.o. female who presents to the office today to address the problems listed above in the chief complaint.  Pt on long term xanax since 2000 - but reports that [facility] had increased it 1.5mg  nightly to be effective. I had reordered 0.5mg  3 days ago. I reviewed her narcotic use database. She reports sleep is not good.   Fibromyalgia - we started cymbalta and gabapentin for her chronic pain/fibro; she took 2 doses and cymbalta and gabapentin and stopped due to nausea   Anxiety - she is not on an SSRI. I reviewed the patients updated PMH, FH, and SocHx.    Patient Active Problem List   Diagnosis Date Noted  . Immunosuppressed status ([place]) 10/07/2017    Priority: High  . Adenomatous polyp of colon 09/28/2017    Priority: High  . Chronic narcotic dependence ([place]) 11/25/2013    Priority: High  . Mixed hyperlipidemia 03/10/2013    Priority: High  . Osteoarthritis, knee 03/01/2013    Priority: High  . Chronic back pain 10/13/2012    Priority: High  . Impaired glucose tolerance 12/13/2011    Priority: High  . History of liver transplant ([place]) 02/22/2009    Priority: High  . Cervical stenosis of spine 01/25/2017    Priority: Medium  . Fibromyalgia 08/24/2013    Priority: Medium  . Hx of tuberculosis 10/13/2012    Priority: Medium  . Anxiety disorder 09/16/2007    Priority: Medium  . Preventative health care 12/06/2011   Current Meds  Medication Sig  . [START ON 10/29/2017] ALPRAZolam (XANAX) 1 MG tablet Take 1 tablet (1 mg total) by mouth at bedtime.  . cyclobenzaprine (FLEXERIL) 5 MG tablet Take 1 tablet (5 mg total) by mouth 3 (three) times daily as needed for muscle spasms.  . fluticasone (FLONASE) 50 MCG/ACT nasal spray Place into the nose.  . gabapentin (NEURONTIN) 300 MG capsule Take 1 capsule  (300 mg total) by mouth at bedtime.  . mycophenolate (CELLCEPT) 500 MG tablet Take 500 mg by mouth 2 (two) times daily.  Marland Kitchen omega-3 acid ethyl esters (LOVAZA) 1 G capsule Take 1 g by mouth 2 (two) times daily.   Marland Kitchen oxyCODONE-acetaminophen (PERCOCET) 10-325 MG per tablet Take 1 tablet by mouth 3 (three) times daily as needed. Pain  . [START ON 10/20/2017] oxyCODONE-acetaminophen (PERCOCET) 10-325 MG tablet Take by mouth.  . potassium chloride SA (K-DUR,KLOR-CON) 20 MEQ tablet Take 1 tablet (20 mEq total) by mouth 2 (two) times daily.  . PredniSONE (DELTASONE PO) Take 1 tablet by mouth. Prednisone just reduced from 5mg  to 4 mg  . ranitidine (ZANTAC) 150 MG tablet Take one tablet on empty stomach twice a day as needed for stomach pain/reflux.  . tacrolimus (PROGRAF) 1 MG capsule Take 1 capsule (1 mg total) by mouth 2 (two) times daily.  . Triamcinolone Acetonide (TRIAMCINOLONE 0.1 % CREAM : EUCERIN) CREA Apply 1 application topically 2 (two) times daily.   . [DISCONTINUED] ALPRAZolam (XANAX) 0.5 MG tablet Take 1 tablet (0.5 mg total) by mouth at bedtime.  . [DISCONTINUED] DULoxetine (CYMBALTA) 20 MG capsule Take 1 capsule (20 mg total) by mouth daily.  . [DISCONTINUED] gabapentin (NEURONTIN) 300 MG capsule Take 1 capsule (300 mg total) by mouth at bedtime.    Allergies:  Patient is allergic to erythromycin; ezetimibe; other; rofecoxib; statins; zonisamide; and nsaids. Family History: Patient family history includes Alzheimer's disease in her mother; Dementia in her mother; Diabetes in her brother and mother; Gout in her mother; Heart disease in her sister; Hypertension in her mother and sister. Social History:  Patient  reports that she has never smoked. She has never used smokeless tobacco. She reports that she does not drink alcohol or use drugs.  Review of Systems: Constitutional: Negative for fever malaise or anorexia Cardiovascular: negative for chest pain Respiratory: negative for SOB or  persistent cough Gastrointestinal: negative for abdominal pain  Objective  Vitals: BP 124/80   Pulse 84   Temp 98.6 F (37 C)  General: no acute distress , A&Ox3 Psych: alert, non-sedated. Normal speech. Appears calm Assessment  1. Primary insomnia   2. Chronic prescription benzodiazepine use   3. Fibromyalgia      Plan   Insomnia and benzo use:  Discussed how tolerance to benzo's occurs; do not want to keep increasing dose. Will refill at 1mg  nightly, encouraged to add back gabapentin to help with pain and sleep and recheck in 4 weeks.   Anxiety: consider ssri/cymbalta rechallenge or tricyclic in future.   Follow up: Return in about 1 month (around 11/14/2017) for recheck pain and sleep/anxiety.    Commons side effects, risks, benefits, and alternatives for medications and treatment plan prescribed today were discussed, and the patient expressed understanding of the given instructions. Patient is instructed to call or message via MyChart if he/she has any questions or concerns regarding our treatment plan. No barriers to understanding were identified. We discussed Red Flag symptoms and signs in detail. Patient expressed understanding regarding what to do in case of urgent or emergency type symptoms.   Medication list was reconciled, printed and provided to the patient in AVS. Patient instructions and summary information was reviewed with the patient as documented in the AVS. This note was prepared with assistance of Dragon voice recognition software. Occasional wrong-word or sound-a-like substitutions may have occurred due to the inherent limitations of voice recognition software  No orders of the defined types were placed in this encounter.  Meds ordered this encounter  Medications  . ALPRAZolam (XANAX) 1 MG tablet    Sig: Take 1 tablet (1 mg total) by mouth at bedtime.    Dispense:  30 tablet    Refill:  5  . gabapentin (NEURONTIN) 300 MG capsule    Sig: Take 1 capsule (300  mg total) by mouth at bedtime.    Dispense:  90 capsule    Refill:  1

## 2017-10-20 LAB — CMV DNA, QUANTITATIVE, PCR: CMV DNA Quant: NEGATIVE IU/mL

## 2017-10-20 LAB — EPSTEIN BARR VRS(EBV DNA BY PCR): EPSTEIN-BARR DNA QUANT, PCR: NEGATIVE {copies}/mL

## 2017-10-20 LAB — TACROLIMUS LEVEL: Tacrolimus Lvl: 3.1 ng/mL (ref 2.0–20.0)

## 2017-11-04 ENCOUNTER — Ambulatory Visit: Payer: Medicare Other | Admitting: Family Medicine

## 2017-11-04 ENCOUNTER — Encounter: Payer: Self-pay | Admitting: Family Medicine

## 2017-11-04 ENCOUNTER — Other Ambulatory Visit: Payer: Self-pay

## 2017-11-04 ENCOUNTER — Ambulatory Visit (INDEPENDENT_AMBULATORY_CARE_PROVIDER_SITE_OTHER): Payer: Medicare Other | Admitting: Family Medicine

## 2017-11-04 VITALS — BP 126/88 | HR 75 | Temp 98.4°F | Ht 64.0 in | Wt 193.6 lb

## 2017-11-04 DIAGNOSIS — G8929 Other chronic pain: Secondary | ICD-10-CM | POA: Diagnosis not present

## 2017-11-04 DIAGNOSIS — F5101 Primary insomnia: Secondary | ICD-10-CM

## 2017-11-04 DIAGNOSIS — M170 Bilateral primary osteoarthritis of knee: Secondary | ICD-10-CM

## 2017-11-04 DIAGNOSIS — M25461 Effusion, right knee: Secondary | ICD-10-CM | POA: Diagnosis not present

## 2017-11-04 DIAGNOSIS — D899 Disorder involving the immune mechanism, unspecified: Secondary | ICD-10-CM

## 2017-11-04 DIAGNOSIS — M545 Low back pain: Secondary | ICD-10-CM | POA: Diagnosis not present

## 2017-11-04 DIAGNOSIS — F1120 Opioid dependence, uncomplicated: Secondary | ICD-10-CM

## 2017-11-04 DIAGNOSIS — M25462 Effusion, left knee: Secondary | ICD-10-CM

## 2017-11-04 DIAGNOSIS — D849 Immunodeficiency, unspecified: Secondary | ICD-10-CM

## 2017-11-04 MED ORDER — TRIAMCINOLONE ACETONIDE 40 MG/ML IJ SUSP: 40.0000 mg | Freq: Once | INTRAMUSCULAR | Status: DC

## 2017-11-04 MED ORDER — TRIAMCINOLONE ACETONIDE 40 MG/ML IJ SUSP
40.0000 mg | Freq: Once | INTRAMUSCULAR | Status: AC
  Administered 2017-11-04: 40 mg via INTRA_ARTICULAR

## 2017-11-04 NOTE — Progress Notes (Signed)
Subjective  CC:  Chief Complaint  Patient presents with  . Insomnia    patient states she is unable to take Gabapentin,she states that it makes it hard to breathe when taking, Patient is taking Xanax before bed.     HPI: Cassandra [name] is a 57 y.o. female who presents to the office today to address the problems listed above in the chief complaint. Also wants to discuss knee pain and f/u anxiety.   She has OA of the  Bilateral knee(s) and is having pain that impedes her quality of life and activity level. See past notes for further documentation. No contraindications to steroids or Synvisc are identified. We have used alternative measures for pain control including tylenol, nsaids, ice, rest, and other prescribed analgesics with variable success.  She has end stage right knee djd but high surgical risk. Would like a steroid injection. Had synvisc 8 weeks ago w/o relief. Still swelling; admits has been active.   Insomnia: tried the neurontin with xanax and slept well but had SE: felt drugged: dizzy when getting up to go to bathroom and hard to awaken early.   Chronic pain: mgd by specialist. On percocet.   I reviewed the patients updated PMH, FH, and SocHx.    Patient Active Problem List   Diagnosis Date Noted  . Immunosuppressed status ([place]) 10/07/2017    Priority: High  . Adenomatous polyp of colon 09/28/2017    Priority: High  . Chronic narcotic dependence ([place]) 11/25/2013    Priority: High  . Mixed hyperlipidemia 03/10/2013    Priority: High  . Primary osteoarthritis of right knee 03/01/2013    Priority: High  . Chronic back pain 10/13/2012    Priority: High  . Impaired glucose tolerance 12/13/2011    Priority: High  . History of liver transplant ([place]) 02/22/2009    Priority: High  . Cervical stenosis of spine 01/25/2017    Priority: Medium  . Fibromyalgia 08/24/2013    Priority: Medium  . Hx of tuberculosis 10/13/2012    Priority: Medium  . Anxiety disorder  09/16/2007    Priority: Medium  . Primary insomnia 11/04/2017  . Preventative health care 12/06/2011   Current Meds  Medication Sig  . ALPRAZolam (XANAX) 1 MG tablet Take 1 tablet (1 mg total) by mouth at bedtime.  . cyclobenzaprine (FLEXERIL) 5 MG tablet Take 1 tablet (5 mg total) by mouth 3 (three) times daily as needed for muscle spasms.  . fluticasone (FLONASE) 50 MCG/ACT nasal spray Place into the nose.  . mycophenolate (CELLCEPT) 500 MG tablet Take 500 mg by mouth 2 (two) times daily.  Marland Kitchen omega-3 acid ethyl esters (LOVAZA) 1 G capsule Take 1 g by mouth 2 (two) times daily.   Marland Kitchen oxyCODONE-acetaminophen (PERCOCET) 10-325 MG tablet Take by mouth.  . potassium chloride SA (K-DUR,KLOR-CON) 20 MEQ tablet Take 1 tablet (20 mEq total) by mouth 2 (two) times daily.  . PredniSONE (DELTASONE PO) Take 1 tablet by mouth. Prednisone just reduced from 5mg  to 4 mg  . ranitidine (ZANTAC) 150 MG tablet Take one tablet on empty stomach twice a day as needed for stomach pain/reflux.  . tacrolimus (PROGRAF) 1 MG capsule Take 1 capsule (1 mg total) by mouth 2 (two) times daily.  . Triamcinolone Acetonide (TRIAMCINOLONE 0.1 % CREAM : EUCERIN) CREA Apply 1 application topically 2 (two) times daily.   . [DISCONTINUED] oxyCODONE-acetaminophen (PERCOCET) 10-325 MG per tablet Take 1 tablet by mouth 3 (three) times daily as needed. Pain  Allergies: Patient is allergic to erythromycin; ezetimibe; other; rofecoxib; statins; zonisamide; and nsaids.  Review of Systems: Constitutional: Negative for fever malaise or anorexia Cardiovascular: negative for chest pain Respiratory: negative for SOB or persistent cough Gastrointestinal: negative for abdominal pain  Objective  Vitals: BP 126/88   Pulse 75   Temp 98.4 F (36.9 C)   Ht 5\' 4"  (1.626 m)   Wt 193 lb 9.6 oz (87.8 kg)   BMI 33.23 kg/m   General: no acute distress , A&Ox3 Cardiovascular:  RRR without murmur  Knee:  Right knee is warm with effusion,  crepitus, mild limp. Skin:  Warm, no rashes  Procedure note for Knee Joint Aspiration and/or Injection:  Indications: pain relief, failed conservative therapies  Knee Arthrocentesis with Aspiration and Injection Procedure Note  Pre-operative Diagnosis: right osteoarthritis  Post-operative Diagnosis: same  Indications: Symptomatic relief of large effusion  Anesthesia: Lidocaine 1% without epinephrine without added sodium bicarbonate  Procedure Details   Verbal consent was obtained for the procedure. Universal time out taken.  The Knee joint was prepped with alcohol and Betadine and an 18 gauge needle was inserted into the joint from the lateral approach.  45 ml of initially serosanguineous then changing to clear fluid was removed from the joint and discarded. Four ml 1% lidocaine and one ml of triamcinolone (KENALOG) 40mg /ml was then injected into the joint through the same needle. The needle was removed and the area cleansed and dressed.  Complications:  None; patient tolerated the procedure well.   Assessment  1. Primary osteoarthritis of both knees   2. Effusion of left knee   3. Immunosuppressed status ([place])   4. Primary insomnia      Plan   S/p intra-articular knee joint aspiration and steroid injection:  Routine post procedure care discussed. Rest, Ice, Nsaids as needed and ROM exercises recommended. F/u care printed out for patient in AVS.   Discussed urgent f/u if develops increased pain, redness or fever.   Insomnia: Patient prefers to use Xanax in spite of risks outlined.  No longer would like to use gabapentin.  Controlled substance agreement signed.  Xanax 1 mg nightly to be continued.  Urine drug screen is up-to-date.  Chronic pain and chronic narcotics managed by another office.  Follow up: Return in about 3 months (around 02/04/2018) for complete physical.    Commons side effects, risks, benefits, and alternatives for medications and treatment plan prescribed  today were discussed, and the patient expressed understanding of the given instructions. Patient is instructed to call or message via MyChart if he/she has any questions or concerns regarding our treatment plan. No barriers to understanding were identified. We discussed Red Flag symptoms and signs in detail. Patient expressed understanding regarding what to do in case of urgent or emergency type symptoms.   Medication list was reconciled, printed and provided to the patient in AVS. Patient instructions and summary information was reviewed with the patient as documented in the AVS. This note was prepared with assistance of Dragon voice recognition software. Occasional wrong-word or sound-a-like substitutions may have occurred due to the inherent limitations of voice recognition software  No orders of the defined types were placed in this encounter.  Meds ordered this encounter  Medications  . DISCONTD: triamcinolone acetonide (KENALOG-40) injection 40 mg  . triamcinolone acetonide (KENALOG-40) injection 40 mg         Knowing her

## 2017-11-04 NOTE — Patient Instructions (Addendum)
Please schedule a follow up appointment with me in 3 months for your physical.

## 2017-11-21 ENCOUNTER — Other Ambulatory Visit (INDEPENDENT_AMBULATORY_CARE_PROVIDER_SITE_OTHER): Payer: Medicare Other

## 2017-11-21 DIAGNOSIS — Z944 Liver transplant status: Secondary | ICD-10-CM

## 2017-11-21 DIAGNOSIS — K754 Autoimmune hepatitis: Secondary | ICD-10-CM

## 2017-11-21 LAB — COMPREHENSIVE METABOLIC PANEL
ALBUMIN: 4.2 g/dL (ref 3.5–5.2)
ALK PHOS: 84 U/L (ref 39–117)
ALT: 21 U/L (ref 0–35)
AST: 16 U/L (ref 0–37)
BILIRUBIN TOTAL: 0.4 mg/dL (ref 0.2–1.2)
BUN: 25 mg/dL — AB (ref 6–23)
CO2: 22 mEq/L (ref 19–32)
Calcium: 9.6 mg/dL (ref 8.4–10.5)
Chloride: 108 mEq/L (ref 96–112)
Creatinine, Ser: 1.24 mg/dL — ABNORMAL HIGH (ref 0.40–1.20)
GFR: 57.23 mL/min — ABNORMAL LOW (ref >60.00)
Glucose, Bld: 103 mg/dL — ABNORMAL HIGH (ref 70–99)
POTASSIUM: 3.9 meq/L (ref 3.5–5.1)
SODIUM: 139 meq/L (ref 135–145)
TOTAL PROTEIN: 7.1 g/dL (ref 6.0–8.3)

## 2017-11-21 LAB — CBC WITH DIFFERENTIAL/PLATELET
BASOS ABS: 0.1 10*3/uL (ref 0.0–0.1)
Basophils Relative: 1.7 % (ref 0.0–3.0)
EOS ABS: 0.2 10*3/uL (ref 0.0–0.7)
EOS PCT: 3.7 % (ref 0.0–5.0)
HCT: 41.1 % (ref 36.0–46.0)
HEMOGLOBIN: 14 g/dL (ref 12.0–15.0)
LYMPHS ABS: 2.4 10*3/uL (ref 0.7–4.0)
Lymphocytes Relative: 41.2 % (ref 12.0–46.0)
MCHC: 34.1 g/dL (ref 30.0–36.0)
MCV: 86.8 fl (ref 78.0–100.0)
MONO ABS: 0.6 10*3/uL (ref 0.1–1.0)
Monocytes Relative: 11.1 % (ref 3.0–12.0)
NEUTROS PCT: 42.3 % — AB (ref 43.0–77.0)
Neutro Abs: 2.4 10*3/uL (ref 1.4–7.7)
Platelets: 153 10*3/uL (ref 150.0–400.0)
RBC: 4.74 Mil/uL (ref 3.87–5.11)
RDW: 13.9 % (ref 11.5–15.5)
WBC: 5.8 10*3/uL (ref 4.0–10.5)

## 2017-11-22 LAB — TACROLIMUS LEVEL: Tacrolimus (FK506), Blood: 4.7 ng/mL — ABNORMAL LOW

## 2017-12-27 DIAGNOSIS — M47816 Spondylosis without myelopathy or radiculopathy, lumbar region: Secondary | ICD-10-CM | POA: Insufficient documentation

## 2018-01-11 ENCOUNTER — Other Ambulatory Visit: Payer: Self-pay | Admitting: Family Medicine

## 2018-02-19 ENCOUNTER — Telehealth: Payer: Self-pay | Admitting: Family Medicine

## 2018-02-19 NOTE — Telephone Encounter (Signed)
Last OV: 11/04/2017 Last Fill: 10/2017

## 2018-02-19 NOTE — Telephone Encounter (Signed)
Copied from [place]. Topic: General - Other >> Feb 19, 2018  9:31 AM Lennox Solders wrote: Reason for CRM: pt is calling and would like 35 pills of alprazolam sent to. [facility] [address]. Pt sometimes takes 2 pills at night to sleep

## 2018-02-20 NOTE — Telephone Encounter (Signed)
Patient informed and verbalized understanding. I advised Patient to call [facility] and see if she can get the prescription transferred.   Doloris Hall,  LPN

## 2018-02-20 NOTE — Telephone Encounter (Signed)
Pt has refills on her medication. May call [facility] for refill. I do not recommend taking 2 at night, only one as directed.

## 2018-03-06 ENCOUNTER — Other Ambulatory Visit (INDEPENDENT_AMBULATORY_CARE_PROVIDER_SITE_OTHER): Payer: Medicare Other

## 2018-03-06 DIAGNOSIS — Z944 Liver transplant status: Secondary | ICD-10-CM

## 2018-03-06 DIAGNOSIS — K754 Autoimmune hepatitis: Secondary | ICD-10-CM | POA: Diagnosis not present

## 2018-03-06 LAB — COMPREHENSIVE METABOLIC PANEL
ALBUMIN: 3.8 g/dL (ref 3.5–5.2)
ALK PHOS: 77 U/L (ref 39–117)
ALT: 23 U/L (ref 0–35)
AST: 15 U/L (ref 0–37)
BUN: 23 mg/dL (ref 6–23)
CO2: 26 mEq/L (ref 19–32)
CREATININE: 1.2 mg/dL (ref 0.40–1.20)
Calcium: 9 mg/dL (ref 8.4–10.5)
Chloride: 107 mEq/L (ref 96–112)
GFR: 59.38 mL/min — ABNORMAL LOW (ref >60.00)
Glucose, Bld: 96 mg/dL (ref 70–99)
POTASSIUM: 3.6 meq/L (ref 3.5–5.1)
SODIUM: 139 meq/L (ref 135–145)
Total Bilirubin: 0.5 mg/dL (ref 0.2–1.2)
Total Protein: 6.8 g/dL (ref 6.0–8.3)

## 2018-03-06 LAB — CBC WITH DIFFERENTIAL/PLATELET
Basophils Absolute: 0 10*3/uL (ref 0.0–0.1)
Basophils Relative: 0.7 % (ref 0.0–3.0)
Eosinophils Absolute: 0.3 10*3/uL (ref 0.0–0.7)
Eosinophils Relative: 4.3 % (ref 0.0–5.0)
HCT: 40.6 % (ref 36.0–46.0)
HEMOGLOBIN: 13.5 g/dL (ref 12.0–15.0)
Lymphocytes Relative: 36.1 % (ref 12.0–46.0)
Lymphs Abs: 2.4 10*3/uL (ref 0.7–4.0)
MCHC: 33.2 g/dL (ref 30.0–36.0)
MCV: 87 fl (ref 78.0–100.0)
MONO ABS: 0.6 10*3/uL (ref 0.1–1.0)
Monocytes Relative: 8.7 % (ref 3.0–12.0)
Neutro Abs: 3.3 10*3/uL (ref 1.4–7.7)
Neutrophils Relative %: 50.2 % (ref 43.0–77.0)
Platelets: 181 10*3/uL (ref 150.0–400.0)
RBC: 4.66 Mil/uL (ref 3.87–5.11)
RDW: 13.1 % (ref 11.5–15.5)
WBC: 6.6 10*3/uL (ref 4.0–10.5)

## 2018-03-07 LAB — TACROLIMUS LEVEL: Tacrolimus (FK506), Blood: 5.3 ng/mL

## 2018-03-10 ENCOUNTER — Other Ambulatory Visit: Payer: Self-pay

## 2018-03-10 ENCOUNTER — Ambulatory Visit (INDEPENDENT_AMBULATORY_CARE_PROVIDER_SITE_OTHER): Payer: Medicare Other | Admitting: Family Medicine

## 2018-03-10 ENCOUNTER — Encounter: Payer: Self-pay | Admitting: Family Medicine

## 2018-03-10 VITALS — BP 122/84 | HR 96 | Temp 98.0°F | Ht 64.0 in | Wt 199.6 lb

## 2018-03-10 DIAGNOSIS — M170 Bilateral primary osteoarthritis of knee: Secondary | ICD-10-CM

## 2018-03-10 DIAGNOSIS — D899 Disorder involving the immune mechanism, unspecified: Secondary | ICD-10-CM | POA: Diagnosis not present

## 2018-03-10 DIAGNOSIS — M797 Fibromyalgia: Secondary | ICD-10-CM

## 2018-03-10 DIAGNOSIS — D849 Immunodeficiency, unspecified: Secondary | ICD-10-CM

## 2018-03-10 DIAGNOSIS — F5101 Primary insomnia: Secondary | ICD-10-CM

## 2018-03-10 MED ORDER — DIAZEPAM 5 MG PO TABS: 5.0000 mg | ORAL_TABLET | Freq: Every day | ORAL | 2 refills | Status: DC

## 2018-03-10 MED ORDER — TRIAMCINOLONE ACETONIDE 40 MG/ML IJ SUSP
40.0000 mg | Freq: Once | INTRAMUSCULAR | Status: AC
  Administered 2018-03-10: 40 mg via INTRA_ARTICULAR

## 2018-03-10 NOTE — Patient Instructions (Signed)
Please return in 3 months for Recheck of your sleep medications.  If you have any questions or concerns, please don't hesitate to send me a message via MyChart or call the office at [phone]. Thank you for visiting with Cassandra [name] today! It's our pleasure caring for you.  Let's change to valium for 2-4 weeks, then start slowly tapering over the next 3-6 months.   Work on [employer].   Insomnia Insomnia is a sleep disorder that makes it difficult to fall asleep or to stay asleep. Insomnia can cause tiredness (fatigue), low energy, difficulty concentrating, mood swings, and poor performance at work or school. There are three different ways to classify insomnia:  Difficulty falling asleep.  Difficulty staying asleep.  Waking up too early in the morning.  Any type of insomnia can be long-term (chronic) or short-term (acute). Both are common. Short-term insomnia usually lasts for three months or less. Chronic insomnia occurs at least three times a week for longer than three months. What are the causes? Insomnia may be caused by another condition, situation, or substance, such as:  Anxiety.  Certain medicines.  Gastroesophageal reflux disease (GERD) or other gastrointestinal conditions.  Asthma or other breathing conditions.  Restless legs syndrome, sleep apnea, or other sleep disorders.  Chronic pain.  Menopause. This may include hot flashes.  Stroke.  Abuse of alcohol, tobacco, or illegal drugs.  Depression.  Caffeine.  Neurological disorders, such as Alzheimer disease.  An overactive thyroid (hyperthyroidism).  The cause of insomnia may not be known. What increases the risk? Risk factors for insomnia include:  Gender. Women are more commonly affected than men.  Age. Insomnia is more common as you get older.  Stress. This may involve your professional or personal life.  Income. Insomnia is more common in people with lower income.  Lack of exercise.  Irregular  work schedule or night shifts.  Traveling between different time zones.  What are the signs or symptoms? If you have insomnia, trouble falling asleep or trouble staying asleep is the main symptom. This may lead to other symptoms, such as:  Feeling fatigued.  Feeling nervous about going to sleep.  Not feeling rested in the morning.  Having trouble concentrating.  Feeling irritable, anxious, or depressed.  How is this treated? Treatment for insomnia depends on the cause. If your insomnia is caused by an underlying condition, treatment will focus on addressing the condition. Treatment may also include:  Medicines to help you sleep.  Counseling or therapy.  Lifestyle adjustments.  Follow these instructions at home:  Take medicines only as directed by your health care provider.  Keep regular sleeping and waking hours. Avoid naps.  Keep a sleep diary to help you and your health care provider figure out what could be causing your insomnia. Include: ? When you sleep. ? When you wake up during the night. ? How well you sleep. ? How rested you feel the next day. ? Any side effects of medicines you are taking. ? What you eat and drink.  Make your bedroom a comfortable place where it is easy to fall asleep: ? Put up shades or special blackout curtains to block light from outside. ? Use a white noise machine to block noise. ? Keep the temperature cool.  Exercise regularly as directed by your health care provider. Avoid exercising right before bedtime.  Use relaxation techniques to manage stress. Ask your health care provider to suggest some techniques that may work well for you. These may include: ?  Breathing exercises. ? Routines to release muscle tension. ? Visualizing peaceful scenes.  Cut back on alcohol, caffeinated beverages, and cigarettes, especially close to bedtime. These can disrupt your sleep.  Do not overeat or eat spicy foods right before bedtime. This can lead  to digestive discomfort that can make it hard for you to sleep.  Limit screen use before bedtime. This includes: ? Watching TV. ? Using your smartphone, tablet, and computer.  Stick to a routine. This can help you fall asleep faster. Try to do a quiet activity, brush your teeth, and go to bed at the same time each night.  Get out of bed if you are still awake after 15 minutes of trying to sleep. Keep the lights down, but try reading or doing a quiet activity. When you feel sleepy, go back to bed.  Make sure that you drive carefully. Avoid driving if you feel very sleepy.  Keep all follow-up appointments as directed by your health care provider. This is important. Contact a health care provider if:  You are tired throughout the day or have trouble in your daily routine due to sleepiness.  You continue to have sleep problems or your sleep problems get worse. Get help right away if:  You have serious thoughts about hurting yourself or someone else. This information is not intended to replace advice given to you by your health care provider. Make sure you discuss any questions you have with your health care provider. Document Released: 05/18/2000 Document Revised: 10/21/2015 Document Reviewed: 02/19/2014 Elsevier Interactive Patient Education  [employer].

## 2018-03-10 NOTE — Progress Notes (Signed)
Subjective  CC:  Chief Complaint  Patient presents with  . Knee Pain    right knee pain, swollen and she states it needs drained     HPI: Cassandra [name] is a 57 y.o. female who presents to the office today to address the problems listed above in the chief complaint.  She has OA of the  Bilateral knee(s) and is having pain that impedes her quality of life and activity level. See past notes for further documentation. Last asp/injection of right knee in June. Has severe OA documented and evaluated by ortho. Doesn't want replacement. Has been more active over the last several weeks and thus effusion is back with pain.  No contraindications to steroids or Synvisc are identified. We have used alternative measures for pain control including tylenol, nsaids, ice, rest, and other prescribed analgesics with variable success. We discussed in detail the risks of infection and bleeding; she is immunosuppressed.    Follow-up insomnia: Has been using Xanax 1 mg nightly for sleep.  We have tried multiple other options which she did not do well with including Belsomra, gabapentin, Cymbalta.  She also has fibromyalgia.  She has been increasing her use of Xanax to 2 mg on certain nights.  She notices when she goes without she is very restless.  She does have a controlled substance agreement contract signed.  She would like to get off sleep medicines if possible.  We discussed her sleep hygiene.  I reviewed the patients updated PMH, FH, and SocHx.    Patient Active Problem List   Diagnosis Date Noted  . Immunosuppressed status ([place]) 10/07/2017    Priority: High  . Adenomatous polyp of colon 09/28/2017    Priority: High  . Chronic narcotic dependence ([place]) 11/25/2013    Priority: High  . Mixed hyperlipidemia 03/10/2013    Priority: High  . Primary osteoarthritis of right knee 03/01/2013    Priority: High  . Chronic back pain 10/13/2012    Priority: High  . Impaired glucose tolerance 12/13/2011   Priority: High  . History of liver transplant ([place]) 02/22/2009    Priority: High  . Cervical stenosis of spine 01/25/2017    Priority: Medium  . Fibromyalgia 08/24/2013    Priority: Medium  . Hx of tuberculosis 10/13/2012    Priority: Medium  . Anxiety disorder 09/16/2007    Priority: Medium  . Lumbar facet arthropathy 12/27/2017  . Primary insomnia 11/04/2017  . Preventative health care 12/06/2011   Current Meds  Medication Sig  . mycophenolate (CELLCEPT) 500 MG tablet Take 500 mg by mouth 2 (two) times daily.  . PredniSONE (DELTASONE PO) Take 1 tablet by mouth. Prednisone just reduced from 5mg  to 4 mg  . tacrolimus (PROGRAF) 1 MG capsule Take 1 capsule (1 mg total) by mouth 2 (two) times daily.  . [DISCONTINUED] ALPRAZolam (XANAX) 1 MG tablet Take 1 tablet (1 mg total) by mouth at bedtime.  . [DISCONTINUED] cyclobenzaprine (FLEXERIL) 5 MG tablet Take 1 tablet (5 mg total) by mouth 3 (three) times daily as needed for muscle spasms.  . [DISCONTINUED] fluticasone (FLONASE) 50 MCG/ACT nasal spray Place into the nose.  . [DISCONTINUED] omega-3 acid ethyl esters (LOVAZA) 1 G capsule Take 1 g by mouth 2 (two) times daily.   . [DISCONTINUED] potassium chloride SA (K-DUR,KLOR-CON) 20 MEQ tablet Take 1 tablet (20 mEq total) by mouth 2 (two) times daily.  . [DISCONTINUED] ranitidine (ZANTAC) 150 MG tablet Take one tablet on empty stomach twice a day  as needed for stomach pain/reflux.  . [DISCONTINUED] Triamcinolone Acetonide (TRIAMCINOLONE 0.1 % CREAM : EUCERIN) CREA Apply 1 application topically 2 (two) times daily.     Allergies: Patient is allergic to erythromycin; ezetimibe; other; rofecoxib; statins; zonisamide; and nsaids.  Review of Systems: Constitutional: Negative for fever malaise or anorexia Cardiovascular: negative for chest pain Respiratory: negative for SOB or persistent cough Gastrointestinal: negative for abdominal pain  Objective  Vitals: BP 122/84   Pulse 96   Temp  98 F (36.7 C)   Ht 5\' 4"  (1.626 m)   Wt 199 lb 9.6 oz (90.5 kg)   SpO2 99%   BMI 34.26 kg/m  General: no acute distress , A&Ox3 Cardiovascular:  RRR without murmur  Knee:  Right efffusion w/o warmth or redness. + crepitus. From. Limping. No popliteal fullnes. No cords Skin:  Warm, no rashes  Procedure note for Knee Joint Aspiration and/or Injection:  Indications: pain relief, failed conservative therapies  Knee Arthrocentesis with Aspiration and Injection Procedure Note  Pre-operative Diagnosis: right osteoarthritis, severe with effusion  Post-operative Diagnosis: same  Indications: Symptomatic relief of large effusion  Anesthesia: Lidocaine 1% without epinephrine without added sodium bicarbonate  Procedure Details   Verbal consent was obtained for the procedure. Universal time out taken.  The right knee joint was prepped with alcohol and an 18 gauge needle was inserted into the joint from the lateral approach.  40 ml of clear yellow fluid was removed from the joint and discarded. Four ml 1% lidocaine and one ml of triamcinolone (KENALOG) 40mg /ml was then injected into the joint through the same needle. The needle was removed and the area cleansed and dressed.  Complications:  None; patient tolerated the procedure well.   Assessment  1. Primary osteoarthritis of both knees   2. Primary insomnia   3. Fibromyalgia   4. Immunosuppressed status ([place])      Plan   S/p intra-articular knee joint aspiration and steroid injection:  Routine post procedure care discussed. Rest, Ice, Nsaids as needed and ROM exercises recommended. F/u care printed out for patient in AVS. patient to monitor closely for signs of infection.  Discussed urgent f/u if develops increased pain, redness or fever.   Insomnia, coupled with fibromyalgia and chronic pain on narcotics.  Educated.  Would favor weaning off benzodiazepines.  In efforts to do so, will change to longer acting Valium nightly.  Then  over the course of the next 3 to 6 months try to wean slowly.  Discussed sleep hygiene.  Recheck 3 months  Follow up: Return in about 3 months (around 06/10/2018) for Recheck sleep medicines.    Commons side effects, risks, benefits, and alternatives for medications and treatment plan prescribed today were discussed, and the patient expressed understanding of the given instructions. Patient is instructed to call or message via MyChart if he/she has any questions or concerns regarding our treatment plan. No barriers to understanding were identified. We discussed Red Flag symptoms and signs in detail. Patient expressed understanding regarding what to do in case of urgent or emergency type symptoms.   Medication list was reconciled, printed and provided to the patient in AVS. Patient instructions and summary information was reviewed with the patient as documented in the AVS. This note was prepared with assistance of Dragon voice recognition software. Occasional wrong-word or sound-a-like substitutions may have occurred due to the inherent limitations of voice recognition software  No orders of the defined types were placed in this encounter.  Meds ordered this  encounter  Medications  . triamcinolone acetonide (KENALOG-40) injection 40 mg  . diazepam (VALIUM) 5 MG tablet    Sig: Take 1 tablet (5 mg total) by mouth at bedtime.    Dispense:  30 tablet    Refill:  2

## 2018-03-11 ENCOUNTER — Telehealth: Payer: Self-pay | Admitting: Family Medicine

## 2018-03-11 DIAGNOSIS — Z0279 Encounter for issue of other medical certificate: Secondary | ICD-10-CM

## 2018-03-11 NOTE — Telephone Encounter (Signed)
Received disability forms by mail, placed in bin w/charge sheet.

## 2018-03-12 NOTE — Telephone Encounter (Signed)
Disability Forms placed in Dr. Tamela Oddi inbox.   Doloris Hall,  LPN

## 2018-03-13 NOTE — Telephone Encounter (Signed)
Completed. Placed in clerical staff box.

## 2018-03-13 NOTE — Telephone Encounter (Signed)
Picked forms up and Mailed back in the envelope provided.

## 2018-03-24 NOTE — Telephone Encounter (Signed)
Pt called in wanting to know if the forms were mailed to the address on the envelope.   Also pt would like a copy of paper work sent to her address on file.

## 2018-03-28 DIAGNOSIS — M47812 Spondylosis without myelopathy or radiculopathy, cervical region: Secondary | ICD-10-CM | POA: Insufficient documentation

## 2018-04-21 ENCOUNTER — Other Ambulatory Visit: Payer: Self-pay | Admitting: Family Medicine

## 2018-04-21 NOTE — Telephone Encounter (Signed)
Patient would like that to be sent to [place], [place]

## 2018-04-21 NOTE — Telephone Encounter (Signed)
Copied from [place] [phone]. Topic: Quick Communication - See Telephone Encounter >> Apr 21, 2018 12:57 PM Conception Chancy, NT wrote: CRM for notification. See Telephone encounter for: 04/21/18.  Patient is calling and requesting a refill on ALPRAZolam (XANAX) 1 MG tablet. She states the prescription she had has expired.   [id number] [place], [place] [address] [place] [address] Phone: [phone] Fax: [phone]

## 2018-04-22 NOTE — Telephone Encounter (Signed)
Patient called and advised Xanax is not on her current medication profile. She says that when she was in the office on 03/10/18, Dr. Tamela Oddi plan was for her to wean off of Xanax and she's on Valium while that is being done. She says she received a refill on 03/19/18 and would like another refill, which was confirmed by her reading off the information from the bottle. I advised I will send the request to Dr. Jonni Sanger and someone from the office will call if she has any recommendations, patient verbalized understanding.  [facility] [place], [place] AT [facility] OF [place] [phone] (Phone) [phone] (Fax)

## 2018-04-25 NOTE — Telephone Encounter (Signed)
Please advise 

## 2018-04-25 NOTE — Telephone Encounter (Signed)
Patient is calling to follow up on this request

## 2018-04-25 NOTE — Telephone Encounter (Signed)
Pt was given valium #30 with 2 refills. She should be able to call pharmacy to get her valium refilled.

## 2018-04-25 NOTE — Telephone Encounter (Signed)
Pt is aware of refills for Valium at pharmacy.

## 2018-05-07 ENCOUNTER — Emergency Department (HOSPITAL_BASED_OUTPATIENT_CLINIC_OR_DEPARTMENT_OTHER)
Admission: EM | Admit: 2018-05-07 | Discharge: 2018-05-07 | Disposition: A | Discharge status: Home / Self Care | Payer: Medicare Other | Attending: Emergency Medicine | Admitting: Emergency Medicine

## 2018-05-07 ENCOUNTER — Ambulatory Visit: Payer: Self-pay | Admitting: *Deleted

## 2018-05-07 ENCOUNTER — Emergency Department (HOSPITAL_BASED_OUTPATIENT_CLINIC_OR_DEPARTMENT_OTHER): Payer: Medicare Other

## 2018-05-07 ENCOUNTER — Other Ambulatory Visit: Payer: Self-pay

## 2018-05-07 ENCOUNTER — Encounter (HOSPITAL_BASED_OUTPATIENT_CLINIC_OR_DEPARTMENT_OTHER): Payer: Self-pay

## 2018-05-07 DIAGNOSIS — F329 Major depressive disorder, single episode, unspecified: Secondary | ICD-10-CM | POA: Diagnosis not present

## 2018-05-07 DIAGNOSIS — F419 Anxiety disorder, unspecified: Secondary | ICD-10-CM | POA: Diagnosis not present

## 2018-05-07 DIAGNOSIS — N189 Chronic kidney disease, unspecified: Secondary | ICD-10-CM | POA: Diagnosis not present

## 2018-05-07 DIAGNOSIS — R200 Anesthesia of skin: Secondary | ICD-10-CM

## 2018-05-07 DIAGNOSIS — Z79899 Other long term (current) drug therapy: Secondary | ICD-10-CM | POA: Insufficient documentation

## 2018-05-07 DIAGNOSIS — Z944 Liver transplant status: Secondary | ICD-10-CM | POA: Diagnosis not present

## 2018-05-07 DIAGNOSIS — R202 Paresthesia of skin: Secondary | ICD-10-CM | POA: Insufficient documentation

## 2018-05-07 LAB — CBC WITH DIFFERENTIAL/PLATELET
Abs Immature Granulocytes: 0.03 10*3/uL (ref 0.00–0.07)
BASOS PCT: 0 %
Basophils Absolute: 0 10*3/uL (ref 0.0–0.1)
Eosinophils Absolute: 0.2 10*3/uL (ref 0.0–0.5)
Eosinophils Relative: 3 %
HCT: 40.7 % (ref 36.0–46.0)
Hemoglobin: 13.3 g/dL (ref 12.0–15.0)
Immature Granulocytes: 0 %
Lymphocytes Relative: 29 %
Lymphs Abs: 2 10*3/uL (ref 0.7–4.0)
MCH: 28.6 pg (ref 26.0–34.0)
MCHC: 32.7 g/dL (ref 30.0–36.0)
MCV: 87.5 fL (ref 80.0–100.0)
Monocytes Absolute: 0.7 10*3/uL (ref 0.1–1.0)
Monocytes Relative: 11 %
Neutro Abs: 3.8 10*3/uL (ref 1.7–7.7)
Neutrophils Relative %: 57 %
Platelets: 183 10*3/uL (ref 150–400)
RBC: 4.65 MIL/uL (ref 3.87–5.11)
RDW: 13.2 % (ref 11.5–15.5)
WBC: 6.7 10*3/uL (ref 4.0–10.5)
nRBC: 0 % (ref 0.0–0.2)

## 2018-05-07 LAB — URINALYSIS, ROUTINE W REFLEX MICROSCOPIC
Bilirubin Urine: NEGATIVE
Glucose, UA: NEGATIVE mg/dL
Hgb urine dipstick: NEGATIVE
Ketones, ur: NEGATIVE mg/dL
LEUKOCYTES UA: NEGATIVE
NITRITE: NEGATIVE
PH: 5.5 (ref 5.0–8.0)
Protein, ur: NEGATIVE mg/dL
Specific Gravity, Urine: 1.02 (ref 1.005–1.030)

## 2018-05-07 LAB — RAPID URINE DRUG SCREEN, HOSP PERFORMED
AMPHETAMINES: NOT DETECTED
Barbiturates: NOT DETECTED
Benzodiazepines: POSITIVE — AB
Cocaine: NOT DETECTED
Opiates: NOT DETECTED
TETRAHYDROCANNABINOL: NOT DETECTED

## 2018-05-07 LAB — COMPREHENSIVE METABOLIC PANEL
ALT: 23 U/L (ref 0–44)
AST: 27 U/L (ref 15–41)
Albumin: 4.1 g/dL (ref 3.5–5.0)
Alkaline Phosphatase: 78 U/L (ref 38–126)
Anion gap: 8 (ref 5–15)
BUN: 28 mg/dL — ABNORMAL HIGH (ref 6–20)
CO2: 24 mmol/L (ref 22–32)
Calcium: 9.7 mg/dL (ref 8.9–10.3)
Chloride: 107 mmol/L (ref 98–111)
Creatinine, Ser: 1.39 mg/dL — ABNORMAL HIGH (ref 0.44–1.00)
GFR calc Af Amer: 49 mL/min — ABNORMAL LOW (ref >60)
GFR calc non Af Amer: 42 mL/min — ABNORMAL LOW (ref >60)
Glucose, Bld: 93 mg/dL (ref 70–99)
Potassium: 4.2 mmol/L (ref 3.5–5.1)
SODIUM: 139 mmol/L (ref 135–145)
Total Bilirubin: 0.5 mg/dL (ref 0.3–1.2)
Total Protein: 7.1 g/dL (ref 6.5–8.1)

## 2018-05-07 LAB — PROTIME-INR
INR: 1.02
PROTHROMBIN TIME: 13.4 s (ref 11.4–15.2)

## 2018-05-07 LAB — APTT: aPTT: 26 seconds (ref 24–36)

## 2018-05-07 LAB — ETHANOL: Alcohol, Ethyl (B): <10 mg/dL (ref <10)

## 2018-05-07 LAB — TROPONIN I: Troponin I: <0.03 ng/mL (ref <0.03)

## 2018-05-07 NOTE — Telephone Encounter (Signed)
Pt reports "Between 12:30 and 2:00pm"  yesterday was sitting in her car when experienced sudden, severe right sided headache. States at that time her left arm started tingling, then went numb as well as numbness and tingling of the left side of her face. States duration of 10 minutes. Also reports dizziness "That lasted longer" and visual changes at that time. No symptoms presently, "Feel ok today, just real tired."  States had been taking valium regularly but has not had in 2 days. Pt is not aware of what BP has been ranging. Speech clear during call. Pt directed to ED, states will follow disposition, she will call her daughter to drive.   Reason for Disposition . [1] Numbness (i.e., loss of sensation) of the face, arm / hand, or leg / foot on one side of the body AND [2] sudden onset AND [3] brief (now gone)  Answer Assessment - Initial Assessment Questions 1. SYMPTOM: "What is the main symptom you are concerned about?" (e.g., weakness, numbness)      2. ONSET: "When did this start?" (minutes, hours, days; while sleeping)     3. LAST NORMAL: "When was the last time you were normal (no symptoms)?"     .1230-1400 yesterday 4. PATTERN "Does this come and go, or has it been constant since it started?"  "Is it present now?"    One episode 5. CARDIAC SYMPTOMS: "Have you had any of the following symptoms: chest pain, difficulty breathing, palpitations?"      6. NEUROLOGIC SYMPTOMS: "Have you had any of the following symptoms: headache, dizziness, vision loss, double vision, changes in speech, unsteady on your feet?"     *No Answer* 7. OTHER SYMPTOMS: "Do you have any other symptoms?"     *No Answer*  Protocols used: NEUROLOGIC DEFICIT-A-AH

## 2018-05-07 NOTE — ED Triage Notes (Addendum)
Pt c/o numbness/tingling to left side of face and left UE started ~1p yesterday while driving-c/o pain to right forehead x 3 days-dizziness and blurred vision x 2-3 days-all sx intermittent/denies sx at this time-NAD-steady gait

## 2018-05-07 NOTE — Discharge Instructions (Signed)
You were seen in the ED today with numbness in the face and arm. Your labs and CT are normal here. Call the Neurologist listed to schedule the next available appointment. Call 911 if numbness or weakness symptoms return.

## 2018-05-07 NOTE — ED Provider Notes (Signed)
Emergency Department Provider Note   I have reviewed the triage vital signs and the nursing notes.   HISTORY  Chief Complaint Numbness   HPI Cassandra [name] is a 57 y.o. female with PMH of autoimmune hepatitis s/p liver transplant, fibromyalgia, GERD, and HLD's to the emergency department for evaluation of acute onset left face, arm, leg numbness.  Symptoms began yesterday around 12:30 PM and lasted for approximately 20 to 30 minutes prior to resolving completely.  Patient returned home and eventually called her PCP today regarding an outpatient appointment for the symptoms and was referred to the emergency department.  She has not had return of numbness.  She denies expensing any weakness, voice changes, difficult to swallowing, vision changes.  She notes a history in 2018 where she had some right arm pain and weakness.  She was sent to the emergency department for MRI with no stroke identified at that time but did have some cervical stenosis.  She denies any neck pain today and states that this did not feel similar to that episode.  Patient has also noticed a constant, indigestion type feeling in her chest and epigastric region.  Symptoms not worse with acute onset numbness.    Past Medical History:  Diagnosis Date  . Anxiety   . Autoimmune hepatitis ([place])   . Chronic renal insufficiency   . Cirrhosis ([place])   . COLONIC POLYPS, ADENOMATOUS, HX OF 05/05/2008   Qualifier: Diagnosis of  By: [name], [name]    . Depression   . External hemorrhoids   . Fibromyalgia   . GERD (gastroesophageal reflux disease)   . Hyperlipidemia   . Internal hemorrhoids   . Iron deficiency anemia   . Liver replaced by transplant ([place]) 02/22/2009   Qualifier: Diagnosis of  By: Chester Holstein NP, Nevin Bloodgood    . 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 Lumbar facet arthropathy 12/27/2017  . Primary insomnia 11/04/2017  . Immunosuppressed  status ([place]) 10/07/2017  . Adenomatous polyp of colon 09/28/2017  . Cervical stenosis of spine 01/25/2017  . Chronic narcotic dependence ([place]) 11/25/2013  . Fibromyalgia 08/24/2013  . Mixed hyperlipidemia 03/10/2013  . Primary osteoarthritis of right knee 03/01/2013  . Chronic back pain 10/13/2012  . Hx of tuberculosis 10/13/2012  . Impaired glucose tolerance 12/13/2011  . Preventative health care 12/06/2011  . History of liver transplant ([place]) 02/22/2009  . Anxiety disorder 09/16/2007    Past Surgical History:  Procedure Laterality Date  . ABDOMINAL HYSTERECTOMY    . KNEE ARTHROSCOPY    . LIVER TRANSPLANT  1992  . ROTATOR CUFF REPAIR      Allergies Erythromycin; Ezetimibe; Other; Rofecoxib; Statins; Zonisamide; and Nsaids  Family History  Problem Relation Age of Onset  . Diabetes Brother   . Heart disease Sister   . Hypertension Sister   . Diabetes Mother   . Dementia Mother   . Alzheimer's disease Mother   . Gout Mother   . Hypertension Mother   . Colon cancer Neg Hx     Social History Social History   Tobacco Use  . Smoking status: Never Smoker  . Smokeless tobacco: Never Used  Substance Use Topics  . Alcohol use: No    Alcohol/week: 0.0 standard drinks  . Drug use: No    Review of Systems  Constitutional: No fever/chills Eyes: No visual changes. ENT: No sore throat. Cardiovascular: Denies chest pain. Respiratory: Denies shortness  of breath. Gastrointestinal: No abdominal pain.  No nausea, no vomiting.  No diarrhea.  No constipation. Genitourinary: Negative for dysuria. Musculoskeletal: Negative for back pain. Skin: Negative for rash. Neurological: Negative for headaches. Positive left arm/leg numbness  10-point ROS otherwise negative.  ____________________________________________   PHYSICAL EXAM:  VITAL SIGNS: ED Triage Vitals  Enc Vitals Group     BP 05/07/18 1657 (!) 135/99     Pulse Rate 05/07/18 1657 91     Resp 05/07/18 1657 20      Temp 05/07/18 1657 98.9 F (37.2 C)     Temp Source 05/07/18 1657 Oral     SpO2 05/07/18 1657 100 %     Weight 05/07/18 1657 199 lb (90.3 kg)     Height 05/07/18 1657 5\' 4"  (1.626 m)     Pain Score 05/07/18 1655 0   Constitutional: Alert and oriented. Well appearing and in no acute distress. Eyes: Conjunctivae are normal. PERRL. EOMI. Head: Atraumatic. Nose: No congestion/rhinnorhea. Mouth/Throat: Mucous membranes are moist.  Neck: No stridor.   Cardiovascular: Normal rate, regular rhythm. Good peripheral circulation. Grossly normal heart sounds.   Respiratory: Normal respiratory effort.  No retractions. Lungs CTAB. Gastrointestinal: Soft and nontender. No distention.  Musculoskeletal: No lower extremity tenderness nor edema. No gross deformities of extremities. Neurologic:  Normal speech and language. No gross focal neurologic deficits are appreciated. Normal CN exam 2-12. No pronator drift. Normal finger-to-nose bilaterally.  Skin:  Skin is warm, dry and intact. No rash noted.  ____________________________________________   LABS (all labs ordered are listed, but only abnormal results are displayed)  Labs Reviewed  COMPREHENSIVE METABOLIC PANEL - Abnormal; Notable for the following components:      Result Value   BUN 28 (*)    Creatinine, Ser 1.39 (*)    GFR calc non Af Amer 42 (*)    GFR calc Af Amer 49 (*)    All other components within normal limits  RAPID URINE DRUG SCREEN, HOSP PERFORMED - Abnormal; Notable for the following components:   Benzodiazepines POSITIVE (*)    All other components within normal limits  CBC WITH DIFFERENTIAL/PLATELET  ETHANOL  PROTIME-INR  APTT  TROPONIN I  URINALYSIS, ROUTINE W REFLEX MICROSCOPIC   ____________________________________________  EKG   EKG Interpretation  Date/Time:  Wednesday May 07 2018 17:02:51 EST Ventricular Rate:  84 PR Interval:  154 QRS Duration: 80 QT Interval:  344 QTC Calculation: 406 R  Axis:   9 Text Interpretation:  Normal sinus rhythm Minimal voltage criteria for LVH, may be normal variant Possible Anterior infarct , age undetermined Abnormal ECG No STEMI.  Confirmed by Nanda Quinton [phone]) on 05/07/2018 5:04:55 PM Also confirmed by Nanda Quinton [phone]), editor Hattie Perch ([id number])  on 05/08/2018 8:13:56 AM       ____________________________________________  RADIOLOGY  Ct Head Wo Contrast  Result Date: 05/07/2018 CLINICAL DATA:  Weakness, dizziness, LEFT facial tingling, LEFT leg weakness, fatigue for 2 days. EXAM: CT HEAD WITHOUT CONTRAST TECHNIQUE: Contiguous axial images were obtained from the base of the skull through the vertex without intravenous contrast. COMPARISON:  Head CT dated 10/09/2016. Brain MRI dated 10/09/2016. FINDINGS: Brain: Ventricles are within normal limits in size and configuration. There is no mass, hemorrhage, edema or other evidence of acute parenchymal abnormality. No extra-axial hemorrhage. Vascular: No hyperdense vessel or unexpected calcification. Skull: Normal. Negative for fracture or focal lesion. Sinuses/Orbits: No acute finding. Other: None. IMPRESSION: Negative head CT. No intracranial mass, hemorrhage or edema. Electronically Signed  By: Franki Cabot M.D.   On: 05/07/2018 19:01    ____________________________________________   PROCEDURES  Procedure(s) performed:   Procedures  None ____________________________________________   INITIAL IMPRESSION / ASSESSMENT AND PLAN / ED COURSE  Pertinent labs & imaging results that were available during my care of the patient were reviewed by me and considered in my medical decision making (see chart for details).  Patient with left face/arm numbness > 24 hours prior with no return of symptoms. Duration of symptoms was brief. Normal exam at this time. Plan for labs, EKG, and CT head. No MRI available at this location.   07:50 PM Spoke with Tele Neurology Dr. Criss Rosales. ABCD2 score  of 1. Patient good candidate for outpatient follow up with Neurology. Will place ambulatory referral. Labs and imaging reviewed in detail. Had  discussion with patient regarding findings, impression, and plan. Placed referral to Neurology but patient has seen Neuro in [place] in the past and may f/u with them. Discussed ED return precautions in detail.   ____________________________________________  FINAL CLINICAL IMPRESSION(S) / ED DIAGNOSES  Final diagnoses:  Numbness    Note:  This document was prepared using Dragon voice recognition software and may include unintentional dictation errors.  Nanda Quinton, MD Emergency Medicine    , [facility], MD 05/08/18 1030

## 2018-05-08 ENCOUNTER — Ambulatory Visit (INDEPENDENT_AMBULATORY_CARE_PROVIDER_SITE_OTHER): Payer: Medicare Other | Admitting: Family Medicine

## 2018-05-08 ENCOUNTER — Encounter: Payer: Self-pay | Admitting: Family Medicine

## 2018-05-08 ENCOUNTER — Telehealth: Payer: Self-pay | Admitting: Emergency Medicine

## 2018-05-08 ENCOUNTER — Other Ambulatory Visit: Payer: Self-pay

## 2018-05-08 VITALS — BP 120/88 | HR 83 | Temp 98.1°F | Ht 64.0 in | Wt 200.8 lb

## 2018-05-08 DIAGNOSIS — F1120 Opioid dependence, uncomplicated: Secondary | ICD-10-CM | POA: Diagnosis not present

## 2018-05-08 DIAGNOSIS — D899 Disorder involving the immune mechanism, unspecified: Secondary | ICD-10-CM

## 2018-05-08 DIAGNOSIS — Z79899 Other long term (current) drug therapy: Secondary | ICD-10-CM | POA: Diagnosis not present

## 2018-05-08 DIAGNOSIS — F5101 Primary insomnia: Secondary | ICD-10-CM

## 2018-05-08 DIAGNOSIS — R200 Anesthesia of skin: Secondary | ICD-10-CM | POA: Diagnosis not present

## 2018-05-08 DIAGNOSIS — Z944 Liver transplant status: Secondary | ICD-10-CM

## 2018-05-08 DIAGNOSIS — D849 Immunodeficiency, unspecified: Secondary | ICD-10-CM

## 2018-05-08 DIAGNOSIS — R202 Paresthesia of skin: Secondary | ICD-10-CM

## 2018-05-08 NOTE — Patient Instructions (Signed)
Please see the neurologist as instructed.  Go to ER for any recurring numbness or tingling. Restart your valium to prevent withdrawal symptoms.

## 2018-05-08 NOTE — Telephone Encounter (Signed)
FYI

## 2018-05-08 NOTE — Telephone Encounter (Signed)
Copied from [place] [phone]. Topic: General - Other >> May 08, 2018  2:29 PM Keene Breath wrote: Reason for CRM: Patient called to check the status of her refill for diazepam (VALIUM) 5 MG tablet.  She stated that the pharmacy told her that the orders is not there and she should check with the doctor.  Please advise and call patient when script has been sent.  CB# [phone]  Patient informed of Diazepam prescription at [place].

## 2018-05-08 NOTE — Progress Notes (Signed)
Subjective  CC:  Chief Complaint  Patient presents with  . Follow-up    ER Follow-UP, headache and tingling in legs and hands, last pm     HPI: Cassandra [name] is a 57 y.o. female who presents to the office today to address the problems listed above in the chief complaint. I've personally reviewed recent office visit notes, hospital notes, associated labs and imaging reports and/or pertinent outside office records via chart review or CareEverywhere. Briefly, ED eval for acute onset numbness tingling in left side of face and arm on dec 3rd. This started after she noted a frontal headache; felt flushed and lightheaded. Was driving at the time. Last for 20-30 minutes. She waited in the car. It dissipated on its own. She denied having cp or sob or palpitations. The next day she called [place] for an appt and was triaged to ER.    ER evaluated; pt was asymptomatic and had nl brain CT. Neurology recommended outpt f/u. A referral was placed.  Pt remains asymptomatic. She reports she hadn't taken her valium in 4 days for unclear reasons. She wonders if that caused her sxs. She has been on chronic benzo's for years. We are working on weaning them.   She admits to increased stress due to a sick sister and daughter but denies sxs of panic.   Chronic benzos: reviewed drug data base. In October, had both valium and xanax. In November just valium as prescribed. Verified with pharmacy. Has one refill as ordered.  Assessment  1. Numbness and tingling of left side of face   2. Numbness and tingling in left arm   3. Chronic narcotic dependence ([place])   4. Chronic prescription benzodiazepine use   5. Primary insomnia   6. Immunosuppressed status ([place])   7. History of liver transplant [facility])      Plan   ? TIA vs other:  Agree with need for further evaluation: will defer to neuro. Education given triage to ER if any recurring sxs.   Doubt related to benzo but told to restart. Will work on slow wean over  next 3-6 months although pt is hesitant. Uses for sleep. Has failed multiple medications for fibromyalgia pain.    Follow up: prn   No orders of the defined types were placed in this encounter.  No orders of the defined types were placed in this encounter.     I reviewed the patients updated PMH, FH, and SocHx.    Patient Active Problem List   Diagnosis Date Noted  . Immunosuppressed status ([place]) 10/07/2017    Priority: High  . Adenomatous polyp of colon 09/28/2017    Priority: High  . Chronic narcotic dependence ([place]) 11/25/2013    Priority: High  . Mixed hyperlipidemia 03/10/2013    Priority: High  . Primary osteoarthritis of right knee 03/01/2013    Priority: High  . Chronic back pain 10/13/2012    Priority: High  . Impaired glucose tolerance 12/13/2011    Priority: High  . History of liver transplant ([place]) 02/22/2009    Priority: High  . Cervical stenosis of spine 01/25/2017    Priority: Medium  . Fibromyalgia 08/24/2013    Priority: Medium  . Hx of tuberculosis 10/13/2012    Priority: Medium  . Anxiety disorder 09/16/2007    Priority: Medium  . Lumbar facet arthropathy 12/27/2017  . Primary insomnia 11/04/2017  . Preventative health care 12/06/2011   Current Meds  Medication Sig  . diazepam (VALIUM) 5 MG  tablet Take 1 tablet (5 mg total) by mouth at bedtime.  . mycophenolate (CELLCEPT) 500 MG tablet Take 500 mg by mouth 2 (two) times daily.  Marland Kitchen oxyCODONE-acetaminophen (PERCOCET) 10-325 MG tablet Take by mouth every 4 (four) hours as needed for pain.  . PredniSONE (DELTASONE PO) Take 1 tablet by mouth. Prednisone just reduced from 5mg  to 4 mg  . tacrolimus (PROGRAF) 1 MG capsule Take 1 capsule (1 mg total) by mouth 2 (two) times daily.    Allergies: Patient is allergic to erythromycin; ezetimibe; other; rofecoxib; statins; zonisamide; and nsaids. Family History: Patient family history includes Alzheimer's disease in her mother; Dementia in her mother;  Diabetes in her brother and mother; Gout in her mother; Heart disease in her sister; Hypertension in her mother and sister. Social History:  Patient  reports that she has never smoked. She has never used smokeless tobacco. She reports that she does not drink alcohol or use drugs.  Review of Systems: Constitutional: Negative for fever malaise or anorexia Cardiovascular: negative for chest pain Respiratory: negative for SOB or persistent cough Gastrointestinal: negative for abdominal pain  Objective  Vitals: BP 120/88   Pulse 83   Temp 98.1 F (36.7 C)   Ht 5\' 4"  (1.626 m)   Wt 200 lb 12.8 oz (91.1 kg)   SpO2 97%   BMI 34.47 kg/m  General: no acute distress , A&Ox3, appears well HEENT: PEERL, conjunctiva normal, Oropharynx moist,neck is supple Cardiovascular:  RRR without murmur or gallop.  Respiratory:  Good breath sounds bilaterally, CTAB with normal respiratory effort Skin:  Warm, no rashes Neuro, nl cranial nerves, nl sensation, nonfocal exam  No visits with results within 1 Day(s) from this visit.  Latest known visit with results is:  Admission on 05/07/2018, Discharged on 05/07/2018  Component Date Value Ref Range Status  . WBC 05/07/2018 6.7  4.0 - 10.5 K/uL Final  . RBC 05/07/2018 4.65  3.87 - 5.11 MIL/uL Final  . Hemoglobin 05/07/2018 13.3  12.0 - 15.0 g/dL Final  . HCT 05/07/2018 40.7  36.0 - 46.0 % Final  . MCV 05/07/2018 87.5  80.0 - 100.0 fL Final  . MCH 05/07/2018 28.6  26.0 - 34.0 pg Final  . MCHC 05/07/2018 32.7  30.0 - 36.0 g/dL Final  . RDW 05/07/2018 13.2  11.5 - 15.5 % Final  . Platelets 05/07/2018 183  150 - 400 K/uL Final  . nRBC 05/07/2018 0.0  0.0 - 0.2 % Final  . Neutrophils Relative % 05/07/2018 57  % Final  . Neutro Abs 05/07/2018 3.8  1.7 - 7.7 K/uL Final  . Lymphocytes Relative 05/07/2018 29  % Final  . Lymphs Abs 05/07/2018 2.0  0.7 - 4.0 K/uL Final  . Monocytes Relative 05/07/2018 11  % Final  . Monocytes Absolute 05/07/2018 0.7  0.1 - 1.0  K/uL Final  . Eosinophils Relative 05/07/2018 3  % Final  . Eosinophils Absolute 05/07/2018 0.2  0.0 - 0.5 K/uL Final  . Basophils Relative 05/07/2018 0  % Final  . Basophils Absolute 05/07/2018 0.0  0.0 - 0.1 K/uL Final  . Immature Granulocytes 05/07/2018 0  % Final  . Abs Immature Granulocytes 05/07/2018 0.03  0.00 - 0.07 K/uL Final  . Sodium 05/07/2018 139  135 - 145 mmol/L Final  . Potassium 05/07/2018 4.2  3.5 - 5.1 mmol/L Final  . Chloride 05/07/2018 107  98 - 111 mmol/L Final  . CO2 05/07/2018 24  22 - 32 mmol/L Final  . Glucose,  Bld 05/07/2018 93  70 - 99 mg/dL Final  . BUN 05/07/2018 28* 6 - 20 mg/dL Final  . Creatinine, Ser 05/07/2018 1.39* 0.44 - 1.00 mg/dL Final  . Calcium 05/07/2018 9.7  8.9 - 10.3 mg/dL Final  . Total Protein 05/07/2018 7.1  6.5 - 8.1 g/dL Final  . Albumin 05/07/2018 4.1  3.5 - 5.0 g/dL Final  . AST 05/07/2018 27  15 - 41 U/L Final  . ALT 05/07/2018 23  0 - 44 U/L Final  . Alkaline Phosphatase 05/07/2018 78  38 - 126 U/L Final  . Total Bilirubin 05/07/2018 0.5  0.3 - 1.2 mg/dL Final  . GFR calc non Af Amer 05/07/2018 42* >60 mL/min Final  . GFR calc Af Amer 05/07/2018 49* >60 mL/min Final  . Anion gap 05/07/2018 8  5 - 15 Final  . Alcohol, Ethyl (B) 05/07/2018 <10  <10 mg/dL Final  . Prothrombin Time 05/07/2018 13.4  11.4 - 15.2 seconds Final  . INR 05/07/2018 1.02   Final  . aPTT 05/07/2018 26  24 - 36 seconds Final  . Troponin I 05/07/2018 <0.03  <0.03 ng/mL Final  . Opiates 05/07/2018 NONE DETECTED  NONE DETECTED Final  . Cocaine 05/07/2018 NONE DETECTED  NONE DETECTED Final  . Benzodiazepines 05/07/2018 POSITIVE* NONE DETECTED Final  . Amphetamines 05/07/2018 NONE DETECTED  NONE DETECTED Final  . Tetrahydrocannabinol 05/07/2018 NONE DETECTED  NONE DETECTED Final  . Barbiturates 05/07/2018 NONE DETECTED  NONE DETECTED Final  . Color, Urine 05/07/2018 YELLOW  YELLOW Final  . APPearance 05/07/2018 CLEAR  CLEAR Final  . Specific Gravity, Urine  05/07/2018 1.020  1.005 - 1.030 Final  . pH 05/07/2018 5.5  5.0 - 8.0 Final  . Glucose, UA 05/07/2018 NEGATIVE  NEGATIVE mg/dL Final  . Hgb urine dipstick 05/07/2018 NEGATIVE  NEGATIVE Final  . Bilirubin Urine 05/07/2018 NEGATIVE  NEGATIVE Final  . Ketones, ur 05/07/2018 NEGATIVE  NEGATIVE mg/dL Final  . Protein, ur 05/07/2018 NEGATIVE  NEGATIVE mg/dL Final  . Nitrite 05/07/2018 NEGATIVE  NEGATIVE Final  . Leukocytes, UA 05/07/2018 NEGATIVE  NEGATIVE Final   Unremarkable brain CT from yesterday.    Commons side effects, risks, benefits, and alternatives for medications and treatment plan prescribed today were discussed, and the patient expressed understanding of the given instructions. Patient is instructed to call or message via MyChart if he/she has any questions or concerns regarding our treatment plan. No barriers to understanding were identified. We discussed Red Flag symptoms and signs in detail. Patient expressed understanding regarding what to do in case of urgent or emergency type symptoms.   Medication list was reconciled, printed and provided to the patient in AVS. Patient instructions and summary information was reviewed with the patient as documented in the AVS. This note was prepared with assistance of Dragon voice recognition software. Occasional wrong-word or sound-a-like substitutions may have occurred due to the inherent limitations of voice recognition software

## 2018-05-12 ENCOUNTER — Telehealth: Payer: Self-pay

## 2018-05-12 ENCOUNTER — Ambulatory Visit: Payer: Self-pay

## 2018-05-12 NOTE — Telephone Encounter (Signed)
FYI patient has appointment tomorrow

## 2018-05-12 NOTE — Telephone Encounter (Signed)
Pt called into [facility] stating that she had started her prescription of Valium on 05/08/18. Stated that she has been experiencing her heart racing and " felt her heart was going to beat out of her chest." She informed the [facility] agent and me that her pulse was 74. Pt. Is scheduled for office visit 05/13/18.

## 2018-05-12 NOTE — Telephone Encounter (Signed)
Pt c/o fast heart rate after taking her Valium at night. Symptoms occur mainly at bedtime after she takes her Valium. Pt stated symptoms began last Thursday night and stated the fast heart rate last for a few minutes then goes away. Pt stated she feels it "occasionally." HR 74 during call with BP 102/77. Pt tapped out her heart beat  So NT could hear and no skipped beats noted.  Pt stated that she is "feeling lost." Pt stated that she is forgetful and feels like her concentration and memory is not as good as it was. Pt stated she was on the Valium before, but she was taken off due to having the same symptoms. Called office to see if needs appointment. Asked by [facility] to forward back to the office.  Reason for Disposition . [1] Palpitations AND [2] no improvement after using CARE ADVICE  Answer Assessment - Initial Assessment Questions 1. DESCRIPTION: "Please describe your heart rate or heart beat that you are having" (e.g., fast/slow, regular/irregular, skipped or extra beats, "palpitations")     Fast- feels has difficulty breathing when she has fast heart rate 2. ONSET: "When did it start?" (Minutes, hours or days)      Last Thursday night 3. DURATION: "How long does it last" (e.g., seconds, minutes, hours)     Few minutes 4. PATTERN "Does it come and go, or has it been constant since it started?"  "Does it get worse with exertion?"   "Are you feeling it now?"     Comes and goes,feeling it occasionally  5. TAP: "Using your hand, can you tap out what you are feeling on a chair or table in front of you, so that I can hear?" (Note: not all patients can do this)       No skipped beats - sounded fast HR 6. HEART RATE: "Can you tell me your heart rate?" "How many beats in 15 seconds?"  (Note: not all patients can do this)       Pt cannot do this- BP: 102/77     HR: 74  7. RECURRENT SYMPTOM: "Have you ever had this before?" If so, ask: "When was the last time?" and "What happened that time?"      Yes- pt  stated that she had to be taken off of the medication. -Pt cannot remember when that was 8. CAUSE: "What do you think is causing the palpitations?"     Valium 9. CARDIAC HISTORY: "Do you have any history of heart disease?" (e.g., heart attack, angina, bypass surgery, angioplasty, arrhythmia)      no 10. OTHER SYMPTOMS: "Do you have any other symptoms?" (e.g., dizziness, chest pain, sweating, difficulty breathing)       Feels like she is in a daze that comes and goes Feels lost- difficulty with concentration and memory 11. PREGNANCY: "Is there any chance you are pregnant?" "When was your last menstrual period?"       n/a  Protocols used: HEART RATE AND HEARTBEAT QUESTIONS-A-AH

## 2018-05-13 ENCOUNTER — Other Ambulatory Visit: Payer: Self-pay

## 2018-05-13 ENCOUNTER — Encounter: Payer: Self-pay | Admitting: Family Medicine

## 2018-05-13 ENCOUNTER — Ambulatory Visit (INDEPENDENT_AMBULATORY_CARE_PROVIDER_SITE_OTHER): Payer: Medicare Other | Admitting: Family Medicine

## 2018-05-13 VITALS — BP 130/84 | HR 92 | Temp 98.6°F | Ht 64.0 in | Wt 200.6 lb

## 2018-05-13 DIAGNOSIS — M1711 Unilateral primary osteoarthritis, right knee: Secondary | ICD-10-CM | POA: Diagnosis not present

## 2018-05-13 DIAGNOSIS — M797 Fibromyalgia: Secondary | ICD-10-CM | POA: Diagnosis not present

## 2018-05-13 DIAGNOSIS — F5101 Primary insomnia: Secondary | ICD-10-CM | POA: Diagnosis not present

## 2018-05-13 DIAGNOSIS — F419 Anxiety disorder, unspecified: Secondary | ICD-10-CM

## 2018-05-13 DIAGNOSIS — T887XXA Unspecified adverse effect of drug or medicament, initial encounter: Secondary | ICD-10-CM

## 2018-05-13 DIAGNOSIS — Z789 Other specified health status: Secondary | ICD-10-CM

## 2018-05-13 DIAGNOSIS — G720 Drug-induced myopathy: Secondary | ICD-10-CM | POA: Insufficient documentation

## 2018-05-13 HISTORY — DX: Other specified health status: Z78.9

## 2018-05-13 NOTE — Patient Instructions (Signed)
Please follow up if symptoms do not improve or as needed.   Wean off of valium: Take 2.5 mg every other night this week,  Then next week, take 2.5 on Tuesday and Friday.  Then stop.

## 2018-05-13 NOTE — Progress Notes (Signed)
Subjective  CC:  Chief Complaint  Patient presents with  . Follow-up    Diazapam, she states that she feels worse while she is taking this medication     HPI: Cassandra [name] is a 57 y.o. female who presents to the office today to address the problems listed above in the chief complaint.  Cassandra [name] was recently here for follow-up of possible TIA.  She has not had further paresis or paresthesias.  In discussion, she noted that she had been off of her benzodiazepine which she had used chronically for several days and was told to restart it.  Since restarting the Valium she reports mental status changes including poor memory and lightheadedness.  She reports that she had been placed on Valium multiple years ago for insomnia by Dr. Maurene Capes and had similar complications.  She had not remembered this until now.  Prior to this she had been on chronic Xanax for sleep and anxiety.  We had weaned that.  She reports her anxiety and mood are good.  No current symptoms of depression or panic.  She has failed multiple SSRIs in the past to treat anxiety.  She is failed multiple sleep medications for insomnia.  She is failed multiple pain adjuvant medications for chronic pain.  She takes Percocet daily for chronic back pain and knee pain.  Complains of right knee pain, with swelling.  Last knee effusion was drained about 3 to 4 months ago.  She has not seen an orthopedist in a while.  She has been to [facility] and to [facility] in the past.  No fevers.  No stiffness.  No trauma. Assessment  1. Anxiety   2. Fibromyalgia   3. Primary osteoarthritis of right knee   4. Primary insomnia   5. Medication side effect      Plan   Anxiety and insomnia: Disc asked how she is failed multiple medications.  Currently symptoms are pretty well controlled.  Wean off benzodiazepines.  No medications for either at this point.  Work on [employer].  If anxiety or mood become a problem, can refer to psychiatry.   Patient understands and agrees with this care plan.  Dizziness and memory changes related to long-acting benzo.  Wean as stated above.  Primary osteoarthritis of right knee: Rec follow-up [facility]  Defer aspiration at this time.  She is a high infectious risk due to her immunosuppression after liver transplant.  Follow up: Return in about 3 months (around 08/12/2018) for complete physical.   No orders of the defined types were placed in this encounter.  No orders of the defined types were placed in this encounter.     I reviewed the patients updated PMH, FH, and SocHx.    Patient Active Problem List   Diagnosis Date Noted  . Statin intolerance 05/13/2018    Priority: High  . Primary insomnia 11/04/2017    Priority: High  . Immunosuppressed status ([place]) 10/07/2017    Priority: High  . Adenomatous polyp of colon 09/28/2017    Priority: High  . Chronic narcotic dependence ([place]) 11/25/2013    Priority: High  . Mixed hyperlipidemia 03/10/2013    Priority: High  . Primary osteoarthritis of right knee 03/01/2013    Priority: High  . Chronic back pain 10/13/2012    Priority: High  . Impaired glucose tolerance 12/13/2011    Priority: High  . History of liver transplant ([name]) 02/22/2009    Priority: High  . Cervical  stenosis of spine 01/25/2017    Priority: Medium  . Fibromyalgia 08/24/2013    Priority: Medium  . Hx of tuberculosis 10/13/2012    Priority: Medium  . Anxiety disorder 09/16/2007    Priority: Medium  . Cervical facet joint syndrome 03/28/2018  . Spondylosis of cervical region without myelopathy or radiculopathy 03/28/2018  . Lumbar facet arthropathy 12/27/2017   Current Meds  Medication Sig  . mycophenolate (CELLCEPT) 500 MG tablet Take 500 mg by mouth 2 (two) times daily.  Marland Kitchen oxyCODONE-acetaminophen (PERCOCET) 10-325 MG tablet Take by mouth every 4 (four) hours as needed for pain.  . PredniSONE (DELTASONE PO) Take 1 tablet by mouth. Prednisone  just reduced from 5mg  to 4 mg  . tacrolimus (PROGRAF) 1 MG capsule Take 1 capsule (1 mg total) by mouth 2 (two) times daily.  . [DISCONTINUED] diazepam (VALIUM) 5 MG tablet Take 1 tablet (5 mg total) by mouth at bedtime.    Allergies: Patient is allergic to erythromycin; ezetimibe; other; rofecoxib; statins; zonisamide; and nsaids. Family History: Patient family history includes Alzheimer's disease in her mother; Dementia in her mother; Diabetes in her brother and mother; Gout in her mother; Heart disease in her sister; Hypertension in her mother and sister. Social History:  Patient  reports that she has never smoked. She has never used smokeless tobacco. She reports that she does not drink alcohol or use drugs.  Review of Systems: Constitutional: Negative for fever malaise or anorexia Cardiovascular: negative for chest pain Respiratory: negative for SOB or persistent cough Gastrointestinal: negative for abdominal pain  Objective  Vitals: BP 130/84   Pulse 92   Temp 98.6 F (37 C)   Ht 5\' 4"  (1.626 m)   Wt 200 lb 9.6 oz (91 kg)   SpO2 98%   BMI 34.43 kg/m  General: no acute distress , A&Ox3, appears well, pleasant Right knee: Mild effusion, without redness or warmth.   Skin:  Warm, no rashes     Commons side effects, risks, benefits, and alternatives for medications and treatment plan prescribed today were discussed, and the patient expressed understanding of the given instructions. Patient is instructed to call or message via MyChart if he/she has any questions or concerns regarding our treatment plan. No barriers to understanding were identified. We discussed Red Flag symptoms and signs in detail. Patient expressed understanding regarding what to do in case of urgent or emergency type symptoms.   Medication list was reconciled, printed and provided to the patient in AVS. Patient instructions and summary information was reviewed with the patient as documented in the AVS. This  note was prepared with assistance of Dragon voice recognition software. Occasional wrong-word or sound-a-like substitutions may have occurred due to the inherent limitations of voice recognition software

## 2018-05-20 LAB — HM MAMMOGRAPHY

## 2018-05-20 IMAGING — MR MR CERVICAL SPINE W/O CM
4 of 5 series · 19 of 48 positions shown · non-contrast
Comparison: None.

CLINICAL DATA: Right paresthesias

EXAM:
MRI CERVICAL SPINE WITHOUT CONTRAST
TECHNIQUE: Multiplanar, multisequence MR imaging of the cervical spine was
performed. No intravenous contrast was administered.

[Series 3: T1 · sagittal · 3.0mm · 0.41mm/px · 3 of 15 slices shown]
[im 3/15]
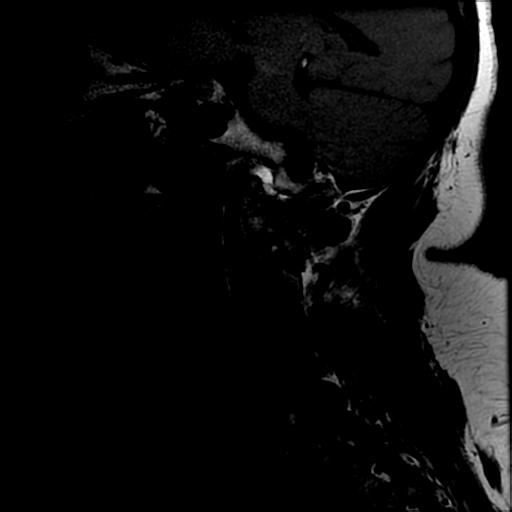
[im 9/15]
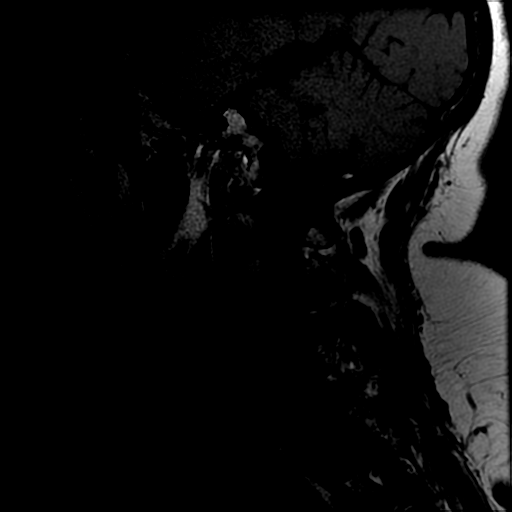
[im 15/15]
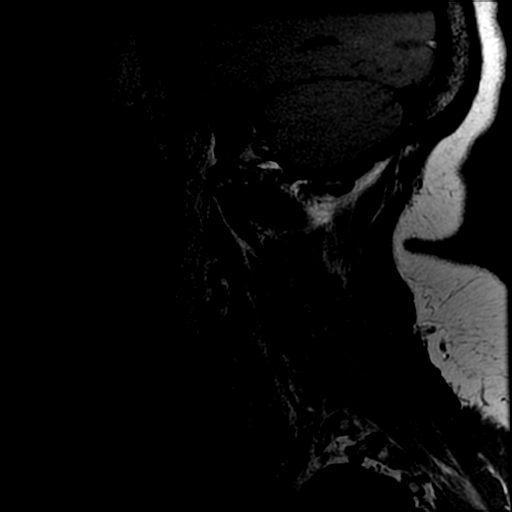

[Series 4: sag ir · sagittal · 3.0mm · 0.41mm/px · 3 of 15 slices shown]
[im 3/15]
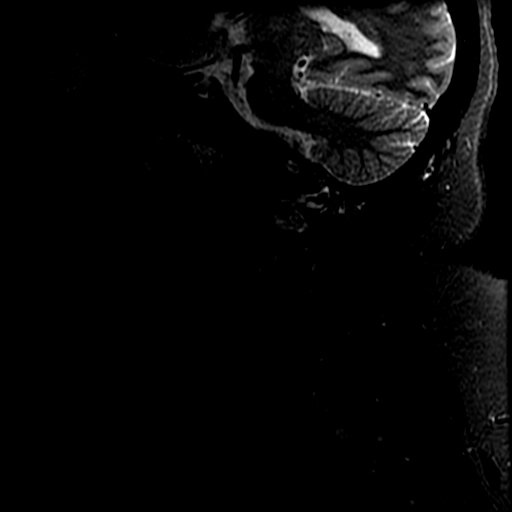
[im 8/15]
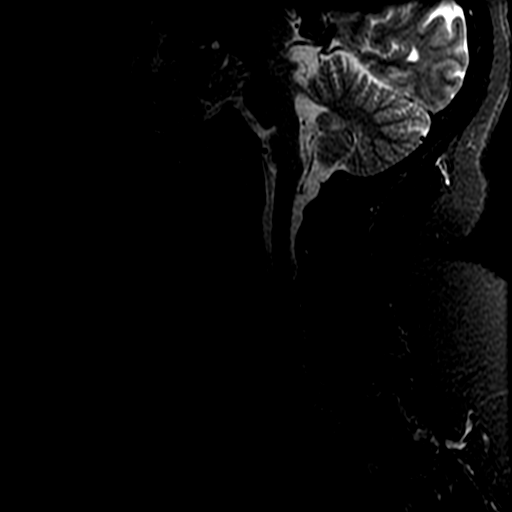
[im 12/15]
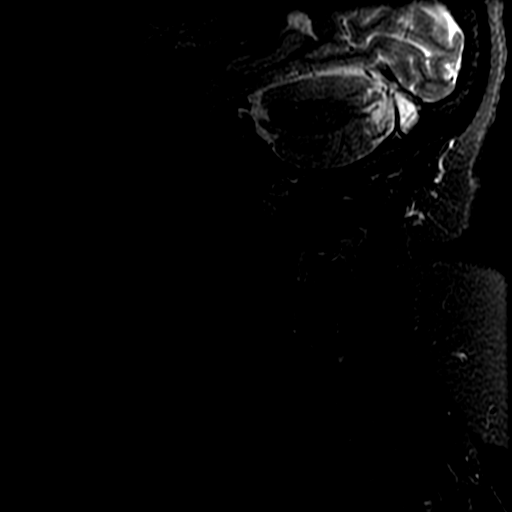

[Series 5: T2 · sagittal · 3.0mm · 0.41mm/px · 7 of 15 slices shown (1 of 2)]
[im 1/15]
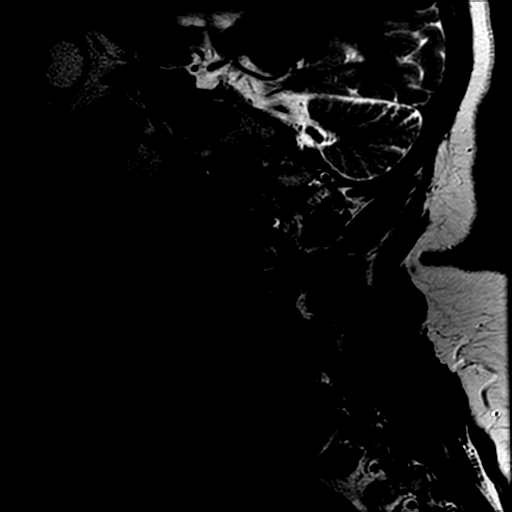
[im 3/15]
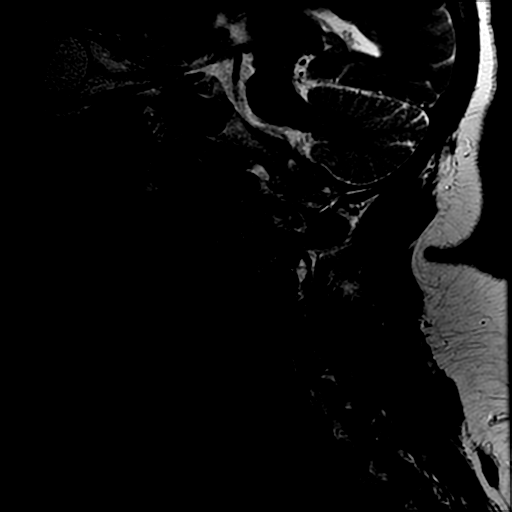
[im 5/15]
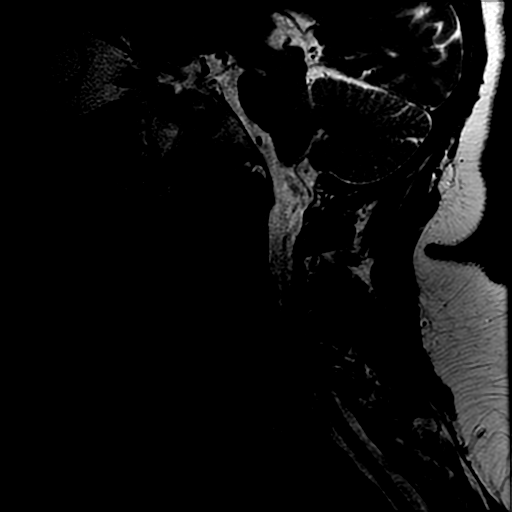
[im 8/15]
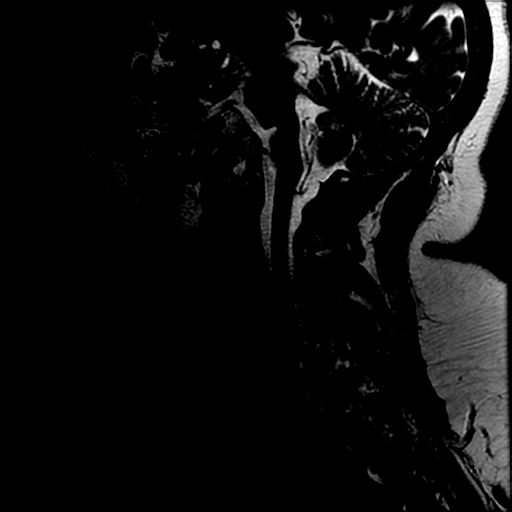
[im 10/15]
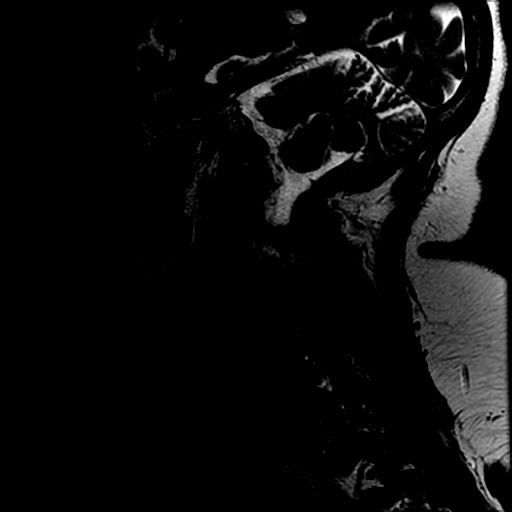
[im 12/15]
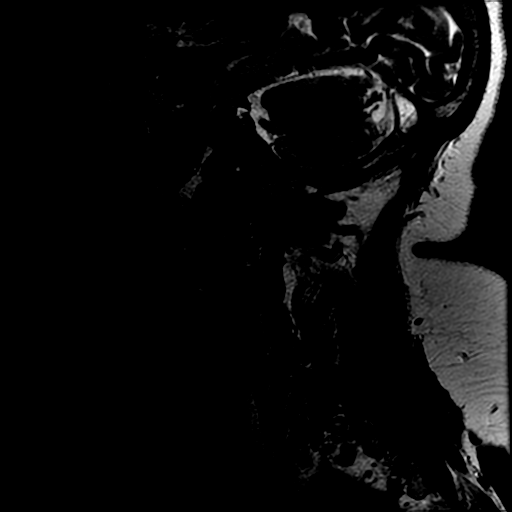
[im 15/15]
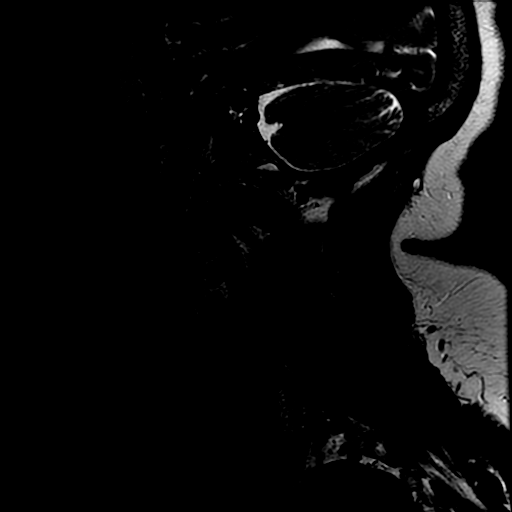

[Series 7: T2 · axial · 3.1mm · 0.35mm/px · z∈[-31,+48]mm · 6 of 29 slices shown (2 of 2)]
[im 1/29]
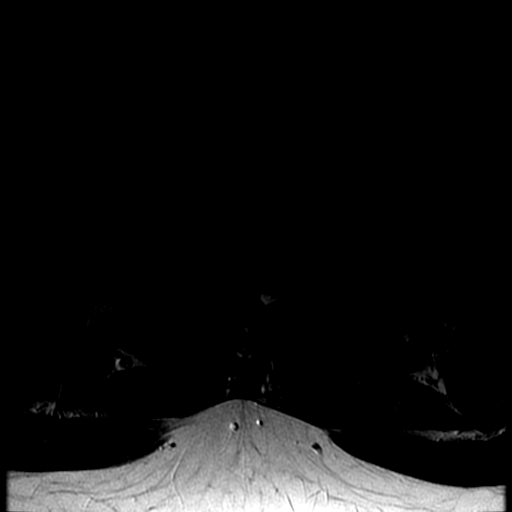
[im 5/29]
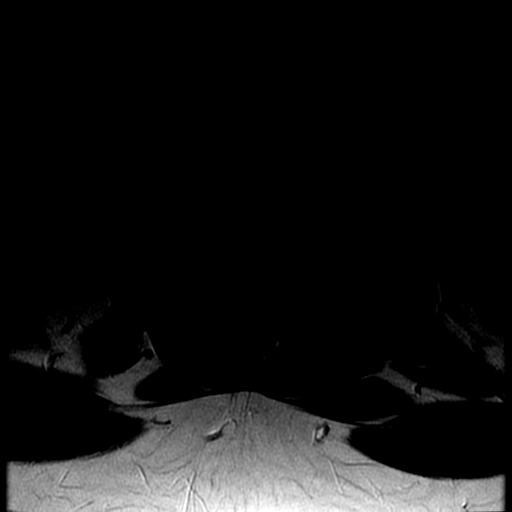
[im 9/29]
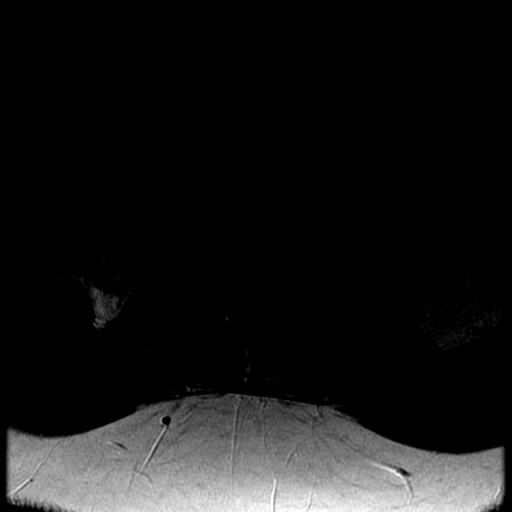
[im 13/29]
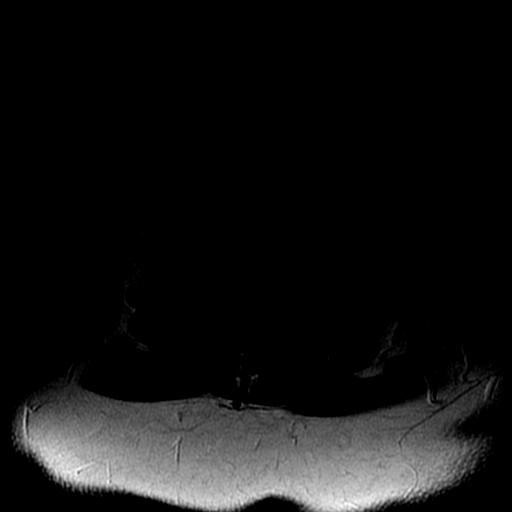
[im 16/29]
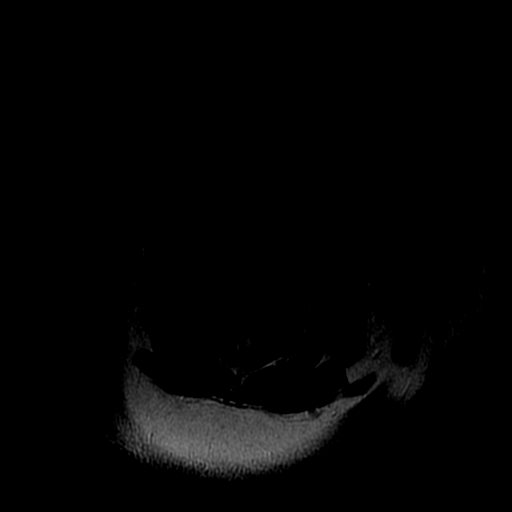
[im 24/29]
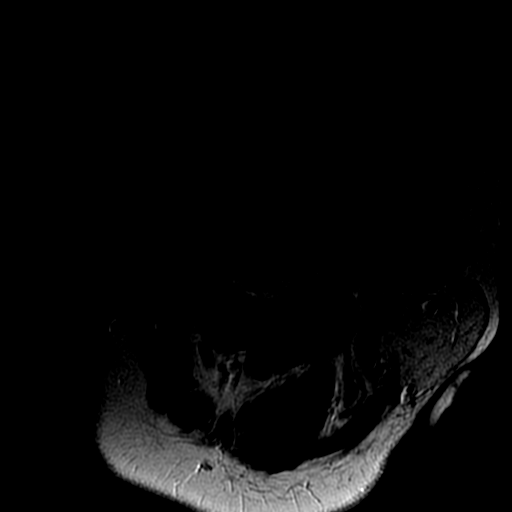

[19 of 48 positions shown; findings below may reference images not displayed]

FINDINGS: Alignment: Normal

Vertebrae: No acute compression fracture, discitis-osteomyelitis,
facet edema or other focal marrow lesion. No epidural collection.

Cord: Normal caliber and signal

Posterior Fossa, vertebral arteries, paraspinal tissues: Visualized
posterior fossa is normal. Vertebral artery flow voids are
preserved. Normal visualized paraspinal soft tissues.

Disc levels:

C1-C2: Normal.

C2-C3: Normal disc space and facets. No spinal canal or
neuroforaminal stenosis.

C3-C4: There is a central disc extrusion with inferior migration.
There is mild narrowing of the spinal canal. No nerve root
impingement.

C4-C5: Small disc bulge without spinal canal stenosis or neural
foraminal narrowing.

C5-C6: There is a large disc protrusion, greatest in the right
subarticular location. This there is moderate narrowing of the
spinal canal. There is moderate to severe right and mild left neural
foraminal stenosis.

C6-C7: Medium-sized central disc protrusion with mild narrowing of
the spinal canal. Severe narrowing of the left neural foramen.

C7-T1: Small disc bulge.  Mild narrowing of the neural foramina.
IMPRESSION: 1. The most likely symptomatic level is C5-C6 where a right
subarticular predominant disc protrusion causes severe right neural
foraminal narrowing.
2. Severe left neural foraminal narrowing at C6-C7.
3. Multilevel mild spinal canal stenosis without cord compression or
signal change.

## 2018-05-22 ENCOUNTER — Encounter: Payer: Self-pay | Admitting: Emergency Medicine

## 2018-06-09 ENCOUNTER — Ambulatory Visit: Payer: Medicare Other | Admitting: Family Medicine

## 2018-06-19 ENCOUNTER — Other Ambulatory Visit (INDEPENDENT_AMBULATORY_CARE_PROVIDER_SITE_OTHER): Payer: Medicare Other

## 2018-06-19 DIAGNOSIS — Z944 Liver transplant status: Secondary | ICD-10-CM

## 2018-06-19 DIAGNOSIS — K754 Autoimmune hepatitis: Secondary | ICD-10-CM

## 2018-06-19 LAB — COMPREHENSIVE METABOLIC PANEL
ALT: 28 U/L (ref 0–35)
AST: 16 U/L (ref 0–37)
Albumin: 4.3 g/dL (ref 3.5–5.2)
Alkaline Phosphatase: 106 U/L (ref 39–117)
BUN: 22 mg/dL (ref 6–23)
CALCIUM: 9.6 mg/dL (ref 8.4–10.5)
CO2: 23 mEq/L (ref 19–32)
Chloride: 104 mEq/L (ref 96–112)
Creatinine, Ser: 1.27 mg/dL — ABNORMAL HIGH (ref 0.40–1.20)
GFR: 55.56 mL/min — ABNORMAL LOW (ref >60.00)
Glucose, Bld: 106 mg/dL — ABNORMAL HIGH (ref 70–99)
Potassium: 3.7 mEq/L (ref 3.5–5.1)
Sodium: 138 mEq/L (ref 135–145)
Total Bilirubin: 0.5 mg/dL (ref 0.2–1.2)
Total Protein: 7.3 g/dL (ref 6.0–8.3)

## 2018-06-19 LAB — CBC WITH DIFFERENTIAL/PLATELET
BASOS ABS: 0.1 10*3/uL (ref 0.0–0.1)
Basophils Relative: 1.3 % (ref 0.0–3.0)
Eosinophils Absolute: 0.3 10*3/uL (ref 0.0–0.7)
Eosinophils Relative: 4.1 % (ref 0.0–5.0)
HCT: 42.5 % (ref 36.0–46.0)
Hemoglobin: 14.3 g/dL (ref 12.0–15.0)
LYMPHS PCT: 36.1 % (ref 12.0–46.0)
Lymphs Abs: 2.2 10*3/uL (ref 0.7–4.0)
MCHC: 33.7 g/dL (ref 30.0–36.0)
MCV: 87.2 fl (ref 78.0–100.0)
Monocytes Absolute: 0.7 10*3/uL (ref 0.1–1.0)
Monocytes Relative: 11.8 % (ref 3.0–12.0)
NEUTROS ABS: 2.9 10*3/uL (ref 1.4–7.7)
NEUTROS PCT: 46.7 % (ref 43.0–77.0)
Platelets: 196 10*3/uL (ref 150.0–400.0)
RBC: 4.87 Mil/uL (ref 3.87–5.11)
RDW: 13.5 % (ref 11.5–15.5)
WBC: 6.1 10*3/uL (ref 4.0–10.5)

## 2018-06-20 ENCOUNTER — Telehealth: Payer: Self-pay | Admitting: Family Medicine

## 2018-06-20 LAB — TIQ-NTM

## 2018-06-20 LAB — TACROLIMUS LEVEL: Tacrolimus (FK506), Blood: 6.3 ng/mL

## 2018-06-20 NOTE — Telephone Encounter (Signed)
Pt dropped of a Physician's Verification form from [employer]. I have placed them in the bin up front with a charge sheet. Pt would like these forms faxed to [phone] when completed.

## 2018-06-23 NOTE — Telephone Encounter (Signed)
Pt aware a copy has been faxed and mailed to [employer] and that a copy is on-file in her chart.

## 2018-06-29 ENCOUNTER — Encounter (HOSPITAL_BASED_OUTPATIENT_CLINIC_OR_DEPARTMENT_OTHER): Payer: Self-pay | Admitting: Emergency Medicine

## 2018-06-29 ENCOUNTER — Emergency Department (HOSPITAL_BASED_OUTPATIENT_CLINIC_OR_DEPARTMENT_OTHER)
Admission: EM | Admit: 2018-06-29 | Discharge: 2018-06-29 | Disposition: A | Discharge status: Home / Self Care | Payer: Medicare Other | Attending: Emergency Medicine | Admitting: Emergency Medicine

## 2018-06-29 ENCOUNTER — Other Ambulatory Visit: Payer: Self-pay

## 2018-06-29 DIAGNOSIS — Z79899 Other long term (current) drug therapy: Secondary | ICD-10-CM | POA: Diagnosis not present

## 2018-06-29 DIAGNOSIS — M25511 Pain in right shoulder: Secondary | ICD-10-CM | POA: Diagnosis present

## 2018-06-29 DIAGNOSIS — X509XXA Other and unspecified overexertion or strenuous movements or postures, initial encounter: Secondary | ICD-10-CM | POA: Insufficient documentation

## 2018-06-29 MED ORDER — KETOROLAC TROMETHAMINE 30 MG/ML IJ SOLN
15.0000 mg | Freq: Once | INTRAMUSCULAR | Status: AC
  Administered 2018-06-29: 15 mg via INTRAMUSCULAR
  Filled 2018-06-29: qty 1

## 2018-06-29 MED ORDER — LIDOCAINE 5 % EX PTCH: 1.0000 | MEDICATED_PATCH | CUTANEOUS | 0 refills | Status: DC

## 2018-06-29 NOTE — ED Triage Notes (Signed)
Patient states that she has had right shouler pain down her right arm x 2 -3 days  - the patient reports that it hurts worse with movement and its " just nagging"

## 2018-06-29 NOTE — ED Provider Notes (Signed)
[facility] EMERGENCY DEPARTMENT Provider Note   CSN: [id number] Arrival date & time: 06/29/18  1201     History   Chief Complaint Chief Complaint  Patient presents with  . Shoulder Pain    HPI Cassandra [name] is a 58 y.o. female with a PMH of Liver Transplant, Autoimmune Hepatitis, Cirrhosis, Anxiety, Fibromyalgia, and Depression presenting with right posterior shoulder pain that radiates to her elbow onset 2-3 days ago. Patient states pain is worse with movement and better with rest. Patient describes pain as intermittent and states it is an ache. Patient reports she has taken percocet with relief. Patient states she is prescribed percocet for chronic back pain. Patient states she sees pain management. Patient states she has also taken aleve with some relief. Patient states she has been packing around the house because she is moving. Patient denies any known injuries. Patient states she had a rotator cuff tear on her left shoulder a few years ago. Patient denies any surgery on her right shoulder. Patient denies edema, numbness, paresthesias, weakness, fever, chills, nausea, vomiting, or abdominal pain. Patient states she has been taking her medications as prescribed and follows up with her Transplant team.   HPI  Past Medical History:  Diagnosis Date  . Anxiety   . Autoimmune hepatitis ([place])   . Chronic renal insufficiency   . Cirrhosis ([place])   . COLONIC POLYPS, ADENOMATOUS, HX OF 05/05/2008   Qualifier: Diagnosis of  By: [name], [name]    . Depression   . External hemorrhoids   . Fibromyalgia   . GERD (gastroesophageal reflux disease)   . Hyperlipidemia   . Internal hemorrhoids   . Iron deficiency anemia   . Liver replaced by transplant ([place]) 02/22/2009   Qualifier: Diagnosis of  By: Chester Holstein NP, Nevin Bloodgood    . 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 Statin intolerance 05/13/2018    Patient Active Problem List   Diagnosis Date Noted  . Statin  intolerance 05/13/2018  . Cervical facet joint syndrome 03/28/2018  . Spondylosis of cervical region without myelopathy or radiculopathy 03/28/2018  . Lumbar facet arthropathy 12/27/2017  . Primary insomnia 11/04/2017  . Immunosuppressed status ([place]) 10/07/2017  . Adenomatous polyp of colon 09/28/2017  . Cervical stenosis of spine 01/25/2017  . Chronic narcotic dependence ([place]) 11/25/2013  . Fibromyalgia 08/24/2013  . Mixed hyperlipidemia 03/10/2013  . Primary osteoarthritis of right knee 03/01/2013  . Chronic back pain 10/13/2012  . Hx of tuberculosis 10/13/2012  . Impaired glucose tolerance 12/13/2011  . History of liver transplant ([place]) 02/22/2009  . Anxiety disorder 09/16/2007    Past Surgical History:  Procedure Laterality Date  . ABDOMINAL HYSTERECTOMY    . KNEE ARTHROSCOPY    . LIVER TRANSPLANT  1992  . ROTATOR CUFF REPAIR       OB History   No obstetric history on file.      Home Medications    Prior to Admission medications   Medication Sig Start Date End Date Taking? Authorizing Provider  lidocaine (LIDODERM) 5 % Place 1 patch onto the skin daily. Remove & Discard patch within 12 hours or as directed by MD 06/29/18   Darlin Drop P, PA-C  mycophenolate (CELLCEPT) 500 MG tablet Take 500 mg by mouth 2 (two) times daily.    [provider]  oxyCODONE-acetaminophen (PERCOCET) 10-325 MG tablet Take by mouth every 4 (four) hours as needed for pain.    [provider]  PredniSONE (DELTASONE PO) Take  1 tablet by mouth. Prednisone just reduced from 5mg  to 4 mg    [provider]  tacrolimus (PROGRAF) 1 MG capsule Take 1 capsule (1 mg total) by mouth 2 (two) times daily. 01/03/15   [place] Dragon, MD    Family History Family History  Problem Relation Age of Onset  . Diabetes Brother   . Heart disease Sister   . Hypertension Sister   . Diabetes Mother   . Dementia Mother   . Alzheimer's disease Mother   . Gout Mother   . Hypertension  Mother   . Colon cancer Neg Hx     Social History Social History   Tobacco Use  . Smoking status: Never Smoker  . Smokeless tobacco: Never Used  Substance Use Topics  . Alcohol use: No    Alcohol/week: 0.0 standard drinks  . Drug use: No     Allergies   Erythromycin; Ezetimibe; Other; Rofecoxib; Statins; Zonisamide; and Nsaids   Review of Systems Review of Systems  Constitutional: Negative for chills, diaphoresis and fever.  HENT: Negative for congestion and rhinorrhea.   Respiratory: Negative for cough and shortness of breath.   Cardiovascular: Negative for chest pain.  Gastrointestinal: Negative for abdominal pain, nausea and vomiting.  Endocrine: Negative for cold intolerance and heat intolerance.  Genitourinary: Negative for dysuria.  Musculoskeletal: Positive for arthralgias, back pain (Pt reports chronic back pain, but denies any acute changes.) and myalgias. Negative for gait problem, joint swelling, neck pain and neck stiffness.  Skin: Negative for color change, rash and wound.  Allergic/Immunologic: Positive for immunocompromised state.  Neurological: Negative for weakness and numbness.  Hematological: Negative for adenopathy.    Physical Exam Updated Vital Signs BP (!) 152/90 (BP Location: Left Arm)   Pulse 83   Temp 98.1 F (36.7 C) (Oral)   Resp 18   Ht 5\' 4"  (1.626 m)   Wt 83.9 kg   SpO2 100%   BMI 31.76 kg/m   Physical Exam Vitals signs and nursing note reviewed.  Constitutional:      General: She is not in acute distress.    Appearance: She is well-developed. She is not diaphoretic.  HENT:     Head: Normocephalic and atraumatic.  Neck:     Musculoskeletal: Normal range of motion and neck supple.  Cardiovascular:     Rate and Rhythm: Normal rate and regular rhythm.     Heart sounds: Normal heart sounds. No murmur. No friction rub. No gallop.   Pulmonary:     Effort: Pulmonary effort is normal. No respiratory distress.     Breath sounds:  Normal breath sounds.  Musculoskeletal:        General: No deformity.     Right shoulder: She exhibits decreased range of motion and tenderness. She exhibits no bony tenderness, no swelling, no effusion, no crepitus, no deformity, no laceration, no pain, no spasm, normal pulse and normal strength.     Left shoulder: Normal. She exhibits normal range of motion, no tenderness and no bony tenderness.     Right elbow: Normal.She exhibits normal range of motion, no swelling, no effusion and no deformity.     Cervical back: She exhibits tenderness. She exhibits normal range of motion, no bony tenderness, no swelling and no edema.     Thoracic back: Normal. She exhibits normal range of motion, no tenderness and no bony tenderness.     Lumbar back: Normal. She exhibits normal range of motion, no tenderness, no bony tenderness and  no swelling.     Comments: No skin changes noted. Decreased ROM with internal and external rotation. Full ROM with flexion and extension. Mild tenderness to palpation of posterior shoulder and posterior upper arm. No skin changes or tenderness to palpation of right elbow.  Full ROM of right elbow. Mild tenderness to palpation of right paraspinal muscles of cervical spine. No midline tenderness of cervical, thoracic, or lumbar spine. Full ROM of cervical, thoracic, and lumbar spine. 2+ radial pulses bilaterally. Sensation intact. Negative drop arm test, negative empty can test, negative Hawkin's test.   Skin:    General: Skin is warm.     Coloration: Skin is not pale.     Findings: No erythema or rash.  Neurological:     Mental Status: She is alert and oriented to person, place, and time.     ED Treatments / Results  Labs (all labs ordered are listed, but only abnormal results are displayed) Labs Reviewed - No data to display  EKG None  Radiology No results found.  Procedures Procedures (including critical care time)  Medications Ordered in ED Medications    ketorolac (TORADOL) 30 MG/ML injection 15 mg (15 mg Intramuscular Given 06/29/18 1255)    Initial Impression / Assessment and Plan / ED Course  I have reviewed the triage vital signs and the nursing notes.  Pertinent labs & imaging results that were available during my care of the patient were reviewed by me and considered in my medical decision making (see chart for details).    Patient presents with right shoulder pain. Suspect musculoskeletal in nature due to history and physical exam. Do not suspect imaging is required at this time based on history and exam. Pain managed in ED. Pt advised to follow up with orthopedics if symptoms. Prescribed lidoderm patch for symptoms. Discussed elevated BP with patient and advised patient to follow up with PCP. Conservative therapy recommended and discussed. Discussed returning to ER for new and worsening symptoms. Patient will be dc home & is agreeable with above plan.  Final Clinical Impressions(s) / ED Diagnoses   Final diagnoses:  Acute pain of right shoulder    ED Discharge Orders         Ordered    lidocaine (LIDODERM) 5 %  Every 24 hours     06/29/18 1314           Darlin Drop [place], [place] 06/29/18 1318    Virgel Manifold, MD 06/30/18 [phone]

## 2018-06-29 NOTE — Discharge Instructions (Addendum)
1. Medications: lidoderm patch for pain control, usual home medications 2. Treatment: rest, ice, drink plenty of fluids, gentle stretching 3. Follow Up: Please followup with orthopedics as directed or your PCP in 1 week if no improvement for discussion of your diagnoses and further evaluation after today's visit; if you do not have a primary care doctor use the resource guide provided to find one; Please return to the ER for worsening symptoms or other concerns

## 2018-07-03 ENCOUNTER — Encounter: Payer: Self-pay | Admitting: Neurology

## 2018-07-03 ENCOUNTER — Ambulatory Visit: Payer: Medicare Other | Admitting: Neurology

## 2018-07-03 ENCOUNTER — Encounter

## 2018-07-03 VITALS — BP 140/92 | HR 83 | Ht 64.0 in | Wt 198.0 lb

## 2018-07-03 DIAGNOSIS — G43109 Migraine with aura, not intractable, without status migrainosus: Secondary | ICD-10-CM | POA: Diagnosis not present

## 2018-07-03 DIAGNOSIS — R5383 Other fatigue: Secondary | ICD-10-CM | POA: Diagnosis not present

## 2018-07-03 MED ORDER — CYCLOBENZAPRINE HCL 10 MG PO TABS: 10.0000 mg | ORAL_TABLET | Freq: Every day | ORAL | 6 refills | Status: DC

## 2018-07-03 NOTE — Patient Instructions (Signed)
Flexeril 10mg  at bedtime   Migraine Headache A migraine headache is an intense, throbbing pain on one side or both sides of the head. Migraines may also cause other symptoms, such as nausea, vomiting, and sensitivity to light and noise. What are the causes? Doing or taking certain things may also trigger migraines, such as:  Alcohol.  Smoking.  Medicines, such as: ? Medicine used to treat chest pain (nitroglycerine). ? Birth control pills. ? Estrogen pills. ? Certain blood pressure medicines.  Aged cheeses, chocolate, or caffeine.  Foods or drinks that contain nitrates, glutamate, aspartame, or tyramine.  Physical activity. Other things that may trigger a migraine include:  Menstruation.  Pregnancy.  Hunger.  Stress, lack of sleep, too much sleep, or fatigue.  Weather changes. What increases the risk? The following factors may make you more likely to experience migraine headaches:  Age. Risk increases with age.  Family history of migraine headaches.  Being Caucasian.  Depression and anxiety.  Obesity.  Being a woman.  Having a hole in the heart (patent foramen ovale) or other heart problems. What are the signs or symptoms? The main symptom of this condition is pulsating or throbbing pain. Pain may:  Happen in any area of the head, such as on one side or both sides.  Interfere with daily activities.  Get worse with physical activity.  Get worse with exposure to bright lights or loud noises. Other symptoms may include:  Nausea.  Vomiting.  Dizziness.  General sensitivity to bright lights, loud noises, or smells. Before you get a migraine, you may get warning signs that a migraine is developing (aura). An aura may include:  Seeing flashing lights or having blind spots.  Seeing bright spots, halos, or zigzag lines.  Having tunnel vision or blurred vision.  Having numbness or a tingling feeling.  Having trouble talking.  Having muscle  weakness. How is this diagnosed? A migraine headache can be diagnosed based on:  Your symptoms.  A physical exam.  Tests, such as CT scan or MRI of the head. These imaging tests can help rule out other causes of headaches.  Taking fluid from the spine (lumbar puncture) and analyzing it (cerebrospinal fluid analysis, or CSF analysis). How is this treated? A migraine headache is usually treated with medicines that:  Relieve pain.  Relieve nausea.  Prevent migraines from coming back. Treatment may also include:  Acupuncture.  Lifestyle changes like avoiding foods that trigger migraines. Follow these instructions at home: Medicines  Take over-the-counter and prescription medicines only as told by your health care provider.  Do not drive or use heavy machinery while taking prescription pain medicine.  To prevent or treat constipation while you are taking prescription pain medicine, your health care provider may recommend that you: ? Drink enough fluid to keep your urine clear or pale yellow. ? Take over-the-counter or prescription medicines. ? Eat foods that are high in fiber, such as fresh fruits and vegetables, whole grains, and beans. ? Limit foods that are high in fat and processed sugars, such as fried and sweet foods. Lifestyle  Avoid alcohol use.  Do not use any products that contain nicotine or tobacco, such as cigarettes and e-cigarettes. If you need help quitting, ask your health care provider.  Get at least 8 hours of sleep every night.  Limit your stress. General instructions      Keep a journal to find out what may trigger your migraine headaches. For example, write down: ? What you eat and  drink. ? How much sleep you get. ? Any change to your diet or medicines.  If you have a migraine: ? Avoid things that make your symptoms worse, such as bright lights. ? It may help to lie down in a dark, quiet room. ? Do not drive or use heavy machinery. ? Ask  your health care provider what activities are safe for you while you are experiencing symptoms.  Keep all follow-up visits as told by your health care provider. This is important. Contact a health care provider if:  You develop symptoms that are different or more severe than your usual migraine symptoms. Get help right away if:  Your migraine becomes severe.  You have a fever.  You have a stiff neck.  You have vision loss.  Your muscles feel weak or like you cannot control them.  You start to lose your balance often.  You develop trouble walking.  You faint. This information is not intended to replace advice given to you by your health care provider. Make sure you discuss any questions you have with your health care provider. Document Released: 05/21/2005 Document Revised: 12/09/2015 Document Reviewed: 11/07/2015 Elsevier Interactive Patient Education  2019 Elsevier Inc.   Cyclobenzaprine tablets What is this medicine? CYCLOBENZAPRINE (sye kloe BEN za preen) is a muscle relaxer. It is used to treat muscle pain, spasms, and stiffness. This medicine may be used for other purposes; ask your health care provider or pharmacist if you have questions. COMMON BRAND NAME(S): Fexmid, Flexeril What should I tell my health care provider before I take this medicine? They need to know if you have any of these conditions: -heart disease, irregular heartbeat, or previous heart attack -liver disease -thyroid problem -an unusual or allergic reaction to cyclobenzaprine, tricyclic antidepressants, lactose, other medicines, foods, dyes, or preservatives -pregnant or trying to get pregnant -breast-feeding How should I use this medicine? Take this medicine by mouth with a glass of water. Follow the directions on the prescription label. If this medicine upsets your stomach, take it with food or milk. Take your medicine at regular intervals. Do not take it more often than directed. Talk to your  pediatrician regarding the use of this medicine in children. Special care may be needed. Overdosage: If you think you have taken too much of this medicine contact a poison control center or emergency room at once. NOTE: This medicine is only for you. Do not share this medicine with others. What if I miss a dose? If you miss a dose, take it as soon as you can. If it is almost time for your next dose, take only that dose. Do not take double or extra doses. What may interact with this medicine? Do not take this medicine with any of the following medications: -MAOIs like Carbex, Eldepryl, Marplan, Nardil, and Parnate This medicine may also interact with the following medications: -alcohol -antihistamines for allergy, cough, and cold -certain medicines for anxiety or sleep -certain medicines for depression like amitriptyline, fluoxetine, sertraline -certain medicines for seizures like phenobarbital, primidone -contrast dyes -local anesthetics like lidocaine, pramoxine, tetracaine -medicines that relax muscles for surgery -narcotic medicines for pain -phenothiazines like chlorpromazine, mesoridazine, prochlorperazine This list may not describe all possible interactions. Give your health care provider a list of all the medicines, herbs, non-prescription drugs, or dietary supplements you use. Also tell them if you smoke, drink alcohol, or use illegal drugs. Some items may interact with your medicine. What should I watch for while using this medicine? Tell your  doctor or health care professional if your symptoms do not start to get better or if they get worse. You may get drowsy or dizzy. Do not drive, use machinery, or do anything that needs mental alertness until you know how this medicine affects you. Do not stand or sit up quickly, especially if you are an older patient. This reduces the risk of dizzy or fainting spells. Alcohol may interfere with the effect of this medicine. Avoid alcoholic  drinks. If you are taking another medicine that also causes drowsiness, you may have more side effects. Give your health care provider a list of all medicines you use. Your doctor will tell you how much medicine to take. Do not take more medicine than directed. Call emergency for help if you have problems breathing or unusual sleepiness. Your mouth may get dry. Chewing sugarless gum or sucking hard candy, and drinking plenty of water may help. Contact your doctor if the problem does not go away or is severe. What side effects may I notice from receiving this medicine? Side effects that you should report to your doctor or health care professional as soon as possible: -allergic reactions like skin rash, itching or hives, swelling of the face, lips, or tongue -breathing problems -chest pain -fast, irregular heartbeat -hallucinations -seizures -unusually weak or tired Side effects that usually do not require medical attention (report to your doctor or health care professional if they continue or are bothersome): -headache -nausea, vomiting This list may not describe all possible side effects. Call your doctor for medical advice about side effects. You may report side effects to FDA at 1-800-FDA-1088. Where should I keep my medicine? Keep out of the reach of children. Store at room temperature between 15 and 30 degrees C (59 and 86 degrees F). Keep container tightly closed. Throw away any unused medicine after the expiration date. NOTE: This sheet is a summary. It may not cover all possible information. If you have questions about this medicine, talk to your doctor, pharmacist, or health care provider.  2019 Elsevier/Gold Standard (2017-03-13 13:04:35)

## 2018-07-03 NOTE — Progress Notes (Signed)
[facility]    Provider:  Dr Jaynee Eagles Referring Provider: Leamon Arnt, MD Primary Care Provider:  Leamon Arnt, MD  CC:  Numbness of the left side of face during a migraine  HPI:  Cassandra [name] is a 58 y.o. female here as requested by provider Leamon Arnt, MD numbness left side of face. PMHx migraine, depression, anxiety, fibromyalgia. Patient went to the bank she got a crazy headache and was dizzy, she was driving and she had a numbness on the left side of the face and down the arm. Te symptoms started for 30 minutes. No smoking. No HTN or HLD. She eats healthy. She is under stress, happened Dec 3rd. CT of te head 12/4 was negative. Lasted 24 hours in the setting of headache and migrainous symptoms. Migraines "every so often". She does not have the migraines often without stress.   Reviewed notes, labs and imaging from outside physicians, which showed:  CT head 05/2018 showed No acute intracranial abnormalities including mass lesion or mass effect, hydrocephalus, extra-axial fluid collection, midline shift, hemorrhage, or acute infarction, large ischemic events (personally reviewed images)  Reviewed MRI brain report normal 10/2016  Review of Systems: Patient complains of symptoms per HPI as well as the following symptoms: easy bruising, sleepiness, increased thirst, joint pain and swelling. Pertinent negatives and positives per HPI. All others negative.   Social History   Socioeconomic History  . Marital status: Single    Spouse name: Not on file  . Number of children: 1  . Years of education: Not on file  . Highest education level: Associate degree: academic program  Occupational History  . Not on file  Social Needs  . Financial resource strain: Not on file  . Food insecurity:    Worry: Not on file    Inability: Not on file  . Transportation needs:    Medical: Not on file    Non-medical: Not on file  Tobacco Use  . Smoking status: Never  Smoker  . Smokeless tobacco: Never Used  Substance and Sexual Activity  . Alcohol use: Never    Alcohol/week: 0.0 standard drinks    Frequency: Never  . Drug use: Never  . Sexual activity: Not on file  Lifestyle  . Physical activity:    Days per week: Not on file    Minutes per session: Not on file  . Stress: Not on file  Relationships  . Social connections:    Talks on phone: Not on file    Gets together: Not on file    Attends religious service: Not on file    Active member of club or organization: Not on file    Attends meetings of clubs or organizations: Not on file    Relationship status: Not on file  . Intimate partner violence:    Fear of current or ex partner: Not on file    Emotionally abused: Not on file    Physically abused: Not on file    Forced sexual activity: Not on file  Other Topics Concern  . Not on file  Social History Narrative   Lives at home alone    Right handed   Caffeine: none    Family History  Problem Relation Age of Onset  . Diabetes Brother   . Heart disease Sister   . Hypertension Sister   . Diabetes Mother   . Dementia Mother   . Alzheimer's disease Mother   . Gout Mother   . Hypertension  Mother   . Stroke Mother   . Colon cancer Neg Hx     Past Medical History:  Diagnosis Date  . Anxiety   . Autoimmune hepatitis ([place])   . Chronic renal insufficiency   . Cirrhosis ([place])   . COLONIC POLYPS, ADENOMATOUS, HX OF 05/05/2008   Qualifier: Diagnosis of  By: [name], [name]    . Depression   . External hemorrhoids   . Fibromyalgia   . GERD (gastroesophageal reflux disease)   . Hyperlipidemia   . Internal hemorrhoids   . Iron deficiency anemia   . Liver replaced by transplant ([place]) 02/22/2009   Qualifier: Diagnosis of  By: Chester Holstein NP, Nevin Bloodgood    . Migraine   . 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 Ruptured disc, cervical    multiple levels  . Statin intolerance 05/13/2018    Patient Active Problem List    Diagnosis Date Noted  . Statin intolerance 05/13/2018  . Cervical facet joint syndrome 03/28/2018  . Spondylosis of cervical region without myelopathy or radiculopathy 03/28/2018  . Lumbar facet arthropathy 12/27/2017  . Primary insomnia 11/04/2017  . Immunosuppressed status ([place]) 10/07/2017  . Adenomatous polyp of colon 09/28/2017  . Cervical stenosis of spine 01/25/2017  . Chronic narcotic dependence ([place]) 11/25/2013  . Fibromyalgia 08/24/2013  . Mixed hyperlipidemia 03/10/2013  . Primary osteoarthritis of right knee 03/01/2013  . Chronic back pain 10/13/2012  . Hx of tuberculosis 10/13/2012  . Impaired glucose tolerance 12/13/2011  . History of liver transplant ([place]) 02/22/2009  . Anxiety disorder 09/16/2007    Past Surgical History:  Procedure Laterality Date  . ABDOMINAL HYSTERECTOMY  2010  . KNEE ARTHROSCOPY Right 2010  . LIVER TRANSPLANT  1992  . ROTATOR CUFF REPAIR Left 2010   x2    Current Outpatient Medications  Medication Sig Dispense Refill  . mycophenolate (CELLCEPT) 500 MG tablet Take 500 mg by mouth 2 (two) times daily.    Marland Kitchen oxyCODONE-acetaminophen (PERCOCET) 10-325 MG tablet Take 1 tablet by mouth every 4 (four) hours as needed for pain.     . PredniSONE (DELTASONE PO) Take 5 mg by mouth daily.     . tacrolimus (PROGRAF) 1 MG capsule Take 1 capsule (1 mg total) by mouth 2 (two) times daily. 60 capsule 1  . cyclobenzaprine (FLEXERIL) 10 MG tablet Take 1 tablet (10 mg total) by mouth at bedtime. 30 tablet 6  . lidocaine (LIDODERM) 5 % Place 1 patch onto the skin daily. Remove & Discard patch within 12 hours or as directed by MD (Patient not taking: Reported on 07/03/2018) 30 patch 0   No current facility-administered medications for this visit.     Allergies as of 07/03/2018 - Review Complete 07/03/2018  Allergen Reaction Noted  . Erythromycin Diarrhea 03/10/2008  . Ezetimibe Other (See Comments) 03/02/2014  . Other Nausea Only and Other (See Comments)  10/13/2012  . Rofecoxib Other (See Comments) 03/10/2008  . Statins Other (See Comments) 11/23/2014  . Zonisamide Other (See Comments) 01/25/2017  . Nsaids  03/10/2008    Vitals: BP (!) 140/92 (BP Location: Left Arm, Patient Position: Sitting)   Pulse 83   Ht 5\' 4"  (1.626 m)   Wt 198 lb (89.8 kg)   BMI 33.99 kg/m  Last Weight:  Wt Readings from Last 1 Encounters:  07/03/18 198 lb (89.8 kg)   Last Height:   Ht Readings from Last 1 Encounters:  07/03/18 5\' 4"  (1.626 m)     Physical exam:  Exam: Gen: NAD, conversant, well nourised, obese, well groomed                     CV: RRR, no MRG. No Carotid Bruits. No peripheral edema, warm, nontender Eyes: Conjunctivae clear without exudates or hemorrhage  Neuro: Detailed Neurologic Exam  Speech:    Speech is normal; fluent and spontaneous with normal comprehension.  Cognition:    The patient is oriented to person, place, and time;     recent and remote memory intact;     language fluent;     normal attention, concentration,     fund of knowledge Cranial Nerves:    The pupils are equal, round, and reactive to light. The fundi are normal and spontaneous venous pulsations are present. Visual fields are full to finger confrontation. Extraocular movements are intact. Trigeminal sensation is intact and the muscles of mastication are normal. The face is symmetric. The palate elevates in the midline. Hearing intact. Voice is normal. Shoulder shrug is normal. The tongue has normal motion without fasciculations.   Coordination:    Normal finger to nose and heel to shin. Normal rapid alternating movements.   Gait:    Heel-toe and tandem gait are normal.   Motor Observation:    No asymmetry, no atrophy, and no involuntary movements noted. Tone:    Normal muscle tone.    Posture:    Posture is normal. normal erect    Strength:    Strength is V/V in the upper and lower limbs.      Sensation: intact to LT     Reflex  Exam:  DTR's:    Deep tendon reflexes in the upper and lower extremities are normal bilaterally.   Toes:    The toes are downgoing bilaterally.   Clonus:    Clonus is absent.    Assessment/Plan:  58 year old with left-sided symptoms in the setting of migraine. CT of the head negative. Likely migraine with aura.  Recommended MRI of the brain she declines at this time  Can give flexeril at night which may help with the headaches and tension   Declines any acute or preventative medication,   Discussed:  There is increased risk for stroke in women with migraine with aura and a  Contraindication for the combined contraceptive pill for use by women who have migraine with aura, which is in line with World Health Organisation recommendations. The risk for women with migraine without aura is lower and other risk factors like smoking are far more likely to increase stroke risk than migraine. There is a recommendation for no smoking and for the use of low estrogen or progestogen only pills particularly for women with migraine with aura. It is important however that women with migraine who are taking the pill do not decide to suddenly stop taking it without discussing this with their doctor. Please discuss with her OB/GYN.  Discussed: To prevent or relieve headaches, try the following: Cool Compress. Lie down and place a cool compress on your head.  Avoid headache triggers. If certain foods or odors seem to have triggered your migraines in the past, avoid them. A headache diary might help you identify triggers.  Include physical activity in your daily routine. Try a daily walk or other moderate aerobic exercise.  Manage stress. Find healthy ways to cope with the stressors, such as delegating tasks on your to-do list.  Practice relaxation techniques. Try deep breathing, yoga, massage and visualization.  Eat regularly.  Eating regularly scheduled meals and maintaining a healthy diet might help prevent  headaches. Also, drink plenty of fluids.  Follow a regular sleep schedule. Sleep deprivation might contribute to headaches Consider biofeedback. With this mind-body technique, you learn to control certain bodily functions - such as muscle tension, heart rate and blood pressure - to prevent headaches or reduce headache pain.    Proceed to emergency room if you experience new or worsening symptoms or symptoms do not resolve, if you have new neurologic symptoms or if headache is severe, or for any concerning symptom.   Provided education and documentation from American headache Society toolbox including articles on: chronic migraine medication overuse headache, chronic migraines, prevention of migraines, behavioral and other nonpharmacologic treatments for headache.   Meds ordered this encounter  Medications  . cyclobenzaprine (FLEXERIL) 10 MG tablet    Sig: Take 1 tablet (10 mg total) by mouth at bedtime.    Dispense:  30 tablet    Refill:  6   Orders Placed This Encounter  Procedures  . TSH     Cc: Leamon Arnt, MD,    Sarina Ill, MD  [facility] [address] [place],  [address]  Phone [phone] Fax [phone]

## 2018-09-08 ENCOUNTER — Encounter: Payer: Medicare Other | Admitting: Family Medicine

## 2018-09-22 ENCOUNTER — Encounter: Payer: Medicare Other | Admitting: Family Medicine

## 2018-09-22 ENCOUNTER — Ambulatory Visit (INDEPENDENT_AMBULATORY_CARE_PROVIDER_SITE_OTHER): Payer: Medicare Other | Admitting: Family Medicine

## 2018-09-22 ENCOUNTER — Encounter: Payer: Self-pay | Admitting: Family Medicine

## 2018-09-22 ENCOUNTER — Other Ambulatory Visit: Payer: Self-pay

## 2018-09-22 VITALS — BP 134/82 | HR 84 | Temp 98.3°F | Resp 16 | Wt 196.4 lb

## 2018-09-22 DIAGNOSIS — H1013 Acute atopic conjunctivitis, bilateral: Secondary | ICD-10-CM

## 2018-09-22 DIAGNOSIS — J309 Allergic rhinitis, unspecified: Secondary | ICD-10-CM | POA: Diagnosis not present

## 2018-09-22 MED ORDER — OLOPATADINE HCL 0.1 % OP SOLN: 1.0000 [drp] | Freq: Two times a day (BID) | OPHTHALMIC | 3 refills | Status: DC

## 2018-09-22 MED ORDER — FLUTICASONE PROPIONATE 50 MCG/ACT NA SUSP: 2.0000 | Freq: Every day | NASAL | 6 refills | Status: DC

## 2018-09-22 NOTE — Patient Instructions (Signed)
Please follow up as scheduled for your next visit with me: 01/15/2019 for CPE  Start the eye drops and nasal spray.  Change to zyrtec or allegra if the claritin is not strong enough during allergy season.  If you have any questions or concerns, please don't hesitate to send me a message via MyChart or call the office at [phone]. Thank you for visiting with Cassandra [name] today! It's our pleasure caring for you.

## 2018-09-22 NOTE — Progress Notes (Signed)
Subjective  CC:  Chief Complaint  Patient presents with  . Allergies    Started in March.. Sneezing, fatigue, and puffy eyes.. She has tried Claritin D    HPI: Cassandra [name] is a 58 y.o. female who presents to the office today to address the problems listed above in the chief complaint.  Allergies are active: nasal congestion and sneezing w/o sxs of infection: no f/c/s, ST or cough. No sinus pain. Drainage is clear. Using claritin D w/o relief. Also with red itching eye. No photophobia or eye pain. No pulmonary sxs   Assessment  1. Allergic conjunctivitis of both eyes and rhinitis      Plan   allergies:  Step up therapy: change to zyrtec or allegra, add flonase and patanol.  Return if not improving.  HM: deferring CPE until august due to covid-19 and immunosuppressed status. She has had recent blood work through specialists. Other hm is up to date   Follow up: Return for as scheduled for cpe.  01/15/2019  No orders of the defined types were placed in this encounter.  Meds ordered this encounter  Medications  . fluticasone (FLONASE) 50 MCG/ACT nasal spray    Sig: Place 2 sprays into both nostrils daily.    Dispense:  16 g    Refill:  6  . olopatadine (PATANOL) 0.1 % ophthalmic solution    Sig: Place 1 drop into both eyes 2 (two) times daily.    Dispense:  5 mL    Refill:  3      I reviewed the patients updated PMH, FH, and SocHx.    Patient Active Problem List   Diagnosis Date Noted  . Statin intolerance 05/13/2018    Priority: High  . Primary insomnia 11/04/2017    Priority: High  . Immunosuppressed status ([place]) 10/07/2017    Priority: High  . Adenomatous polyp of colon 09/28/2017    Priority: High  . Chronic narcotic dependence ([place]) 11/25/2013    Priority: High  . Mixed hyperlipidemia 03/10/2013    Priority: High  . Primary osteoarthritis of right knee 03/01/2013    Priority: High  . Chronic back pain 10/13/2012    Priority: High  . Impaired  glucose tolerance 12/13/2011    Priority: High  . History of liver transplant ([place]) 02/22/2009    Priority: High  . Cervical stenosis of spine 01/25/2017    Priority: Medium  . Fibromyalgia 08/24/2013    Priority: Medium  . Hx of tuberculosis 10/13/2012    Priority: Medium  . Anxiety disorder 09/16/2007    Priority: Medium  . Migraine with aura and without status migrainosus, not intractable 07/03/2018  . Cervical facet joint syndrome 03/28/2018  . Spondylosis of cervical region without myelopathy or radiculopathy 03/28/2018  . Lumbar facet arthropathy 12/27/2017   Current Meds  Medication Sig  . mycophenolate (CELLCEPT) 500 MG tablet Take 500 mg by mouth 2 (two) times daily.  . PredniSONE (DELTASONE PO) Take 5 mg by mouth daily.   . tacrolimus (PROGRAF) 1 MG capsule Take 1 capsule (1 mg total) by mouth 2 (two) times daily.    Allergies: Patient is allergic to erythromycin; ezetimibe; other; rofecoxib; statins; zonisamide; and nsaids. Family History: Patient family history includes Alzheimer's disease in her mother; Dementia in her mother; Diabetes in her brother and mother; Gout in her mother; Heart disease in her sister; Hypertension in her mother and sister; Stroke in her mother. Social History:  Patient  reports that she has never  smoked. She has never used smokeless tobacco. She reports that she does not drink alcohol or use drugs.  Review of Systems: Constitutional: Negative for fever malaise or anorexia Cardiovascular: negative for chest pain Respiratory: negative for SOB or persistent cough Gastrointestinal: negative for abdominal pain  Objective  Vitals: BP 134/82   Pulse 84   Temp 98.3 F (36.8 C) (Oral)   Resp 16   Wt 196 lb 6.4 oz (89.1 kg)   SpO2 99%   BMI 33.71 kg/m  General: no acute distress , A&Ox3 HEENT: PEERL, conjunctiva normal, Oropharynx clear and moist,neck is supple, nl TMs bilaterall Cardiovascular:  RRR without murmur or gallop.   Respiratory:  Good breath sounds bilaterally, CTAB with normal respiratory effort Skin:  Warm, no rashes     Commons side effects, risks, benefits, and alternatives for medications and treatment plan prescribed today were discussed, and the patient expressed understanding of the given instructions. Patient is instructed to call or message via MyChart if he/she has any questions or concerns regarding our treatment plan. No barriers to understanding were identified. We discussed Red Flag symptoms and signs in detail. Patient expressed understanding regarding what to do in case of urgent or emergency type symptoms.   Medication list was reconciled, printed and provided to the patient in AVS. Patient instructions and summary information was reviewed with the patient as documented in the AVS. This note was prepared with assistance of Dragon voice recognition software. Occasional wrong-word or sound-a-like substitutions may have occurred due to the inherent limitations of voice recognition software

## 2018-10-20 ENCOUNTER — Other Ambulatory Visit: Payer: Self-pay | Admitting: Family Medicine

## 2018-10-24 ENCOUNTER — Other Ambulatory Visit (INDEPENDENT_AMBULATORY_CARE_PROVIDER_SITE_OTHER): Payer: Medicare Other

## 2018-10-24 ENCOUNTER — Other Ambulatory Visit: Payer: Self-pay | Admitting: *Deleted

## 2018-10-24 DIAGNOSIS — Z944 Liver transplant status: Secondary | ICD-10-CM

## 2018-10-24 DIAGNOSIS — K754 Autoimmune hepatitis: Secondary | ICD-10-CM

## 2018-10-24 LAB — CBC WITH DIFFERENTIAL/PLATELET
Basophils Absolute: 0 10*3/uL (ref 0.0–0.1)
Basophils Relative: 0.2 % (ref 0.0–3.0)
Eosinophils Absolute: 0.3 10*3/uL (ref 0.0–0.7)
Eosinophils Relative: 4.1 % (ref 0.0–5.0)
HCT: 41.5 % (ref 36.0–46.0)
Hemoglobin: 14.2 g/dL (ref 12.0–15.0)
Lymphocytes Relative: 36.9 % (ref 12.0–46.0)
Lymphs Abs: 2.7 10*3/uL (ref 0.7–4.0)
MCHC: 34.3 g/dL (ref 30.0–36.0)
MCV: 86.3 fl (ref 78.0–100.0)
Monocytes Absolute: 0.8 10*3/uL (ref 0.1–1.0)
Monocytes Relative: 10.9 % (ref 3.0–12.0)
Neutro Abs: 3.5 10*3/uL (ref 1.4–7.7)
Neutrophils Relative %: 47.9 % (ref 43.0–77.0)
Platelets: 184 10*3/uL (ref 150.0–400.0)
RBC: 4.8 Mil/uL (ref 3.87–5.11)
RDW: 13.4 % (ref 11.5–15.5)
WBC: 7.4 10*3/uL (ref 4.0–10.5)

## 2018-10-24 LAB — COMPREHENSIVE METABOLIC PANEL
ALT: 9 U/L (ref 0–35)
AST: 12 U/L (ref 0–37)
Albumin: 4 g/dL (ref 3.5–5.2)
Alkaline Phosphatase: 68 U/L (ref 39–117)
BUN: 22 mg/dL (ref 6–23)
CO2: 22 mEq/L (ref 19–32)
Calcium: 9 mg/dL (ref 8.4–10.5)
Chloride: 105 mEq/L (ref 96–112)
Creatinine, Ser: 1.31 mg/dL — ABNORMAL HIGH (ref 0.40–1.20)
GFR: 50.38 mL/min — ABNORMAL LOW (ref >60.00)
Glucose, Bld: 109 mg/dL — ABNORMAL HIGH (ref 70–99)
Potassium: 3.7 mEq/L (ref 3.5–5.1)
Sodium: 138 mEq/L (ref 135–145)
Total Bilirubin: 0.5 mg/dL (ref 0.2–1.2)
Total Protein: 7.1 g/dL (ref 6.0–8.3)

## 2018-10-25 LAB — TIQ-NTM

## 2018-10-25 LAB — TACROLIMUS LEVEL: Tacrolimus (FK506), Blood: 6.8 ng/mL

## 2018-12-10 ENCOUNTER — Other Ambulatory Visit: Payer: Self-pay

## 2018-12-10 ENCOUNTER — Other Ambulatory Visit (INDEPENDENT_AMBULATORY_CARE_PROVIDER_SITE_OTHER): Payer: Medicare Other

## 2018-12-10 DIAGNOSIS — Z944 Liver transplant status: Secondary | ICD-10-CM | POA: Diagnosis not present

## 2018-12-10 DIAGNOSIS — D849 Immunodeficiency, unspecified: Secondary | ICD-10-CM

## 2018-12-10 DIAGNOSIS — D899 Disorder involving the immune mechanism, unspecified: Secondary | ICD-10-CM | POA: Diagnosis not present

## 2018-12-10 DIAGNOSIS — K754 Autoimmune hepatitis: Secondary | ICD-10-CM

## 2018-12-10 DIAGNOSIS — Z298 Encounter for other specified prophylactic measures: Secondary | ICD-10-CM | POA: Diagnosis not present

## 2018-12-10 LAB — CBC WITH DIFFERENTIAL/PLATELET
Basophils Absolute: 0.1 10*3/uL (ref 0.0–0.1)
Basophils Relative: 1 % (ref 0.0–3.0)
Eosinophils Absolute: 0.2 10*3/uL (ref 0.0–0.7)
Eosinophils Relative: 3.5 % (ref 0.0–5.0)
HCT: 39.6 % (ref 36.0–46.0)
Hemoglobin: 13.2 g/dL (ref 12.0–15.0)
Lymphocytes Relative: 33.4 % (ref 12.0–46.0)
Lymphs Abs: 2.1 10*3/uL (ref 0.7–4.0)
MCHC: 33.3 g/dL (ref 30.0–36.0)
MCV: 87.4 fl (ref 78.0–100.0)
Monocytes Absolute: 0.5 10*3/uL (ref 0.1–1.0)
Monocytes Relative: 7.7 % (ref 3.0–12.0)
Neutro Abs: 3.4 10*3/uL (ref 1.4–7.7)
Neutrophils Relative %: 54.4 % (ref 43.0–77.0)
Platelets: 156 10*3/uL (ref 150.0–400.0)
RBC: 4.54 Mil/uL (ref 3.87–5.11)
RDW: 13.8 % (ref 11.5–15.5)
WBC: 6.2 10*3/uL (ref 4.0–10.5)

## 2018-12-10 LAB — COMPREHENSIVE METABOLIC PANEL WITH GFR
ALT: 13 U/L (ref 0–35)
AST: 13 U/L (ref 0–37)
Albumin: 4.1 g/dL (ref 3.5–5.2)
Alkaline Phosphatase: 70 U/L (ref 39–117)
BUN: 23 mg/dL (ref 6–23)
CO2: 22 meq/L (ref 19–32)
Calcium: 9.1 mg/dL (ref 8.4–10.5)
Chloride: 107 meq/L (ref 96–112)
Creatinine, Ser: 1.24 mg/dL — ABNORMAL HIGH (ref 0.40–1.20)
GFR: 53.65 mL/min — ABNORMAL LOW
Glucose, Bld: 101 mg/dL — ABNORMAL HIGH (ref 70–99)
Potassium: 3.7 meq/L (ref 3.5–5.1)
Sodium: 140 meq/L (ref 135–145)
Total Bilirubin: 0.3 mg/dL (ref 0.2–1.2)
Total Protein: 6.9 g/dL (ref 6.0–8.3)

## 2018-12-12 LAB — TACROLIMUS LEVEL: Tacrolimus (FK506), Blood: 4.7 ng/mL — ABNORMAL LOW

## 2018-12-15 ENCOUNTER — Other Ambulatory Visit: Payer: Self-pay

## 2018-12-20 DIAGNOSIS — Z0279 Encounter for issue of other medical certificate: Secondary | ICD-10-CM

## 2019-01-15 ENCOUNTER — Ambulatory Visit: Payer: Medicare Other | Admitting: Family Medicine

## 2019-01-21 ENCOUNTER — Encounter: Payer: Self-pay | Admitting: Family Medicine

## 2019-01-21 ENCOUNTER — Other Ambulatory Visit: Payer: Self-pay

## 2019-01-21 ENCOUNTER — Ambulatory Visit (INDEPENDENT_AMBULATORY_CARE_PROVIDER_SITE_OTHER): Payer: Medicare Other | Admitting: Family Medicine

## 2019-01-21 VITALS — BP 118/84 | HR 92 | Temp 98.2°F | Resp 16 | Ht 64.0 in | Wt 202.8 lb

## 2019-01-21 DIAGNOSIS — M797 Fibromyalgia: Secondary | ICD-10-CM

## 2019-01-21 DIAGNOSIS — G8929 Other chronic pain: Secondary | ICD-10-CM

## 2019-01-21 DIAGNOSIS — R7302 Impaired glucose tolerance (oral): Secondary | ICD-10-CM

## 2019-01-21 DIAGNOSIS — D899 Disorder involving the immune mechanism, unspecified: Secondary | ICD-10-CM

## 2019-01-21 DIAGNOSIS — Z8041 Family history of malignant neoplasm of ovary: Secondary | ICD-10-CM

## 2019-01-21 DIAGNOSIS — E782 Mixed hyperlipidemia: Secondary | ICD-10-CM | POA: Diagnosis not present

## 2019-01-21 DIAGNOSIS — M545 Low back pain: Secondary | ICD-10-CM | POA: Diagnosis not present

## 2019-01-21 DIAGNOSIS — Z944 Liver transplant status: Secondary | ICD-10-CM

## 2019-01-21 DIAGNOSIS — R140 Abdominal distension (gaseous): Secondary | ICD-10-CM

## 2019-01-21 DIAGNOSIS — Z0001 Encounter for general adult medical examination with abnormal findings: Secondary | ICD-10-CM

## 2019-01-21 DIAGNOSIS — Z0000 Encounter for general adult medical examination without abnormal findings: Secondary | ICD-10-CM

## 2019-01-21 DIAGNOSIS — D849 Immunodeficiency, unspecified: Secondary | ICD-10-CM

## 2019-01-21 LAB — CBC WITH DIFFERENTIAL/PLATELET
Basophils Absolute: 0 10*3/uL (ref 0.0–0.1)
Basophils Relative: 0.6 % (ref 0.0–3.0)
Eosinophils Absolute: 0.2 10*3/uL (ref 0.0–0.7)
Eosinophils Relative: 2.4 % (ref 0.0–5.0)
HCT: 40.9 % (ref 36.0–46.0)
Hemoglobin: 13.7 g/dL (ref 12.0–15.0)
Lymphocytes Relative: 24.5 % (ref 12.0–46.0)
Lymphs Abs: 1.8 10*3/uL (ref 0.7–4.0)
MCHC: 33.4 g/dL (ref 30.0–36.0)
MCV: 87.9 fl (ref 78.0–100.0)
Monocytes Absolute: 0.5 10*3/uL (ref 0.1–1.0)
Monocytes Relative: 7.3 % (ref 3.0–12.0)
Neutro Abs: 4.8 10*3/uL (ref 1.4–7.7)
Neutrophils Relative %: 65.2 % (ref 43.0–77.0)
Platelets: 182 10*3/uL (ref 150.0–400.0)
RBC: 4.65 Mil/uL (ref 3.87–5.11)
RDW: 13.8 % (ref 11.5–15.5)
WBC: 7.3 10*3/uL (ref 4.0–10.5)

## 2019-01-21 LAB — COMPREHENSIVE METABOLIC PANEL
ALT: 21 U/L (ref 0–35)
AST: 19 U/L (ref 0–37)
Albumin: 4.3 g/dL (ref 3.5–5.2)
Alkaline Phosphatase: 84 U/L (ref 39–117)
BUN: 24 mg/dL — ABNORMAL HIGH (ref 6–23)
CO2: 22 mEq/L (ref 19–32)
Calcium: 9.4 mg/dL (ref 8.4–10.5)
Chloride: 105 mEq/L (ref 96–112)
Creatinine, Ser: 1.24 mg/dL — ABNORMAL HIGH (ref 0.40–1.20)
GFR: 53.63 mL/min — ABNORMAL LOW (ref >60.00)
Glucose, Bld: 110 mg/dL — ABNORMAL HIGH (ref 70–99)
Potassium: 4.2 mEq/L (ref 3.5–5.1)
Sodium: 135 mEq/L (ref 135–145)
Total Bilirubin: 0.3 mg/dL (ref 0.2–1.2)
Total Protein: 6.9 g/dL (ref 6.0–8.3)

## 2019-01-21 LAB — LIPID PANEL
Cholesterol: 312 mg/dL — ABNORMAL HIGH (ref 0–200)
HDL: 52 mg/dL (ref >39.00)
NonHDL: 259.58
Total CHOL/HDL Ratio: 6
Triglycerides: 289 mg/dL — ABNORMAL HIGH (ref 0.0–149.0)
VLDL: 57.8 mg/dL — ABNORMAL HIGH (ref 0.0–40.0)

## 2019-01-21 LAB — HEMOGLOBIN A1C: Hgb A1c MFr Bld: 5.9 % (ref 4.6–6.5)

## 2019-01-21 LAB — LDL CHOLESTEROL, DIRECT: Direct LDL: 212 mg/dL

## 2019-01-21 LAB — TSH: TSH: 1.11 u[IU]/mL (ref 0.35–4.50)

## 2019-01-21 NOTE — Patient Instructions (Signed)
Please return in 6 months for recheck knee pain and sugars    If you have any questions or concerns, please don't hesitate to send me a message via MyChart or call the office at [phone]. Thank you for visiting with Cassandra [name] today! It's our pleasure caring for you.   Preventive Care 62-59 Years Old, Female Preventive care refers to visits with your health care provider and lifestyle choices that can promote health and wellness. This includes:  A yearly physical exam. This may also be called an annual well check.  Regular dental visits and eye exams.  Immunizations.  Screening for certain conditions.  Healthy lifestyle choices, such as eating a healthy diet, getting regular exercise, not using drugs or products that contain nicotine and tobacco, and limiting alcohol use. What can I expect for my preventive care visit? Physical exam Your health care provider will check your:  Height and weight. This may be used to calculate body mass index (BMI), which tells if you are at a healthy weight.  Heart rate and blood pressure.  Skin for abnormal spots. Counseling Your health care provider may ask you questions about your:  Alcohol, tobacco, and drug use.  Emotional well-being.  Home and relationship well-being.  Sexual activity.  Eating habits.  Work and work Statistician.  Method of birth control.  Menstrual cycle.  Pregnancy history. What immunizations do I need?  Influenza (flu) vaccine  This is recommended every year. Tetanus, diphtheria, and pertussis (Tdap) vaccine  You may need a Td booster every 10 years. Varicella (chickenpox) vaccine  You may need this if you have not been vaccinated. Zoster (shingles) vaccine  You may need this after age 65. Measles, mumps, and rubella (MMR) vaccine  You may need at least one dose of MMR if you were born in 1957 or later. You may also need a second dose. Pneumococcal conjugate (PCV13) vaccine  You may need this if you  have certain conditions and were not previously vaccinated. Pneumococcal polysaccharide (PPSV23) vaccine  You may need one or two doses if you smoke cigarettes or if you have certain conditions. Meningococcal conjugate (MenACWY) vaccine  You may need this if you have certain conditions. Hepatitis A vaccine  You may need this if you have certain conditions or if you travel or work in places where you may be exposed to hepatitis A. Hepatitis B vaccine  You may need this if you have certain conditions or if you travel or work in places where you may be exposed to hepatitis B. Haemophilus influenzae type b (Hib) vaccine  You may need this if you have certain conditions. Human papillomavirus (HPV) vaccine  If recommended by your health care provider, you may need three doses over 6 months. You may receive vaccines as individual doses or as more than one vaccine together in one shot (combination vaccines). Talk with your health care provider about the risks and benefits of combination vaccines. What tests do I need? Blood tests  Lipid and cholesterol levels. These may be checked every 5 years, or more frequently if you are over 50 years old.  Hepatitis C test.  Hepatitis B test. Screening  Lung cancer screening. You may have this screening every year starting at age 58 if you have a 30-pack-year history of smoking and currently smoke or have quit within the past 15 years.  Colorectal cancer screening. All adults should have this screening starting at age 40 and continuing until age 65. Your health care provider may recommend  screening at age 39 if you are at increased risk. You will have tests every 1-10 years, depending on your results and the type of screening test.  Diabetes screening. This is done by checking your blood sugar (glucose) after you have not eaten for a while (fasting). You may have this done every 1-3 years.  Mammogram. This may be done every 1-2 years. Talk with your  health care provider about when you should start having regular mammograms. This may depend on whether you have a family history of breast cancer.  BRCA-related cancer screening. This may be done if you have a family history of breast, ovarian, tubal, or peritoneal cancers.  Pelvic exam and Pap test. This may be done every 3 years starting at age 20. Starting at age 48, this may be done every 5 years if you have a Pap test in combination with an HPV test. Other tests  Sexually transmitted disease (STD) testing.  Bone density scan. This is done to screen for osteoporosis. You may have this scan if you are at high risk for osteoporosis. Follow these instructions at home: Eating and drinking  Eat a diet that includes fresh fruits and vegetables, whole grains, lean protein, and low-fat dairy.  Take vitamin and mineral supplements as recommended by your health care provider.  Do not drink alcohol if: ? Your health care provider tells you not to drink. ? You are pregnant, may be pregnant, or are planning to become pregnant.  If you drink alcohol: ? Limit how much you have to 0-1 drink a day. ? Be aware of how much alcohol is in your drink. In the [place] one drink equals one 12 oz bottle of beer (355 mL), one 5 oz glass of wine (148 mL), or one 1 oz glass of hard liquor (44 mL). Lifestyle  Take daily care of your teeth and gums.  Stay active. Exercise for at least 30 minutes on 5 or more days each week.  Do not use any products that contain nicotine or tobacco, such as cigarettes, e-cigarettes, and chewing tobacco. If you need help quitting, ask your health care provider.  If you are sexually active, practice safe sex. Use a condom or other form of birth control (contraception) in order to prevent pregnancy and STIs (sexually transmitted infections).  If told by your health care provider, take low-dose aspirin daily starting at age 38. What's next?  Visit your health care provider once  a year for a well check visit.  Ask your health care provider how often you should have your eyes and teeth checked.  Stay up to date on all vaccines. This information is not intended to replace advice given to you by your health care provider. Make sure you discuss any questions you have with your health care provider. Document Released: 06/17/2015 Document Revised: 01/30/2018 Document Reviewed: 01/30/2018 Elsevier Patient Education  2020 [employer].

## 2019-01-21 NOTE — Progress Notes (Signed)
Subjective  Chief Complaint  Patient presents with  . Annual Exam    Fasting for the past 4 hrs  . Bloated    Not having regular BMs, wants to confirm if the hernia is causing sxs.. Denies nausea & vomiting     HPI: Cassandra [name] is a 58 y.o. female who presents to [facility] today for a Female Wellness Visit. She also has the concerns and/or needs as listed above in the chief complaint. These will be addressed in addition to the Health Maintenance Visit.   Wellness Visit: annual visit with health maintenance review and exam without Pap   HM: screens are up to date.  Chronic disease f/u and/or acute problem visit: (deemed necessary to be done in addition to the wellness visit):  immunosuppressed status due to h/o liver transplant and immunosuppressant meds. Staying in and trying to avoid getting covid. F/u with [facility]  Fibro and chronic back pain:no longer on narcotics. Stable.   H/o impaired fasting glucose. No sxs of hyperglycemia. nonfasting today  Knee OA: stable.   C/o intermittent constipation and bloating. At times, increased abdominal girth. Sister is passing from ovarian cancer now. She has made peace with it. No melena. No abdominal localized pain.   Assessment  1. Annual physical exam   2. Immunosuppressed status ([place])   3. Mixed hyperlipidemia   4. Chronic midline low back pain without sciatica   5. Impaired glucose tolerance   6. History of liver transplant ([place])   7. Fibromyalgia   8. Bloating   9. Family history of ovarian cancer      Plan  Female Wellness Visit:  Age appropriate Health Maintenance and Prevention measures were discussed with patient. Included topics are cancer screening recommendations, ways to keep healthy (see AVS) including dietary and exercise recommendations, regular eye and dental care, use of seat belts, and avoidance of moderate alcohol use and tobacco use.   BMI: discussed patient's BMI and  encouraged positive lifestyle modifications to help get to or maintain a target BMI.  HM needs and immunizations were addressed and ordered. See below for orders. See HM and immunization section for updates.  Routine labs and screening tests ordered including cmp, cbc and lipids where appropriate.  Discussed recommendations regarding Vit D and calcium supplementation (see AVS)  Chronic disease management visit and/or acute problem visit:  Chronic problems are stable.  Bloating: could be related to constipation. Trial of miralax. Check labs. Use CA-125 due to family h/o ovarian ca and bloating sxs.   No med changes today.  Follow up: Return in about 6 months (around 07/24/2019) for recheck.  Orders Placed This Encounter  Procedures  . CBC with Differential/Platelet  . Comprehensive metabolic panel  . Lipid panel  . TSH  . Hemoglobin A1c  . CA 125   No orders of the defined types were placed in this encounter.     Lifestyle: Body mass index is 34.81 kg/m. Wt Readings from Last 3 Encounters:  01/21/19 202 lb 12.8 oz (92 kg)  09/22/18 196 lb 6.4 oz (89.1 kg)  07/03/18 198 lb (89.8 kg)    Patient Active Problem List   Diagnosis Date Noted  . Statin intolerance 05/13/2018    Priority: High  . Primary insomnia 11/04/2017    Priority: High  . Immunosuppressed status ([place]) 10/07/2017    Priority: High  . Adenomatous polyp of colon 09/28/2017    Priority: High    Per [facility] found in 2009; followed up in 2011.   Marland Kitchen Chronic narcotic dependence ([place]) 11/25/2013    Priority: High  . Mixed hyperlipidemia 03/10/2013    Priority: High  . Primary osteoarthritis of right knee 03/01/2013    Priority: High    Dr. Michaelle Birks saw in 08/2016 for pain and was considering Synvisc.  High risk for total knee due to immune suppressant post liver transplant. [facility] 2019: severe OA right knee with recurrent effusions. S/p synvisc w/o relief. Holding off on replacement.    .  Chronic back pain 10/13/2012    Priority: High  . Impaired glucose tolerance 12/13/2011    Priority: High  . History of liver transplant ([place]) 02/22/2009    Priority: High    Qualifier: Diagnosis of  By: Chester Holstein NP, [place].  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 Overview:  01/28/1991   . Cervical stenosis of spine 01/25/2017    Priority: Medium  . Fibromyalgia 08/24/2013    Priority: Medium  . Hx of tuberculosis 10/13/2012    Priority: Medium  . Anxiety disorder 09/16/2007    Priority: Medium    Qualifier: History of  By: [name], [name]    Overview:  Has failed prozac, elavil, zoloft in past.  Uses nightly xanax since 2000   . Migraine with aura and without status migrainosus, not intractable 07/03/2018  . Cervical facet joint syndrome 03/28/2018  . Spondylosis of cervical region without myelopathy or radiculopathy 03/28/2018  . Lumbar facet arthropathy 12/27/2017   Health Maintenance  Topic Date Due  . MAMMOGRAM  05/21/2019  . COLONOSCOPY  10/13/2020  . TETANUS/TDAP  02/02/2021  . Hepatitis C Screening  Completed  . HIV Screening  Completed  . INFLUENZA VACCINE  Discontinued   Immunization History  Administered Date(s) Administered  . Hepatitis A, Adult 09/27/2016, 09/26/2017  . Influenza Split 03/05/2012  . Influenza, Quadrivalent, Recombinant, Inj, Pf 04/29/2013  . Influenza, Seasonal, Injecte, Preservative Fre 03/02/2014, 02/28/2015  . Influenza,inj,Quad PF,6+ Mos 03/28/2016  . Influenza-Unspecified 05/26/2013, 03/28/2016  . Pneumococcal Conjugate-13 09/22/2015  . Pneumococcal Polysaccharide-23 09/27/2016  . Tdap 05/17/2008, 12/03/2010   We updated and reviewed the patient's past history in detail and it is documented below. Allergies: Patient is allergic to erythromycin; ezetimibe; other; rofecoxib; statins; zonisamide; and nsaids. Past Medical History Patient  has a past medical history of Anxiety, Autoimmune hepatitis  ([place]), Chronic renal insufficiency, Cirrhosis ([place]), COLONIC POLYPS, ADENOMATOUS, HX OF (05/05/2008), Depression, External hemorrhoids, Fibromyalgia, GERD (gastroesophageal reflux disease), Hyperlipidemia, Internal hemorrhoids, Iron deficiency anemia, Liver replaced by transplant ([place]) (02/22/2009), Migraine, 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, Ruptured disc, cervical, and Statin intolerance (05/13/2018). Past Surgical History Patient  has a past surgical history that includes Liver transplant (1992); Knee arthroscopy (Right, 2010); Rotator cuff repair (Left, 2010); and Abdominal hysterectomy (2010). Family History: Patient family history includes Alzheimer's disease in her mother; Dementia in her mother; Diabetes in her brother and mother; Gout in her mother; Heart disease in her sister; Hypertension in her mother and sister; Stroke in her mother. Social History:  Patient  reports that she has never smoked. She has never used smokeless tobacco. She reports that she does not drink alcohol or use drugs.  Review of Systems: Constitutional: negative for fever or malaise Ophthalmic: negative for photophobia, double vision or loss of vision Cardiovascular: negative for chest pain, dyspnea on exertion, or new LE swelling Respiratory: negative for SOB or persistent cough Gastrointestinal: negative for abdominal pain,  change in bowel habits or melena Genitourinary: negative for dysuria or gross hematuria, no abnormal uterine bleeding or disharge Musculoskeletal: negative for new gait disturbance or muscular weakness Integumentary: negative for new or persistent rashes, no breast lumps Neurological: negative for TIA or stroke symptoms Psychiatric: negative for SI or delusions Allergic/Immunologic: negative for hives  Patient Care Team    Relationship Specialty Notifications Start End  Leamon Arnt, MD PCP - General Family Medicine  10/07/17   Mauri Pole, MD Consulting Physician  Gastroenterology  11/04/17   Lavonia Drafts  Pain Medicine  11/04/17     Objective  Vitals: BP 118/84   Pulse 92   Temp 98.2 F (36.8 C) (Oral)   Resp 16   Ht 5\' 4"  (1.626 m)   Wt 202 lb 12.8 oz (92 kg)   SpO2 99%   BMI 34.81 kg/m  General:  Well developed, well nourished, no acute distress  Psych:  Alert and orientedx3,normal mood and affect HEENT:  Normocephalic, atraumatic, non-icteric sclera, PERRL, oropharynx is clear without mass or exudate, supple neck without adenopathy, mass or thyromegaly Cardiovascular:  Normal S1, S2, RRR without gallop, rub or murmur, nondisplaced PMI Respiratory:  Good breath sounds bilaterally, CTAB with normal respiratory effort Gastrointestinal: normal bowel sounds, soft, non-tender, no noted masses. No HSM,no hernia appreciated MSK: no deformities, contusions. biilateral knees: no significant effusions today. Spine and CVA region are nontender Skin:  Warm, no rashes or suspicious lesions noted Neurologic:    Mental status is normal. CN 2-11 are normal. Gross motor and sensory exams are normal. Normal gait. No tremor Breast Exam: No mass, skin retraction or nipple discharge is appreciated in either breast. No axillary adenopathy. Fibrocystic changes are not noted    Commons side effects, risks, benefits, and alternatives for medications and treatment plan prescribed today were discussed, and the patient expressed understanding of the given instructions. Patient is instructed to call or message via MyChart if he/she has any questions or concerns regarding our treatment plan. No barriers to understanding were identified. We discussed Red Flag symptoms and signs in detail. Patient expressed understanding regarding what to do in case of urgent or emergency type symptoms.   Medication list was reconciled, printed and provided to the patient in AVS. Patient instructions and summary information was reviewed with the patient as documented in the AVS. This note was  prepared with assistance of Dragon voice recognition software. Occasional wrong-word or sound-a-like substitutions may have occurred due to the inherent limitations of voice recognition software

## 2019-01-22 NOTE — Progress Notes (Signed)
Please call patient: I have reviewed his/her lab results. All labs are stable. We are awaiting the ovarian cancer test.

## 2019-01-23 LAB — CA 125: CA 125: 8 U/mL (ref <35)

## 2019-01-26 NOTE — Progress Notes (Signed)
Please call patient: I have reviewed his/her lab results. Good news; the test that can be elevated with ovarian cancer is normal.

## 2019-02-11 ENCOUNTER — Telehealth: Payer: Self-pay | Admitting: Physical Therapy

## 2019-02-11 NOTE — Telephone Encounter (Signed)
LMOVM to return call.. I see that there is [employer] disability forms signed and scanned. Is this correct forms or is there something else. Also seeing if she needs a copy or if there is a # to fax to.

## 2019-02-11 NOTE — Telephone Encounter (Signed)
Copied from [place] [id number]. Topic: General - Other >> Feb 11, 2019  3:41 PM Leward Quan A wrote: Reason for CRM: Patient called to check on the status of the paperwork for her disability claim. She is asking for a call back ASAP please. Ph#  [phone]

## 2019-02-12 NOTE — Telephone Encounter (Signed)
Pt called back and wants a return call at [phone]

## 2019-02-12 NOTE — Telephone Encounter (Signed)
See note

## 2019-02-13 NOTE — Telephone Encounter (Signed)
Spoke with pt, she is aware forms may have been mailed back. She did give a fax number [phone] to have them faxed to as well.

## 2019-04-22 ENCOUNTER — Other Ambulatory Visit (INDEPENDENT_AMBULATORY_CARE_PROVIDER_SITE_OTHER): Payer: Medicare Other

## 2019-04-22 ENCOUNTER — Other Ambulatory Visit: Payer: Self-pay | Admitting: *Deleted

## 2019-04-22 DIAGNOSIS — K754 Autoimmune hepatitis: Secondary | ICD-10-CM

## 2019-04-22 DIAGNOSIS — D849 Immunodeficiency, unspecified: Secondary | ICD-10-CM

## 2019-04-22 DIAGNOSIS — Z944 Liver transplant status: Secondary | ICD-10-CM | POA: Diagnosis not present

## 2019-04-22 LAB — CBC WITH DIFFERENTIAL/PLATELET
Basophils Absolute: 0 10*3/uL (ref 0.0–0.1)
Basophils Relative: 0.6 % (ref 0.0–3.0)
Eosinophils Absolute: 0.2 10*3/uL (ref 0.0–0.7)
Eosinophils Relative: 3.1 % (ref 0.0–5.0)
HCT: 40.2 % (ref 36.0–46.0)
Hemoglobin: 13.3 g/dL (ref 12.0–15.0)
Lymphocytes Relative: 36.2 % (ref 12.0–46.0)
Lymphs Abs: 2.7 10*3/uL (ref 0.7–4.0)
MCHC: 33.2 g/dL (ref 30.0–36.0)
MCV: 87.2 fl (ref 78.0–100.0)
Monocytes Absolute: 0.6 10*3/uL (ref 0.1–1.0)
Monocytes Relative: 7.7 % (ref 3.0–12.0)
Neutro Abs: 3.9 10*3/uL (ref 1.4–7.7)
Neutrophils Relative %: 52.4 % (ref 43.0–77.0)
Platelets: 188 10*3/uL (ref 150.0–400.0)
RBC: 4.61 Mil/uL (ref 3.87–5.11)
RDW: 13.5 % (ref 11.5–15.5)
WBC: 7.5 10*3/uL (ref 4.0–10.5)

## 2019-04-22 LAB — COMPREHENSIVE METABOLIC PANEL
ALT: 21 U/L (ref 0–35)
AST: 16 U/L (ref 0–37)
Albumin: 4 g/dL (ref 3.5–5.2)
Alkaline Phosphatase: 88 U/L (ref 39–117)
BUN: 18 mg/dL (ref 6–23)
CO2: 25 mEq/L (ref 19–32)
Calcium: 8.9 mg/dL (ref 8.4–10.5)
Chloride: 105 mEq/L (ref 96–112)
Creatinine, Ser: 1.22 mg/dL — ABNORMAL HIGH (ref 0.40–1.20)
GFR: 54.6 mL/min — ABNORMAL LOW (ref >60.00)
Glucose, Bld: 107 mg/dL — ABNORMAL HIGH (ref 70–99)
Potassium: 3.6 mEq/L (ref 3.5–5.1)
Sodium: 140 mEq/L (ref 135–145)
Total Bilirubin: 0.4 mg/dL (ref 0.2–1.2)
Total Protein: 7 g/dL (ref 6.0–8.3)

## 2019-04-23 LAB — TACROLIMUS LEVEL: Tacrolimus (FK506), Blood: 6.4 ng/mL

## 2019-05-19 ENCOUNTER — Other Ambulatory Visit (INDEPENDENT_AMBULATORY_CARE_PROVIDER_SITE_OTHER): Payer: Medicare Other

## 2019-05-19 DIAGNOSIS — K754 Autoimmune hepatitis: Secondary | ICD-10-CM | POA: Diagnosis not present

## 2019-05-19 DIAGNOSIS — Z944 Liver transplant status: Secondary | ICD-10-CM

## 2019-05-19 DIAGNOSIS — D849 Immunodeficiency, unspecified: Secondary | ICD-10-CM | POA: Diagnosis not present

## 2019-05-19 LAB — COMPREHENSIVE METABOLIC PANEL
ALT: 20 U/L (ref 0–35)
AST: 16 U/L (ref 0–37)
Albumin: 4 g/dL (ref 3.5–5.2)
Alkaline Phosphatase: 82 U/L (ref 39–117)
BUN: 17 mg/dL (ref 6–23)
CO2: 22 mEq/L (ref 19–32)
Calcium: 9.4 mg/dL (ref 8.4–10.5)
Chloride: 104 mEq/L (ref 96–112)
Creatinine, Ser: 1.3 mg/dL — ABNORMAL HIGH (ref 0.40–1.20)
GFR: 50.73 mL/min — ABNORMAL LOW (ref >60.00)
Glucose, Bld: 121 mg/dL — ABNORMAL HIGH (ref 70–99)
Potassium: 3.5 mEq/L (ref 3.5–5.1)
Sodium: 136 mEq/L (ref 135–145)
Total Bilirubin: 0.5 mg/dL (ref 0.2–1.2)
Total Protein: 7 g/dL (ref 6.0–8.3)

## 2019-05-19 LAB — CBC WITH DIFFERENTIAL/PLATELET
Basophils Absolute: 0.1 10*3/uL (ref 0.0–0.1)
Basophils Relative: 1.3 % (ref 0.0–3.0)
Eosinophils Absolute: 0.3 10*3/uL (ref 0.0–0.7)
Eosinophils Relative: 3.8 % (ref 0.0–5.0)
HCT: 41.3 % (ref 36.0–46.0)
Hemoglobin: 13.9 g/dL (ref 12.0–15.0)
Lymphocytes Relative: 39.8 % (ref 12.0–46.0)
Lymphs Abs: 3 10*3/uL (ref 0.7–4.0)
MCHC: 33.6 g/dL (ref 30.0–36.0)
MCV: 86.1 fl (ref 78.0–100.0)
Monocytes Absolute: 0.8 10*3/uL (ref 0.1–1.0)
Monocytes Relative: 11.3 % (ref 3.0–12.0)
Neutro Abs: 3.3 10*3/uL (ref 1.4–7.7)
Neutrophils Relative %: 43.8 % (ref 43.0–77.0)
Platelets: 182 10*3/uL (ref 150.0–400.0)
RBC: 4.79 Mil/uL (ref 3.87–5.11)
RDW: 13.5 % (ref 11.5–15.5)
WBC: 7.5 10*3/uL (ref 4.0–10.5)

## 2019-05-20 LAB — TACROLIMUS LEVEL: Tacrolimus (FK506), Blood: 6.3 ng/mL

## 2019-05-21 ENCOUNTER — Other Ambulatory Visit: Payer: Self-pay

## 2019-05-21 ENCOUNTER — Ambulatory Visit (INDEPENDENT_AMBULATORY_CARE_PROVIDER_SITE_OTHER): Payer: Medicare Other

## 2019-05-21 DIAGNOSIS — Z0000 Encounter for general adult medical examination without abnormal findings: Secondary | ICD-10-CM

## 2019-05-22 NOTE — Progress Notes (Addendum)
This visit is being conducted via phone call due to the COVID-19 pandemic. This patient has given me verbal consent via phone to conduct this visit, patient states they are participating from their home address. Some vital signs may be absent or patient reported.   Patient identification: identified by name, DOB, and current address.  Location provider: [facility], Office Persons participating in the virtual visit: Denman George, patient, and Dr. Billey Chang     Subjective:   Cassandra [name] is a 58 y.o. female who presents for an Initial Medicare Annual Wellness Visit.  Review of Systems     Cardiac Risk Factors include: sedentary lifestyle    Objective:    There were no vitals filed for this visit. There is no height or weight on file to calculate BMI.  Advanced Directives 05/22/2019 06/29/2018 05/07/2018 10/09/2016  Does Patient Have a Medical Advance Directive? Yes No No No  Type of Advance Directive Living will;Healthcare Power of Attorney - - -  Does patient want to make changes to medical advance directive? No - Patient declined - - -  Copy of [place] in Chart? No - copy requested - - -  Would patient like information on creating a medical advance directive? - No - Patient declined - No - Patient declined    Current Medications (verified) Outpatient Encounter Medications as of 05/21/2019  Medication Sig  . cyclobenzaprine (FLEXERIL) 10 MG tablet Take 1 tablet by mouth as needed.  . fluticasone (FLONASE) 50 MCG/ACT nasal spray Place 2 sprays into both nostrils daily.  . mycophenolate (CELLCEPT) 500 MG tablet Take 500 mg by mouth 2 (two) times daily.  Marland Kitchen olopatadine (PATANOL) 0.1 % ophthalmic solution Place 1 drop into both eyes 2 (two) times daily.  . PredniSONE (DELTASONE PO) Take 5 mg by mouth daily.   . tacrolimus (PROGRAF) 1 MG capsule Take 1 capsule (1 mg total) by mouth 2 (two) times daily.   No facility-administered encounter medications on  file as of 05/21/2019.    Allergies (verified) Erythromycin, Ezetimibe, Other, Rofecoxib, Statins, Zonisamide, and Nsaids   History: Past Medical History:  Diagnosis Date  . Anxiety   . Autoimmune hepatitis ([place])   . Chronic renal insufficiency   . Cirrhosis ([place])   . COLONIC POLYPS, ADENOMATOUS, HX OF 05/05/2008   Qualifier: Diagnosis of  By: [name], [name]    . Depression   . External hemorrhoids   . Fibromyalgia   . GERD (gastroesophageal reflux disease)   . Hyperlipidemia   . Internal hemorrhoids   . Iron deficiency anemia   . 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 Migraine   . 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 Ruptured disc, cervical    multiple levels  . Statin intolerance 05/13/2018   Past Surgical History:  Procedure Laterality Date  . ABDOMINAL HYSTERECTOMY  2010  . KNEE ARTHROSCOPY Right 2010  . LIVER TRANSPLANT  1992  . ROTATOR CUFF REPAIR Left 2010   x2   Family History  Problem Relation Age of Onset  . Diabetes Brother   . Heart disease Sister   . Hypertension Sister   . Diabetes Mother   . Dementia Mother   . Alzheimer's disease Mother   . Gout Mother   . Hypertension Mother   . Stroke Mother   . Colon cancer Neg Hx    Social History  Socioeconomic History  . Marital status: Single    Spouse name: Not on file  . Number of children: 1  . Years of education: Not on file  . Highest education level: Associate degree: academic program  Occupational History  . Not on file  Tobacco Use  . Smoking status: Never Smoker  . Smokeless tobacco: Never Used  Substance and Sexual Activity  . Alcohol use: Never    Alcohol/week: 0.0 standard drinks  . Drug use: Never  . Sexual activity: Not on file  Other Topics Concern  . Not on file  Social History Narrative   Lives at home alone    Right handed   Caffeine: none   Social Determinants of Health   Financial  Resource Strain:   . Difficulty of Paying Living Expenses: Not on file  Food Insecurity:   . Worried About Charity fundraiser in the Last Year: Not on file  . Ran Out of Food in the Last Year: Not on file  Transportation Needs:   . Lack of Transportation (Medical): Not on file  . Lack of Transportation (Non-Medical): Not on file  Physical Activity:   . Days of Exercise per Week: Not on file  . Minutes of Exercise per Session: Not on file  Stress:   . Feeling of Stress : Not on file  Social Connections:   . Frequency of Communication with Friends and Family: Not on file  . Frequency of Social Gatherings with Friends and Family: Not on file  . Attends Religious Services: Not on file  . Active Member of Clubs or Organizations: Not on file  . Attends Archivist Meetings: Not on file  . Marital Status: Not on file    Tobacco Counseling Counseling given: Not Answered   Clinical Intake:  Pre-visit preparation completed: Yes  Diabetes: No  How often do you need to have someone help you when you read instructions, pamphlets, or other written materials from your doctor or pharmacy?: 1 - Never  Interpreter Needed?: No  Information entered by :: Denman George LPN   Activities of Daily Living In your present state of health, do you have any difficulty performing the following activities: 05/22/2019  Hearing? N  Vision? N  Difficulty concentrating or making decisions? N  Walking or climbing stairs? Y  Dressing or bathing? N  Doing errands, shopping? N  Some recent data might be hidden     Immunizations and Health Maintenance Immunization History  Administered Date(s) Administered  . Hepatitis A, Adult 09/27/2016, 09/26/2017  . Influenza Split 03/05/2012  . Influenza, Quadrivalent, Recombinant, Inj, Pf 04/29/2013  . Influenza, Seasonal, Injecte, Preservative Fre 03/02/2014, 02/28/2015  . Influenza,inj,Quad PF,6+ Mos 03/28/2016  . Influenza-Unspecified  05/26/2013, 03/28/2016  . Pneumococcal Conjugate-13 09/22/2015  . Pneumococcal Polysaccharide-23 09/27/2016  . Tdap 05/17/2008, 12/03/2010   Health Maintenance Due  Topic Date Due  . MAMMOGRAM  05/21/2019    Patient Care Team: Leamon Arnt, MD as PCP - General (Family Medicine) Mauri Pole, MD as Consulting Physician (Gastroenterology) Lavonia Drafts (Pain Medicine)  Indicate any recent Medical Services you may have received from other than [facility] providers in the past year (date may be approximate).     Assessment:   This is a routine wellness examination for Biance.  Hearing/Vision screen No exam data present  Dietary issues and exercise activities discussed: Current Exercise Habits: The patient does not participate in regular exercise at present  Goals   None  Depression Screen PHQ 2/9 Scores 05/22/2019 10/17/2017 08/01/2015  PHQ - 2 Score 0 0 0  PHQ- 9 Score - 0 -    Fall Risk Fall Risk  05/22/2019  Falls in the past year? 0  Injury with Fall? 0  Follow up Falls evaluation completed;Education provided;Falls prevention discussed    Is the patient's home free of loose throw rugs in walkways, pet beds, electrical cords, etc?   yes      Grab bars in the bathroom? yes      Handrails on the stairs?   yes      Adequate lighting?   yes   Cognitive Function: no cognitive concerns at this time Cognitive Testing  Alert? Yes         Normal Appearance? N/a  Oriented to person? Yes           Place? Yes  Time? Yes  Recall of three objects? Yes  Can perform simple calculations? Yes  Displays appropriate judgment? Yes  Can read the correct time from a watch face? Yes   Screening Tests Health Maintenance  Topic Date Due  . MAMMOGRAM  05/21/2019  . COLONOSCOPY  10/13/2020  . TETANUS/TDAP  02/02/2021  . Hepatitis C Screening  Completed  . HIV Screening  Completed  . INFLUENZA VACCINE  Discontinued    Qualifies for Shingles Vaccine? Discussed and patient  will check with pharmacy for coverage.  Patient education handout provided   Cancer Screenings: Lung: Low Dose CT Chest recommended if Age 75-80 years, 30 pack-year currently smoking OR have quit w/in 15years. Patient does not qualify. Breast: Up to date on Mammogram? Yes; scheduled for 05/26/19 Up to date of Bone Density/Dexa? Yes Colorectal: colonoscopy 10/14/10    Plan:  I have personally reviewed and addressed the Medicare Annual Wellness questionnaire and have noted the following in the patient's chart:  A. Medical and social history B. Use of alcohol, tobacco or illicit drugs  C. Current medications and supplements D. Functional ability and status E.  Nutritional status F.  Physical activity G. Advance directives H. List of other physicians I.  Hospitalizations, surgeries, and ER visits in previous 12 months J.  [place] such as hearing and vision if needed, cognitive and depression L. Referrals, records requested, and appointments- none   In addition, I have reviewed and discussed with patient certain preventive protocols, quality metrics, and best practice recommendations. A written personalized care plan for preventive services as well as general preventive health recommendations were provided to patient.   Signed,  Denman George, LPN  Nurse Health Advisor   Nurse Notes: no additional

## 2019-05-22 NOTE — Patient Instructions (Signed)
[name] , Thank you for taking time to come for your Medicare Wellness Visit. I appreciate your ongoing commitment to your health goals. Please review the following plan we discussed and let me know if I can assist you in the future.   Screening recommendations/referrals: Colorectal Screening: up to date; last colonoscopy 10/14/10 Mammogram: up to date; scheduled for 05/26/19 Bone Density: up to date   Vision and Dental Exams: Recommended annual ophthalmology exams for early detection of glaucoma and other disorders of the eye Recommended annual dental exams for proper oral hygiene  Vaccinations: Influenza vaccine:  recommended this fall either at PCP office or through your local pharmacy  Pneumococcal vaccine: up to date; last 09/27/16 Tdap vaccine: up to date; last 12/03/10  Shingles vaccine: Please call your insurance company to determine your out of pocket expense for the Shingrix vaccine. You may receive this vaccine at your local pharmacy.  Advanced directives: Please bring a copy of your POA (Power of Attorney) and/or Living Will to your next appointment.  Goals: Recommend to drink at least 6-8 8oz glasses of water per day and consume a balanced diet rich in fresh fruits and vegetables.   Next appointment: Please schedule your Annual Wellness Visit with your Nurse Health Advisor in one year.  Preventive Care 40-64 Years, Female Preventive care refers to lifestyle choices and visits with your health care provider that can promote health and wellness. What does preventive care include?  A yearly physical exam. This is also called an annual well check.  Dental exams once or twice a year.  Routine eye exams. Ask your health care provider how often you should have your eyes checked.  Personal lifestyle choices, including:  Daily care of your teeth and gums.  Regular physical activity.  Eating a healthy diet.  Avoiding tobacco and drug use.  Limiting alcohol  use.  Practicing safe sex.  Taking low-dose aspirin daily starting at age 78 if recommended by your health care provider.  Taking vitamin and mineral supplements as recommended by your health care provider. What happens during an annual well check? The services and screenings done by your health care provider during your annual well check will depend on your age, overall health, lifestyle risk factors, and family history of disease. Counseling  Your health care provider may ask you questions about your:  Alcohol use.  Tobacco use.  Drug use.  Emotional well-being.  Home and relationship well-being.  Sexual activity.  Eating habits.  Work and work Statistician.  Method of birth control.  Menstrual cycle.  Pregnancy history. Screening  You may have the following tests or measurements:  Height, weight, and BMI.  Blood pressure.  Lipid and cholesterol levels. These may be checked every 5 years, or more frequently if you are over 79 years old.  Skin check.  Lung cancer screening. You may have this screening every year starting at age 48 if you have a 30-pack-year history of smoking and currently smoke or have quit within the past 15 years.  Fecal occult blood test (FOBT) of the stool. You may have this test every year starting at age 97.  Flexible sigmoidoscopy or colonoscopy. You may have a sigmoidoscopy every 5 years or a colonoscopy every 10 years starting at age 45.  Hepatitis C blood test.  Hepatitis B blood test.  Sexually transmitted disease (STD) testing.  Diabetes screening. This is done by checking your blood sugar (glucose) after you have not eaten for a while (fasting). You may  have this done every 1-3 years.  Mammogram. This may be done every 1-2 years. Talk to your health care provider about when you should start having regular mammograms. This may depend on whether you have a family history of breast cancer.  BRCA-related cancer screening. This may  be done if you have a family history of breast, ovarian, tubal, or peritoneal cancers.  Pelvic exam and Pap test. This may be done every 3 years starting at age 14. Starting at age 56, this may be done every 5 years if you have a Pap test in combination with an HPV test.  Bone density scan. This is done to screen for osteoporosis. You may have this scan if you are at high risk for osteoporosis. Discuss your test results, treatment options, and if necessary, the need for more tests with your health care provider. Vaccines  Your health care provider may recommend certain vaccines, such as:  Influenza vaccine. This is recommended every year.  Tetanus, diphtheria, and acellular pertussis (Tdap, Td) vaccine. You may need a Td booster every 10 years.  Zoster vaccine. You may need this after age 37.  Pneumococcal 13-valent conjugate (PCV13) vaccine. You may need this if you have certain conditions and were not previously vaccinated.  Pneumococcal polysaccharide (PPSV23) vaccine. You may need one or two doses if you smoke cigarettes or if you have certain conditions. Talk to your health care provider about which screenings and vaccines you need and how often you need them. This information is not intended to replace advice given to you by your health care provider. Make sure you discuss any questions you have with your health care provider. Document Released: 06/17/2015 Document Revised: 02/08/2016 Document Reviewed: 03/22/2015 Elsevier Interactive Patient Education  2017 [place] Prevention in the Home Falls can cause injuries. They can happen to people of all ages. There are many things you can do to make your home safe and to help prevent falls. What can I do on the outside of my home?  Regularly fix the edges of walkways and driveways and fix any cracks.  Remove anything that might make you trip as you walk through a door, such as a raised step or threshold.  Trim any  bushes or trees on the path to your home.  Use bright outdoor lighting.  Clear any walking paths of anything that might make someone trip, such as rocks or tools.  Regularly check to see if handrails are loose or broken. Make sure that both sides of any steps have handrails.  Any raised decks and porches should have guardrails on the edges.  Have any leaves, snow, or ice cleared regularly.  Use sand or salt on walking paths during winter.  Clean up any spills in your garage right away. This includes oil or grease spills. What can I do in the bathroom?  Use night lights.  Install grab bars by the toilet and in the tub and shower. Do not use towel bars as grab bars.  Use non-skid mats or decals in the tub or shower.  If you need to sit down in the shower, use a plastic, non-slip stool.  Keep the floor dry. Clean up any water that spills on the floor as soon as it happens.  Remove soap buildup in the tub or shower regularly.  Attach bath mats securely with double-sided non-slip rug tape.  Do not have throw rugs and other things on the floor that can make you trip.  What can I do in the bedroom?  Use night lights.  Make sure that you have a light by your bed that is easy to reach.  Do not use any sheets or blankets that are too big for your bed. They should not hang down onto the floor.  Have a firm chair that has side arms. You can use this for support while you get dressed.  Do not have throw rugs and other things on the floor that can make you trip. What can I do in the kitchen?  Clean up any spills right away.  Avoid walking on wet floors.  Keep items that you use a lot in easy-to-reach places.  If you need to reach something above you, use a strong step stool that has a grab bar.  Keep electrical cords out of the way.  Do not use floor polish or wax that makes floors slippery. If you must use wax, use non-skid floor wax.  Do not have throw rugs and other  things on the floor that can make you trip. What can I do with my stairs?  Do not leave any items on the stairs.  Make sure that there are handrails on both sides of the stairs and use them. Fix handrails that are broken or loose. Make sure that handrails are as long as the stairways.  Check any carpeting to make sure that it is firmly attached to the stairs. Fix any carpet that is loose or worn.  Avoid having throw rugs at the top or bottom of the stairs. If you do have throw rugs, attach them to the floor with carpet tape.  Make sure that you have a light switch at the top of the stairs and the bottom of the stairs. If you do not have them, ask someone to add them for you. What else can I do to help prevent falls?  Wear shoes that:  Do not have high heels.  Have rubber bottoms.  Are comfortable and fit you well.  Are closed at the toe. Do not wear sandals.  If you use a stepladder:  Make sure that it is fully opened. Do not climb a closed stepladder.  Make sure that both sides of the stepladder are locked into place.  Ask someone to hold it for you, if possible.  Clearly mark and make sure that you can see:  Any grab bars or handrails.  First and last steps.  Where the edge of each step is.  Use tools that help you move around (mobility aids) if they are needed. These include:  Canes.  Walkers.  Scooters.  Crutches.  Turn on the lights when you go into a dark area. Replace any light bulbs as soon as they burn out.  Set up your furniture so you have a clear path. Avoid moving your furniture around.  If any of your floors are uneven, fix them.  If there are any pets around you, be aware of where they are.  Review your medicines with your doctor. Some medicines can make you feel dizzy. This can increase your chance of falling. Ask your doctor what other things that you can do to help prevent falls. This information is not intended to replace advice given to  you by your health care provider. Make sure you discuss any questions you have with your health care provider. Document Released: 03/17/2009 Document Revised: 10/27/2015 Document Reviewed: 06/25/2014 Elsevier Interactive Patient Education  2017 [employer].

## 2019-05-26 LAB — HM MAMMOGRAPHY

## 2019-06-09 ENCOUNTER — Other Ambulatory Visit: Payer: Self-pay

## 2019-06-10 ENCOUNTER — Ambulatory Visit (INDEPENDENT_AMBULATORY_CARE_PROVIDER_SITE_OTHER): Payer: Medicare Other | Admitting: Family Medicine

## 2019-06-10 ENCOUNTER — Ambulatory Visit (INDEPENDENT_AMBULATORY_CARE_PROVIDER_SITE_OTHER): Payer: Medicare Other

## 2019-06-10 ENCOUNTER — Encounter: Payer: Self-pay | Admitting: Family Medicine

## 2019-06-10 VITALS — BP 118/82 | HR 112 | Temp 98.0°F | Ht 64.0 in | Wt 205.8 lb

## 2019-06-10 DIAGNOSIS — D849 Immunodeficiency, unspecified: Secondary | ICD-10-CM | POA: Diagnosis not present

## 2019-06-10 DIAGNOSIS — M25562 Pain in left knee: Secondary | ICD-10-CM

## 2019-06-10 DIAGNOSIS — Z944 Liver transplant status: Secondary | ICD-10-CM

## 2019-06-10 DIAGNOSIS — M1711 Unilateral primary osteoarthritis, right knee: Secondary | ICD-10-CM | POA: Diagnosis not present

## 2019-06-10 MED ORDER — TRIAMCINOLONE ACETONIDE 40 MG/ML IJ SUSP
40.0000 mg | Freq: Once | INTRAMUSCULAR | Status: AC
  Administered 2019-06-10: 12:00:00 40 mg via INTRAMUSCULAR

## 2019-06-10 NOTE — Progress Notes (Signed)
Subjective  CC:  Chief Complaint  Patient presents with  . Left knee and calf pain    fell about a month ago on left knee    HPI: Cassandra [name] is a 59 y.o. female who presents to the office today to address the problems listed above in the chief complaint.  Pt with severe OA of right knee and probable OA of left knee presents due to 48 hours of acute left knee pain.  Started suddenly with pain with weightbearing.  Has pain in anterior and posterior knee.  No swelling but limping.  She feels unstable.  She did fall on her left knee about a month ago while walking the dog.  Had some swelling and pain at that time but that resolved over the course of a week.  No other injuries.  No locking.  No warmth or redness.  No fevers or systemic symptoms.  Left knee with chronic mild pain.  Has had multiple procedures including steroid and Synvisc injections due to severe arthritis.  Has mild effusion now without exacerbation of pain.  No locking or give way.  She is chronically immunosuppressed due to history of liver transplant. Assessment  1. Acute pain of left knee   2. History of liver transplant ([place]) Chronic  3. Immunosuppressed status ([place]) Chronic  4. Primary osteoarthritis of right knee      Plan   Acute pain of left knee: Suspect osteoarthritis, get x-rays.  Status post third injections with routine postprocedure care instructions given.  Follow-up if not improving.  Chronic OA of right knee with effusion: If worsens, return for steroid aspiration injection  At risk of infection, risk discussed.  Follow up: Return for as scheduled.  07/24/2019  Orders Placed This Encounter  Procedures  . DG Knee AP/LAT W/[place] Left (Use Per [place] Fore if unsure which knee order to select)   No orders of the defined types were placed in this encounter.     I reviewed the patients updated PMH, FH, and SocHx.    Patient Active Problem List   Diagnosis Date Noted  . Statin intolerance  05/13/2018    Priority: High  . Primary insomnia 11/04/2017    Priority: High  . Immunosuppressed status ([place]) 10/07/2017    Priority: High  . Adenomatous polyp of colon 09/28/2017    Priority: High  . Mixed hyperlipidemia 03/10/2013    Priority: High  . Primary osteoarthritis of right knee 03/01/2013    Priority: High  . Chronic back pain 10/13/2012    Priority: High  . Impaired glucose tolerance 12/13/2011    Priority: High  . History of liver transplant ([place]) 02/22/2009    Priority: High  . Cervical stenosis of spine 01/25/2017    Priority: Medium  . Fibromyalgia 08/24/2013    Priority: Medium  . Hx of tuberculosis 10/13/2012    Priority: Medium  . Anxiety disorder 09/16/2007    Priority: Medium  . Migraine with aura and without status migrainosus, not intractable 07/03/2018  . Cervical facet joint syndrome 03/28/2018  . Spondylosis of cervical region without myelopathy or radiculopathy 03/28/2018  . Lumbar facet arthropathy 12/27/2017   Current Meds  Medication Sig  . cyclobenzaprine (FLEXERIL) 10 MG tablet Take 1 tablet by mouth as needed.  . mycophenolate (CELLCEPT) 500 MG tablet Take 500 mg by mouth 2 (two) times daily.  . Omega-3 Fatty Acids (FISH OIL) 1000 MG CAPS Take 1 capsule by mouth daily.  . PredniSONE (DELTASONE PO) Take 5  mg by mouth daily.   . tacrolimus (PROGRAF) 1 MG capsule Take 1 capsule (1 mg total) by mouth 2 (two) times daily.    Allergies: Patient is allergic to erythromycin; ezetimibe; other; rofecoxib; statins; zonisamide; and nsaids. Family History: Patient family history includes Alzheimer's disease in her mother; Dementia in her mother; Diabetes in her brother and mother; Gout in her mother; Heart disease in her sister; Hypertension in her mother and sister; Stroke in her mother. Social History:  Patient  reports that she has never smoked. She has never used smokeless tobacco. She reports that she does not drink alcohol or use  drugs.  Review of Systems: Constitutional: Negative for fever malaise or anorexia Cardiovascular: negative for chest pain Respiratory: negative for SOB or persistent cough Gastrointestinal: negative for abdominal pain  Objective  Vitals: BP 118/82 (BP Location: Right Arm, Patient Position: Sitting, Cuff Size: Normal)   Pulse (!) 112   Temp 98 F (36.7 C) (Temporal)   Ht 5\' 4"  (1.626 m)   Wt 205 lb 12.8 oz (93.4 kg)   SpO2 99%   BMI 35.33 kg/m  General: no acute distress , A&Ox3 Limping with antalgic gait Left knee: Without warmth, positive crepitus, mild tenderness medially, no posterior tenderness or swelling.  No effusion.  Full range of motion.  Stable joint Right knee with effusion without warmth. Skin:  Warm, no rashes  Knee Arthrocentesis with Injection Procedure Note  Pre-operative Diagnosis: left knee  Post-operative Diagnosis: same  Indications: Symptom relief from osteoarthritis  Anesthesia: Lidocaine 1% without epinephrine without added sodium bicarbonate  Procedure Details   Verbal consent was obtained for the procedure. Universal time out taken.  The Knee joint was prepped with alcohol and an 18 gauge needle was inserted into the joint from the lateral approach. 3 ml of clear yellow fluid was removed from the joint and discarded. Four ml 1% lidocaine and one ml of triamcinolone (KENALOG) 40mg /ml was then injected into the joint through the same needle. The needle was removed and the area cleansed and dressed.  Complications:  None; patient tolerated the procedure well.    Commons side effects, risks, benefits, and alternatives for medications and treatment plan prescribed today were discussed, and the patient expressed understanding of the given instructions. Patient is instructed to call or message via MyChart if he/she has any questions or concerns regarding our treatment plan. No barriers to understanding were identified. We discussed Red Flag symptoms and  signs in detail. Patient expressed understanding regarding what to do in case of urgent or emergency type symptoms.   Medication list was reconciled, printed and provided to the patient in AVS. Patient instructions and summary information was reviewed with the patient as documented in the AVS. This note was prepared with assistance of Dragon voice recognition software. Occasional wrong-word or sound-a-like substitutions may have occurred due to the inherent limitations of voice recognition software  This visit occurred during the SARS-CoV-2 public health emergency.  Safety protocols were in place, including screening questions prior to the visit, additional usage of staff PPE, and extensive cleaning of exam room while observing appropriate contact time as indicated for disinfecting solutions.

## 2019-06-10 NOTE — Patient Instructions (Addendum)
Please follow up as scheduled for your next visit with me: 07/24/2019   If you have any questions or concerns, please don't hesitate to send me a message via MyChart or call the office at [phone]. Thank you for visiting with [place] today! It's our pleasure caring for you.  You had a steroid injection today.   Things to be aware of after this injection are listed below:  You may experience no significant improvement or even a slight worsening in your symptoms during the first 24 to 48 hours.  After that we expect your symptoms to improve gradually over the next 2 weeks for the medicine to have its maximal effect.  You should continue to have improvement out to 6 weeks after your injection.  I recommend icing the site of the injection for 20 minutes  1-2 times the day of your injection  You may shower but no swimming, tub bath or Jacuzzi for 24 hours.  If your bandage falls off this does not need to be replaced.  It is appropriate to remove the bandage after 4 hours.  You may resume light activities as tolerated.     POSSIBLE PROCEDURE SIDE EFFECTS: The side effects of the injection are usually fairly minimal however if you may experience some of the following side effects that are usually self-limited and will is off on their own.  If you are concerned please feel free to call the office with questions:             Increased numbness or tingling             Nausea or vomiting             Swelling or bruising at the injection site    Please call our office if if you experience any of the following symptoms over the next week as these can be signs of infection:              Fever greater than 100.62F             Significant swelling at the injection site             Significant redness or drainage from the injection site

## 2019-06-10 NOTE — Progress Notes (Signed)
Please call patient: I have reviewed his/her xray results. Knee xrays show arthritis in the knee as we expected. As well there is what we call a "loose body" in the joint that can cause inflammation and pain. I hope the shot will calm things down.  Thanks!

## 2019-06-15 ENCOUNTER — Encounter: Payer: Self-pay | Admitting: Family Medicine

## 2019-06-22 ENCOUNTER — Other Ambulatory Visit (INDEPENDENT_AMBULATORY_CARE_PROVIDER_SITE_OTHER): Payer: Medicare Other

## 2019-06-22 DIAGNOSIS — K754 Autoimmune hepatitis: Secondary | ICD-10-CM | POA: Diagnosis not present

## 2019-06-22 DIAGNOSIS — D849 Immunodeficiency, unspecified: Secondary | ICD-10-CM

## 2019-06-22 DIAGNOSIS — Z944 Liver transplant status: Secondary | ICD-10-CM

## 2019-06-22 LAB — COMPREHENSIVE METABOLIC PANEL
ALT: 17 U/L (ref 0–35)
AST: 14 U/L (ref 0–37)
Albumin: 4 g/dL (ref 3.5–5.2)
Alkaline Phosphatase: 83 U/L (ref 39–117)
BUN: 22 mg/dL (ref 6–23)
CO2: 24 mEq/L (ref 19–32)
Calcium: 10.2 mg/dL (ref 8.4–10.5)
Chloride: 103 mEq/L (ref 96–112)
Creatinine, Ser: 1.48 mg/dL — ABNORMAL HIGH (ref 0.40–1.20)
GFR: 43.66 mL/min — ABNORMAL LOW (ref >60.00)
Glucose, Bld: 119 mg/dL — ABNORMAL HIGH (ref 70–99)
Potassium: 3.8 mEq/L (ref 3.5–5.1)
Sodium: 138 mEq/L (ref 135–145)
Total Bilirubin: 0.5 mg/dL (ref 0.2–1.2)
Total Protein: 7.3 g/dL (ref 6.0–8.3)

## 2019-06-22 LAB — CBC WITH DIFFERENTIAL/PLATELET
Basophils Absolute: 0 10*3/uL (ref 0.0–0.1)
Basophils Relative: 0.3 % (ref 0.0–3.0)
Eosinophils Absolute: 0.2 10*3/uL (ref 0.0–0.7)
Eosinophils Relative: 2.4 % (ref 0.0–5.0)
HCT: 42.5 % (ref 36.0–46.0)
Hemoglobin: 14.2 g/dL (ref 12.0–15.0)
Lymphocytes Relative: 43.4 % (ref 12.0–46.0)
Lymphs Abs: 3.7 10*3/uL (ref 0.7–4.0)
MCHC: 33.4 g/dL (ref 30.0–36.0)
MCV: 85.8 fl (ref 78.0–100.0)
Monocytes Absolute: 0.9 10*3/uL (ref 0.1–1.0)
Monocytes Relative: 10.1 % (ref 3.0–12.0)
Neutro Abs: 3.7 10*3/uL (ref 1.4–7.7)
Neutrophils Relative %: 43.8 % (ref 43.0–77.0)
Platelets: 195 10*3/uL (ref 150.0–400.0)
RBC: 4.95 Mil/uL (ref 3.87–5.11)
RDW: 13.3 % (ref 11.5–15.5)
WBC: 8.4 10*3/uL (ref 4.0–10.5)

## 2019-06-23 LAB — TACROLIMUS LEVEL: Tacrolimus (FK506), Blood: 7 ng/mL

## 2019-06-24 ENCOUNTER — Telehealth: Payer: Self-pay | Admitting: Family Medicine

## 2019-06-24 DIAGNOSIS — Z130 Encounter for screening for diseases of the blood and blood-forming organs and certain disorders involving the immune mechanism: Secondary | ICD-10-CM

## 2019-06-24 NOTE — Telephone Encounter (Signed)
Cassandra [name] calls in this morning and says she has been tested in the past for cancer and a few other things but she says her daughter is in the hospital. She asked if we test for spinal muscular atrophy and sickle cell, she says her daughter has recently been diagnosed with it and the hospital is wanting Cassandra [name] to have this the test done to see if she has this gene and to see if she is carrier.

## 2019-06-25 ENCOUNTER — Other Ambulatory Visit (INDEPENDENT_AMBULATORY_CARE_PROVIDER_SITE_OTHER): Payer: Medicare Other

## 2019-06-25 ENCOUNTER — Other Ambulatory Visit: Payer: Self-pay

## 2019-06-25 DIAGNOSIS — Z130 Encounter for screening for diseases of the blood and blood-forming organs and certain disorders involving the immune mechanism: Secondary | ICD-10-CM

## 2019-06-25 NOTE — Telephone Encounter (Signed)
Can call and set up lab appt for blood test to screen for sickle cell trait or disease. I suspect she has already been screened and is negative as it is done with pregnancies and transplants.

## 2019-06-25 NOTE — Telephone Encounter (Signed)
Please advise 

## 2019-06-25 NOTE — Telephone Encounter (Signed)
Patient was scheduled a lab appointment to come in today to for sickle cell screening. She was told that she has already been screened and is neg since it is done with pregnancies and transplants.

## 2019-06-26 NOTE — Addendum Note (Signed)
Addended by: Billey Chang on: 06/26/2019 08:40 AM   Modules accepted: Orders

## 2019-06-26 NOTE — Addendum Note (Signed)
Addended by: Francis Dowse T on: 06/26/2019 09:13 AM   Modules accepted: Orders

## 2019-06-26 NOTE — Progress Notes (Signed)
Please add on hemoglobin electophoresis, dx: sicke cell screen. I've orderedit. Thanks, Dr. Jonni Sanger '

## 2019-06-29 ENCOUNTER — Encounter: Payer: Self-pay | Admitting: Family Medicine

## 2019-06-29 DIAGNOSIS — D573 Sickle-cell trait: Secondary | ICD-10-CM

## 2019-06-29 HISTORY — DX: Sickle-cell trait: D57.3

## 2019-06-29 LAB — HEMOGLOBINOPATHY EVALUATION
Fetal Hemoglobin Testing: <1 % (ref 0.0–1.9)
HCT: 43.4 % (ref 35.0–45.0)
Hemoglobin A2 - HGBRFX: 4.3 % — ABNORMAL HIGH (ref 1.8–3.5)
Hemoglobin: 13.9 g/dL (ref 11.7–15.5)
Hgb A: 56.8 % — ABNORMAL LOW (ref >96.0)
Hgb S Quant: 37.9 % — ABNORMAL HIGH
MCH: 28.6 pg (ref 27.0–33.0)
MCV: 89.3 fL (ref 80.0–100.0)
RBC: 4.86 10*6/uL (ref 3.80–5.10)
RDW: 12.2 % (ref 11.0–15.0)

## 2019-06-29 LAB — TEST AUTHORIZATION

## 2019-06-29 LAB — SICKLE CELL SCREEN: Sickle Solubility Test - HGBRFX: POSITIVE — AB

## 2019-06-29 NOTE — Progress Notes (Signed)
Please call patient: I have reviewed his/her lab results. Pt does have sickle cell trait.

## 2019-07-16 LAB — CBC: RBC: 5.38 — AB (ref 3.87–5.11)

## 2019-07-16 LAB — CBC AND DIFFERENTIAL
HCT: 45 (ref 36–46)
Hemoglobin: 14.9 (ref 12.0–16.0)
Platelets: 245 (ref 150–399)
WBC: 8.1

## 2019-07-20 ENCOUNTER — Other Ambulatory Visit: Payer: Self-pay | Admitting: Neurology

## 2019-07-21 ENCOUNTER — Encounter: Payer: Self-pay | Admitting: Family Medicine

## 2019-07-22 ENCOUNTER — Telehealth: Payer: Self-pay | Admitting: Neurology

## 2019-07-22 NOTE — Telephone Encounter (Signed)
1) Medication(s) Requested (by name): cyclobenzaprine (FLEXERIL) 10 MG tablet   2) Pharmacy of Choice: [facility] [place], [place] - [address] AT [facility] [address]  [place], [place] [address]   3) Special Requests:  Patient states PCP advised to contact neurologist as she has never filled this medication and patients insists neurologist prescribed this previously

## 2019-07-23 ENCOUNTER — Other Ambulatory Visit: Payer: Self-pay | Admitting: Neurology

## 2019-07-23 MED ORDER — CYCLOBENZAPRINE HCL 10 MG PO TABS: 10.0000 mg | ORAL_TABLET | Freq: Three times a day (TID) | ORAL | 2 refills | Status: DC | PRN

## 2019-07-23 NOTE — Telephone Encounter (Signed)
I looked at my note said I would prescribe, refilled until follow up.

## 2019-07-23 NOTE — Telephone Encounter (Signed)
Doesn't appear that I prescribed flexeril. She has not been seen in over a year will wait until appointment thanks

## 2019-07-24 ENCOUNTER — Encounter: Payer: Self-pay | Admitting: Family Medicine

## 2019-07-24 ENCOUNTER — Other Ambulatory Visit (HOSPITAL_COMMUNITY)
Admission: RE | Admit: 2019-07-24 | Discharge: 2019-07-24 | Disposition: A | Discharge status: Home / Self Care | Payer: Medicare Other | Source: Ambulatory Visit | Attending: Family Medicine | Admitting: Family Medicine

## 2019-07-24 ENCOUNTER — Ambulatory Visit (INDEPENDENT_AMBULATORY_CARE_PROVIDER_SITE_OTHER): Payer: Medicare Other | Admitting: Family Medicine

## 2019-07-24 ENCOUNTER — Other Ambulatory Visit: Payer: Self-pay

## 2019-07-24 VITALS — BP 142/86 | HR 90 | Temp 97.9°F | Ht 64.0 in | Wt 201.4 lb

## 2019-07-24 DIAGNOSIS — R7302 Impaired glucose tolerance (oral): Secondary | ICD-10-CM | POA: Diagnosis not present

## 2019-07-24 DIAGNOSIS — N950 Postmenopausal bleeding: Secondary | ICD-10-CM | POA: Insufficient documentation

## 2019-07-24 DIAGNOSIS — M797 Fibromyalgia: Secondary | ICD-10-CM | POA: Diagnosis not present

## 2019-07-24 DIAGNOSIS — Z944 Liver transplant status: Secondary | ICD-10-CM

## 2019-07-24 DIAGNOSIS — R7303 Prediabetes: Secondary | ICD-10-CM

## 2019-07-24 DIAGNOSIS — N368 Other specified disorders of urethra: Secondary | ICD-10-CM

## 2019-07-24 DIAGNOSIS — M170 Bilateral primary osteoarthritis of knee: Secondary | ICD-10-CM | POA: Diagnosis not present

## 2019-07-24 DIAGNOSIS — F419 Anxiety disorder, unspecified: Secondary | ICD-10-CM

## 2019-07-24 DIAGNOSIS — D849 Immunodeficiency, unspecified: Secondary | ICD-10-CM

## 2019-07-24 DIAGNOSIS — E782 Mixed hyperlipidemia: Secondary | ICD-10-CM

## 2019-07-24 LAB — POCT URINALYSIS DIPSTICK
Bilirubin, UA: NEGATIVE
Blood, UA: NEGATIVE
Glucose, UA: NEGATIVE
Ketones, UA: NEGATIVE
Leukocytes, UA: NEGATIVE
Nitrite, UA: NEGATIVE
Protein, UA: NEGATIVE
Spec Grav, UA: 1.025 (ref 1.010–1.025)
Urobilinogen, UA: 0.2 E.U./dL
pH, UA: 5 (ref 5.0–8.0)

## 2019-07-24 LAB — POCT GLYCOSYLATED HEMOGLOBIN (HGB A1C): Hemoglobin A1C: 6.4 % — AB (ref 4.0–5.6)

## 2019-07-24 NOTE — Patient Instructions (Addendum)
Please return in 3 months to recheck your prediabetes, blood pressure. You may schedule a visit for your right knee pain if it worsens as well.  I have placed a referral to a urologist. We will call to get this scheduled.  Work on your diet to improve your sugars.   If you have any questions or concerns, please don't hesitate to send me a message via MyChart or call the office at [phone]. Thank you for visiting with [place] today! It's our pleasure caring for you.   Diabetes Mellitus and Nutrition, Adult When you have diabetes (diabetes mellitus), it is very important to have healthy eating habits because your blood sugar (glucose) levels are greatly affected by what you eat and drink. Eating healthy foods in the appropriate amounts, at about the same times every day, can help you:  Control your blood glucose.  Lower your risk of heart disease.  Improve your blood pressure.  Reach or maintain a healthy weight. Every person with diabetes is different, and each person has different needs for a meal plan. Your health care provider may recommend that you work with a diet and nutrition specialist (dietitian) to make a meal plan that is best for you. Your meal plan may vary depending on factors such as:  The calories you need.  The medicines you take.  Your weight.  Your blood glucose, blood pressure, and cholesterol levels.  Your activity level.  Other health conditions you have, such as heart or kidney disease. How do carbohydrates affect me? Carbohydrates, also called carbs, affect your blood glucose level more than any other type of food. Eating carbs naturally raises the amount of glucose in your blood. Carb counting is a method for keeping track of how many carbs you eat. Counting carbs is important to keep your blood glucose at a healthy level, especially if you use insulin or take certain oral diabetes medicines. It is important to know how many carbs you can safely have in each  meal. This is different for every person. Your dietitian can help you calculate how many carbs you should have at each meal and for each snack. Foods that contain carbs include:  Bread, cereal, rice, pasta, and crackers.  Potatoes and corn.  Peas, beans, and lentils.  Milk and yogurt.  Fruit and juice.  Desserts, such as cakes, cookies, ice cream, and candy. How does alcohol affect me? Alcohol can cause a sudden decrease in blood glucose (hypoglycemia), especially if you use insulin or take certain oral diabetes medicines. Hypoglycemia can be a life-threatening condition. Symptoms of hypoglycemia (sleepiness, dizziness, and confusion) are similar to symptoms of having too much alcohol. If your health care provider says that alcohol is safe for you, follow these guidelines:  Limit alcohol intake to no more than 1 drink per day for nonpregnant women and 2 drinks per day for men. One drink equals 12 oz of beer, 5 oz of wine, or 1 oz of hard liquor.  Do not drink on an empty stomach.  Keep yourself hydrated with water, diet soda, or unsweetened iced tea.  Keep in mind that regular soda, juice, and other mixers may contain a lot of sugar and must be counted as carbs. What are tips for following this plan?  Reading food labels  Start by checking the serving size on the "Nutrition Facts" label of packaged foods and drinks. The amount of calories, carbs, fats, and other nutrients listed on the label is based on one serving of the  item. Many items contain more than one serving per package.  Check the total grams (g) of carbs in one serving. You can calculate the number of servings of carbs in one serving by dividing the total carbs by 15. For example, if a food has 30 g of total carbs, it would be equal to 2 servings of carbs.  Check the number of grams (g) of saturated and trans fats in one serving. Choose foods that have low or no amount of these fats.  Check the number of milligrams  (mg) of salt (sodium) in one serving. Most people should limit total sodium intake to less than 2,300 mg per day.  Always check the nutrition information of foods labeled as "low-fat" or "nonfat". These foods may be higher in added sugar or refined carbs and should be avoided.  Talk to your dietitian to identify your daily goals for nutrients listed on the label. Shopping  Avoid buying canned, premade, or processed foods. These foods tend to be high in fat, sodium, and added sugar.  Shop around the outside edge of the grocery store. This includes fresh fruits and vegetables, bulk grains, fresh meats, and fresh dairy. Cooking  Use low-heat cooking methods, such as baking, instead of high-heat cooking methods like deep frying.  Cook using healthy oils, such as olive, canola, or sunflower oil.  Avoid cooking with butter, cream, or high-fat meats. Meal planning  Eat meals and snacks regularly, preferably at the same times every day. Avoid going long periods of time without eating.  Eat foods high in fiber, such as fresh fruits, vegetables, beans, and whole grains. Talk to your dietitian about how many servings of carbs you can eat at each meal.  Eat 4-6 ounces (oz) of lean protein each day, such as lean meat, chicken, fish, eggs, or tofu. One oz of lean protein is equal to: ? 1 oz of meat, chicken, or fish. ? 1 egg. ?  cup of tofu.  Eat some foods each day that contain healthy fats, such as avocado, nuts, seeds, and fish. Lifestyle  Check your blood glucose regularly.  Exercise regularly as told by your health care provider. This may include: ? 150 minutes of moderate-intensity or vigorous-intensity exercise each week. This could be brisk walking, biking, or water aerobics. ? Stretching and doing strength exercises, such as yoga or weightlifting, at least 2 times a week.  Take medicines as told by your health care provider.  Do not use any products that contain nicotine or  tobacco, such as cigarettes and e-cigarettes. If you need help quitting, ask your health care provider.  Work with a Social worker or diabetes educator to identify strategies to manage stress and any emotional and social challenges. Questions to ask a health care provider  Do I need to meet with a diabetes educator?  Do I need to meet with a dietitian?  What number can I call if I have questions?  When are the best times to check my blood glucose? Where to find more information:  American Diabetes Association: diabetes.org  Academy of Nutrition and Dietetics: www.eatright.[employer] of Diabetes and Digestive and Kidney Diseases (NIH): [employer] Summary  A healthy meal plan will help you control your blood glucose and maintain a healthy lifestyle.  Working with a diet and nutrition specialist (dietitian) can help you make a meal plan that is best for you.  Keep in mind that carbohydrates (carbs) and alcohol have immediate effects on your blood glucose levels. It  is important to count carbs and to use alcohol carefully. This information is not intended to replace advice given to you by your health care provider. Make sure you discuss any questions you have with your health care provider. Document Revised: 05/03/2017 Document Reviewed: 06/25/2016 Elsevier Patient Education  2020 [employer].

## 2019-07-24 NOTE — Progress Notes (Signed)
Subjective  CC:  Chief Complaint  Patient presents with  . Knee Pain    Symptoms improved (left). Right knee is now hurting   . impaired glucose tolerance    does not check levels at home    HPI: Cassandra [name] is a 59 y.o. female who presents to the office today to address the problems listed above in the chief complaint.  6 month f/u visit for chornic medical problems. Overall she is doing well however increased stress due to having to help her daughter who has had some medical issues.   OA bilateral knees: s/p steroid injection last month on left and knee pain resolved. Has chronic oa on left with some mild exacerbation of her pain. No warmth or locking, mild effusion by report  HLD: statin intolerant. [facility] requesting copy of lipids  IFG: reports diet is fairly steady although has been eating more oranges/fruits and on occassions starches. Had one steroid injection last month. No sxs of hyperglycemia.  Fibro and anxiety are stable. Not on meds for either at this point.  C/o pink to bright red bleeding on TP when wiping on and off since October. At first, thought was related to postcoital bleeding but has persisted since. No pelvic pain. S/p abd hysterectomy. No urinary sxs no discharge, pelvic pain or sores notes.    Lab Results  Component Value Date   HGBA1C 6.4 (A) 07/24/2019   HGBA1C 5.9 01/21/2019   HGBA1C 5.8 12/13/2011    Lab Results  Component Value Date   CHOL 312 (H) 01/21/2019   CHOL 155 12/13/2011   CHOL 122 06/12/2011   Lab Results  Component Value Date   HDL 52.00 01/21/2019   HDL 54.90 12/13/2011   HDL 45.20 06/12/2011   Lab Results  Component Value Date   LDLCALC 78 12/13/2011   LDLCALC 64 06/12/2011   LDLCALC 112 (H) 02/04/2008   Lab Results  Component Value Date   TRIG 289.0 (H) 01/21/2019   TRIG 111.0 12/13/2011   TRIG 66.0 06/12/2011   Lab Results  Component Value Date   CHOLHDL 6 01/21/2019   CHOLHDL 3 12/13/2011   CHOLHDL 3 06/12/2011   Lab Results  Component Value Date   LDLDIRECT 212.0 01/21/2019   LDLDIRECT 114.1 10/28/2009   Lab Results  Component Value Date   CREATININE 1.48 (H) 06/22/2019   BUN 22 06/22/2019   NA 138 06/22/2019   K 3.8 06/22/2019   CL 103 06/22/2019   CO2 24 06/22/2019   Lab Results  Component Value Date   ALT 17 06/22/2019   AST 14 06/22/2019   ALKPHOS 83 06/22/2019   BILITOT 0.5 06/22/2019    Assessment  1. Impaired glucose tolerance   2. Primary osteoarthritis of knees, bilateral   3. Fibromyalgia   4. History of liver transplant ([place])   5. Immunosuppressed status ([place])   6. Anxiety disorder, unspecified type   7. Postmenopausal bleeding   8. Prediabetes   9. Mixed hyperlipidemia   10. Mass of urethra      Plan   prediabetes:  Discussed worsening glucose control. Pt will work on improving diet;recheck 3 months. If elevates, will discuss diabetes and treatment goals.   OA knees: left improved. Mild pain on right. Return if worsens.   Fibro and anxiety: stable  Postmenopausal bleeding: ? Vaginal or urethral. S/p hysterectomy. Check for infection. Refer to urology given urethral mass. This or vaginal atrophy could be the source.   HLD: very  elevated readings but pt declines medications and is statin intolerant.    Follow up: Return in about 3 months (around 10/21/2019) for follow up of prediabetes and hypertension.  Visit date not found  Orders Placed This Encounter  Procedures  . Ambulatory referral to Urology  . POCT glycosylated hemoglobin (Hb A1C)  . POCT urinalysis dipstick   No orders of the defined types were placed in this encounter.     I reviewed the patients updated PMH, FH, and SocHx.    Patient Active Problem List   Diagnosis Date Noted  . Statin intolerance 05/13/2018    Priority: High  . Primary insomnia 11/04/2017    Priority: High  . Immunosuppressed status ([place]) 10/07/2017    Priority: High  . Adenomatous polyp of  colon 09/28/2017    Priority: High  . Mixed hyperlipidemia 03/10/2013    Priority: High  . Primary osteoarthritis of knees, bilateral 03/01/2013    Priority: High  . Chronic back pain 10/13/2012    Priority: High  . Impaired glucose tolerance 12/13/2011    Priority: High  . History of liver transplant ([place]) 02/22/2009    Priority: High  . Cervical stenosis of spine 01/25/2017    Priority: Medium  . Fibromyalgia 08/24/2013    Priority: Medium  . Hx of tuberculosis 10/13/2012    Priority: Medium  . Anxiety disorder 09/16/2007    Priority: Medium  . AS (sickle cell trait) ([place]) 06/29/2019  . Migraine with aura and without status migrainosus, not intractable 07/03/2018  . Cervical facet joint syndrome 03/28/2018  . Spondylosis of cervical region without myelopathy or radiculopathy 03/28/2018  . Lumbar facet arthropathy 12/27/2017   Current Meds  Medication Sig  . cyclobenzaprine (FLEXERIL) 10 MG tablet Take 1 tablet (10 mg total) by mouth 3 (three) times daily as needed. Needs appointment for further refills.  . mycophenolate (CELLCEPT) 500 MG tablet Take 500 mg by mouth 2 (two) times daily.  . Omega-3 Fatty Acids (FISH OIL) 1000 MG CAPS Take 1 capsule by mouth daily.  . PredniSONE (DELTASONE PO) Take 5 mg by mouth daily.   . tacrolimus (PROGRAF) 1 MG capsule Take 1 capsule (1 mg total) by mouth 2 (two) times daily.    Allergies: Patient is allergic to erythromycin; ezetimibe; other; rofecoxib; statins; zonisamide; and nsaids. Family History: Patient family history includes Alzheimer's disease in her mother; Dementia in her mother; Diabetes in her brother and mother; Gout in her mother; Heart disease in her sister; Hypertension in her mother and sister; Stroke in her mother. Social History:  Patient  reports that she has never smoked. She has never used smokeless tobacco. She reports that she does not drink alcohol or use drugs.  Review of Systems: Constitutional: Negative for  fever malaise or anorexia Cardiovascular: negative for chest pain Respiratory: negative for SOB or persistent cough Gastrointestinal: negative for abdominal pain  Objective  Vitals: BP (!) 142/86 (BP Location: Right Arm, Patient Position: Sitting, Cuff Size: Normal)   Pulse 90   Temp 97.9 F (36.6 C) (Temporal)   Ht 5\' 4"  (1.626 m)   Wt 201 lb 6.4 oz (91.4 kg)   SpO2 96%   BMI 34.57 kg/m  General: no acute distress , A&Ox3 Cardiovascular:  RRR without murmur or gallop.  Respiratory:  Good breath sounds bilaterally, CTAB with normal respiratory effort Skin:  Warm, no rashes GYN: normal appearing vulva, no rash or lesions; acorn size nontender urethral mass; vaginal atrophy. Thin discharge. No tenderness w/ bimanual  or masses palpated     Commons side effects, risks, benefits, and alternatives for medications and treatment plan prescribed today were discussed, and the patient expressed understanding of the given instructions. Patient is instructed to call or message via MyChart if he/she has any questions or concerns regarding our treatment plan. No barriers to understanding were identified. We discussed Red Flag symptoms and signs in detail. Patient expressed understanding regarding what to do in case of urgent or emergency type symptoms.   Medication list was reconciled, printed and provided to the patient in AVS. Patient instructions and summary information was reviewed with the patient as documented in the AVS. This note was prepared with assistance of Dragon voice recognition software. Occasional wrong-word or sound-a-like substitutions may have occurred due to the inherent limitations of voice recognition software  This visit occurred during the SARS-CoV-2 public health emergency.  Safety protocols were in place, including screening questions prior to the visit, additional usage of staff PPE, and extensive cleaning of exam room while observing appropriate contact time as indicated  for disinfecting solutions.

## 2019-07-27 LAB — CERVICOVAGINAL ANCILLARY ONLY
Bacterial Vaginitis (gardnerella): POSITIVE — AB
Candida Glabrata: NEGATIVE
Candida Vaginitis: NEGATIVE
Chlamydia: NEGATIVE
Comment: NEGATIVE
Comment: NEGATIVE
Comment: NEGATIVE
Comment: NEGATIVE
Comment: NEGATIVE
Comment: NORMAL
Neisseria Gonorrhea: NEGATIVE
Trichomonas: NEGATIVE

## 2019-07-28 MED ORDER — METRONIDAZOLE 500 MG PO TABS: 500.0000 mg | ORAL_TABLET | Freq: Two times a day (BID) | ORAL | 0 refills | Status: AC

## 2019-07-28 NOTE — Addendum Note (Signed)
Addended by: Billey Chang on: 07/28/2019 07:58 AM   Modules accepted: Orders

## 2019-07-28 NOTE — Progress Notes (Signed)
Please call patient: I have reviewed his/her lab results. Her vaginal test shows a female change in bacteria called bacterial vaginosis. This could be causing some irritation; i've ordered antibiotics to take for one week.  No other infections were found.

## 2019-08-07 ENCOUNTER — Other Ambulatory Visit: Payer: Self-pay | Admitting: *Deleted

## 2019-08-07 DIAGNOSIS — Z944 Liver transplant status: Secondary | ICD-10-CM

## 2019-08-07 NOTE — Progress Notes (Signed)
Orders are from [facility], Patient comes to our lab to have drawn

## 2019-08-10 ENCOUNTER — Other Ambulatory Visit (INDEPENDENT_AMBULATORY_CARE_PROVIDER_SITE_OTHER): Payer: Medicare Other

## 2019-08-10 DIAGNOSIS — Z944 Liver transplant status: Secondary | ICD-10-CM

## 2019-08-10 LAB — COMPREHENSIVE METABOLIC PANEL
ALT: 15 U/L (ref 0–35)
AST: 13 U/L (ref 0–37)
Albumin: 3.8 g/dL (ref 3.5–5.2)
Alkaline Phosphatase: 79 U/L (ref 39–117)
BUN: 24 mg/dL — ABNORMAL HIGH (ref 6–23)
CO2: 22 mEq/L (ref 19–32)
Calcium: 9.2 mg/dL (ref 8.4–10.5)
Chloride: 106 mEq/L (ref 96–112)
Creatinine, Ser: 1.29 mg/dL — ABNORMAL HIGH (ref 0.40–1.20)
GFR: 51.14 mL/min — ABNORMAL LOW (ref >60.00)
Glucose, Bld: 174 mg/dL — ABNORMAL HIGH (ref 70–99)
Potassium: 3.9 mEq/L (ref 3.5–5.1)
Sodium: 137 mEq/L (ref 135–145)
Total Bilirubin: 0.3 mg/dL (ref 0.2–1.2)
Total Protein: 6.9 g/dL (ref 6.0–8.3)

## 2019-08-10 NOTE — Addendum Note (Signed)
Addended by: Isaiah Serge D on: 08/10/2019 11:46 AM   Modules accepted: Orders

## 2019-08-11 LAB — CBC WITH DIFFERENTIAL/PLATELET
Absolute Monocytes: 548 cells/uL (ref 200–950)
Basophils Absolute: 37 cells/uL (ref 0–200)
Basophils Relative: 0.5 %
Eosinophils Absolute: 241 cells/uL (ref 15–500)
Eosinophils Relative: 3.3 %
HCT: 43.1 % (ref 35.0–45.0)
Hemoglobin: 14.1 g/dL (ref 11.7–15.5)
Lymphs Abs: 2905 cells/uL (ref 850–3900)
MCH: 28.3 pg (ref 27.0–33.0)
MCHC: 32.7 g/dL (ref 32.0–36.0)
MCV: 86.5 fL (ref 80.0–100.0)
MPV: 11.8 fL (ref 7.5–12.5)
Monocytes Relative: 7.5 %
Neutro Abs: 3570 cells/uL (ref 1500–7800)
Neutrophils Relative %: 48.9 %
Platelets: 209 10*3/uL (ref 140–400)
RBC: 4.98 10*6/uL (ref 3.80–5.10)
RDW: 13.1 % (ref 11.0–15.0)
Total Lymphocyte: 39.8 %
WBC: 7.3 10*3/uL (ref 3.8–10.8)

## 2019-08-11 LAB — TACROLIMUS LEVEL: Tacrolimus (FK506), Blood: 6.6 ng/mL

## 2019-08-14 ENCOUNTER — Other Ambulatory Visit: Payer: Self-pay

## 2019-09-03 ENCOUNTER — Encounter: Payer: Self-pay | Admitting: Neurology

## 2019-09-03 ENCOUNTER — Ambulatory Visit: Payer: Medicare Other | Admitting: Neurology

## 2019-09-03 ENCOUNTER — Other Ambulatory Visit (INDEPENDENT_AMBULATORY_CARE_PROVIDER_SITE_OTHER): Payer: Medicare Other

## 2019-09-03 ENCOUNTER — Other Ambulatory Visit: Payer: Self-pay

## 2019-09-03 VITALS — BP 131/83 | HR 94 | Temp 97.3°F | Ht 64.0 in | Wt 203.0 lb

## 2019-09-03 DIAGNOSIS — D849 Immunodeficiency, unspecified: Secondary | ICD-10-CM | POA: Diagnosis not present

## 2019-09-03 DIAGNOSIS — G43109 Migraine with aura, not intractable, without status migrainosus: Secondary | ICD-10-CM

## 2019-09-03 DIAGNOSIS — K754 Autoimmune hepatitis: Secondary | ICD-10-CM | POA: Diagnosis not present

## 2019-09-03 DIAGNOSIS — Z944 Liver transplant status: Secondary | ICD-10-CM | POA: Diagnosis not present

## 2019-09-03 LAB — CBC WITH DIFFERENTIAL/PLATELET
Basophils Absolute: 0 10*3/uL (ref 0.0–0.1)
Basophils Relative: 0.6 % (ref 0.0–3.0)
Eosinophils Absolute: 0.2 10*3/uL (ref 0.0–0.7)
Eosinophils Relative: 3.1 % (ref 0.0–5.0)
HCT: 42.2 % (ref 36.0–46.0)
Hemoglobin: 14.2 g/dL (ref 12.0–15.0)
Lymphocytes Relative: 43.2 % (ref 12.0–46.0)
Lymphs Abs: 3.2 10*3/uL (ref 0.7–4.0)
MCHC: 33.5 g/dL (ref 30.0–36.0)
MCV: 86.4 fl (ref 78.0–100.0)
Monocytes Absolute: 0.7 10*3/uL (ref 0.1–1.0)
Monocytes Relative: 9.5 % (ref 3.0–12.0)
Neutro Abs: 3.2 10*3/uL (ref 1.4–7.7)
Neutrophils Relative %: 43.6 % (ref 43.0–77.0)
Platelets: 185 10*3/uL (ref 150.0–400.0)
RBC: 4.89 Mil/uL (ref 3.87–5.11)
RDW: 14.3 % (ref 11.5–15.5)
WBC: 7.3 10*3/uL (ref 4.0–10.5)

## 2019-09-03 LAB — COMPREHENSIVE METABOLIC PANEL
ALT: 20 U/L (ref 0–35)
AST: 19 U/L (ref 0–37)
Albumin: 4.2 g/dL (ref 3.5–5.2)
Alkaline Phosphatase: 93 U/L (ref 39–117)
BUN: 24 mg/dL — ABNORMAL HIGH (ref 6–23)
CO2: 24 mEq/L (ref 19–32)
Calcium: 9.6 mg/dL (ref 8.4–10.5)
Chloride: 105 mEq/L (ref 96–112)
Creatinine, Ser: 1.22 mg/dL — ABNORMAL HIGH (ref 0.40–1.20)
GFR: 54.53 mL/min — ABNORMAL LOW (ref >60.00)
Glucose, Bld: 114 mg/dL — ABNORMAL HIGH (ref 70–99)
Potassium: 3.6 mEq/L (ref 3.5–5.1)
Sodium: 137 mEq/L (ref 135–145)
Total Bilirubin: 0.5 mg/dL (ref 0.2–1.2)
Total Protein: 7.2 g/dL (ref 6.0–8.3)

## 2019-09-03 MED ORDER — UBRELVY 50 MG PO TABS: 50.0000 mg | ORAL_TABLET | ORAL | 0 refills | Status: DC | PRN

## 2019-09-03 MED ORDER — NURTEC 75 MG PO TBDP: 75.0000 mg | ORAL_TABLET | Freq: Every day | ORAL | 0 refills | Status: DC | PRN

## 2019-09-03 MED ORDER — CYCLOBENZAPRINE HCL 10 MG PO TABS: 10.0000 mg | ORAL_TABLET | Freq: Three times a day (TID) | ORAL | 3 refills | Status: DC | PRN

## 2019-09-03 NOTE — Patient Instructions (Signed)
Rimegepant oral dissolving tablet What is this medicine? RIMEGEPANT (ri ME je pant) is used to treat migraine headaches with or without aura. An aura is a strange feeling or visual disturbance that warns you of an attack. It is not used to prevent migraines. This medicine may be used for other purposes; ask your health care provider or pharmacist if you have questions. COMMON BRAND NAME(S): NURTEC ODT What should I tell my health care provider before I take this medicine? They need to know if you have any of these conditions:  kidney disease  liver disease  an unusual or allergic reaction to rimegepant, other medicines, foods, dyes, or preservatives  pregnant or trying to get pregnant  breast-feeding How should I use this medicine? Take the medicine by mouth. Follow the directions on the prescription label. Leave the tablet in the sealed blister pack until you are ready to take it. With dry hands, open the blister and gently remove the tablet. If the tablet breaks or crumbles, throw it away and take a new tablet out of the blister pack. Place the tablet in the mouth and allow it to dissolve, and then swallow. Do not cut, crush, or chew this medicine. You do not need water to take this medicine. Talk to your pediatrician about the use of this medicine in children. Special care may be needed. Overdosage: If you think you have taken too much of this medicine contact a poison control center or emergency room at once. NOTE: This medicine is only for you. Do not share this medicine with others. What if I miss a dose? This does not apply. This medicine is not for regular use. What may interact with this medicine? This medicine may interact with the following medications:  certain medicines for fungal infections like fluconazole, itraconazole  rifampin This list may not describe all possible interactions. Give your health care provider a list of all the medicines, herbs, non-prescription drugs,  or dietary supplements you use. Also tell them if you smoke, drink alcohol, or use illegal drugs. Some items may interact with your medicine. What should I watch for while using this medicine? Visit your health care professional for regular checks on your progress. Tell your health care professional if your symptoms do not start to get better or if they get worse. What side effects may I notice from receiving this medicine? Side effects that you should report to your doctor or health care professional as soon as possible:  allergic reactions like skin rash, itching or hives; swelling of the face, lips, or tongue Side effects that usually do not require medical attention (report these to your doctor or health care professional if they continue or are bothersome):  nausea This list may not describe all possible side effects. Call your doctor for medical advice about side effects. You may report side effects to FDA at 1-800-FDA-1088. Where should I keep my medicine? Keep out of the reach of children. Store at room temperature between 15 and 30 degrees C (59 and 86 degrees F). Throw away any unused medicine after the expiration date. NOTE: This sheet is a summary. It may not cover all possible information. If you have questions about this medicine, talk to your doctor, pharmacist, or health care provider.  2020 Elsevier/Gold Standard (2018-08-04 00:21:31) Ubrogepant tablets What is this medicine? UBROGEPANT (ue BROE je pant) is used to treat migraine headaches with or without aura. An aura is a strange feeling or visual disturbance that warns you of an  attack. It is not used to prevent migraines. This medicine may be used for other purposes; ask your health care provider or pharmacist if you have questions. COMMON BRAND NAME(S): Roselyn Meier What should I tell my health care provider before I take this medicine? They need to know if you have any of these conditions:  kidney disease  liver  disease  an unusual or allergic reaction to ubrogepant, other medicines, foods, dyes, or preservatives  pregnant or trying to get pregnant  breast-feeding How should I use this medicine? Take this medicine by mouth with a glass of water. Follow the directions on the prescription label. You can take it with or without food. If it upsets your stomach, take it with food. Take your medicine at regular intervals. Do not take it more often than directed. Do not stop taking except on your doctor's advice. Talk to your pediatrician about the use of this medicine in children. Special care may be needed. Overdosage: If you think you have taken too much of this medicine contact a poison control center or emergency room at once. NOTE: This medicine is only for you. Do not share this medicine with others. What if I miss a dose? This does not apply. This medicine is not for regular use. What may interact with this medicine? Do not take this medicine with any of the following medicines:  ceritinib  certain antibiotics like chloramphenicol, clarithromycin, telithromycin  certain antivirals for HIV like atazanavir, cobicistat, darunavir, delavirdine, fosamprenavir, indinavir, ritonavir  certain medicines for fungal infections like itraconazole, ketoconazole, posaconazole, voriconazole  conivaptan  grapefruit  idelalisib  mifepristone  nefazodone  ribociclib This medicine may also interact with the following medications:  carvedilol  certain medicines for seizures like phenobarbital, phenytoin  ciprofloxacin  cyclosporine  eltrombopag  fluconazole  fluvoxamine  quinidine  rifampin  St. John's wort  verapamil This list may not describe all possible interactions. Give your health care provider a list of all the medicines, herbs, non-prescription drugs, or dietary supplements you use. Also tell them if you smoke, drink alcohol, or use illegal drugs. Some items may interact with  your medicine. What should I watch for while using this medicine? Visit your health care professional for regular checks on your progress. Tell your health care professional if your symptoms do not start to get better or if they get worse. Your mouth may get dry. Chewing sugarless gum or sucking hard candy and drinking plenty of water may help. Contact your health care professional if the problem does not go away or is severe. What side effects may I notice from receiving this medicine? Side effects that you should report to your doctor or health care professional as soon as possible:  allergic reactions like skin rash, itching or hives; swelling of the face, lips, or tongue Side effects that usually do not require medical attention (report these to your doctor or health care professional if they continue or are bothersome):  drowsiness  dry mouth  nausea  tiredness This list may not describe all possible side effects. Call your doctor for medical advice about side effects. You may report side effects to FDA at 1-800-FDA-1088. Where should I keep my medicine? Keep out of the reach of children. Store at room temperature between 15 and 30 degrees C (59 and 86 degrees F). Throw away any unused medicine after the expiration date. NOTE: This sheet is a summary. It may not cover all possible information. If you have questions about this medicine, talk  to your doctor, pharmacist, or health care provider.  2020 Elsevier/Gold Standard (2018-08-07 08:50:55)

## 2019-09-03 NOTE — Progress Notes (Signed)
[facility]    Provider:  Dr Jaynee Eagles Referring Provider: Leamon Arnt, MD Primary Care Provider:  Leamon Arnt, MD  CC:  Numbness of the left side of face during a migraine  Interval history 09/03/2019: Patient here for follow up of migraines, Mother and sister passed away it has been very hard. Her daughter had a miscarriage. We discussed acute mangement, triptans, the new medications and I gave her samples of nurtec and ubrelvy and refilled flexeril.  HPI:  Cassandra [name] is a 59 y.o. female here as requested by provider Leamon Arnt, MD numbness left side of face. PMHx migraine, depression, anxiety, fibromyalgia. Patient went to the bank she got a crazy headache and was dizzy, she was driving and she had a numbness on the left side of the face and down the arm. Te symptoms started for 30 minutes. No smoking. No HTN or HLD. She eats healthy. She is under stress, happened Dec 3rd. CT of te head 12/4 was negative. Lasted 24 hours in the setting of headache and migrainous symptoms. Migraines "every so often". She does not have the migraines often without stress.   Reviewed notes, labs and imaging from outside physicians, which showed:  CT head 05/2018 showed No acute intracranial abnormalities including mass lesion or mass effect, hydrocephalus, extra-axial fluid collection, midline shift, hemorrhage, or acute infarction, large ischemic events (personally reviewed images)  Reviewed MRI brain report normal 10/2016  Review of Systems: Patient complains of symptoms per HPI as well as the following symptoms: headache, stress, muscle aches. Pertinent negatives and positives per HPI. All others negative.   Social History   Socioeconomic History  . Marital status: Single    Spouse name: Not on file  . Number of children: 1  . Years of education: Not on file  . Highest education level: Associate degree: academic program  Occupational History  . Not on file   Tobacco Use  . Smoking status: Never Smoker  . Smokeless tobacco: Never Used  Substance and Sexual Activity  . Alcohol use: Never    Alcohol/week: 0.0 standard drinks  . Drug use: Never  . Sexual activity: Not on file  Other Topics Concern  . Not on file  Social History Narrative   Lives at home alone    Right handed   Caffeine: none   Social Determinants of Health   Financial Resource Strain:   . Difficulty of Paying Living Expenses:   Food Insecurity:   . Worried About Charity fundraiser in the Last Year:   . Arboriculturist in the Last Year:   Transportation Needs:   . Film/video editor (Medical):   Marland Kitchen Lack of Transportation (Non-Medical):   Physical Activity:   . Days of Exercise per Week:   . Minutes of Exercise per Session:   Stress:   . Feeling of Stress :   Social Connections:   . Frequency of Communication with Friends and Family:   . Frequency of Social Gatherings with Friends and Family:   . Attends Religious Services:   . Active Member of Clubs or Organizations:   . Attends Archivist Meetings:   Marland Kitchen Marital Status:   Intimate Partner Violence:   . Fear of Current or Ex-Partner:   . Emotionally Abused:   Marland Kitchen Physically Abused:   . Sexually Abused:     Family History  Problem Relation Age of Onset  . Diabetes Brother   . Heart disease Sister   .  Hypertension Sister   . Diabetes Mother   . Dementia Mother   . Alzheimer's disease Mother   . Gout Mother   . Hypertension Mother   . Stroke Mother   . Cancer Sister   . Colon cancer Neg Hx     Past Medical History:  Diagnosis Date  . Anxiety   . AS (sickle cell trait) ([place]) 06/29/2019  . Autoimmune hepatitis ([place])   . Chronic renal insufficiency   . Cirrhosis ([place])   . COLONIC POLYPS, ADENOMATOUS, HX OF 05/05/2008   Qualifier: Diagnosis of  By: [name], [name]    . Depression   . External hemorrhoids   . Fibromyalgia   . GERD (gastroesophageal reflux disease)   .  Hyperlipidemia   . Internal hemorrhoids   . Iron deficiency anemia   . Liver replaced by transplant ([place]) 02/22/2009   Qualifier: Diagnosis of  By: Chester Holstein NP, Nevin Bloodgood    . Migraine   . 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 Ruptured disc, cervical    multiple levels  . Statin intolerance 05/13/2018    Patient Active Problem List   Diagnosis Date Noted  . AS (sickle cell trait) ([place]) 06/29/2019  . Migraine with aura and without status migrainosus, not intractable 07/03/2018  . Statin intolerance 05/13/2018  . Cervical facet joint syndrome 03/28/2018  . Spondylosis of cervical region without myelopathy or radiculopathy 03/28/2018  . Lumbar facet arthropathy 12/27/2017  . Primary insomnia 11/04/2017  . Immunosuppressed status ([place]) 10/07/2017  . Adenomatous polyp of colon 09/28/2017  . Cervical stenosis of spine 01/25/2017  . Fibromyalgia 08/24/2013  . Mixed hyperlipidemia 03/10/2013  . Primary osteoarthritis of knees, bilateral 03/01/2013  . Chronic back pain 10/13/2012  . Hx of tuberculosis 10/13/2012  . Impaired glucose tolerance 12/13/2011  . History of liver transplant ([place]) 02/22/2009  . Anxiety disorder 09/16/2007    Past Surgical History:  Procedure Laterality Date  . ABDOMINAL HYSTERECTOMY  2010  . KNEE ARTHROSCOPY Right 2010  . LIVER TRANSPLANT  1992  . ROTATOR CUFF REPAIR Left 2010   x2    Current Outpatient Medications  Medication Sig Dispense Refill  . cyclobenzaprine (FLEXERIL) 10 MG tablet Take 1 tablet (10 mg total) by mouth 3 (three) times daily as needed. 270 tablet 3  . mycophenolate (CELLCEPT) 500 MG tablet Take 500 mg by mouth 2 (two) times daily.    . Omega-3 Fatty Acids (FISH OIL) 1000 MG CAPS Take 1 capsule by mouth daily.    . PredniSONE (DELTASONE PO) Take 5 mg by mouth daily.     . tacrolimus (PROGRAF) 1 MG capsule Take 1 capsule (1 mg total) by mouth 2 (two) times daily. 60 capsule 1  . Rimegepant Sulfate (NURTEC) 75 MG TBDP Take 75 mg by  mouth daily as needed. For migraines. Take as close to onset of migraine as possible. One daily maximum. 4 tablet 0  . Ubrogepant (UBRELVY) 50 MG TABS Take 50-100 mg by mouth every 2 (two) hours as needed. Max 200mg  a day 10 tablet 0   No current facility-administered medications for this visit.    Allergies as of 09/03/2019 - Review Complete 09/03/2019  Allergen Reaction Noted  . Erythromycin Diarrhea 03/10/2008  . Ezetimibe Other (See Comments) 03/02/2014  . Other Nausea Only and Other (See Comments) 10/13/2012  . Rofecoxib Other (See Comments) 03/10/2008  . Statins Other (See Comments) 11/23/2014  . Zonisamide Other (See Comments) 01/25/2017  . Nsaids  03/10/2008  Vitals: BP 131/83 (BP Location: Right Arm, Patient Position: Sitting)   Pulse 94   Temp (!) 97.3 F (36.3 C) Comment: taken at front  Ht 5\' 4"  (1.626 m)   Wt 203 lb (92.1 kg)   BMI 34.84 kg/m  Last Weight:  Wt Readings from Last 1 Encounters:  09/03/19 203 lb (92.1 kg)   Last Height:   Ht Readings from Last 1 Encounters:  09/03/19 5\' 4"  (1.626 m)    Physical exam: Exam: Gen: NAD, conversant, well nourised, obese, well groomed                   Eyes: Conjunctivae clear without exudates or hemorrhage  Neuro: Detailed Neurologic Exam  Speech:    Speech is normal; fluent and spontaneous with normal comprehension.  Cognition:    The patient is oriented to person, place, and time;     recent and remote memory intact;     language fluent;     normal attention, concentration,     fund of knowledge Cranial Nerves:    The pupils are equal, round, and reactive to light. Visual fields are full to finger confrontation. Extraocular movements are intact. Trigeminal sensation is intact and the muscles of mastication are normal. The face is symmetric. The palate elevates in the midline. Hearing intact. Voice is normal. Shoulder shrug is normal. The tongue has normal motion without fasciculations.    Motor  Observation:    No asymmetry, no atrophy, and no involuntary movements noted. Tone:    Normal muscle tone.    Posture:    Posture is normal. normal erect    Strength:    Strength is V/V in the upper and lower limbs.      Sensation: intact to LT      Assessment/Plan:  Lovely and funny 59 year old female year old with left-sided symptoms in the setting of migraine. CT of the head negative. Likely migraine with aura.  Recommended MRI of the brain she declines at this time  Can give flexeril at night which may help with the headaches and tension, refilled  Declines any preventative medication, try Ubrelvy or Nurtec acutely  Discussed:  There is increased risk for stroke in women with migraine with aura and a  Contraindication for the combined contraceptive pill for use by women who have migraine with aura, which is in line with World Health Organisation recommendations. The risk for women with migraine without aura is lower and other risk factors like smoking are far more likely to increase stroke risk than migraine. There is a recommendation for no smoking and for the use of low estrogen or progestogen only pills particularly for women with migraine with aura. It is important however that women with migraine who are taking the pill do not decide to suddenly stop taking it without discussing this with their doctor. Please discuss with her OB/GYN.  Discussed: To prevent or relieve headaches, try the following: Cool Compress. Lie down and place a cool compress on your head.  Avoid headache triggers. If certain foods or odors seem to have triggered your migraines in the past, avoid them. A headache diary might help you identify triggers.  Include physical activity in your daily routine. Try a daily walk or other moderate aerobic exercise.  Manage stress. Find healthy ways to cope with the stressors, such as delegating tasks on your to-do list.  Practice relaxation techniques. Try deep  breathing, yoga, massage and visualization.  Eat regularly. Eating regularly  scheduled meals and maintaining a healthy diet might help prevent headaches. Also, drink plenty of fluids.  Follow a regular sleep schedule. Sleep deprivation might contribute to headaches Consider biofeedback. With this mind-body technique, you learn to control certain bodily functions -- such as muscle tension, heart rate and blood pressure -- to prevent headaches or reduce headache pain.    Proceed to emergency room if you experience new or worsening symptoms or symptoms do not resolve, if you have new neurologic symptoms or if headache is severe, or for any concerning symptom.   Provided education and documentation from American headache Society toolbox including articles on: chronic migraine medication overuse headache, chronic migraines, prevention of migraines, behavioral and other nonpharmacologic treatments for headache.   Meds ordered this encounter  Medications  . Rimegepant Sulfate (NURTEC) 75 MG TBDP    Sig: Take 75 mg by mouth daily as needed. For migraines. Take as close to onset of migraine as possible. One daily maximum.    Dispense:  4 tablet    Refill:  0  . Ubrogepant (UBRELVY) 50 MG TABS    Sig: Take 50-100 mg by mouth every 2 (two) hours as needed. Max 200mg  a day    Dispense:  10 tablet    Refill:  0  . cyclobenzaprine (FLEXERIL) 10 MG tablet    Sig: Take 1 tablet (10 mg total) by mouth 3 (three) times daily as needed.    Dispense:  270 tablet    Refill:  3   No orders of the defined types were placed in this encounter.    Cc: Leamon Arnt, MD,    Sarina Ill, MD  [facility] [address] [place], [place] [address]  Phone [phone] Fax [phone]  I spent 25 minutes of face-to-face and non-face-to-face time with patient on the  1. Migraine with aura and without status migrainosus, not intractable    diagnosis.  This included previsit  chart review, lab review, study review, order entry, electronic health record documentation, patient education on the different diagnostic and therapeutic options, counseling and coordination of care, risks and benefits of management, compliance, or risk factor reduction

## 2019-09-04 LAB — TACROLIMUS LEVEL: Tacrolimus (FK506), Blood: 4 ng/mL — ABNORMAL LOW

## 2019-09-07 ENCOUNTER — Telehealth: Payer: Self-pay

## 2019-09-07 NOTE — Telephone Encounter (Signed)
Patient advised [facility] will no longer sign the order for her labs to be drawn at [facility] for the [facility].  [facility] nurse notified. Patient instructed to contact her team about where she will go for the next lab.  Patient's appointment cancelled. Patient is aware of this.

## 2019-09-25 ENCOUNTER — Ambulatory Visit: Payer: Medicare Other | Admitting: Gastroenterology

## 2019-10-20 ENCOUNTER — Other Ambulatory Visit: Payer: Self-pay

## 2019-10-20 ENCOUNTER — Telehealth: Payer: Self-pay

## 2019-10-20 NOTE — Telephone Encounter (Signed)
Patient has appt tomorrow, it was front office calling to go over screening...Marland Kitchen please call patient back to go over screening. Thanks

## 2019-10-20 NOTE — Telephone Encounter (Signed)
Patient returning a missed call.

## 2019-10-21 ENCOUNTER — Ambulatory Visit (INDEPENDENT_AMBULATORY_CARE_PROVIDER_SITE_OTHER): Payer: Medicare Other | Admitting: Family Medicine

## 2019-10-21 ENCOUNTER — Encounter: Payer: Self-pay | Admitting: Family Medicine

## 2019-10-21 VITALS — BP 126/64 | HR 104 | Temp 97.9°F | Resp 18 | Ht 64.0 in | Wt 206.0 lb

## 2019-10-21 DIAGNOSIS — J309 Allergic rhinitis, unspecified: Secondary | ICD-10-CM | POA: Diagnosis not present

## 2019-10-21 DIAGNOSIS — R030 Elevated blood-pressure reading, without diagnosis of hypertension: Secondary | ICD-10-CM

## 2019-10-21 DIAGNOSIS — R7302 Impaired glucose tolerance (oral): Secondary | ICD-10-CM | POA: Diagnosis not present

## 2019-10-21 DIAGNOSIS — M170 Bilateral primary osteoarthritis of knee: Secondary | ICD-10-CM

## 2019-10-21 DIAGNOSIS — H1013 Acute atopic conjunctivitis, bilateral: Secondary | ICD-10-CM

## 2019-10-21 MED ORDER — FLUTICASONE PROPIONATE 50 MCG/ACT NA SUSP: 2.0000 | Freq: Every day | NASAL | 6 refills | Status: DC

## 2019-10-21 MED ORDER — OLOPATADINE HCL 0.1 % OP SOLN: 1.0000 [drp] | Freq: Two times a day (BID) | OPHTHALMIC | 3 refills | Status: DC

## 2019-10-21 NOTE — Patient Instructions (Signed)
Please return in 3 months for for your annual complete physical; please come fasting.  Your blood pressure and sugar test look much better. Keep it up!  If you have any questions or concerns, please don't hesitate to send me a message via MyChart or call the office at [phone]. Thank you for visiting with [place] today! It's our pleasure caring for you.

## 2019-10-21 NOTE — Progress Notes (Signed)
Subjective  CC:  Chief Complaint  Patient presents with  . Prediabetic  . Hypertension    HPI: Cassandra [name] is a 59 y.o. female who presents to the office today for follow up of diabetes and problems listed above in the chief complaint.   PreDiabetes follow up: Her sugar control is reported as Improved. Has improved the way she is eating. No longer eating as many fruits and has increased her fiber intake. No sxs of high sugars. Reports recent fasting sugar was 90s by [facility].  She denies exertional CP or SOB or symptomatic hypoglycemia. She denies foot sores or paresthesias.   Eating a low salt diet t improve BP. No cp. No sob  Allergies are active. Had done well with flonase and patanol. Needs refills. No uris sxs. No fevers. No cough.   Right knee: has intermittent swelling and pain. Worse with rapid weather changes or cold. No fevers. Chronic problem.   Wt Readings from Last 3 Encounters:  10/21/19 206 lb (93.4 kg)  09/03/19 203 lb (92.1 kg)  07/24/19 201 lb 6.4 oz (91.4 kg)    BP Readings from Last 3 Encounters:  10/21/19 126/64  09/03/19 131/83  07/24/19 (!) 142/86    Assessment  1. Impaired glucose tolerance   2. Elevated blood pressure reading without diagnosis of hypertension   3. Chronic allergic rhinitis   4. Allergic conjunctivitis of both eyes   5. Primary osteoarthritis of knees, bilateral      Plan   IFG  is currently well controlled. Praised for dietary changes. Has made a big improvement. No new meds. Continue healthy diet.  BP - now normalized. Continue low sodium diet  AR: restart flonase and patanol  Knee OA: intermittent swelling. Will monitor over summer months. IF effusion or pain persists, return for aspiration.   Follow up: No follow-ups on file.. No orders of the defined types were placed in this encounter.  Meds ordered this encounter  Medications  . fluticasone (FLONASE) 50 MCG/ACT nasal spray    Sig: Place 2 sprays into both  nostrils daily.    Dispense:  16 g    Refill:  6  . olopatadine (PATANOL) 0.1 % ophthalmic solution    Sig: Place 1 drop into both eyes 2 (two) times daily.    Dispense:  5 mL    Refill:  3      Immunization History  Administered Date(s) Administered  . Hepatitis A, Adult 09/27/2016, 09/26/2017  . Influenza Split 03/05/2012  . Influenza, Quadrivalent, Recombinant, Inj, Pf 04/29/2013  . Influenza, Seasonal, Injecte, Preservative Fre 03/02/2014, 02/28/2015  . Influenza,inj,Quad PF,6+ Mos 03/28/2016  . Influenza-Unspecified 05/26/2013, 03/28/2016  . PFIZER SARS-COV-2 Vaccination 08/27/2019, 09/17/2019  . Pneumococcal Conjugate-13 09/22/2015  . Pneumococcal Polysaccharide-23 09/27/2016  . Tdap 05/17/2008, 12/03/2010    Diabetes Related Lab Review: Lab Results  Component Value Date   HGBA1C 6.4 (A) 07/24/2019   HGBA1C 5.9 01/21/2019   HGBA1C 5.8 12/13/2011    No results found for: Derl Barrow Lab Results  Component Value Date   CREATININE 1.22 (H) 09/03/2019   BUN 24 (H) 09/03/2019   NA 137 09/03/2019   K 3.6 09/03/2019   CL 105 09/03/2019   CO2 24 09/03/2019   Lab Results  Component Value Date   CHOL 312 (H) 01/21/2019   CHOL 155 12/13/2011   CHOL 122 06/12/2011   Lab Results  Component Value Date   HDL 52.00 01/21/2019   HDL 54.90 12/13/2011  HDL 45.20 06/12/2011   Lab Results  Component Value Date   LDLCALC 78 12/13/2011   LDLCALC 64 06/12/2011   LDLCALC 112 (H) 02/04/2008   Lab Results  Component Value Date   TRIG 289.0 (H) 01/21/2019   TRIG 111.0 12/13/2011   TRIG 66.0 06/12/2011   Lab Results  Component Value Date   CHOLHDL 6 01/21/2019   CHOLHDL 3 12/13/2011   CHOLHDL 3 06/12/2011   Lab Results  Component Value Date   LDLDIRECT 212.0 01/21/2019   LDLDIRECT 114.1 10/28/2009   The 10-year ASCVD risk score Mikey Bussing DC Jr., et al., 2013) is: 7%   Values used to calculate the score:     Age: 84 years     Sex: Female     Is  Non-Hispanic African American: Yes     Diabetic: No     Tobacco smoker: No     Systolic Blood Pressure: 123XX123 mmHg     Is BP treated: No     HDL Cholesterol: 52 mg/dL     Total Cholesterol: 312 mg/dL I have reviewed the PMH, Fam and Soc history. Patient Active Problem List   Diagnosis Date Noted  . Statin intolerance 05/13/2018    Priority: High  . Primary insomnia 11/04/2017    Priority: High  . Immunosuppressed status ([place]) 10/07/2017    Priority: High  . Adenomatous polyp of colon 09/28/2017    Priority: High    Per [facility] found in 2009; followed up in 2011.   . Mixed hyperlipidemia 03/10/2013    Priority: High  . Primary osteoarthritis of knees, bilateral 03/01/2013    Priority: High    Dr. Michaelle Birks saw in 08/2016 for pain and was considering Synvisc.  High risk for total knee due to immune suppressant post liver transplant. [facility] 2019: severe OA right knee with recurrent effusions. S/p synvisc w/o relief. Holding off on replacement.    . Chronic back pain 10/13/2012    Priority: High  . Impaired glucose tolerance 12/13/2011    Priority: High  . History of liver transplant ([place]) 02/22/2009    Priority: High    Qualifier: Diagnosis of  By: Chester Holstein NP, [place].  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 Overview:  01/28/1991   . Cervical stenosis of spine 01/25/2017    Priority: Medium  . Fibromyalgia 08/24/2013    Priority: Medium  . Hx of tuberculosis 10/13/2012    Priority: Medium  . Anxiety disorder 09/16/2007    Priority: Medium    Has failed prozac, elavil, zoloft in past.  Used nightly xanax since 2000; weaned 2019.    . AS (sickle cell trait) ([place]) 06/29/2019  . Migraine with aura and without status migrainosus, not intractable 07/03/2018  . Cervical facet joint syndrome 03/28/2018  . Spondylosis of cervical region without myelopathy or radiculopathy 03/28/2018  . Lumbar facet arthropathy 12/27/2017    Social History: Patient   reports that she has never smoked. She has never used smokeless tobacco. She reports that she does not drink alcohol or use drugs.  Review of Systems: Ophthalmic: negative for eye pain, loss of vision or double vision Cardiovascular: negative for chest pain Respiratory: negative for SOB or persistent cough Gastrointestinal: negative for abdominal pain Genitourinary: negative for dysuria or gross hematuria MSK: negative for foot lesions Neurologic: negative for weakness or gait disturbance  Objective  Vitals: BP 126/64   Pulse (!) 104   Temp 97.9 F (36.6 C) (  Temporal)   Resp 18   Ht 5\' 4"  (1.626 m)   Wt 206 lb (93.4 kg)   SpO2 95%   BMI 35.36 kg/m  General: well appearing, no acute distress  Psych:  Alert and oriented, normal mood and affect HEENT:  Normocephalic, atraumatic, moist mucous membranes, supple neck  Cardiovascular:  Nl S1 and S2, RRR without murmur, gallop or rub. no edema Respiratory:  Good breath sounds bilaterally, CTAB with normal effort, no rales Right knee: mild effusion. Nl gait    Commons side effects, risks, benefits, and alternatives for medications and treatment plan prescribed today were discussed, and the patient expressed understanding of the given instructions. Patient is instructed to call or message via MyChart if he/she has any questions or concerns regarding our treatment plan. No barriers to understanding were identified. We discussed Red Flag symptoms and signs in detail. Patient expressed understanding regarding what to do in case of urgent or emergency type symptoms.   Medication list was reconciled, printed and provided to the patient in AVS. Patient instructions and summary information was reviewed with the patient as documented in the AVS. This note was prepared with assistance of Dragon voice recognition software. Occasional wrong-word or sound-a-like substitutions may have occurred due to the inherent limitations of voice recognition  software  This visit occurred during the SARS-CoV-2 public health emergency.  Safety protocols were in place, including screening questions prior to the visit, additional usage of staff PPE, and extensive cleaning of exam room while observing appropriate contact time as indicated for disinfecting solutions.

## 2019-11-20 ENCOUNTER — Emergency Department (HOSPITAL_BASED_OUTPATIENT_CLINIC_OR_DEPARTMENT_OTHER): Payer: Medicare Other

## 2019-11-20 ENCOUNTER — Encounter (HOSPITAL_BASED_OUTPATIENT_CLINIC_OR_DEPARTMENT_OTHER): Payer: Self-pay | Admitting: Emergency Medicine

## 2019-11-20 ENCOUNTER — Other Ambulatory Visit: Payer: Self-pay

## 2019-11-20 ENCOUNTER — Emergency Department (HOSPITAL_BASED_OUTPATIENT_CLINIC_OR_DEPARTMENT_OTHER)
Admission: EM | Admit: 2019-11-20 | Discharge: 2019-11-21 | Disposition: A | Discharge status: Home / Self Care | Payer: Medicare Other | Attending: Emergency Medicine | Admitting: Emergency Medicine

## 2019-11-20 DIAGNOSIS — R1012 Left upper quadrant pain: Secondary | ICD-10-CM

## 2019-11-20 LAB — LIPASE, BLOOD: Lipase: 40 U/L (ref 11–51)

## 2019-11-20 LAB — COMPREHENSIVE METABOLIC PANEL
ALT: 19 U/L (ref 0–44)
AST: 20 U/L (ref 15–41)
Albumin: 4.2 g/dL (ref 3.5–5.0)
Alkaline Phosphatase: 70 U/L (ref 38–126)
Anion gap: 11 (ref 5–15)
BUN: 21 mg/dL — ABNORMAL HIGH (ref 6–20)
CO2: 23 mmol/L (ref 22–32)
Calcium: 10.1 mg/dL (ref 8.9–10.3)
Chloride: 103 mmol/L (ref 98–111)
Creatinine, Ser: 1.17 mg/dL — ABNORMAL HIGH (ref 0.44–1.00)
GFR calc Af Amer: 59 mL/min — ABNORMAL LOW (ref >60)
GFR calc non Af Amer: 51 mL/min — ABNORMAL LOW (ref >60)
Glucose, Bld: 108 mg/dL — ABNORMAL HIGH (ref 70–99)
Potassium: 3.4 mmol/L — ABNORMAL LOW (ref 3.5–5.1)
Sodium: 137 mmol/L (ref 135–145)
Total Bilirubin: 0.8 mg/dL (ref 0.3–1.2)
Total Protein: 7.1 g/dL (ref 6.5–8.1)

## 2019-11-20 LAB — CBC
HCT: 42.6 % (ref 36.0–46.0)
Hemoglobin: 14.3 g/dL (ref 12.0–15.0)
MCH: 28.3 pg (ref 26.0–34.0)
MCHC: 33.6 g/dL (ref 30.0–36.0)
MCV: 84.2 fL (ref 80.0–100.0)
Platelets: 184 10*3/uL (ref 150–400)
RBC: 5.06 MIL/uL (ref 3.87–5.11)
RDW: 12.9 % (ref 11.5–15.5)
WBC: 6.5 10*3/uL (ref 4.0–10.5)
nRBC: 0 % (ref 0.0–0.2)

## 2019-11-20 MED ORDER — MORPHINE SULFATE (PF) 4 MG/ML IV SOLN
4.0000 mg | Freq: Once | INTRAVENOUS | Status: AC
  Administered 2019-11-20: 4 mg via INTRAVENOUS
  Filled 2019-11-20: qty 1

## 2019-11-20 MED ORDER — IOHEXOL 300 MG/ML  SOLN
100.0000 mL | Freq: Once | INTRAMUSCULAR | Status: AC | PRN
  Administered 2019-11-20: 100 mL via INTRAVENOUS

## 2019-11-20 MED ORDER — SODIUM CHLORIDE 0.9% FLUSH
3.0000 mL | Freq: Once | INTRAVENOUS | Status: DC
  Filled 2019-11-20: qty 3

## 2019-11-20 MED ORDER — ONDANSETRON HCL 4 MG/2ML IJ SOLN
4.0000 mg | Freq: Once | INTRAMUSCULAR | Status: AC
  Administered 2019-11-20: 4 mg via INTRAVENOUS
  Filled 2019-11-20: qty 2

## 2019-11-20 NOTE — ED Triage Notes (Signed)
Pt states she is having upper abd pain on the left side  Pain started yesterday  Pt states she has had vomiting today  Pt states pain is worse today  Denies diarrhea

## 2019-11-21 LAB — URINALYSIS, ROUTINE W REFLEX MICROSCOPIC
Bilirubin Urine: NEGATIVE
Glucose, UA: NEGATIVE mg/dL
Hgb urine dipstick: NEGATIVE
Ketones, ur: NEGATIVE mg/dL
Leukocytes,Ua: NEGATIVE
Nitrite: NEGATIVE
Protein, ur: NEGATIVE mg/dL
Specific Gravity, Urine: 1.01 (ref 1.005–1.030)
pH: 5.5 (ref 5.0–8.0)

## 2019-11-21 LAB — PREGNANCY, URINE: Preg Test, Ur: NEGATIVE

## 2019-11-21 MED ORDER — ONDANSETRON 4 MG PO TBDP: 4.0000 mg | ORAL_TABLET | Freq: Three times a day (TID) | ORAL | 0 refills | Status: DC | PRN

## 2019-11-21 MED ORDER — ACETAMINOPHEN 500 MG PO TABS
500.0000 mg | ORAL_TABLET | Freq: Four times a day (QID) | ORAL | 0 refills | Status: DC | PRN
Start: 2019-11-21 — End: 2020-08-02

## 2019-11-21 NOTE — ED Notes (Signed)
Pt returned from ct scan

## 2019-11-21 NOTE — ED Provider Notes (Signed)
[facility] Provider Note   CSN: [id number] Arrival date & time: 11/20/19  2128     History Chief Complaint  Patient presents with  . Abdominal Pain  . Emesis    Cassandra [name] is a 59 y.o. female.  HPI     This a 59 year old female with a history of liver transplant who presents with abdominal pain.  Patient reports onset of abdominal discomfort yesterday.  She reports that it is in the left upper abdomen.  It is mostly constant.  Nothing seems to make it better or worse.  She rates her pain 8 out of 10.  She has had several episodes of nonbilious, nonbloody emesis.  She reports normal bowel movements.  No fevers.  She denies any chest pain, shortness of breath.  Patient states she is pretty much uncomfortable when she moves.  Denies hematuria or dysuria.  Does have remote history of kidney stones but is unsure whether this feels similar.  Past Medical History:  Diagnosis Date  . Anxiety   . AS (sickle cell trait) ([place]) 06/29/2019  . Autoimmune hepatitis ([place])   . Chronic renal insufficiency   . Cirrhosis ([place])   . COLONIC POLYPS, ADENOMATOUS, HX OF 05/05/2008   Qualifier: Diagnosis of  By: [name], [name]    . Depression   . External hemorrhoids   . Fibromyalgia   . GERD (gastroesophageal reflux disease)   . Hyperlipidemia   . Internal hemorrhoids   . Iron deficiency anemia   . Liver replaced by transplant ([place]) 02/22/2009   Qualifier: Diagnosis of  By: Chester Holstein NP, Nevin Bloodgood    . Migraine   . 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 Ruptured disc, cervical    multiple levels  . Statin intolerance 05/13/2018    Patient Active Problem List   Diagnosis Date Noted  . AS (sickle cell trait) ([place]) 06/29/2019  . Migraine with aura and without status migrainosus, not intractable 07/03/2018  . Statin intolerance 05/13/2018  . Cervical facet joint syndrome 03/28/2018  . Spondylosis of cervical region without myelopathy or  radiculopathy 03/28/2018  . Lumbar facet arthropathy 12/27/2017  . Primary insomnia 11/04/2017  . Immunosuppressed status ([place]) 10/07/2017  . Adenomatous polyp of colon 09/28/2017  . Cervical stenosis of spine 01/25/2017  . Fibromyalgia 08/24/2013  . Mixed hyperlipidemia 03/10/2013  . Primary osteoarthritis of knees, bilateral 03/01/2013  . Chronic back pain 10/13/2012  . Hx of tuberculosis 10/13/2012  . Impaired glucose tolerance 12/13/2011  . History of liver transplant ([place]) 02/22/2009  . Anxiety disorder 09/16/2007    Past Surgical History:  Procedure Laterality Date  . ABDOMINAL HYSTERECTOMY  2010  . KNEE ARTHROSCOPY Right 2010  . LIVER TRANSPLANT  1992  . ROTATOR CUFF REPAIR Left 2010   x2     OB History   No obstetric history on file.     Family History  Problem Relation Age of Onset  . Diabetes Brother   . Heart disease Sister   . Hypertension Sister   . Diabetes Mother   . Dementia Mother   . Alzheimer's disease Mother   . Gout Mother   . Hypertension Mother   . Stroke Mother   . Cancer Sister   . Colon cancer Neg Hx     Social History   Tobacco Use  . Smoking status: Never Smoker  . Smokeless tobacco: Never Used  Vaping Use  . Vaping Use: Never used  Substance Use Topics  .  Alcohol use: Never    Alcohol/week: 0.0 standard drinks  . Drug use: Never    Home Medications Prior to Admission medications   Medication Sig Start Date End Date Taking? Authorizing Provider  acetaminophen (TYLENOL) 500 MG tablet Take 1 tablet (500 mg total) by mouth every 6 (six) hours as needed. 11/21/19   Rajeev Escue, Barbette Hair, MD  cyclobenzaprine (FLEXERIL) 10 MG tablet Take 1 tablet (10 mg total) by mouth 3 (three) times daily as needed. Patient taking differently: Take 10 mg by mouth as needed.  09/03/19   Melvenia Beam, MD  fluticasone (FLONASE) 50 MCG/ACT nasal spray Place 2 sprays into both nostrils daily. 10/21/19   Leamon Arnt, MD  mycophenolate (CELLCEPT) 500  MG tablet Take 500 mg by mouth 2 (two) times daily.    [provider]  olopatadine (PATANOL) 0.1 % ophthalmic solution Place 1 drop into both eyes 2 (two) times daily. 10/21/19   Leamon Arnt, MD  Omega-3 Fatty Acids (FISH OIL) 1000 MG CAPS Take 1 capsule by mouth daily. 09/02/15   [provider]  ondansetron (ZOFRAN ODT) 4 MG disintegrating tablet Take 1 tablet (4 mg total) by mouth every 8 (eight) hours as needed. 11/21/19   Tresha Muzio, Barbette Hair, MD  PredniSONE (DELTASONE PO) Take 5 mg by mouth daily.     [provider]  Rimegepant Sulfate (NURTEC) 75 MG TBDP Take 75 mg by mouth daily as needed. For migraines. Take as close to onset of migraine as possible. One daily maximum. 09/03/19   Melvenia Beam, MD  tacrolimus (PROGRAF) 1 MG capsule Take 1 capsule (1 mg total) by mouth 2 (two) times daily. Patient taking differently: Take 2 mg by mouth 2 (two) times daily.  01/03/15   Lafayette Dragon, MD  Ubrogepant (UBRELVY) 50 MG TABS Take 50-100 mg by mouth every 2 (two) hours as needed. Max 200mg  a day 09/03/19   Melvenia Beam, MD    Allergies    Erythromycin, Ezetimibe, Other, Rofecoxib, Statins, Zonisamide, and Nsaids  Review of Systems   Review of Systems  Constitutional: Negative for fever.  Respiratory: Negative for shortness of breath.   Cardiovascular: Negative for chest pain.  Gastrointestinal: Positive for abdominal pain, nausea and vomiting. Negative for constipation and diarrhea.  Genitourinary: Negative for dysuria and hematuria.  All other systems reviewed and are negative.   Physical Exam Updated Vital Signs BP (!) 135/100 (BP Location: Right Arm)   Pulse 99   Temp 98.9 F (37.2 C) (Oral)   Resp (!) 24   Ht 1.626 m (5\' 4" )   Wt 91.1 kg   SpO2 100%   BMI 34.47 kg/m   Physical Exam Vitals and nursing note reviewed.  Constitutional:      Appearance: She is well-developed. She is obese. She is not ill-appearing.  HENT:     Head: Normocephalic  and atraumatic.  Eyes:     Pupils: Pupils are equal, round, and reactive to light.  Cardiovascular:     Rate and Rhythm: Normal rate and regular rhythm.     Heart sounds: Normal heart sounds.  Pulmonary:     Effort: Pulmonary effort is normal. No respiratory distress.     Breath sounds: No wheezing.  Abdominal:     General: Bowel sounds are normal.     Palpations: Abdomen is soft.     Tenderness: There is abdominal tenderness in the left upper quadrant. There is no guarding or rebound.  Comments: Abdominal scarring noted, well-healed  Musculoskeletal:     Cervical back: Neck supple.  Skin:    General: Skin is warm and dry.  Neurological:     Mental Status: She is alert and oriented to person, place, and time.  Psychiatric:        Mood and Affect: Mood normal.     ED Results / Procedures / Treatments   Labs (all labs ordered are listed, but only abnormal results are displayed) Labs Reviewed  COMPREHENSIVE METABOLIC PANEL - Abnormal; Notable for the following components:      Result Value   Potassium 3.4 (*)    Glucose, Bld 108 (*)    BUN 21 (*)    Creatinine, Ser 1.17 (*)    GFR calc non Af Amer 51 (*)    GFR calc Af Amer 59 (*)    All other components within normal limits  LIPASE, BLOOD  CBC  URINALYSIS, ROUTINE W REFLEX MICROSCOPIC  PREGNANCY, URINE    EKG None  Radiology CT ABDOMEN PELVIS W CONTRAST  Result Date: 11/21/2019 CLINICAL DATA:  Left upper quadrant abdominal pain EXAM: CT ABDOMEN AND PELVIS WITH CONTRAST TECHNIQUE: Multidetector CT imaging of the abdomen and pelvis was performed using the standard protocol following bolus administration of intravenous contrast. CONTRAST:  152mL OMNIPAQUE IOHEXOL 300 MG/ML  SOLN COMPARISON:  None. FINDINGS: Lower chest: The visualized heart size within normal limits. No pericardial fluid/thickening. No hiatal hernia. The visualized portions of the lungs are clear. Hepatobiliary: Again noted are postsurgical changes  from a prior liver transplant with surgical clips in the posterior right liver. There appears to be a mildly dilated native common bile duct measuring up to 1.2 cm to the level of the ampulla as on prior exam dating back to 2013. No choledocholithiasis is noted. Pancreas: Unremarkable. No pancreatic ductal dilatation or surrounding inflammatory changes. Spleen: Normal in size without focal abnormality. Adrenals/Urinary Tract: Both adrenal glands appear normal. There is a 1 cm low-density lesion seen within the left kidney. No renal or collecting system calculi. No hydronephrosis. Bladder is unremarkable. Stomach/Bowel: The stomach, small bowel, and colon are normal in appearance. No inflammatory changes, wall thickening, or obstructive findings.Scattered colonic diverticula are noted without diverticulitis. The appendix is unremarkable. Vascular/Lymphatic: There are no enlarged mesenteric, retroperitoneal, or pelvic lymph nodes. Scattered aortic atherosclerotic calcifications are seen without aneurysmal dilatation. Reproductive: The patient is status post hysterectomy. No adnexal masses or collections seen. Other: A small fat containing anterior umbilical hernia is noted. Musculoskeletal: No acute or significant osseous findings. IMPRESSION: Postsurgical changes from prior liver transplant and stable mild dilatation of the native common bile duct. No definite choledocholithiasis. Diverticulosis without diverticulitis. Aortic Atherosclerosis (ICD10-I70.0). Electronically Signed   By: Prudencio Pair M.D.   On: 11/21/2019 00:12    Procedures Procedures (including critical care time)  Medications Ordered in ED Medications  sodium chloride flush (NS) 0.9 % injection 3 mL (has no administration in time range)  morphine 4 MG/ML injection 4 mg (4 mg Intravenous Given 11/20/19 2331)  ondansetron (ZOFRAN) injection 4 mg (4 mg Intravenous Given 11/20/19 2330)  iohexol (OMNIPAQUE) 300 MG/ML solution 100 mL (100 mLs  Intravenous Contrast Given 11/20/19 2354)    ED Course  I have reviewed the triage vital signs and the nursing notes.  Pertinent labs & imaging results that were available during my care of the patient were reviewed by me and considered in my medical decision making (see chart for details).    MDM  Rules/Calculators/A&P                           Patient presents with abdominal pain and vomiting.  She is overall nontoxic and vital signs are largely reassuring.  She has some left upper quadrant tenderness palpation.  No rebound or guarding.  No signs of peritonitis.  Considerations include but not limited to diverticulitis, splenic pathology, gastritis, gastroenteritis, kidney stone.  Less likely cholecystitis or appendicitis given location of pain.  Patient was given pain and nausea medication.  Lab work reviewed and largely reassuring.  No significant leukocytosis.  Lipase is normal.  CT scan of the abdomen obtained and shows no evidence of acute diverticulitis or other significant intra-abdominal pathology.  On recheck, patient reports she has some pain but is able to find position of comfort.  Would consider musculoskeletal etiology as well.  Recommend supportive measures.  Patient will be discharged with Tylenol and Zofran.  She was given strict return precautions.  After history, exam, and medical workup I feel the patient has been appropriately medically screened and is safe for discharge home. Pertinent diagnoses were discussed with the patient. Patient was given return precautions.   Final Clinical Impression(s) / ED Diagnoses Final diagnoses:  LUQ pain    Rx / DC Orders ED Discharge Orders         Ordered    ondansetron (ZOFRAN ODT) 4 MG disintegrating tablet  Every 8 hours PRN     Discontinue  Reprint     11/21/19 0148    acetaminophen (TYLENOL) 500 MG tablet  Every 6 hours PRN     Discontinue  Reprint     11/21/19 0148           Merryl Hacker, MD 11/21/19 0150

## 2019-11-21 NOTE — ED Notes (Signed)
Pt given ginger ale per po challenge

## 2019-11-21 NOTE — Discharge Instructions (Signed)
You were seen today for abdominal pain.  Your work-up including CT scan is reassuring.  Take medications as prescribed.  Make sure that you are staying well-hydrated.  If you have worsening symptoms, you should be reevaluated.  Some of your symptoms may be related to a GI virus or gastroenteritis.  Again hydration is very important.

## 2019-11-23 ENCOUNTER — Encounter: Payer: Self-pay | Admitting: Family Medicine

## 2019-11-23 ENCOUNTER — Other Ambulatory Visit: Payer: Self-pay

## 2019-11-23 ENCOUNTER — Ambulatory Visit (INDEPENDENT_AMBULATORY_CARE_PROVIDER_SITE_OTHER): Payer: Medicare Other | Admitting: Family Medicine

## 2019-11-23 VITALS — BP 118/62 | HR 78 | Temp 97.8°F | Resp 12 | Ht 64.0 in | Wt 198.0 lb

## 2019-11-23 DIAGNOSIS — R0789 Other chest pain: Secondary | ICD-10-CM

## 2019-11-23 DIAGNOSIS — R1012 Left upper quadrant pain: Secondary | ICD-10-CM | POA: Diagnosis not present

## 2019-11-23 DIAGNOSIS — R142 Eructation: Secondary | ICD-10-CM

## 2019-11-23 DIAGNOSIS — M797 Fibromyalgia: Secondary | ICD-10-CM | POA: Diagnosis not present

## 2019-11-23 MED ORDER — TRAMADOL HCL 50 MG PO TABS: 50.0000 mg | ORAL_TABLET | Freq: Four times a day (QID) | ORAL | 0 refills | Status: DC | PRN

## 2019-11-23 MED ORDER — ESOMEPRAZOLE MAGNESIUM 20 MG PO CPDR: 20.0000 mg | DELAYED_RELEASE_CAPSULE | Freq: Every day | ORAL | 2 refills | Status: DC

## 2019-11-23 NOTE — Patient Instructions (Signed)
Please follow up as scheduled for your next visit with me: 02/22/2020   You may use the tramadol to help with your discomfort and take the nexium daily to help with gastritis if it is present.  Some of your pain may be due to fibromyalgia.   If things worsen, return.   If you have any questions or concerns, please don't hesitate to send me a message via MyChart or call the office at [phone]. Thank you for visiting with Cassandra [name] today! It's our pleasure caring for you.

## 2019-11-23 NOTE — Progress Notes (Signed)
Subjective  CC:  Chief Complaint  Patient presents with  . Follow-up    ED for abdominal pain   . Abdominal Pain    LUQ can get to 8/10 off and on depending on how she is sitting     HPI: Cassandra [name] is a 59 y.o. female who presents to the office today to address the problems listed above in the chief complaint.  I reviewed 6/18 ER evaluation and labs/abd/pelvic CT results. Acute on set LUQ pain constant and nagging although worsens in certain positions. ER eval was unremarkable: had nausea and discomfort with some ttp on exam. Labs were unremarkable as was abd CT scan. She responded to zofran and was discharged. No significant changes since discharge. Pain persists but nausea is improved. No diarrhea but reports h/o intermittent constipation. No melena. Appetite is down and she is belching more than usual. No hematemesis. No f/c/s. No injury or strain. Using tylenol prn with little relief.    Assessment  1. LUQ abdominal pain   2. Left-sided chest wall pain   3. Fibromyalgia   4. Belching      Plan   LUQ pain and left chest wall pain:  Unclear source but fibromyalgia could be playing a role. Benign abdomen. Trial of nexium, tramadol and monitor sxs over the next week. To return if persists or worsen.   Follow up: Return for as scheduled.  02/22/2020  No orders of the defined types were placed in this encounter.  Meds ordered this encounter  Medications  . traMADol (ULTRAM) 50 MG tablet    Sig: Take 1 tablet (50 mg total) by mouth every 6 (six) hours as needed for moderate pain.    Dispense:  30 tablet    Refill:  0  . esomeprazole (NEXIUM) 20 MG capsule    Sig: Take 1 capsule (20 mg total) by mouth daily.    Dispense:  30 capsule    Refill:  2      I reviewed the patients updated PMH, FH, and SocHx.    Patient Active Problem List   Diagnosis Date Noted  . Statin intolerance 05/13/2018    Priority: High  . Primary insomnia 11/04/2017    Priority: High  .  Immunosuppressed status ([place]) 10/07/2017    Priority: High  . Adenomatous polyp of colon 09/28/2017    Priority: High  . Mixed hyperlipidemia 03/10/2013    Priority: High  . Primary osteoarthritis of knees, bilateral 03/01/2013    Priority: High  . Chronic back pain 10/13/2012    Priority: High  . Impaired glucose tolerance 12/13/2011    Priority: High  . History of liver transplant ([place]) 02/22/2009    Priority: High  . Cervical stenosis of spine 01/25/2017    Priority: Medium  . Fibromyalgia 08/24/2013    Priority: Medium  . Hx of tuberculosis 10/13/2012    Priority: Medium  . Anxiety disorder 09/16/2007    Priority: Medium  . AS (sickle cell trait) ([place]) 06/29/2019  . Migraine with aura and without status migrainosus, not intractable 07/03/2018  . Cervical facet joint syndrome 03/28/2018  . Spondylosis of cervical region without myelopathy or radiculopathy 03/28/2018  . Lumbar facet arthropathy 12/27/2017   Current Meds  Medication Sig  . acetaminophen (TYLENOL) 500 MG tablet Take 1 tablet (500 mg total) by mouth every 6 (six) hours as needed.  . cyclobenzaprine (FLEXERIL) 10 MG tablet Take 1 tablet (10 mg total) by mouth 3 (three) times daily as  needed. (Patient taking differently: Take 10 mg by mouth as needed. )  . fluticasone (FLONASE) 50 MCG/ACT nasal spray Place 2 sprays into both nostrils daily.  . mycophenolate (CELLCEPT) 500 MG tablet Take 500 mg by mouth 2 (two) times daily.  Marland Kitchen olopatadine (PATANOL) 0.1 % ophthalmic solution Place 1 drop into both eyes 2 (two) times daily.  . Omega-3 Fatty Acids (FISH OIL) 1000 MG CAPS Take 1 capsule by mouth daily.  . ondansetron (ZOFRAN ODT) 4 MG disintegrating tablet Take 1 tablet (4 mg total) by mouth every 8 (eight) hours as needed.  . PredniSONE (DELTASONE PO) Take 5 mg by mouth daily.   . Rimegepant Sulfate (NURTEC) 75 MG TBDP Take 75 mg by mouth daily as needed. For migraines. Take as close to onset of migraine as possible.  One daily maximum.  . tacrolimus (PROGRAF) 1 MG capsule Take 1 capsule (1 mg total) by mouth 2 (two) times daily. (Patient taking differently: Take 2 mg by mouth 2 (two) times daily. )  . Ubrogepant (UBRELVY) 50 MG TABS Take 50-100 mg by mouth every 2 (two) hours as needed. Max 200mg  a day    Allergies: Patient is allergic to erythromycin, ezetimibe, other, rofecoxib, statins, zonisamide, and nsaids. Family History: Patient family history includes Alzheimer's disease in her mother; Cancer in her sister; Dementia in her mother; Diabetes in her brother and mother; Gout in her mother; Heart disease in her sister; Hypertension in her mother and sister; Stroke in her mother. Social History:  Patient  reports that she has never smoked. She has never used smokeless tobacco. She reports that she does not drink alcohol and does not use drugs.  Review of Systems: Constitutional: Negative for fever malaise or anorexia Cardiovascular: negative for chest pain Respiratory: negative for SOB or persistent cough Gastrointestinal: negative for abdominal pain  Objective  Vitals: BP 118/62   Pulse 78   Temp 97.8 F (36.6 C) (Temporal)   Resp 12   Ht 5\' 4"  (1.626 m)   Wt 198 lb (89.8 kg)   BMI 33.99 kg/m  General: no acute distress , A&Ox3, appears well HEENT: PEERL, conjunctiva normal, neck is supple Cardiovascular:  RRR without murmur or gallop.  Respiratory:  Good breath sounds bilaterally, CTAB with normal respiratory effort. Left lower ribs and chest wall ttp Gastrointestinal: soft, flat abdomen, normal active bowel sounds, LUQ ttp w/o rebound or guarding, no palpable masses, no hepatosplenomegaly, no appreciated hernias Skin:  Warm, no rashes     Commons side effects, risks, benefits, and alternatives for medications and treatment plan prescribed today were discussed, and the patient expressed understanding of the given instructions. Patient is instructed to call or message via MyChart if  he/she has any questions or concerns regarding our treatment plan. No barriers to understanding were identified. We discussed Red Flag symptoms and signs in detail. Patient expressed understanding regarding what to do in case of urgent or emergency type symptoms.   Medication list was reconciled, printed and provided to the patient in AVS. Patient instructions and summary information was reviewed with the patient as documented in the AVS. This note was prepared with assistance of Dragon voice recognition software. Occasional wrong-word or sound-a-like substitutions may have occurred due to the inherent limitations of voice recognition software  This visit occurred during the SARS-CoV-2 public health emergency.  Safety protocols were in place, including screening questions prior to the visit, additional usage of staff PPE, and extensive cleaning of exam room while observing appropriate contact  time as indicated for disinfecting solutions.

## 2019-12-04 ENCOUNTER — Telehealth (INDEPENDENT_AMBULATORY_CARE_PROVIDER_SITE_OTHER): Payer: Medicare Other | Admitting: Family Medicine

## 2019-12-04 ENCOUNTER — Other Ambulatory Visit: Payer: Self-pay

## 2019-12-04 ENCOUNTER — Encounter: Payer: Self-pay | Admitting: Family Medicine

## 2019-12-04 VITALS — Ht 64.0 in | Wt 198.0 lb

## 2019-12-04 DIAGNOSIS — H1132 Conjunctival hemorrhage, left eye: Secondary | ICD-10-CM | POA: Diagnosis not present

## 2019-12-04 NOTE — Progress Notes (Signed)
Virtual Visit via Video Note  Subjective  CC:  Chief Complaint  Patient presents with  . Eye Pain    Left eye red and tender to touch. started about two days ago. has had some improvement in symptoms. denies any injury or visiton chagnes.      I connected with Cassandra [name] on 12/04/19 at 11:00 AM EDT by a video enabled telemedicine application and verified that I am speaking with the correct person using two identifiers. Location patient: Home Location provider: [facility], Office Persons participating in the virtual visit: Cassandra [name], Leamon Arnt, MD Reymundo Poll CMA  I discussed the limitations of evaluation and management by telemedicine and the availability of in person appointments. The patient expressed understanding and agreed to proceed. HPI:    Patient c/o redness in the lower part of her left eye about 2 days ago.  She admits it may be was mildly sore at that time but has no pain now.  It is is improving.  No recent trauma.  No recent vomiting or straining.  No URI symptoms.  No drainage.  No visual changes. Assessment  1. Subconjunctival hemorrhage of left eye      Plan   Subconjunctival hemorrhage: Reassured.  Benign.  Improving.  Education given  Follow up: As scheduled 02/22/2020 The patient was advised to call back or seek an in-person evaluation if the symptoms worsen or if the condition fails to improve as anticipated.  No orders of the defined types were placed in this encounter.     I reviewed the patients updated PMH, FH, and SocHx.    Patient Active Problem List   Diagnosis Date Noted  . Statin intolerance 05/13/2018    Priority: High  . Primary insomnia 11/04/2017    Priority: High  . Immunosuppressed status ([place]) 10/07/2017    Priority: High  . Adenomatous polyp of colon 09/28/2017    Priority: High  . Mixed hyperlipidemia 03/10/2013    Priority: High  . Primary osteoarthritis of knees,  bilateral 03/01/2013    Priority: High  . Chronic back pain 10/13/2012    Priority: High  . Impaired glucose tolerance 12/13/2011    Priority: High  . History of liver transplant ([place]) 02/22/2009    Priority: High  . Cervical stenosis of spine 01/25/2017    Priority: Medium  . Fibromyalgia 08/24/2013    Priority: Medium  . Hx of tuberculosis 10/13/2012    Priority: Medium  . Anxiety disorder 09/16/2007    Priority: Medium  . AS (sickle cell trait) ([place]) 06/29/2019  . Migraine with aura and without status migrainosus, not intractable 07/03/2018  . Cervical facet joint syndrome 03/28/2018  . Spondylosis of cervical region without myelopathy or radiculopathy 03/28/2018  . Lumbar facet arthropathy 12/27/2017   Current Meds  Medication Sig  . acetaminophen (TYLENOL) 500 MG tablet Take 1 tablet (500 mg total) by mouth every 6 (six) hours as needed.  . cyclobenzaprine (FLEXERIL) 10 MG tablet Take 1 tablet (10 mg total) by mouth 3 (three) times daily as needed. (Patient taking differently: Take 10 mg by mouth as needed. )  . esomeprazole (NEXIUM) 20 MG capsule Take 1 capsule (20 mg total) by mouth daily.  . fluticasone (FLONASE) 50 MCG/ACT nasal spray Place 2 sprays into both nostrils daily.  . mycophenolate (CELLCEPT) 500 MG tablet Take 500 mg by mouth 2 (two) times daily.  Marland Kitchen olopatadine (PATANOL) 0.1 %  ophthalmic solution Place 1 drop into both eyes 2 (two) times daily.  . Omega-3 Fatty Acids (FISH OIL) 1000 MG CAPS Take 1 capsule by mouth daily.  . PredniSONE (DELTASONE PO) Take 5 mg by mouth daily.   . tacrolimus (PROGRAF) 1 MG capsule Take 1 capsule (1 mg total) by mouth 2 (two) times daily. (Patient taking differently: Take 2 mg by mouth 2 (two) times daily. )  . traMADol (ULTRAM) 50 MG tablet Take 1 tablet (50 mg total) by mouth every 6 (six) hours as needed for moderate pain.  Marland Kitchen Ubrogepant (UBRELVY) 50 MG TABS Take 50-100 mg by mouth every 2 (two) hours as needed. Max 200mg  a day     Allergies: Patient is allergic to erythromycin, ezetimibe, other, rofecoxib, statins, zonisamide, and nsaids. Family History: Patient family history includes Alzheimer's disease in her mother; Cancer in her sister; Dementia in her mother; Diabetes in her brother and mother; Gout in her mother; Heart disease in her sister; Hypertension in her mother and sister; Stroke in her mother. Social History:  Patient  reports that she has never smoked. She has never used smokeless tobacco. She reports that she does not drink alcohol and does not use drugs.  Review of Systems: Constitutional: Negative for fever malaise or anorexia Cardiovascular: negative for chest pain Respiratory: negative for SOB or persistent cough Gastrointestinal: negative for abdominal pain  OBJECTIVE Vitals: Ht 5\' 4"  (1.626 m)   Wt 198 lb (89.8 kg)   BMI 33.99 kg/m  General: no acute distress , A&Ox3 Left lower some conjunctival hemorrhage present  Leamon Arnt, MD

## 2020-02-15 ENCOUNTER — Other Ambulatory Visit: Payer: Self-pay | Admitting: Family Medicine

## 2020-02-22 ENCOUNTER — Encounter: Payer: Self-pay | Admitting: Family Medicine

## 2020-02-22 ENCOUNTER — Other Ambulatory Visit: Payer: Self-pay

## 2020-02-22 ENCOUNTER — Ambulatory Visit (INDEPENDENT_AMBULATORY_CARE_PROVIDER_SITE_OTHER): Payer: Medicare Other | Admitting: Family Medicine

## 2020-02-22 VITALS — BP 142/100 | HR 72 | Temp 98.0°F | Resp 16 | Ht 64.0 in | Wt 200.4 lb

## 2020-02-22 DIAGNOSIS — M545 Low back pain, unspecified: Secondary | ICD-10-CM

## 2020-02-22 DIAGNOSIS — R7301 Impaired fasting glucose: Secondary | ICD-10-CM | POA: Diagnosis not present

## 2020-02-22 DIAGNOSIS — Z0000 Encounter for general adult medical examination without abnormal findings: Secondary | ICD-10-CM

## 2020-02-22 DIAGNOSIS — M797 Fibromyalgia: Secondary | ICD-10-CM | POA: Diagnosis not present

## 2020-02-22 DIAGNOSIS — Z944 Liver transplant status: Secondary | ICD-10-CM

## 2020-02-22 DIAGNOSIS — D849 Immunodeficiency, unspecified: Secondary | ICD-10-CM | POA: Diagnosis not present

## 2020-02-22 DIAGNOSIS — M170 Bilateral primary osteoarthritis of knee: Secondary | ICD-10-CM

## 2020-02-22 DIAGNOSIS — G8929 Other chronic pain: Secondary | ICD-10-CM

## 2020-02-22 DIAGNOSIS — E782 Mixed hyperlipidemia: Secondary | ICD-10-CM

## 2020-02-22 MED ORDER — TACROLIMUS 1 MG PO CAPS
2.0000 mg | ORAL_CAPSULE | Freq: Two times a day (BID) | ORAL | Status: AC
Start: 2020-02-22 — End: ?

## 2020-02-22 MED ORDER — DICLOFENAC SODIUM 1 % EX GEL: 4.0000 g | Freq: Four times a day (QID) | CUTANEOUS | 3 refills | Status: DC | PRN

## 2020-02-22 NOTE — Patient Instructions (Signed)
Please return in 6 months for recheck.   Return if your knee worsens.   If you have any questions or concerns, please don't hesitate to send me a message via MyChart or call the office at [phone]. Thank you for visiting with [place] today! It's our pleasure caring for you.

## 2020-02-22 NOTE — Progress Notes (Signed)
Subjective  Chief Complaint  Patient presents with  . Annual Exam    fasting  . Knee Pain    right    HPI: Cassandra [name] is a 59 y.o. female who presents to [facility] today for a Female Wellness Visit. She also has the concerns and/or needs as listed above in the chief complaint. These will be addressed in addition to the Health Maintenance Visit.   Wellness Visit: annual visit with health maintenance review and exam without Pap   HM: Mammogram doing December.  Other screens are up-to-date.  Overall she continues to do fairly well.  She did have an abdominal illness in June and I reviewed those labs.  Thought to be viral in nature.  LFTs were elevated but follow-up labs in August by her transplant team had normalized.  She feels well now.  She does admit to intermittent abdominal bloating.  She declines a flu shot.  Chronic disease f/u and/or acute problem visit: (deemed necessary to be done in addition to the wellness visit):  Immunosuppressed status post liver transplant 29 years ago: Continues to do well and follows up with [facility] transplant team.  I recommend Covid booster vaccination when eligible  Knee osteoarthritis: Current flare.  Believes elevation in blood pressures due to pain.  Mild swelling.  No warmth or stiffness.  Chronic end-stage but she is not a good surgical candidate  Hyperlipidemia and fibromyalgia and chronic back pain: Noninteractive.  Recheck fasting labs today  Recheck A1c given history of impaired fasting glucose.  She denies symptoms of hyperglycemia.  Assessment  1. Annual physical exam   2. Immunosuppressed status ([place])   3. Mixed hyperlipidemia   4. Impaired fasting glucose   5. Fibromyalgia   6. History of liver transplant ([place])   7. Chronic midline low back pain without sciatica   8. Primary osteoarthritis of knees, bilateral      Plan  Female Wellness Visit:  Age appropriate Health Maintenance and Prevention  measures were discussed with patient. Included topics are cancer screening recommendations, ways to keep healthy (see AVS) including dietary and exercise recommendations, regular eye and dental care, use of seat belts, and avoidance of moderate alcohol use and tobacco use.   BMI: discussed patient's BMI and encouraged positive lifestyle modifications to help get to or maintain a target BMI.  HM needs and immunizations were addressed and ordered. See below for orders. See HM and immunization section for updates.  Routine labs and screening tests ordered including cmp, cbc and lipids where appropriate.  Discussed recommendations regarding Vit D and calcium supplementation (see AVS)  Chronic disease management visit and/or acute problem visit:  History of liver transplant on chronic prednisone and immunosuppressants, stable  Recheck liver test, lipids A1c.  No changes in medications today.  Knee osteoarthritis: Mild effusion present.  No significant erythema or warmth.  Crepitus persist.  Trial of ice and Voltaren gel.  If worsens, can return for steroid injection aspiration.  For myalgia and chronic pain are well controlled currently.  Follow up: No follow-ups on file.  Orders Placed This Encounter  Procedures  . COMPLETE METABOLIC PANEL WITH GFR  . Lipid panel  . TSH  . Hemoglobin A1c   Meds ordered this encounter  Medications  . tacrolimus (PROGRAF) 1 MG capsule    Sig: Take 2 capsules (2 mg total) by mouth 2 (two) times daily.      Lifestyle: Body mass index is 34.4 kg/m. Wt  Readings from Last 3 Encounters:  02/22/20 200 lb 6.4 oz (90.9 kg)  12/04/19 198 lb (89.8 kg)  11/23/19 198 lb (89.8 kg)    Patient Active Problem List   Diagnosis Date Noted  . Statin intolerance 05/13/2018    Priority: High  . Primary insomnia 11/04/2017    Priority: High  . Immunosuppressed status ([place]) 10/07/2017    Priority: High  . Adenomatous polyp of colon 09/28/2017    Priority: High     Per [place] [facility] found in 2009; followed up in 2011.   . Mixed hyperlipidemia 03/10/2013    Priority: High  . Primary osteoarthritis of knees, bilateral 03/01/2013    Priority: High    Dr. Michaelle Birks saw in 08/2016 for pain and was considering Synvisc.  High risk for total knee due to immune suppressant post liver transplant. [facility] ortho 2019: severe OA right knee with recurrent effusions. S/p synvisc w/o relief. Holding off on replacement.    . Chronic back pain 10/13/2012    Priority: High  . Impaired glucose tolerance 12/13/2011    Priority: High  . History of liver transplant ([place]) 02/22/2009    Priority: High    [facility]:due to AIH with cirrhosis.  1992.    . Migraine with aura and without status migrainosus, not intractable 07/03/2018    Priority: Medium  . Cervical stenosis of spine 01/25/2017    Priority: Medium  . Fibromyalgia 08/24/2013    Priority: Medium  . Hx of tuberculosis 10/13/2012    Priority: Medium  . Anxiety disorder 09/16/2007    Priority: Medium    Has failed prozac, elavil, zoloft in past.  Used nightly xanax since 2000; weaned 2019.    . AS (sickle cell trait) ([place]) 06/29/2019    Priority: Low  . Cervical facet joint syndrome 03/28/2018  . Spondylosis of cervical region without myelopathy or radiculopathy 03/28/2018  . Lumbar facet arthropathy 12/27/2017   Health Maintenance  Topic Date Due  . MAMMOGRAM  05/25/2020  . COLONOSCOPY  10/13/2020  . TETANUS/TDAP  02/02/2021  . COVID-19 Vaccine  Completed  . Hepatitis C Screening  Completed  . HIV Screening  Completed  . INFLUENZA VACCINE  Discontinued   Immunization History  Administered Date(s) Administered  . Hepatitis A, Adult 09/27/2016, 09/26/2017  . Influenza Split 03/05/2012  . Influenza, Quadrivalent, Recombinant, Inj, Pf 04/29/2013  . Influenza, Seasonal, Injecte, Preservative Fre 03/02/2014, 02/28/2015  . Influenza,inj,Quad PF,6+ Mos 03/28/2016  . Influenza-Unspecified 05/26/2013,  03/28/2016  . PFIZER SARS-COV-2 Vaccination 08/27/2019, 09/17/2019  . Pneumococcal Conjugate-13 09/22/2015  . Pneumococcal Polysaccharide-23 09/27/2016  . Tdap 05/17/2008, 12/03/2010   We updated and reviewed the patient's past history in detail and it is documented below. Allergies: Patient is allergic to erythromycin, ezetimibe, other, rofecoxib, statins, zonisamide, and nsaids. Past Medical History Patient  has a past medical history of Anxiety, AS (sickle cell trait) ([place]) (06/29/2019), Autoimmune hepatitis ([place]), Chronic renal insufficiency, Cirrhosis ([place]), COLONIC POLYPS, ADENOMATOUS, HX OF (05/05/2008), Depression, External hemorrhoids, Fibromyalgia, GERD (gastroesophageal reflux disease), Hyperlipidemia, Internal hemorrhoids, Iron deficiency anemia, Liver replaced by transplant ([place]) (02/22/2009), Migraine, 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, Ruptured disc, cervical, and Statin intolerance (05/13/2018). Past Surgical History Patient  has a past surgical history that includes Liver transplant (1992); Knee arthroscopy (Right, 2010); Rotator cuff repair (Left, 2010); and Abdominal hysterectomy (2010). Family History: Patient family history includes Alzheimer's disease in her mother; Cancer in her sister; Dementia in her mother; Diabetes in her brother and mother; Gout in her mother;  Heart disease in her sister; Hypertension in her mother and sister; Stroke in her mother. Social History:  Patient  reports that she has never smoked. She has never used smokeless tobacco. She reports that she does not drink alcohol and does not use drugs.  Review of Systems: Constitutional: negative for fever or malaise Ophthalmic: negative for photophobia, double vision or loss of vision Cardiovascular: negative for chest pain, dyspnea on exertion, or new LE swelling Respiratory: negative for SOB or persistent cough Gastrointestinal: negative for abdominal pain, change in bowel habits or  melena Genitourinary: negative for dysuria or gross hematuria, no abnormal uterine bleeding or disharge Musculoskeletal: negative for new gait disturbance or muscular weakness Integumentary: negative for new or persistent rashes, no breast lumps Neurological: negative for TIA or stroke symptoms Psychiatric: negative for SI or delusions Allergic/Immunologic: negative for hives  Patient Care Team    Relationship Specialty Notifications Start End  Leamon Arnt, MD PCP - General Family Medicine  10/07/17   Mauri Pole, MD Consulting Physician Gastroenterology  11/04/17   Lavonia Drafts  Pain Medicine  11/04/17   Melvenia Beam, MD Consulting Physician Neurology  05/22/19   Teena Irani, MD Consulting Physician Internal Medicine  05/22/19   Lambert Keto, PA-C  Physician Assistant  05/22/19     Objective  Vitals: BP (!) 142/100   Pulse 72   Temp 98 F (36.7 C) (Temporal)   Resp 16   Ht 5\' 4"  (1.626 m)   Wt 200 lb 6.4 oz (90.9 kg)   SpO2 97%   BMI 34.40 kg/m  General:  Well developed, well nourished, no acute distress  Psych:  Alert and orientedx3,normal mood and affect HEENT:  Normocephalic, atraumatic, non-icteric sclera,  supple neck without adenopathy, mass or thyromegaly Cardiovascular:  Normal S1, S2, RRR without gallop, rub or murmur Respiratory:  Good breath sounds bilaterally, CTAB with normal respiratory effort Gastrointestinal: normal bowel sounds, soft, non-tender, no noted masses. No HSM MSK: no deformities, contusions.  Right knee with small effusion, no warmth or redness, crepitus present, full range of motion Skin:  Warm, no rashes or suspicious lesions noted Neurologic:    Mental status is normal.  Gross motor and sensory exams are normal. Normal gait. No tremor    Commons side effects, risks, benefits, and alternatives for medications and treatment plan prescribed today were discussed, and the patient expressed understanding of the given instructions.  Patient is instructed to call or message via MyChart if he/she has any questions or concerns regarding our treatment plan. No barriers to understanding were identified. We discussed Red Flag symptoms and signs in detail. Patient expressed understanding regarding what to do in case of urgent or emergency type symptoms.   Medication list was reconciled, printed and provided to the patient in AVS. Patient instructions and summary information was reviewed with the patient as documented in the AVS. This note was prepared with assistance of Dragon voice recognition software. Occasional wrong-word or sound-a-like substitutions may have occurred due to the inherent limitations of voice recognition software  This visit occurred during the SARS-CoV-2 public health emergency.  Safety protocols were in place, including screening questions prior to the visit, additional usage of staff PPE, and extensive cleaning of exam room while observing appropriate contact time as indicated for disinfecting solutions.

## 2020-02-23 LAB — COMPLETE METABOLIC PANEL WITH GFR
AG Ratio: 1.7 (calc) (ref 1.0–2.5)
ALT: 16 U/L (ref 6–29)
AST: 14 U/L (ref 10–35)
Albumin: 4.1 g/dL (ref 3.6–5.1)
Alkaline phosphatase (APISO): 90 U/L (ref 37–153)
BUN/Creatinine Ratio: 14 (calc) (ref 6–22)
BUN: 17 mg/dL (ref 7–25)
CO2: 22 mmol/L (ref 20–32)
Calcium: 9 mg/dL (ref 8.6–10.4)
Chloride: 107 mmol/L (ref 98–110)
Creat: 1.24 mg/dL — ABNORMAL HIGH (ref 0.50–1.05)
GFR, Est African American: 55 mL/min/{1.73_m2} — ABNORMAL LOW (ref >=60)
GFR, Est Non African American: 48 mL/min/{1.73_m2} — ABNORMAL LOW (ref >=60)
Globulin: 2.4 g/dL (calc) (ref 1.9–3.7)
Glucose, Bld: 135 mg/dL — ABNORMAL HIGH (ref 65–99)
Potassium: 3.9 mmol/L (ref 3.5–5.3)
Sodium: 138 mmol/L (ref 135–146)
Total Bilirubin: 0.4 mg/dL (ref 0.2–1.2)
Total Protein: 6.5 g/dL (ref 6.1–8.1)

## 2020-02-23 LAB — LIPID PANEL
Cholesterol: 273 mg/dL — ABNORMAL HIGH (ref <200)
HDL: 46 mg/dL — ABNORMAL LOW (ref >=50)
LDL Cholesterol (Calc): 189 mg/dL (calc) — ABNORMAL HIGH
Non-HDL Cholesterol (Calc): 227 mg/dL (calc) — ABNORMAL HIGH (ref <130)
Total CHOL/HDL Ratio: 5.9 (calc) — ABNORMAL HIGH (ref <5.0)
Triglycerides: 203 mg/dL — ABNORMAL HIGH (ref <150)

## 2020-02-23 LAB — TSH: TSH: 0.98 mIU/L (ref 0.40–4.50)

## 2020-02-23 LAB — HEMOGLOBIN A1C
Hgb A1c MFr Bld: 6.3 % of total Hgb — ABNORMAL HIGH (ref <5.7)
Mean Plasma Glucose: 134 (calc)
eAG (mmol/L): 7.4 (calc)

## 2020-02-24 ENCOUNTER — Other Ambulatory Visit: Payer: Self-pay

## 2020-02-24 MED ORDER — METFORMIN HCL 500 MG PO TABS
500.0000 mg | ORAL_TABLET | Freq: Two times a day (BID) | ORAL | 3 refills | Status: DC
Start: 2020-02-24 — End: 2020-06-06

## 2020-02-24 NOTE — Progress Notes (Signed)
Please call patient: I have reviewed his/her lab results. Most labs are ok including liver, however sugars remain elevated and are very near the diabetic range. Cholesterol also remains high.  I recommend starting medication to lower sugars: metformin 500 bid. If willing, please order. Will recheck at next visit.

## 2020-03-18 ENCOUNTER — Ambulatory Visit: Payer: Medicare Other | Admitting: Family Medicine

## 2020-03-24 ENCOUNTER — Ambulatory Visit (INDEPENDENT_AMBULATORY_CARE_PROVIDER_SITE_OTHER): Payer: Medicare Other | Admitting: Family Medicine

## 2020-03-24 ENCOUNTER — Encounter: Payer: Self-pay | Admitting: Family Medicine

## 2020-03-24 ENCOUNTER — Other Ambulatory Visit: Payer: Self-pay

## 2020-03-24 VITALS — BP 140/86 | HR 87 | Temp 98.1°F | Ht 64.0 in | Wt 202.0 lb

## 2020-03-24 DIAGNOSIS — L814 Other melanin hyperpigmentation: Secondary | ICD-10-CM | POA: Diagnosis not present

## 2020-03-24 NOTE — Progress Notes (Signed)
Subjective  CC:  Chief Complaint  Patient presents with  . Nevus    mainly on right leg, front of shin    HPI: Cassandra [name] is a 59 y.o. female who presents to the office today to address the problems listed above in the chief complaint.  Patient has history of asbestosis exposure.  Has several moles on her legs that she would like checked.  No history of melanoma. Assessment  1. Lentigo      Plan   lentigo:  Reassured. Benign appearing  Follow up:   08/24/2020  No orders of the defined types were placed in this encounter.  No orders of the defined types were placed in this encounter.     I reviewed the patients updated PMH, FH, and SocHx.    Patient Active Problem List   Diagnosis Date Noted  . Statin intolerance 05/13/2018    Priority: High  . Primary insomnia 11/04/2017    Priority: High  . Immunosuppressed status ([place]) 10/07/2017    Priority: High  . Adenomatous polyp of colon 09/28/2017    Priority: High  . Mixed hyperlipidemia 03/10/2013    Priority: High  . Primary osteoarthritis of knees, bilateral 03/01/2013    Priority: High  . Chronic back pain 10/13/2012    Priority: High  . Impaired glucose tolerance 12/13/2011    Priority: High  . History of liver transplant ([place]) 02/22/2009    Priority: High  . Migraine with aura and without status migrainosus, not intractable 07/03/2018    Priority: Medium  . Cervical stenosis of spine 01/25/2017    Priority: Medium  . Fibromyalgia 08/24/2013    Priority: Medium  . Hx of tuberculosis 10/13/2012    Priority: Medium  . Anxiety disorder 09/16/2007    Priority: Medium  . AS (sickle cell trait) ([place]) 06/29/2019    Priority: Low  . Cervical facet joint syndrome 03/28/2018  . Spondylosis of cervical region without myelopathy or radiculopathy 03/28/2018  . Lumbar facet arthropathy 12/27/2017   Current Meds  Medication Sig  . acetaminophen (TYLENOL) 500 MG tablet Take 1 tablet (500 mg total) by mouth  every 6 (six) hours as needed.  . cyclobenzaprine (FLEXERIL) 10 MG tablet Take 1 tablet (10 mg total) by mouth 3 (three) times daily as needed. (Patient taking differently: Take 10 mg by mouth as needed. )  . diclofenac Sodium (VOLTAREN) 1 % GEL Apply 4 g topically 4 (four) times daily as needed.  Marland Kitchen esomeprazole (NEXIUM) 20 MG capsule TAKE 1 CAPSULE(20 MG) BY MOUTH DAILY  . fluticasone (FLONASE) 50 MCG/ACT nasal spray Place 2 sprays into both nostrils daily.  . metFORMIN (GLUCOPHAGE) 500 MG tablet Take 1 tablet (500 mg total) by mouth 2 (two) times daily with a meal.  . mycophenolate (CELLCEPT) 500 MG tablet Take 500 mg by mouth 2 (two) times daily.  Marland Kitchen olopatadine (PATANOL) 0.1 % ophthalmic solution Place 1 drop into both eyes 2 (two) times daily.  . Omega-3 Fatty Acids (FISH OIL) 1000 MG CAPS Take 1 capsule by mouth daily.  . PredniSONE (DELTASONE PO) Take 5 mg by mouth daily.   . Rimegepant Sulfate (NURTEC) 75 MG TBDP Take 75 mg by mouth daily as needed. For migraines. Take as close to onset of migraine as possible. One daily maximum.  . tacrolimus (PROGRAF) 1 MG capsule Take 2 capsules (2 mg total) by mouth 2 (two) times daily.    Allergies: Patient is allergic to erythromycin, ezetimibe, other, rofecoxib, statins, zonisamide,  and nsaids. Family History: Patient family history includes Alzheimer's disease in her mother; Cancer in her sister; Dementia in her mother; Diabetes in her brother and mother; Gout in her mother; Heart disease in her sister; Hypertension in her mother and sister; Stroke in her mother. Social History:  Patient  reports that she has never smoked. She has never used smokeless tobacco. She reports that she does not drink alcohol and does not use drugs.  Review of Systems: Constitutional: Negative for fever malaise or anorexia Cardiovascular: negative for chest pain Respiratory: negative for SOB or persistent cough Gastrointestinal: negative for abdominal  pain  Objective  Vitals: BP 140/86   Pulse 87   Temp 98.1 F (36.7 C) (Temporal)   Ht 5\' 4"  (1.626 m)   Wt 202 lb (91.6 kg)   SpO2 99%   BMI 34.67 kg/m  General: no acute distress , A&Ox3  Skin:  Warm, no rashes, several benign appearing lesions on legs     Commons side effects, risks, benefits, and alternatives for medications and treatment plan prescribed today were discussed, and the patient expressed understanding of the given instructions. Patient is instructed to call or message via MyChart if he/she has any questions or concerns regarding our treatment plan. No barriers to understanding were identified. We discussed Red Flag symptoms and signs in detail. Patient expressed understanding regarding what to do in case of urgent or emergency type symptoms.   Medication list was reconciled, printed and provided to the patient in AVS. Patient instructions and summary information was reviewed with the patient as documented in the AVS. This note was prepared with assistance of Dragon voice recognition software. Occasional wrong-word or sound-a-like substitutions may have occurred due to the inherent limitations of voice recognition software  This visit occurred during the SARS-CoV-2 public health emergency.  Safety protocols were in place, including screening questions prior to the visit, additional usage of staff PPE, and extensive cleaning of exam room while observing appropriate contact time as indicated for disinfecting solutions.

## 2020-05-10 ENCOUNTER — Encounter: Payer: Self-pay | Admitting: Family Medicine

## 2020-05-10 ENCOUNTER — Telehealth: Payer: Self-pay

## 2020-05-10 ENCOUNTER — Ambulatory Visit (INDEPENDENT_AMBULATORY_CARE_PROVIDER_SITE_OTHER): Payer: Medicare Other | Admitting: Family Medicine

## 2020-05-10 ENCOUNTER — Other Ambulatory Visit: Payer: Self-pay

## 2020-05-10 DIAGNOSIS — S01511D Laceration without foreign body of lip, subsequent encounter: Secondary | ICD-10-CM | POA: Diagnosis not present

## 2020-05-10 DIAGNOSIS — S134XXD Sprain of ligaments of cervical spine, subsequent encounter: Secondary | ICD-10-CM

## 2020-05-10 DIAGNOSIS — T07XXXA Unspecified multiple injuries, initial encounter: Secondary | ICD-10-CM

## 2020-05-10 NOTE — Patient Instructions (Signed)
Please follow up as scheduled for your next visit with me: 08/24/2020   If you have any questions or concerns, please don't hesitate to send me a message via MyChart or call the office at [phone]. Thank you for visiting with [place] today! It's our pleasure caring for you.  Use ice on your nose for the next day or two. Use flonase as well.  Rest, heating pads to neck and medications will be helpful as well.   I'm glad you are going to be OK.
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
## 2020-05-10 NOTE — Telephone Encounter (Signed)
Please advise 

## 2020-05-11 ENCOUNTER — Other Ambulatory Visit: Payer: Self-pay | Admitting: Family Medicine

## 2020-05-11 ENCOUNTER — Telehealth: Payer: Self-pay
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
## 2020-05-11 NOTE — Telephone Encounter (Signed)
Pt called back in. Correction : She is wanting MRI. NOT a CT scan
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
## 2020-05-12 NOTE — Telephone Encounter (Signed)
There is no indication for MRI. Would not be able to order or get covered from insurance company   Rec f/u in office in 4 weeks for recheck if needed.

## 2020-05-12 NOTE — Telephone Encounter (Signed)
Ok to place the order for a MRI? Please advise.

## 2020-05-13 ENCOUNTER — Telehealth: Payer: Self-pay

## 2020-05-13 NOTE — Telephone Encounter (Signed)
Will evaluate her again next week.

## 2020-05-13 NOTE — Telephone Encounter (Signed)
Patient is requesting a call regarding her MRI she requested

## 2020-05-13 NOTE — Telephone Encounter (Signed)
Advised patient of PCP recommendations listed below. Patient states she talked to insurance company who recommended MRI to be performed since patient is experiencing episodes of randomly falling asleep. Is coming in office next week to have stitches removed from mouth.

## 2020-05-13 NOTE — Telephone Encounter (Signed)
Patient aware of PCP recommendations

## 2020-05-18 ENCOUNTER — Other Ambulatory Visit: Payer: Self-pay

## 2020-05-18 ENCOUNTER — Ambulatory Visit (INDEPENDENT_AMBULATORY_CARE_PROVIDER_SITE_OTHER): Payer: Medicare Other | Admitting: Family Medicine

## 2020-05-18 ENCOUNTER — Encounter: Payer: Self-pay | Admitting: Family Medicine

## 2020-05-18 DIAGNOSIS — T07XXXA Unspecified multiple injuries, initial encounter: Secondary | ICD-10-CM

## 2020-05-18 DIAGNOSIS — S060X0S Concussion without loss of consciousness, sequela: Secondary | ICD-10-CM
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
## 2020-05-19 ENCOUNTER — Encounter: Payer: Self-pay | Admitting: Family Medicine
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
## 2020-05-20 ENCOUNTER — Telehealth: Payer: Self-pay

## 2020-05-20 NOTE — Telephone Encounter (Signed)
Patient is requesting a referral to a neurologist. Patient states the one she was going to go to is not accepting patients until may  Thank you

## 2020-05-23 ENCOUNTER — Other Ambulatory Visit: Payer: Self-pay

## 2020-05-23 DIAGNOSIS — S060X0S Concussion without loss of consciousness, sequela: Secondary | ICD-10-CM

## 2020-05-23 NOTE — Telephone Encounter (Signed)
Patient is referring to [facility], can we send somewhere else? I have placed a new referral.

## 2020-05-31 LAB — HM MAMMOGRAPHY

## 2020-06-01 ENCOUNTER — Telehealth: Payer: Self-pay
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
## 2020-06-01 NOTE — Telephone Encounter (Signed)
Patient called regarding her neuro referral and now possibly a plastic surgeon referral can we please call patient to discuss question she has regarding her referral

## 2020-06-02 ENCOUNTER — Other Ambulatory Visit: Payer: Self-pay

## 2020-06-02 DIAGNOSIS — S134XXD Sprain of ligaments of cervical spine, subsequent encounter: Secondary | ICD-10-CM

## 2020-06-02 DIAGNOSIS — T07XXXA Unspecified multiple injuries, initial encounter: Secondary | ICD-10-CM

## 2020-06-02 DIAGNOSIS — S060X0S Concussion without loss of consciousness, sequela: Secondary | ICD-10-CM

## 2020-06-02 NOTE — Telephone Encounter (Signed)
I have placed a new referral.  

## 2020-06-06 ENCOUNTER — Other Ambulatory Visit: Payer: Self-pay

## 2020-06-06 ENCOUNTER — Telehealth: Payer: Self-pay | Admitting: Neurology

## 2020-06-06 ENCOUNTER — Encounter: Payer: Self-pay | Admitting: Neurology

## 2020-06-06 ENCOUNTER — Ambulatory Visit: Payer: Medicare Other | Admitting: Neurology

## 2020-06-06 VITALS — BP 184/110 | HR 94 | Ht 64.0 in | Wt 205.0 lb

## 2020-06-06 DIAGNOSIS — F0781 Postconcussional syndrome: Secondary | ICD-10-CM | POA: Diagnosis not present

## 2020-06-06 DIAGNOSIS — G44311 Acute post-traumatic headache, intractable: Secondary | ICD-10-CM | POA: Insufficient documentation

## 2020-06-06 DIAGNOSIS — R51 Headache with orthostatic component, not elsewhere classified: Secondary | ICD-10-CM | POA: Diagnosis not present

## 2020-06-06 DIAGNOSIS — H539 Unspecified visual disturbance: Secondary | ICD-10-CM | POA: Diagnosis not present

## 2020-06-06 DIAGNOSIS — R42 Dizziness and giddiness: Secondary | ICD-10-CM

## 2020-06-06 DIAGNOSIS — R519 Headache, unspecified: Secondary | ICD-10-CM | POA: Diagnosis not present

## 2020-06-06 DIAGNOSIS — S0990XA Unspecified injury of head, initial encounter: Secondary | ICD-10-CM

## 2020-06-06 MED ORDER — NURTEC 75 MG PO TBDP: 75.0000 mg | ORAL_TABLET | Freq: Every day | ORAL | 11 refills | Status: DC | PRN

## 2020-06-06 MED ORDER — AMITRIPTYLINE HCL 10 MG PO TABS
10.0000 mg | ORAL_TABLET | Freq: Every day | ORAL | 3 refills | Status: DC
Start: 2020-06-06 — End: 2020-08-02

## 2020-06-06 NOTE — Telephone Encounter (Signed)
[employer] order sent to [facility] no auth they will reach out to the patient to schedule.

## 2020-06-06 NOTE — Telephone Encounter (Signed)
Request Reference Number: [id number]. NURTEC TAB 75MG  ODT is approved through 06/03/2021. Your patient may now fill this prescription and it will be covered.   I spoke with [facility] pharmacy and notified staff of the approval. Also faxed approval notice to [facility]. Received a receipt of confirmation.

## 2020-06-06 NOTE — Patient Instructions (Signed)
MRI of the brain Amitriptyline at bedtime Nurtec as needed during the day  Amitriptyline tablets What is this medicine? AMITRIPTYLINE (a mee TRIP ti leen) is used to treat depression. This medicine may be used for other purposes; ask your health care provider or pharmacist if you have questions. COMMON BRAND NAME(S): Elavil, Vanatrip What should I tell my health care provider before I take this medicine? They need to know if you have any of these conditions:  an alcohol problem  asthma, difficulty breathing  bipolar disorder or schizophrenia  difficulty passing urine, prostate trouble  glaucoma  heart disease or previous heart attack  liver disease  over active thyroid  seizures  thoughts or plans of suicide, a previous suicide attempt, or family history of suicide attempt  an unusual or allergic reaction to amitriptyline, other medicines, foods, dyes, or preservatives  pregnant or trying to get pregnant  breast-feeding How should I use this medicine? Take this medicine by mouth with a drink of water. Follow the directions on the prescription label. You can take the tablets with or without food. Take your medicine at regular intervals. Do not take it more often than directed. Do not stop taking this medicine suddenly except upon the advice of your doctor. Stopping this medicine too quickly may cause serious side effects or your condition may worsen. A special MedGuide will be given to you by the pharmacist with each prescription and refill. Be sure to read this information carefully each time. Talk to your pediatrician regarding the use of this medicine in children. Special care may be needed. Overdosage: If you think you have taken too much of this medicine contact a poison control center or emergency room at once. NOTE: This medicine is only for you. Do not share this medicine with others. What if I miss a dose? If you miss a dose, take it as soon as you can. If it is  almost time for your next dose, take only that dose. Do not take double or extra doses. What may interact with this medicine? Do not take this medicine with any of the following medications:  arsenic trioxide  certain medicines used to regulate abnormal heartbeat or to treat other heart conditions  cisapride  droperidol  halofantrine  linezolid  MAOIs like Carbex, Eldepryl, Marplan, Nardil, and Parnate  methylene blue  other medicines for mental depression  phenothiazines like perphenazine, thioridazine and chlorpromazine  pimozide  probucol  procarbazine  sparfloxacin  St. John's Wort This medicine may also interact with the following medications:  atropine and related drugs like hyoscyamine, scopolamine, tolterodine and others  barbiturate medicines for inducing sleep or treating seizures, like phenobarbital  cimetidine  disulfiram  ethchlorvynol  thyroid hormones such as levothyroxine  ziprasidone This list may not describe all possible interactions. Give your health care provider a list of all the medicines, herbs, non-prescription drugs, or dietary supplements you use. Also tell them if you smoke, drink alcohol, or use illegal drugs. Some items may interact with your medicine. What should I watch for while using this medicine? Tell your doctor if your symptoms do not get better or if they get worse. Visit your doctor or health care professional for regular checks on your progress. Because it may take several weeks to see the full effects of this medicine, it is important to continue your treatment as prescribed by your doctor. Patients and their families should watch out for new or worsening thoughts of suicide or depression. Also watch out for  sudden changes in feelings such as feeling anxious, agitated, panicky, irritable, hostile, aggressive, impulsive, severely restless, overly excited and hyperactive, or not being able to sleep. If this happens, especially  at the beginning of treatment or after a change in dose, call your health care professional. Dennis Bast may get drowsy or dizzy. Do not drive, use machinery, or do anything that needs mental alertness until you know how this medicine affects you. Do not stand or sit up quickly, especially if you are an older patient. This reduces the risk of dizzy or fainting spells. Alcohol may interfere with the effect of this medicine. Avoid alcoholic drinks. Do not treat yourself for coughs, colds, or allergies without asking your doctor or health care professional for advice. Some ingredients can increase possible side effects. Your mouth may get dry. Chewing sugarless gum or sucking hard candy, and drinking plenty of water will help. Contact your doctor if the problem does not go away or is severe. This medicine may cause dry eyes and blurred vision. If you wear contact lenses you may feel some discomfort. Lubricating drops may help. See your eye doctor if the problem does not go away or is severe. This medicine can cause constipation. Try to have a bowel movement at least every 2 to 3 days. If you do not have a bowel movement for 3 days, call your doctor or health care professional. This medicine can make you more sensitive to the sun. Keep out of the sun. If you cannot avoid being in the sun, wear protective clothing and use sunscreen. Do not use sun lamps or tanning beds/booths. What side effects may I notice from receiving this medicine? Side effects that you should report to your doctor or health care professional as soon as possible:  allergic reactions like skin rash, itching or hives, swelling of the face, lips, or tongue  anxious  breathing problems  changes in vision  confusion  elevated mood, decreased need for sleep, racing thoughts, impulsive behavior  eye pain  fast, irregular heartbeat  feeling faint or lightheaded, falls  feeling agitated, angry, or irritable  fever with increased  sweating  hallucination, loss of contact with reality  seizures  stiff muscles  suicidal thoughts or other mood changes  tingling, pain, or numbness in the feet or hands  trouble passing urine or change in the amount of urine  trouble sleeping  unusually weak or tired  vomiting  yellowing of the eyes or skin Side effects that usually do not require medical attention (report to your doctor or health care professional if they continue or are bothersome):  change in sex drive or performance  change in appetite or weight  constipation  dizziness  dry mouth  nausea  tired  tremors  upset stomach This list may not describe all possible side effects. Call your doctor for medical advice about side effects. You may report side effects to FDA at 1-800-FDA-1088. Where should I keep my medicine? Keep out of the reach of children. Store at room temperature between 20 and 25 degrees C (68 and 77 degrees F). Throw away any unused medicine after the expiration date. NOTE: This sheet is a summary. It may not cover all possible information. If you have questions about this medicine, talk to your doctor, pharmacist, or health care provider.  2020 Elsevier/Gold Standard (2018-05-13 13:04:32) Rimegepant oral dissolving tablet What is this medicine? RIMEGEPANT (ri ME je pant) is used to treat migraine headaches with or without aura. An aura is  a strange feeling or visual disturbance that warns you of an attack. It is not used to prevent migraines. This medicine may be used for other purposes; ask your health care provider or pharmacist if you have questions. COMMON BRAND NAME(S): NURTEC ODT What should I tell my health care provider before I take this medicine? They need to know if you have any of these conditions:  kidney disease  liver disease  an unusual or allergic reaction to rimegepant, other medicines, foods, dyes, or preservatives  pregnant or trying to get  pregnant  breast-feeding How should I use this medicine? Take the medicine by mouth. Follow the directions on the prescription label. Leave the tablet in the sealed blister pack until you are ready to take it. With dry hands, open the blister and gently remove the tablet. If the tablet breaks or crumbles, throw it away and take a new tablet out of the blister pack. Place the tablet in the mouth and allow it to dissolve, and then swallow. Do not cut, crush, or chew this medicine. You do not need water to take this medicine. Talk to your pediatrician about the use of this medicine in children. Special care may be needed. Overdosage: If you think you have taken too much of this medicine contact a poison control center or emergency room at once. NOTE: This medicine is only for you. Do not share this medicine with others. What if I miss a dose? This does not apply. This medicine is not for regular use. What may interact with this medicine? This medicine may interact with the following medications:  certain medicines for fungal infections like fluconazole, itraconazole  rifampin This list may not describe all possible interactions. Give your health care provider a list of all the medicines, herbs, non-prescription drugs, or dietary supplements you use. Also tell them if you smoke, drink alcohol, or use illegal drugs. Some items may interact with your medicine. What should I watch for while using this medicine? Visit your health care professional for regular checks on your progress. Tell your health care professional if your symptoms do not start to get better or if they get worse. What side effects may I notice from receiving this medicine? Side effects that you should report to your doctor or health care professional as soon as possible:  allergic reactions like skin rash, itching or hives; swelling of the face, lips, or tongue Side effects that usually do not require medical attention (report  these to your doctor or health care professional if they continue or are bothersome):  nausea This list may not describe all possible side effects. Call your doctor for medical advice about side effects. You may report side effects to FDA at 1-800-FDA-1088. Where should I keep my medicine? Keep out of the reach of children. Store at room temperature between 15 and 30 degrees C (59 and 86 degrees F). Throw away any unused medicine after the expiration date. NOTE: This sheet is a summary. It may not cover all possible information. If you have questions about this medicine, talk to your doctor, pharmacist, or health care provider.  2020 Elsevier/Gold Standard (2018-08-04 00:21:31)
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
## 2020-06-06 NOTE — Telephone Encounter (Signed)
Pt. states pharmacy told her they need an explanation as to why Dr. wants her to take Rimegepant Sulfate (NURTEC) 75 MG TBDP. Please advise.

## 2020-06-06 NOTE — Telephone Encounter (Signed)
Per Dr Lucia Gaskins, triptans contraindicated right now due to BP not controlled. PA completed on Cover My Meds. Awaiting determination from [employer]. Key: [id number].

## 2020-06-08 NOTE — Telephone Encounter (Signed)
Patient is returning Katie's phone call.  °

## 2020-06-08 NOTE — Telephone Encounter (Signed)
LMOVM advising patient to return my call

## 2020-06-09 NOTE — Telephone Encounter (Signed)
LMOVM to return call.
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
## 2020-06-13 ENCOUNTER — Other Ambulatory Visit: Payer: Self-pay

## 2020-06-13 DIAGNOSIS — S01511D Laceration without foreign body of lip, subsequent encounter: Secondary | ICD-10-CM

## 2020-06-13 NOTE — Telephone Encounter (Signed)
Ok to refer to Clinical cytogeneticist

## 2020-06-13 NOTE — Telephone Encounter (Signed)
LMOVM advising patient that referral to plastics has been ordered

## 2020-06-14 ENCOUNTER — Other Ambulatory Visit: Payer: Self-pay | Admitting: Neurology

## 2020-06-14 ENCOUNTER — Ambulatory Visit
Admission: RE | Admit: 2020-06-14 | Discharge: 2020-06-14 | Disposition: A | Discharge status: Home / Self Care | Payer: Medicare Other | Source: Ambulatory Visit | Attending: Neurology | Admitting: Neurology

## 2020-06-14 ENCOUNTER — Encounter: Payer: Self-pay | Admitting: Family Medicine

## 2020-06-14 ENCOUNTER — Telehealth: Payer: Self-pay | Admitting: Neurology

## 2020-06-14 DIAGNOSIS — G44311 Acute post-traumatic headache, intractable: Secondary | ICD-10-CM

## 2020-06-14 DIAGNOSIS — H539 Unspecified visual disturbance: Secondary | ICD-10-CM

## 2020-06-14 DIAGNOSIS — S0990XA Unspecified injury of head, initial encounter: Secondary | ICD-10-CM

## 2020-06-14 DIAGNOSIS — R51 Headache with orthostatic component, not elsewhere classified: Secondary | ICD-10-CM

## 2020-06-14 DIAGNOSIS — R42 Dizziness and giddiness: Secondary | ICD-10-CM

## 2020-06-14 DIAGNOSIS — R519 Headache, unspecified: Secondary | ICD-10-CM

## 2020-06-14 MED ORDER — ALPRAZOLAM 0.25 MG PO TABS: ORAL_TABLET | ORAL | 0 refills | Status: DC

## 2020-06-14 NOTE — Telephone Encounter (Signed)
Pt is asking something be called in to help her relax for the MRI she is having to r/s
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
## 2020-06-14 NOTE — Telephone Encounter (Signed)
Xanax sent thanks

## 2020-06-17 ENCOUNTER — Telehealth: Payer: Self-pay

## 2020-06-17 NOTE — Telephone Encounter (Signed)
Cassandra [name], can you please work this referral today and give the patient a call back in regard. Thanks! TP

## 2020-06-17 NOTE — Telephone Encounter (Signed)
Called pt and gave her info for [facility].

## 2020-06-17 NOTE — Telephone Encounter (Signed)
error 

## 2020-06-23 ENCOUNTER — Telehealth: Payer: Self-pay | Admitting: Neurology

## 2020-06-23 MED ORDER — NURTEC 75 MG PO TBDP: 75.0000 mg | ORAL_TABLET | Freq: Every day | ORAL | 0 refills | Status: DC | PRN

## 2020-06-23 NOTE — Telephone Encounter (Signed)
Pt called, Can I get some samples of Nurtec? I can not afford the $100 to get my refill. I would need some samples of Nurtec until I get paid. Would like a call from the nurse.

## 2020-06-23 NOTE — Telephone Encounter (Signed)
Spoke with Dr Jaynee Eagles. We are able to provide 4 sample boxes each with 2 tablets for total of 8. I have placed the order and also spoken with the patient who will come now to pick them up. She was very Patent attorney.   Printed out medication list with medication instructions for pt review.

## 2020-06-23 NOTE — Telephone Encounter (Signed)
Patient came by office and was given the Nurtec samples with medication list. She verbalized appreciation.

## 2020-06-24 ENCOUNTER — Other Ambulatory Visit: Payer: Self-pay

## 2020-06-24 ENCOUNTER — Ambulatory Visit
Admission: RE | Admit: 2020-06-24 | Discharge: 2020-06-24 | Disposition: A | Discharge status: Home / Self Care | Payer: Medicare Other | Source: Ambulatory Visit | Attending: Neurology | Admitting: Neurology

## 2020-06-24 DIAGNOSIS — R519 Headache, unspecified: Secondary | ICD-10-CM | POA: Diagnosis not present

## 2020-06-24 DIAGNOSIS — G44311 Acute post-traumatic headache, intractable: Secondary | ICD-10-CM | POA: Diagnosis not present

## 2020-06-24 DIAGNOSIS — R42 Dizziness and giddiness: Secondary | ICD-10-CM | POA: Diagnosis not present

## 2020-06-24 MED ORDER — GADOBENATE DIMEGLUMINE 529 MG/ML IV SOLN
20.0000 mL | Freq: Once | INTRAVENOUS | Status: AC | PRN
  Administered 2020-06-24: 20 mL via INTRAVENOUS

## 2020-06-28 ENCOUNTER — Telehealth: Payer: Self-pay | Admitting: *Deleted

## 2020-06-28 NOTE — Telephone Encounter (Signed)
Pt called back with additional questions. She may speak with [facility] medical records regarding getting a copy of the [facility] notes. She was encouraged again to speak with ENT.

## 2020-06-28 NOTE — Telephone Encounter (Signed)
Spoke with patient and discussed MRI brain results. She verbalized understanding. She stated she still has swelling of her nose and doesn't know why. She mentioned she was seeing ENT soon and I advised she discuss with them at the appointment. She verbalized understanding and appreciation for the call.

## 2020-06-28 NOTE — Telephone Encounter (Signed)
-----   Message from Melvenia Beam, MD sent at 06/24/2020  7:50 PM EST ----- MRI of the brain in unremarkable, essentially normal for age, no changes since last one in 2018. thanks

## 2020-06-30 DIAGNOSIS — J310 Chronic rhinitis: Secondary | ICD-10-CM | POA: Diagnosis not present

## 2020-06-30 DIAGNOSIS — J343 Hypertrophy of nasal turbinates: Secondary | ICD-10-CM | POA: Diagnosis not present

## 2020-07-13 ENCOUNTER — Encounter: Payer: Self-pay | Admitting: Plastic Surgery

## 2020-07-13 ENCOUNTER — Ambulatory Visit: Payer: Medicare Other | Admitting: Plastic Surgery

## 2020-07-13 ENCOUNTER — Other Ambulatory Visit: Payer: Self-pay

## 2020-07-13 VITALS — BP 136/92 | HR 99 | Ht 64.0 in | Wt 204.6 lb

## 2020-07-13 DIAGNOSIS — L989 Disorder of the skin and subcutaneous tissue, unspecified: Secondary | ICD-10-CM | POA: Diagnosis not present
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
## 2020-07-17 ENCOUNTER — Encounter: Payer: Self-pay | Admitting: Gastroenterology

## 2020-07-19 ENCOUNTER — Telehealth: Payer: Self-pay | Admitting: Family Medicine

## 2020-07-19 DIAGNOSIS — Z944 Liver transplant status: Secondary | ICD-10-CM | POA: Diagnosis not present

## 2020-07-19 DIAGNOSIS — Z7952 Long term (current) use of systemic steroids: Secondary | ICD-10-CM | POA: Diagnosis not present

## 2020-07-19 DIAGNOSIS — Z5181 Encounter for therapeutic drug level monitoring: Secondary | ICD-10-CM | POA: Diagnosis not present

## 2020-07-19 DIAGNOSIS — Z23 Encounter for immunization: Secondary | ICD-10-CM | POA: Diagnosis not present

## 2020-07-19 DIAGNOSIS — Z79899 Other long term (current) drug therapy: Secondary | ICD-10-CM | POA: Diagnosis not present

## 2020-07-19 DIAGNOSIS — D84821 Immunodeficiency due to drugs: Secondary | ICD-10-CM | POA: Diagnosis not present

## 2020-07-19 DIAGNOSIS — Z4823 Encounter for aftercare following liver transplant: Secondary | ICD-10-CM | POA: Diagnosis not present

## 2020-07-19 DIAGNOSIS — D849 Immunodeficiency, unspecified: Secondary | ICD-10-CM | POA: Diagnosis not present

## 2020-07-19 NOTE — Telephone Encounter (Signed)
Left message for patient to call back and schedule Medicare Annual Wellness Visit (AWV) either virtually OR in office.   Last AWV 05/21/19; please schedule at anytime with [facility]-Nurse Health Advisor at [facility].  This should be a 45 minute visit.

## 2020-07-28 ENCOUNTER — Other Ambulatory Visit: Payer: Self-pay | Admitting: Family Medicine

## 2020-08-02 ENCOUNTER — Other Ambulatory Visit: Payer: Self-pay

## 2020-08-02 ENCOUNTER — Ambulatory Visit: Payer: Medicare Other | Admitting: Adult Health

## 2020-08-02 ENCOUNTER — Encounter: Payer: Self-pay | Admitting: Adult Health

## 2020-08-02 VITALS — BP 140/90 | HR 86 | Ht 64.0 in | Wt 213.0 lb

## 2020-08-02 DIAGNOSIS — G44311 Acute post-traumatic headache, intractable: Secondary | ICD-10-CM | POA: Diagnosis not present

## 2020-08-02 DIAGNOSIS — G43109 Migraine with aura, not intractable, without status migrainosus: Secondary | ICD-10-CM | POA: Diagnosis not present
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
## 2020-08-02 NOTE — Patient Instructions (Signed)
Your Plan:  Continue nurtec and flexeril as needed If your symptoms worsen or you develop new symptoms please let us know.   Thank you for coming to see [place] at [facility]. I hope we have been able to provide you high quality care today.  You may receive a patient satisfaction survey over the next few weeks. We would appreciate your feedback and comments so that we may continue to improve ourselves and the health of our patients.

## 2020-08-07 ENCOUNTER — Other Ambulatory Visit: Payer: Self-pay | Admitting: Neurology

## 2020-08-24 ENCOUNTER — Other Ambulatory Visit: Payer: Self-pay

## 2020-08-24 ENCOUNTER — Ambulatory Visit (INDEPENDENT_AMBULATORY_CARE_PROVIDER_SITE_OTHER): Payer: Medicare Other | Admitting: Family Medicine

## 2020-08-24 ENCOUNTER — Encounter: Payer: Self-pay | Admitting: Family Medicine

## 2020-08-24 ENCOUNTER — Ambulatory Visit: Payer: Medicare Other | Admitting: Family Medicine

## 2020-08-24 VITALS — BP 118/84 | HR 69 | Temp 98.0°F | Resp 16 | Wt 202.4 lb

## 2020-08-24 DIAGNOSIS — Z944 Liver transplant status: Secondary | ICD-10-CM | POA: Diagnosis not present

## 2020-08-24 DIAGNOSIS — D849 Immunodeficiency, unspecified: Secondary | ICD-10-CM | POA: Diagnosis not present

## 2020-08-24 DIAGNOSIS — R7303 Prediabetes: Secondary | ICD-10-CM

## 2020-08-24 DIAGNOSIS — G43109 Migraine with aura, not intractable, without status migrainosus: Secondary | ICD-10-CM | POA: Diagnosis not present

## 2020-08-24 DIAGNOSIS — M797 Fibromyalgia: Secondary | ICD-10-CM | POA: Diagnosis not present

## 2020-08-24 DIAGNOSIS — G44311 Acute post-traumatic headache, intractable: Secondary | ICD-10-CM

## 2020-08-24 DIAGNOSIS — M170 Bilateral primary osteoarthritis of knee: Secondary | ICD-10-CM | POA: Diagnosis not present

## 2020-08-24 LAB — POCT GLYCOSYLATED HEMOGLOBIN (HGB A1C): Hemoglobin A1C: 5.9 % — AB (ref 4.0–5.6)
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
## 2020-08-24 NOTE — Patient Instructions (Signed)
Please return in 6 months for your annual complete physical; please come fasting.  Your sugar test has improved. Eat healthy and it should stay that way! :)   If you have any questions or concerns, please don't hesitate to send me a message via MyChart or call the office at [phone]. Thank you for visiting with [place] today! It's our pleasure caring for you.

## 2020-09-01 DIAGNOSIS — J343 Hypertrophy of nasal turbinates: Secondary | ICD-10-CM | POA: Diagnosis not present

## 2020-09-01 DIAGNOSIS — J310 Chronic rhinitis: Secondary | ICD-10-CM | POA: Diagnosis not present

## 2020-09-23 DIAGNOSIS — Z944 Liver transplant status: Secondary | ICD-10-CM | POA: Diagnosis not present

## 2020-09-23 DIAGNOSIS — Z23 Encounter for immunization: Secondary | ICD-10-CM | POA: Diagnosis not present

## 2020-09-23 DIAGNOSIS — D849 Immunodeficiency, unspecified: Secondary | ICD-10-CM | POA: Diagnosis not present

## 2020-09-26 DIAGNOSIS — Z01818 Encounter for other preprocedural examination: Secondary | ICD-10-CM | POA: Diagnosis not present

## 2020-09-29 DIAGNOSIS — J343 Hypertrophy of nasal turbinates: Secondary | ICD-10-CM | POA: Diagnosis not present

## 2020-09-29 DIAGNOSIS — J3489 Other specified disorders of nose and nasal sinuses: Secondary | ICD-10-CM | POA: Diagnosis not present

## 2020-10-24 DIAGNOSIS — Z944 Liver transplant status: Secondary | ICD-10-CM | POA: Diagnosis not present

## 2020-10-24 DIAGNOSIS — D849 Immunodeficiency, unspecified: Secondary | ICD-10-CM | POA: Diagnosis not present

## 2020-10-25 ENCOUNTER — Telehealth: Payer: Self-pay

## 2020-10-25 NOTE — Telephone Encounter (Signed)
error 

## 2021-01-24 DIAGNOSIS — Z8379 Family history of other diseases of the digestive system: Secondary | ICD-10-CM | POA: Diagnosis not present

## 2021-01-24 DIAGNOSIS — K644 Residual hemorrhoidal skin tags: Secondary | ICD-10-CM | POA: Diagnosis not present

## 2021-01-24 DIAGNOSIS — Z1211 Encounter for screening for malignant neoplasm of colon: Secondary | ICD-10-CM | POA: Diagnosis not present

## 2021-01-24 DIAGNOSIS — E785 Hyperlipidemia, unspecified: Secondary | ICD-10-CM | POA: Diagnosis not present

## 2021-01-24 DIAGNOSIS — Z944 Liver transplant status: Secondary | ICD-10-CM | POA: Diagnosis not present

## 2021-01-24 DIAGNOSIS — M1711 Unilateral primary osteoarthritis, right knee: Secondary | ICD-10-CM | POA: Diagnosis not present

## 2021-01-24 DIAGNOSIS — Z79899 Other long term (current) drug therapy: Secondary | ICD-10-CM | POA: Diagnosis not present

## 2021-01-24 DIAGNOSIS — M797 Fibromyalgia: Secondary | ICD-10-CM | POA: Diagnosis not present

## 2021-01-24 DIAGNOSIS — Z86010 Personal history of colonic polyps: Secondary | ICD-10-CM | POA: Diagnosis not present

## 2021-01-24 DIAGNOSIS — K219 Gastro-esophageal reflux disease without esophagitis: Secondary | ICD-10-CM | POA: Diagnosis not present

## 2021-01-24 DIAGNOSIS — Z7952 Long term (current) use of systemic steroids: Secondary | ICD-10-CM | POA: Diagnosis not present

## 2021-01-24 DIAGNOSIS — K648 Other hemorrhoids: Secondary | ICD-10-CM | POA: Diagnosis not present

## 2021-01-24 LAB — HM COLONOSCOPY

## 2021-02-01 ENCOUNTER — Encounter: Payer: Self-pay | Admitting: Family Medicine

## 2021-02-10 ENCOUNTER — Ambulatory Visit
Admission: EM | Admit: 2021-02-10 | Discharge: 2021-02-10 | Disposition: A | Discharge status: Home / Self Care | Payer: Medicare Other | Attending: Family Medicine | Admitting: Family Medicine

## 2021-02-10 ENCOUNTER — Other Ambulatory Visit: Payer: Self-pay

## 2021-02-10 ENCOUNTER — Telehealth: Payer: Self-pay

## 2021-02-10 ENCOUNTER — Encounter: Payer: Self-pay | Admitting: Emergency Medicine

## 2021-02-10 DIAGNOSIS — J0140 Acute pansinusitis, unspecified: Secondary | ICD-10-CM

## 2021-02-10 MED ORDER — LEVOCETIRIZINE DIHYDROCHLORIDE 5 MG PO TABS: 5.0000 mg | ORAL_TABLET | Freq: Every evening | ORAL | 0 refills | Status: AC

## 2021-02-10 MED ORDER — PROMETHAZINE-DM 6.25-15 MG/5ML PO SYRP: 5.0000 mL | ORAL_SOLUTION | Freq: Four times a day (QID) | ORAL | 0 refills | Status: DC | PRN

## 2021-02-10 MED ORDER — AMOXICILLIN 875 MG PO TABS: 875.0000 mg | ORAL_TABLET | Freq: Two times a day (BID) | ORAL | 0 refills | Status: DC

## 2021-02-10 NOTE — Telephone Encounter (Signed)
[facility] [place] RECORD Access[facility]urse Patient Name: Cassandra [name] Gender: Female DOB: November 08, 1960 Age: 60 Y 23 M 26 D Return Phone Number: [phone] (Primary) Address: City/ State/ Zip: [place]  [address] Client [facility] [place] [facility] Physician Billey Chang- MD Contact Type Call Who Is Calling Patient / Member / Family / Caregiver Call Type Triage / Clinical Relationship To Patient Self Return Phone Number [phone] (Primary) Chief Complaint BREATHING - shortness of breath or sounds breathless Reason for Call Symptomatic / Request for [place] states she is dizzy and short of breath Translation No Nurse Assessment Nurse: Gareth Eagle, RN, Raquel Sarna Date/Time Eilene Ghazi Time): 02/10/2021 1:26:24 PM Confirm and document reason for call. If symptomatic, describe symptoms. ---Caller states she is dizzy. States she is not SOB but feels like it is her sinuses. Has sinus congestion; nasal mucus is white. Had low grade temp (100-101) the other day but hasn't checked recently but does still fill intermittently hot. Does the patient have any new or worsening symptoms? ---Yes Will a triage be completed? ---Yes Related visit to physician within the last 2 weeks? ---No Does the PT have any chronic conditions? (i.e. diabetes, asthma, this includes High risk factors for pregnancy, etc.) ---Yes List chronic conditions. ---hx liver transplant Is this a behavioral health or substance abuse call? ---No Guidelines Guideline Title Affirmed Question Affirmed Notes Nurse Date/Time Eilene Ghazi Time) Sinus Pain or Congestion [1] Redness or swelling on the cheek, forehead or around the eye AND [2] no fever Theodoro Grist 02/10/2021 1:28:40 PM PLEASE NOTE: All timestamps contained within this report are represented as  [place] Standard Time. CONFIDENTIALTY NOTICE: This fax transmission is intended only for the addressee. It contains information that is legally privileged, confidential or otherwise protected from use or disclosure. If you are not the intended recipient, you are strictly prohibited from reviewing, disclosing, copying using or disseminating any of this information or taking any action in reliance on or regarding this information. If you have received this fax in error, please notify us immediately by telephone so that we can arrange for its return to [place]. Phone: [phone], Toll-Free: [phone], Fax: [phone] Page: 2 of 2 Call Id: [id number] [place]. Time Eilene Ghazi Time) Disposition Final User 02/10/2021 1:24:19 PM Send to Urgent Queue Peggye Fothergill 02/10/2021 1:31:24 PM See HCP within 4 Hours (or PCP triage) Yes Gareth Eagle, RN, Judeth Horn Disagree/Comply Comply Caller Understands Yes PreDisposition Did not know what to do Care Advice Given Per Guideline SEE HCP (OR PCP TRIAGE) WITHIN 4 HOURS: * IF OFFICE WILL BE CLOSED AND NO PCP (PRIMARY CARE PROVIDER) SECOND-LEVEL TRIAGE: You need to be seen within the next 3 or 4 hours. A nearby Urgent Care Center [facility]) is often a good source of care. Another choice is to go to the ED. Go sooner if you become worse. CALL BACK IF: * You become worse CARE ADVICE given per Sinus Pain or Congestion (Adult) guideline. Comments User: Tilden Fossa, RN Date/Time Eilene Ghazi Time): 02/10/2021 1:36:49 PM Contacted office via [place] - no appts or virtual visits available today. Pt informed. States she will go to UC. Referrals REFERRED TO PCP OFFICE GO TO FACILITY UNDECIDE

## 2021-02-10 NOTE — ED Provider Notes (Signed)
[facility]    CSN: [id number] Arrival date & time: 02/10/21  1439      History   Chief Complaint Chief Complaint  Patient presents with   Nasal Congestion   Fatigue   Headache    HPI Cassandra [name] is a 60 y.o. female.   HPI Patient presents today with nasal congestion, fatigue and headache x two weeks. No known exposure to COVID.  She endorses intermittent fevers as high as 101.  Endorse feeling lighheaded and dizzy at times. Hydrating well with fluids. She is afebrile at present.  Reports taking Tylenol to manage fever at home.  Denies any shortness of breath or chest pain.Ladon Applebaum profound fatigue.  Patient has a medical history significant of hepatitis, chronic renal insufficiency, cirrhosis, and history of a liver transplant in 2010.   Past Medical History:  Diagnosis Date   Anxiety    AS (sickle cell trait) ([place]) 06/29/2019   Autoimmune hepatitis ([place])    Chronic renal insufficiency    Cirrhosis ([place])    COLONIC POLYPS, ADENOMATOUS, HX OF 05/05/2008   Qualifier: Diagnosis of  By: Harlon Ditty CMA (AAMA), [name]     Depression    External hemorrhoids    Fibromyalgia    GERD (gastroesophageal reflux disease)    Hyperlipidemia    Internal hemorrhoids    Iron deficiency anemia    Liver replaced by transplant ([place]) 02/22/2009   Qualifier: Diagnosis of  By: Chester Holstein NP, Nevin Bloodgood     Migraine    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    Ruptured disc, cervical    multiple levels   Statin intolerance 05/13/2018    Patient Active Problem List   Diagnosis Date Noted   Intractable acute post-traumatic headache 06/06/2020   AS (sickle cell trait) ([place]) 06/29/2019   Migraine with aura and without status migrainosus, not intractable 07/03/2018   Statin intolerance 05/13/2018   Cervical facet joint syndrome 03/28/2018   Spondylosis of cervical region without myelopathy or radiculopathy 03/28/2018   Lumbar facet arthropathy 12/27/2017   Primary insomnia  11/04/2017   Immunosuppressed status ([place]) 10/07/2017   Adenomatous polyp of colon 09/28/2017   Cervical stenosis of spine 01/25/2017   Fibromyalgia 08/24/2013   Mixed hyperlipidemia 03/10/2013   Primary osteoarthritis of knees, bilateral 03/01/2013   Chronic back pain 10/13/2012   Hx of tuberculosis 10/13/2012   Impaired glucose tolerance 12/13/2011   History of liver transplant ([place]) 02/22/2009   Anxiety disorder 09/16/2007    Past Surgical History:  Procedure Laterality Date   ABDOMINAL HYSTERECTOMY  2010   KNEE ARTHROSCOPY Right 2010   LIVER TRANSPLANT  1992   ROTATOR CUFF REPAIR Left 2010   x2    OB History   No obstetric history on file.      Home Medications    Prior to Admission medications   Medication Sig Start Date End Date Taking? Authorizing Provider  amoxicillin (AMOXIL) 875 MG tablet Take 1 tablet (875 mg total) by mouth 2 (two) times daily. 02/10/21  Yes Scot Jun, FNP  levocetirizine (XYZAL) 5 MG tablet Take 1 tablet (5 mg total) by mouth every evening. 02/10/21  Yes Scot Jun, FNP  promethazine-dextromethorphan (PROMETHAZINE-DM) 6.25-15 MG/5ML syrup Take 5 mLs by mouth 4 (four) times daily as needed for cough. 02/10/21  Yes Scot Jun, FNP  cyclobenzaprine (FLEXERIL) 10 MG tablet Take 1 tablet (10 mg total) by mouth 3 (three) times daily as needed. Patient taking differently: Take 10 mg by  mouth as needed. 09/03/19   Melvenia Beam, MD  diclofenac Sodium (VOLTAREN) 1 % GEL Apply 4 g topically 4 (four) times daily as needed. 02/22/20   Leamon Arnt, MD  fluticasone [facility]) 50 MCG/ACT nasal spray SHAKE LIQUID AND USE 2 SPRAYS IN [facility] NOSTRIL DAILY 05/11/20   Leamon Arnt, MD  mycophenolate (CELLCEPT) 500 MG tablet Take 500 mg by mouth 2 (two) times daily.    [provider]  olopatadine (PATANOL) 0.1 % ophthalmic solution INSTILL 1 DROP IN BOTH EYES TWICE DAILY 07/28/20   Leamon Arnt, MD  Omega-3 Fatty Acids (FISH OIL)  1000 MG CAPS Take 1 capsule by mouth daily. 09/02/15   [provider]  PredniSONE (DELTASONE PO) Take 5 mg by mouth daily.     [provider]  Rimegepant Sulfate (NURTEC) 75 MG TBDP Take 75 mg by mouth daily as needed. For migraines. Take as close to onset of migraine as possible. One daily maximum. 06/23/20   Melvenia Beam, MD  tacrolimus (PROGRAF) 1 MG capsule Take 2 capsules (2 mg total) by mouth 2 (two) times daily. 02/22/20   Leamon Arnt, MD    Family History Family History  Problem Relation Age of Onset   Diabetes Brother    Heart disease Sister    Hypertension Sister    Diabetes Mother    Dementia Mother    Alzheimer's disease Mother    Gout Mother    Hypertension Mother    Stroke Mother    Cancer Sister    Colon cancer Neg Hx     Social History Social History   Tobacco Use   Smoking status: Never   Smokeless tobacco: Never  Vaping Use   Vaping Use: Never used  Substance Use Topics   Alcohol use: Never    Alcohol/week: 0.0 standard drinks   Drug use: Never     Allergies   Erythromycin, Ezetimibe, Other, Rofecoxib, Statins, Zonisamide, Elavil [amitriptyline], and Nsaids   Review of Systems Review of Systems Pertinent negatives listed in HPI   Physical Exam Triage Vital Signs ED Triage Vitals [02/10/21 1500]  Enc Vitals Group     BP 128/88     Pulse Rate (!) 103     Resp 17     Temp 98.7 F (37.1 C)     Temp Source Oral     SpO2 97 %     Weight      Height      Head Circumference      Peak Flow      Pain Score 0     Pain Loc      Pain Edu?      Excl. in [place]?    No data found.  Updated Vital Signs BP 128/88 (BP Location: Left Arm)   Pulse (!) 103   Temp 98.7 F (37.1 C) (Oral)   Resp 17   SpO2 97%   Visual Acuity Right Eye Distance:   Left Eye Distance:   Bilateral Distance:    Right Eye Near:   Left Eye Near:    Bilateral Near:     Physical Exam  General Appearance:    Alert, cooperative, no distress   HENT:   Normocephalic, ears normal, nares mucosal edema with congestion, rhinorrhea, oropharynx  patent clear   Eyes:    PERRL, conjunctiva/corneas clear, EOM's intact       Lungs:     Clear to auscultation bilaterally, respirations unlabored  Heart:  Regular rate and rhythm  Neurologic:   Awake, alert, oriented x 3. No apparent focal neurological           defect.      UC Treatments / Results  Labs (all labs ordered are listed, but only abnormal results are displayed) Labs Reviewed - No data to display  EKG   Radiology No results found.  Procedures Procedures (including critical care time)  Medications Ordered in UC Medications - No data to display  Initial Impression / Assessment and Plan / UC Course  I have reviewed the triage vital signs and the nursing notes.  Pertinent labs & imaging results that were available during my care of the patient were reviewed by me and considered in my medical decision making (see chart for details).    Acute sinusitis Treatment per discharge medication orders. ER if fatigue or dizziness, doesn't resolve with prescribed treatment. Final Clinical Impressions(s) / UC Diagnoses   Final diagnoses:  Acute non-recurrent pansinusitis   Discharge Instructions   None    ED Prescriptions     Medication Sig Dispense Auth. Provider   amoxicillin (AMOXIL) 875 MG tablet Take 1 tablet (875 mg total) by mouth 2 (two) times daily. 20 tablet Scot Jun, FNP   levocetirizine (XYZAL) 5 MG tablet Take 1 tablet (5 mg total) by mouth every evening. 90 tablet Scot Jun, FNP   promethazine-dextromethorphan (PROMETHAZINE-DM) 6.25-15 MG/5ML syrup Take 5 mLs by mouth 4 (four) times daily as needed for cough. 140 mL Scot Jun, FNP      PDMP not reviewed this encounter.   Scot Jun, [place] 02/12/21 [phone]

## 2021-02-10 NOTE — Telephone Encounter (Signed)
FYI

## 2021-02-10 NOTE — ED Triage Notes (Signed)
Pt presents with post nasal drip, fatigue, and headache xs 2 weeks. States had colonoscopy on 8/23 and has had symptoms since.

## 2021-02-14 ENCOUNTER — Telehealth (INDEPENDENT_AMBULATORY_CARE_PROVIDER_SITE_OTHER): Payer: Medicare Other | Admitting: Adult Health

## 2021-02-14 DIAGNOSIS — G43709 Chronic migraine without aura, not intractable, without status migrainosus: Secondary | ICD-10-CM | POA: Diagnosis not present
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
## 2021-02-28 ENCOUNTER — Other Ambulatory Visit: Payer: Self-pay

## 2021-02-28 ENCOUNTER — Ambulatory Visit (INDEPENDENT_AMBULATORY_CARE_PROVIDER_SITE_OTHER): Payer: Medicare Other | Admitting: Family Medicine

## 2021-02-28 ENCOUNTER — Encounter: Payer: Self-pay | Admitting: Family Medicine

## 2021-02-28 VITALS — BP 122/88 | HR 76 | Temp 98.2°F | Ht 64.0 in | Wt 204.8 lb

## 2021-02-28 DIAGNOSIS — R011 Cardiac murmur, unspecified: Secondary | ICD-10-CM | POA: Diagnosis not present

## 2021-02-28 DIAGNOSIS — M797 Fibromyalgia: Secondary | ICD-10-CM

## 2021-02-28 DIAGNOSIS — Z0000 Encounter for general adult medical examination without abnormal findings: Secondary | ICD-10-CM

## 2021-02-28 DIAGNOSIS — G43109 Migraine with aura, not intractable, without status migrainosus: Secondary | ICD-10-CM | POA: Diagnosis not present

## 2021-02-28 DIAGNOSIS — E782 Mixed hyperlipidemia: Secondary | ICD-10-CM

## 2021-02-28 DIAGNOSIS — F5101 Primary insomnia: Secondary | ICD-10-CM

## 2021-02-28 DIAGNOSIS — R7302 Impaired glucose tolerance (oral): Secondary | ICD-10-CM | POA: Diagnosis not present

## 2021-02-28 DIAGNOSIS — Z789 Other specified health status: Secondary | ICD-10-CM

## 2021-02-28 DIAGNOSIS — D573 Sickle-cell trait: Secondary | ICD-10-CM

## 2021-02-28 DIAGNOSIS — G8929 Other chronic pain: Secondary | ICD-10-CM

## 2021-02-28 DIAGNOSIS — G729 Myopathy, unspecified: Secondary | ICD-10-CM | POA: Insufficient documentation

## 2021-02-28 DIAGNOSIS — D849 Immunodeficiency, unspecified: Secondary | ICD-10-CM

## 2021-02-28 DIAGNOSIS — M545 Low back pain, unspecified: Secondary | ICD-10-CM

## 2021-02-28 DIAGNOSIS — Z944 Liver transplant status: Secondary | ICD-10-CM | POA: Diagnosis not present

## 2021-02-28 LAB — COMPREHENSIVE METABOLIC PANEL
ALT: 21 U/L (ref 0–35)
AST: 23 U/L (ref 0–37)
Albumin: 4 g/dL (ref 3.5–5.2)
Alkaline Phosphatase: 83 U/L (ref 39–117)
BUN: 25 mg/dL — ABNORMAL HIGH (ref 6–23)
CO2: 21 mEq/L (ref 19–32)
Calcium: 9.5 mg/dL (ref 8.4–10.5)
Chloride: 107 mEq/L (ref 96–112)
Creatinine, Ser: 1.23 mg/dL — ABNORMAL HIGH (ref 0.40–1.20)
GFR: 47.67 mL/min — ABNORMAL LOW (ref >60.00)
Glucose, Bld: 105 mg/dL — ABNORMAL HIGH (ref 70–99)
Potassium: 3.6 mEq/L (ref 3.5–5.1)
Sodium: 138 mEq/L (ref 135–145)
Total Bilirubin: 0.3 mg/dL (ref 0.2–1.2)
Total Protein: 6.9 g/dL (ref 6.0–8.3)

## 2021-02-28 LAB — CBC WITH DIFFERENTIAL/PLATELET
Basophils Absolute: 0 10*3/uL (ref 0.0–0.1)
Basophils Relative: 0.7 % (ref 0.0–3.0)
Eosinophils Absolute: 0.2 10*3/uL (ref 0.0–0.7)
Eosinophils Relative: 3.2 % (ref 0.0–5.0)
HCT: 41.3 % (ref 36.0–46.0)
Hemoglobin: 13.4 g/dL (ref 12.0–15.0)
Lymphocytes Relative: 30.8 % (ref 12.0–46.0)
Lymphs Abs: 1.9 10*3/uL (ref 0.7–4.0)
MCHC: 32.6 g/dL (ref 30.0–36.0)
MCV: 86.6 fl (ref 78.0–100.0)
Monocytes Absolute: 0.4 10*3/uL (ref 0.1–1.0)
Monocytes Relative: 6.9 % (ref 3.0–12.0)
Neutro Abs: 3.5 10*3/uL (ref 1.4–7.7)
Neutrophils Relative %: 58.4 % (ref 43.0–77.0)
Platelets: 155 10*3/uL (ref 150.0–400.0)
RBC: 4.77 Mil/uL (ref 3.87–5.11)
RDW: 14.1 % (ref 11.5–15.5)
WBC: 6 10*3/uL (ref 4.0–10.5)

## 2021-02-28 LAB — HEMOGLOBIN A1C: Hgb A1c MFr Bld: 6.5 % (ref 4.6–6.5)

## 2021-02-28 LAB — TSH: TSH: 1.98 u[IU]/mL (ref 0.35–5.50)

## 2021-02-28 LAB — LIPID PANEL
Cholesterol: 275 mg/dL — ABNORMAL HIGH (ref 0–200)
HDL: 58.4 mg/dL (ref >39.00)
LDL Cholesterol: 187 mg/dL — ABNORMAL HIGH (ref 0–99)
NonHDL: 216.68
Total CHOL/HDL Ratio: 5
Triglycerides: 148 mg/dL (ref 0.0–149.0)
VLDL: 29.6 mg/dL (ref 0.0–40.0)

## 2021-02-28 NOTE — Progress Notes (Signed)
Subjective  Chief Complaint  Patient presents with   Annual Exam    Fasting   Hyperlipidemia    HPI: Cassandra [name] is a 60 y.o. female who presents to [facility] today for a Female Wellness Visit. She also has the concerns and/or needs as listed above in the chief complaint. These will be addressed in addition to the Health Maintenance Visit.   Wellness Visit: annual visit with health maintenance review and exam without Pap  HM: had colonoscopy last month at [place]; nl. Mammo due in December. Declines vaccines at this time. Overall, doing well Chronic disease f/u and/or acute problem visit: (deemed necessary to be done in addition to the wellness visit): Liver transplant on chronic pred/immunosuppresants managed by [facility] and stable.  Fibormyalgia and BS:JGGE flare over the last few days; may be weather change related. Mild.  Familial HLD: intolerant to statins and zetia. Tries to follow a low fat diet. Weight is stable. No known Arteriovascular disease.  Migraines: improving slowly since post-traumatic flare. Sees neuro  Assessment  1. Annual physical exam   2. History of liver transplant (Cassandra [name])   3. Fibromyalgia   4. Impaired glucose tolerance   5. Mixed hyperlipidemia   6. Immunosuppressed status (Cassandra [name])   7. Chronic midline low back pain without sciatica   8. Primary insomnia   9. Myopathy, unspecified   10. AS (sickle cell trait) (H[facility]) Chronic  11. Migraine with aura and without status migrainosus, not intractable   12. Statin intolerance      Plan  Female Wellness Visit: Age appropriate Health Maintenance and Prevention measures were discussed with patient. Included topics are cancer screening recommendations, ways to keep healthy (see AVS) including dietary and exercise recommendations, regular eye and dental care, use of seat belts, and avoidance of moderate alcohol use and tobacco use. Screens are up to date. Pt has eye exam scheduled for  January 2023 BMI: discussed patient's BMI and encouraged positive lifestyle modifications to help get to or maintain a target BMI. HM needs and immunizations were addressed and ordered. See below for orders. See HM and immunization section for updates. Declines flu and shingrix Routine labs and screening tests ordered including cmp, cbc and lipids where appropriate. Discussed recommendations regarding Vit D and calcium supplementation (see AVS)  Chronic disease management visit and/or acute problem visit: Transplant/immunosuppressed: remains stable, year 63! Per [facility] Fibro/back pain and OA knees: mild flare, conservative mgt. Rest, ice/heat and time.  HLD: recheck. If LDL remains very elevated,will refer to lipid clinic  IGT: recheck a1c,  Migraines are stabilizing.   Follow up: 6 mo for recheck.   Orders Placed This Encounter  Procedures   CBC with Differential/Platelet   Comprehensive metabolic panel   Hemoglobin A1c   TSH   Lipid panel    No orders of the defined types were placed in this encounter.     Body mass index is 35.15 kg/m. Wt Readings from Last 3 Encounters:  02/28/21 204 lb 12.8 oz (92.9 kg)  08/24/20 202 lb 6.4 oz (91.8 kg)  08/02/20 213 lb (96.6 kg)     Patient Active Problem List   Diagnosis Date Noted   Statin intolerance 05/13/2018    Priority: High   Primary insomnia 11/04/2017    Priority: High   Immunosuppressed status ([place]) 10/07/2017    Priority: High   Adenomatous polyp of colon 09/28/2017    Priority: High    Per [facility] liver clinic found in  2009; followed up in 2011.    Mixed hyperlipidemia 03/10/2013    Priority: High   Primary osteoarthritis of knees, bilateral 03/01/2013    Priority: High    Dr. Michaelle Birks saw in 08/2016 for pain and was considering Synvisc.  High risk for total knee due to immune suppressant post liver transplant. [facility] 2019: severe OA right knee with recurrent effusions. S/p synvisc w/o relief. Holding off on  replacement.     Chronic back pain 10/13/2012    Priority: High   Impaired glucose tolerance 12/13/2011    Priority: High   History of liver transplant (Cassandra [name]) 02/22/2009    Priority: High    [facility]:due to AIH with cirrhosis.  1992.     Migraine with aura and without status migrainosus, not intractable 07/03/2018    Priority: Medium   Cervical stenosis of spine 01/25/2017    Priority: Medium   Fibromyalgia 08/24/2013    Priority: Medium   Hx of tuberculosis 10/13/2012    Priority: Medium   Anxiety disorder 09/16/2007    Priority: Medium    Has failed prozac, elavil, zoloft in past.  Used nightly xanax since 2000; weaned 2019.     AS (sickle cell trait) (Cassandra [name]) 06/29/2019    Priority: Low   Myopathy, unspecified 02/28/2021    Intolerant to statins and zetia    Intractable acute post-traumatic headache 06/06/2020   Cervical facet joint syndrome 03/28/2018   Spondylosis of cervical region without myelopathy or radiculopathy 03/28/2018   Lumbar facet arthropathy 12/27/2017   Health Maintenance  Topic Date Due   COVID-19 Vaccine (4 - Booster for Pfizer series) 10/11/2020   Zoster Vaccines- Shingrix (1 of 2) 05/30/2021 (Originally [birth year]/18/1979)   MAMMOGRAM  05/31/2021   TETANUS/TDAP  05/04/2030   COLONOSCOPY (Pts 45-11yrs Insurance coverage will need to be confirmed)  01/25/2031   Hepatitis C Screening  Completed   HIV Screening  Completed   HPV VACCINES  Aged Out   INFLUENZA VACCINE  Discontinued   Immunization History  Administered Date(s) Administered   Hepatitis A, Adult 09/27/2016, 09/26/2017   Influenza Split 03/05/2012   Influenza, Quadrivalent, Recombinant, Inj, Pf 04/29/2013   Influenza, Seasonal, Injecte, Preservative Fre 03/02/2014, 02/28/2015   Influenza,inj,Quad PF,6+ Mos 03/28/2016   Influenza-Unspecified 05/26/2013, 03/28/2016   PFIZER Comirnaty(Gray Top)Covid-19 Tri-Sucrose Vaccine 07/19/2020   PFIZER(Purple Top)SARS-COV-2 Vaccination 08/27/2019, 09/17/2019    Pneumococcal Conjugate-13 09/22/2015   Pneumococcal Polysaccharide-23 09/27/2016   Tdap 05/17/2008, 12/03/2010   Tetanus 05/04/2020   We updated and reviewed the patient's past history in detail and it is documented below. Allergies: Patient is allergic to erythromycin, ezetimibe, other, rofecoxib, statins, zonisamide, elavil [amitriptyline], and nsaids. Past Medical History Patient  has a past medical history of Anxiety, AS (sickle cell trait) ([place]) (06/29/2019), Autoimmune hepatitis ([place]), Chronic renal insufficiency, Cirrhosis ([place]), COLONIC POLYPS, ADENOMATOUS, HX OF (05/05/2008), Depression, External hemorrhoids, Fibromyalgia, GERD (gastroesophageal reflux disease), Hyperlipidemia, Internal hemorrhoids, Iron deficiency anemia, Liver replaced by transplant ([place]) (02/22/2009), Migraine, 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, Ruptured disc, cervical, and Statin intolerance (05/13/2018). Past Surgical History Patient  has a past surgical history that includes Liver transplant (1992); Knee arthroscopy (Right, 2010); Rotator cuff repair (Left, 2010); and Abdominal hysterectomy (2010). Family History: Patient family history includes Alzheimer's disease in her mother; Cancer in her sister; Dementia in her mother; Diabetes in her brother and mother; Gout in her mother; Heart disease in her sister; Hypertension in her mother and sister; Stroke in her mother. Social History:  Patient  reports that  she has never smoked. She has never used smokeless tobacco. She reports that she does not drink alcohol and does not use drugs.  Review of Systems: Constitutional: negative for fever or malaise Ophthalmic: negative for photophobia, double vision or loss of vision Cardiovascular: negative for chest pain, dyspnea on exertion, or new LE swelling Respiratory: negative for SOB or persistent cough Gastrointestinal: negative for abdominal pain, change in bowel habits or melena Genitourinary: negative for dysuria or  gross hematuria, no abnormal uterine bleeding or disharge Musculoskeletal: negative for new gait disturbance or muscular weakness Integumentary: negative for new or persistent rashes, no breast lumps Neurological: negative for TIA or stroke symptoms Psychiatric: negative for SI or delusions Allergic/Immunologic: negative for hives  Patient Care Team    Relationship Specialty Notifications Start End  Leamon Arnt, MD PCP - General Family Medicine  10/07/17   Mauri Pole, MD Consulting Physician Gastroenterology  11/04/17   Lavonia Drafts  Pain Medicine  11/04/17   Melvenia Beam, MD Consulting Physician Neurology  05/22/19   Teena Irani, MD Consulting Physician Internal Medicine  05/22/19   Lambert Keto, PA-C  Physician Assistant  05/22/19     Objective  Vitals: BP 122/88   Pulse 76   Temp 98.2 F (36.8 C) (Temporal)   Ht 5\' 4"  (1.626 m)   Wt 204 lb 12.8 oz (92.9 kg)   BMI 35.15 kg/m  General:  Well developed, well nourished, no acute distress  Psych:  Alert and orientedx3,normal mood and affect HEENT:  Normocephalic, atraumatic, non-icteric sclera,  supple neck without adenopathy, mass or thyromegaly Cardiovascular:  Normal S1, S2, RRR without gallop, rub + murmur Respiratory:  Good breath sounds bilaterally, CTAB with normal respiratory effort Gastrointestinal: normal bowel sounds, soft, non-tender, no noted masses. No HSM MSK: no deformities, contusions. Left knee with small effusion.  Skin:  Warm, no rashes or suspicious lesions noted Neurologic:    Mental status is normal. Gross motor and sensory exams are normal. Normal gait. No tremor   Commons side effects, risks, benefits, and alternatives for medications and treatment plan prescribed today were discussed, and the patient expressed understanding of the given instructions. Patient is instructed to call or message via MyChart if he/she has any questions or concerns regarding our treatment plan. No barriers to  understanding were identified. We discussed Red Flag symptoms and signs in detail. Patient expressed understanding regarding what to do in case of urgent or emergency type symptoms.  Medication list was reconciled, printed and provided to the patient in AVS. Patient instructions and summary information was reviewed with the patient as documented in the AVS. This note was prepared with assistance of Dragon voice recognition software. Occasional wrong-word or sound-a-like substitutions may have occurred due to the inherent limitations of voice recognition software  This visit occurred during the SARS-CoV-2 public health emergency.  Safety protocols were in place, including screening questions prior to the visit, additional usage of staff PPE, and extensive cleaning of exam room while observing appropriate contact time as indicated for disinfecting solutions.

## 2021-02-28 NOTE — Patient Instructions (Signed)
Please return in 6 months to recheck sugars.   I will let you know if we need to get you to the lipid clinic to help with your cholesterol management.   If you have any questions or concerns, please don't hesitate to send me a message via MyChart or call the office at [phone]. Thank you for visiting with [place] today! It's our pleasure caring for you.

## 2021-03-01 ENCOUNTER — Other Ambulatory Visit: Payer: Self-pay

## 2021-03-01 ENCOUNTER — Encounter: Payer: Self-pay | Admitting: Family Medicine

## 2021-03-01 DIAGNOSIS — Z789 Other specified health status: Secondary | ICD-10-CM

## 2021-03-01 DIAGNOSIS — N1831 Chronic kidney disease, stage 3a: Secondary | ICD-10-CM | POA: Insufficient documentation

## 2021-03-01 DIAGNOSIS — E7849 Other hyperlipidemia: Secondary | ICD-10-CM

## 2021-03-08 ENCOUNTER — Telehealth (INDEPENDENT_AMBULATORY_CARE_PROVIDER_SITE_OTHER): Payer: Medicare Other | Admitting: Family Medicine

## 2021-03-08 ENCOUNTER — Encounter: Payer: Self-pay | Admitting: Family Medicine

## 2021-03-08 ENCOUNTER — Other Ambulatory Visit: Payer: Self-pay

## 2021-03-08 DIAGNOSIS — R6889 Other general symptoms and signs: Secondary | ICD-10-CM

## 2021-03-08 MED ORDER — PROMETHAZINE-DM 6.25-15 MG/5ML PO SYRP: 5.0000 mL | ORAL_SOLUTION | Freq: Four times a day (QID) | ORAL | 0 refills | Status: DC | PRN

## 2021-03-08 NOTE — Progress Notes (Signed)
Virtual Visit via Video Note  Subjective  CC:  Chief Complaint  Patient presents with   Cough    Coughing up mucus   Chills    Fever of 100, has not had a covid test     I connected with [name] on 03/08/21 at  4:15 PM EDT by a video enabled telemedicine application and verified that I am speaking with the correct person using two identifiers. Location patient: Home Location provider: [facility] [place], Office Persons participating in the virtual visit: Kura LARISSA PEGG, Leamon Arnt, MD [place]  I discussed the limitations of evaluation and management by telemedicine and the availability of in person appointments. The patient expressed understanding and agreed to proceed. HPI: Cassandra [name] is a 60 y.o. female who was contacted today to address the problems listed above in the chief complaint. 60 yo immunosuppressed pt due to /o liver tx presents with 3 day h/o n/v, diarrhea today, fever to 102, tmax 101 in last 24 hours responsive to tylenol, malaise, decreased appetite, cough of clear sputum and nasal congestion w/o st, abdominal pain or sob. Mild myalgias. No chest pain. Able to keep down fluids now. No appetite. Hasn't checked for covid. Has not had flu shot this season.   Assessment  1. Flu-like symptoms      Plan  Flu like illness:  diff dx included covid, flu or other viral syndrome. No sxs of serious bacterial illness but discussed indetail. Rec covid testing and supportive. To return for abd pain or persistent fevers or sob. IF covid positive, recommend antiviral. Pt will test today and tomorrow. Phenergan/dm for cough and nausea. Hydrate. I discussed the assessment and treatment plan with the patient. The patient was provided an opportunity to ask questions and all were answered. The patient agreed with the plan and demonstrated an understanding of the instructions.   The patient was advised to call back or seek an  in-person evaluation if the symptoms worsen or if the condition fails to improve as anticipated. Follow up: No follow-ups on file.  08/28/2021  Meds ordered this encounter  Medications   promethazine-dextromethorphan (PROMETHAZINE-DM) 6.25-15 MG/5ML syrup    Sig: Take 5 mLs by mouth 4 (four) times daily as needed for cough.    Dispense:  240 mL    Refill:  0      I reviewed the patients updated PMH, FH, and SocHx.    Patient Active Problem List   Diagnosis Date Noted   Statin intolerance 05/13/2018    Priority: 1.   Primary insomnia 11/04/2017    Priority: 1.   Immunosuppressed status ([place]) 10/07/2017    Priority: 1.   Adenomatous polyp of colon 09/28/2017    Priority: 1.   Mixed hyperlipidemia 03/10/2013    Priority: 1.   Primary osteoarthritis of knees, bilateral 03/01/2013    Priority: 1.   Chronic back pain 10/13/2012    Priority: 1.   Impaired glucose tolerance 12/13/2011    Priority: 1.   History of liver transplant ([place]) 02/22/2009    Priority: 1.   Migraine with aura and without status migrainosus, not intractable 07/03/2018    Priority: 2.   Cervical stenosis of spine 01/25/2017    Priority: 2.   Fibromyalgia 08/24/2013    Priority: 2.   Hx of tuberculosis 10/13/2012    Priority: 2.   Anxiety disorder 09/16/2007    Priority: 2.   AS (sickle  cell trait) ([place]) 06/29/2019    Priority: 3.   Chronic kidney disease (CKD) stage G3a/A1, moderately decreased glomerular filtration rate (GFR) between 45-59 mL/min/1.73 square meter and albuminuria creatinine ratio less than 30 mg/g (HCC) 03/01/2021   Myopathy, unspecified 02/28/2021   Heart murmur 02/28/2021   Intractable acute post-traumatic headache 06/06/2020   Cervical facet joint syndrome 03/28/2018   Spondylosis of cervical region without myelopathy or radiculopathy 03/28/2018   Lumbar facet arthropathy 12/27/2017   Current Meds  Medication Sig   cyclobenzaprine (FLEXERIL) 10 MG tablet Take 1 tablet (10 mg  total) by mouth 3 (three) times daily as needed. (Patient taking differently: Take 10 mg by mouth as needed.)   diclofenac Sodium (VOLTAREN) 1 % GEL Apply 4 g topically 4 (four) times daily as needed.   fluticasone (FLONASE) 50 MCG/ACT nasal spray SHAKE LIQUID AND USE 2 SPRAYS IN EACH NOSTRIL DAILY   levocetirizine (XYZAL) 5 MG tablet Take 1 tablet (5 mg total) by mouth every evening.   mycophenolate (CELLCEPT) 500 MG tablet Take 500 mg by mouth 2 (two) times daily.   olopatadine (PATANOL) 0.1 % ophthalmic solution INSTILL 1 DROP IN BOTH EYES TWICE DAILY   Omega-3 Fatty Acids (FISH OIL) 1000 MG CAPS Take 1 capsule by mouth daily.   PredniSONE (DELTASONE PO) Take 5 mg by mouth daily.    promethazine-dextromethorphan (PROMETHAZINE-DM) 6.25-15 MG/5ML syrup Take 5 mLs by mouth 4 (four) times daily as needed for cough.   Rimegepant Sulfate (NURTEC) 75 MG TBDP Take 75 mg by mouth daily as needed. For migraines. Take as close to onset of migraine as possible. One daily maximum.   tacrolimus (PROGRAF) 1 MG capsule Take 2 capsules (2 mg total) by mouth 2 (two) times daily.    Allergies: Patient is allergic to erythromycin, ezetimibe, other, rofecoxib, statins, zonisamide, elavil [amitriptyline], and nsaids. Family History: Patient family history includes Alzheimer's disease in her mother; Cancer in her sister; Dementia in her mother; Diabetes in her brother and mother; Gout in her mother; Heart disease in her sister; Hypertension in her mother and sister; Stroke in her mother. Social History:  Patient  reports that she has never smoked. She has never used smokeless tobacco. She reports that she does not drink alcohol and does not use drugs.  Review of Systems: Constitutional: Negative for fever malaise or anorexia Cardiovascular: negative for chest pain Respiratory: negative for SOB or persistent cough Gastrointestinal: negative for abdominal pain  OBJECTIVE Vitals: There were no vitals taken for  this visit. General: no acute distress , A&Ox3, nontoxic appearing. No visible respiratory distress.  Leamon Arnt, MD

## 2021-03-09 ENCOUNTER — Encounter: Payer: Self-pay | Admitting: Family Medicine

## 2021-03-13 DIAGNOSIS — Z944 Liver transplant status: Secondary | ICD-10-CM | POA: Diagnosis not present

## 2021-03-13 DIAGNOSIS — D849 Immunodeficiency, unspecified: Secondary | ICD-10-CM | POA: Diagnosis not present

## 2021-03-17 ENCOUNTER — Encounter: Payer: Self-pay | Admitting: Family Medicine

## 2021-03-17 ENCOUNTER — Telehealth (INDEPENDENT_AMBULATORY_CARE_PROVIDER_SITE_OTHER): Payer: Medicare Other | Admitting: Family Medicine

## 2021-03-17 ENCOUNTER — Telehealth: Payer: Self-pay

## 2021-03-17 ENCOUNTER — Other Ambulatory Visit: Payer: Self-pay

## 2021-03-17 DIAGNOSIS — J0101 Acute recurrent maxillary sinusitis: Secondary | ICD-10-CM | POA: Diagnosis not present

## 2021-03-17 MED ORDER — PROMETHAZINE-DM 6.25-15 MG/5ML PO SYRP: 5.0000 mL | ORAL_SOLUTION | Freq: Four times a day (QID) | ORAL | 0 refills | Status: DC | PRN

## 2021-03-17 MED ORDER — AZITHROMYCIN 250 MG PO TABS: ORAL_TABLET | ORAL | 0 refills | Status: DC

## 2021-03-17 NOTE — Telephone Encounter (Signed)
Patient called in stating she was prescribed zithromax.    Would like to know if this is in the family of erythromycin?     Patient wanted to make sure b/c she is allergic to erythromycin.

## 2021-03-17 NOTE — Telephone Encounter (Signed)
Spoke with patient, gave a verbal understanding °

## 2021-03-17 NOTE — Telephone Encounter (Signed)
Please advise 
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
## 2021-05-04 DIAGNOSIS — J310 Chronic rhinitis: Secondary | ICD-10-CM | POA: Diagnosis not present

## 2021-05-04 DIAGNOSIS — J343 Hypertrophy of nasal turbinates: Secondary | ICD-10-CM | POA: Diagnosis not present

## 2021-05-06 ENCOUNTER — Other Ambulatory Visit: Payer: Self-pay | Admitting: Family Medicine

## 2021-05-08 ENCOUNTER — Ambulatory Visit (INDEPENDENT_AMBULATORY_CARE_PROVIDER_SITE_OTHER): Payer: Medicare Other | Admitting: Family Medicine

## 2021-05-08 ENCOUNTER — Other Ambulatory Visit: Payer: Self-pay

## 2021-05-08 ENCOUNTER — Encounter: Payer: Self-pay | Admitting: Family Medicine

## 2021-05-08 VITALS — BP 132/92 | HR 86 | Temp 98.0°F | Ht 64.0 in | Wt 201.6 lb

## 2021-05-08 DIAGNOSIS — M1711 Unilateral primary osteoarthritis, right knee: Secondary | ICD-10-CM

## 2021-05-08 DIAGNOSIS — M25461 Effusion, right knee: Secondary | ICD-10-CM

## 2021-05-08 MED ORDER — TRIAMCINOLONE ACETONIDE 40 MG/ML IJ SUSP
40.0000 mg | Freq: Once | INTRAMUSCULAR | Status: AC
  Administered 2021-05-08: 40 mg via INTRAMUSCULAR

## 2021-05-08 NOTE — Patient Instructions (Signed)
Please follow up as scheduled for your next visit with me: 08/28/2021   If you have any questions or concerns, please don't hesitate to send me a message via MyChart or call the office at [phone]. Thank you for visiting with [place] today! It's our pleasure caring for you.   You had a steroid injection today.   Things to be aware of after this injection are listed below: You may experience no significant improvement or even a slight worsening in your symptoms during the first 24 to 48 hours.  After that we expect your symptoms to improve gradually over the next 2 weeks for the medicine to have its maximal effect.  You should continue to have improvement out to 6 weeks after your injection. I recommend icing the site of the injection for 20 minutes  1-2 times the day of your injection You may shower but no swimming, tub bath or Jacuzzi for 24 hours. If your bandage falls off this does not need to be replaced.  It is appropriate to remove the bandage after 4 hours. You may resume light activities as tolerated.     POSSIBLE PROCEDURE SIDE EFFECTS: The side effects of the injection are usually fairly minimal however if you may experience some of the following side effects that are usually self-limited and will is off on their own.  If you are concerned please feel free to call the office with questions:             Increased numbness or tingling             Nausea or vomiting             Swelling or bruising at the injection site    Please call our office if if you experience any of the following symptoms over the next week as these can be signs of infection:              Fever greater than 100.46F             Significant swelling at the injection site             Significant redness or drainage from the injection site

## 2021-05-08 NOTE — Progress Notes (Signed)
Subjective  CC:  Chief Complaint  Patient presents with   Knee Pain    Right knee pain since last Friday- she describes a burning-stabbing pain she can not take anymore   Hypothyroidism    HPI: Cassandra [name] is a 60 y.o. female who presents to the office today to address the problems listed above in the chief complaint. 60 year old female history of transplant and immunosuppression with known knee osteoarthritis presents with 3 to 4-day history of swelling and right knee pain without injury or overuse although she has been doing a lot of running around lately..  Pain is causing limping.  Difficulty sleeping.  No fevers or chills.  In the past has had effusions that required aspiration and steroid injections.  Last injection has been more than1-2 years ago.  Assessment  1. Primary osteoarthritis of right knee   2. Knee effusion, right      Plan  Osteoarthritis right knee with effusion: Symptomatic care with knee aspiration and steroid injection.  See after visit summary for post care instructions.  No complications.  Patient with less pain prior to discharge.  No red flag symptoms present.  Follow up: Return for as scheduled.  08/28/2021  No orders of the defined types were placed in this encounter.  No orders of the defined types were placed in this encounter.     I reviewed the patients updated PMH, FH, and SocHx.    Patient Active Problem List   Diagnosis Date Noted   Statin intolerance 05/13/2018    Priority: High   Primary insomnia 11/04/2017    Priority: High   Immunosuppressed status ([place]) 10/07/2017    Priority: High   Adenomatous polyp of colon 09/28/2017    Priority: High   Mixed hyperlipidemia 03/10/2013    Priority: High   Primary osteoarthritis of knees, bilateral 03/01/2013    Priority: High   Chronic back pain 10/13/2012    Priority: High   Impaired glucose tolerance 12/13/2011    Priority: High   History of liver transplant ([place]) 02/22/2009     Priority: High   Migraine with aura and without status migrainosus, not intractable 07/03/2018    Priority: Medium    Cervical stenosis of spine 01/25/2017    Priority: Medium    Fibromyalgia 08/24/2013    Priority: Medium    Hx of tuberculosis 10/13/2012    Priority: Medium    Anxiety disorder 09/16/2007    Priority: Medium    AS (sickle cell trait) ([place]) 06/29/2019    Priority: Low   Chronic kidney disease (CKD) stage G3a/A1, moderately decreased glomerular filtration rate (GFR) between 45-59 mL/min/1.73 square meter and albuminuria creatinine ratio less than 30 mg/g (HCC) 03/01/2021   Myopathy, unspecified 02/28/2021   Heart murmur 02/28/2021   Intractable acute post-traumatic headache 06/06/2020   Cervical facet joint syndrome 03/28/2018   Spondylosis of cervical region without myelopathy or radiculopathy 03/28/2018   Lumbar facet arthropathy 12/27/2017   Current Meds  Medication Sig   diclofenac Sodium (VOLTAREN) 1 % GEL Apply 4 g topically 4 (four) times daily as needed.   fluticasone (FLONASE) 50 MCG/ACT nasal spray SHAKE LIQUID AND USE 2 SPRAYS IN EACH NOSTRIL DAILY   mycophenolate (CELLCEPT) 500 MG tablet Take 500 mg by mouth 2 (two) times daily.   olopatadine (PATANOL) 0.1 % ophthalmic solution INSTILL 1 DROP IN BOTH EYES TWICE DAILY   Omega-3 Fatty Acids (FISH OIL) 1000 MG CAPS Take 1 capsule by mouth daily.  PredniSONE (DELTASONE PO) Take 5 mg by mouth daily.    tacrolimus (PROGRAF) 1 MG capsule Take 2 capsules (2 mg total) by mouth 2 (two) times daily.   [DISCONTINUED] azithromycin (ZITHROMAX) 250 MG tablet Take 2 tabs today, then 1 tab daily for 4 days    Allergies: Patient is allergic to erythromycin, ezetimibe, other, rofecoxib, statins, zonisamide, elavil [amitriptyline], and nsaids. Family History: Patient family history includes Alzheimer's disease in her mother; Cancer in her sister; Dementia in her mother; Diabetes in her brother and mother; Gout in her  mother; Heart disease in her sister; Hypertension in her mother and sister; Stroke in her mother. Social History:  Patient  reports that she has never smoked. She has never used smokeless tobacco. She reports that she does not drink alcohol and does not use drugs.  Review of Systems: Constitutional: Negative for fever malaise or anorexia Cardiovascular: negative for chest pain Respiratory: negative for SOB or persistent cough Gastrointestinal: negative for abdominal pain  Objective  Vitals: BP (!) 132/92   Pulse 86   Temp 98 F (36.7 C)   Ht 5\' 4"  (1.626 m)   Wt 201 lb 9.6 oz (91.4 kg)   BMI 34.60 kg/m  General: no acute distress , A&Ox3 Limps to get to exam table Right knee: Minimal warmth, no erythema, small effusion, crepitus, full range of motion.  No joint line tenderness.  Knee Arthrocentesis with Injection Procedure Note  Pre-operative Diagnosis: right knee osteoarthritis with effusion  Post-operative Diagnosis: same  Indications: Symptomatic relief of large effusion and Symptom relief from osteoarthritis  Anesthesia: Lidocaine 1% without epinephrine without added sodium bicarbonate  Procedure Details   Verbal consent was obtained for the procedure. Universal time out taken.  The Knee joint was prepped with alcohol and an 18 gauge needle was inserted into the joint from the lateral approach.  20 ml of clear yellow fluid was removed from the joint and discarded. Four ml 1% lidocaine and one ml of triamcinolone (KENALOG) 40mg /ml was then injected into the joint through the same needle. The needle was removed and the area cleansed and dressed.  Complications:  None; patient tolerated the procedure well.   Commons side effects, risks, benefits, and alternatives for medications and treatment plan prescribed today were discussed, and the patient expressed understanding of the given instructions. Patient is instructed to call or message via MyChart if he/she has any questions  or concerns regarding our treatment plan. No barriers to understanding were identified. We discussed Red Flag symptoms and signs in detail. Patient expressed understanding regarding what to do in case of urgent or emergency type symptoms.  Medication list was reconciled, printed and provided to the patient in AVS. Patient instructions and summary information was reviewed with the patient as documented in the AVS. This note was prepared with assistance of Dragon voice recognition software. Occasional wrong-word or sound-a-like substitutions may have occurred due to the inherent limitations of voice recognition software  This visit occurred during the SARS-CoV-2 public health emergency.  Safety protocols were in place, including screening questions prior to the visit, additional usage of staff PPE, and extensive cleaning of exam room while observing appropriate contact time as indicated for disinfecting solutions.

## 2021-06-01 ENCOUNTER — Telehealth: Payer: Self-pay | Admitting: *Deleted

## 2021-06-01 NOTE — Telephone Encounter (Signed)
Request Reference Number: [id number]. NURTEC TAB 75MG  ODT is approved through 06/03/2022. Your patient may now fill this prescription and it will be covered.

## 2021-06-01 NOTE — Telephone Encounter (Signed)
Completed Nurtec PA on Cover My Meds. Key: [id number]. Awaiting determination from [facility].

## 2021-06-06 DIAGNOSIS — Z1231 Encounter for screening mammogram for malignant neoplasm of breast: Secondary | ICD-10-CM | POA: Diagnosis not present

## 2021-06-06 LAB — HM MAMMOGRAPHY

## 2021-06-08 ENCOUNTER — Encounter: Payer: Self-pay | Admitting: Family Medicine

## 2021-06-26 ENCOUNTER — Ambulatory Visit (HOSPITAL_BASED_OUTPATIENT_CLINIC_OR_DEPARTMENT_OTHER): Payer: Medicare Other | Admitting: Internal Medicine

## 2021-06-26 ENCOUNTER — Other Ambulatory Visit: Payer: Self-pay

## 2021-06-26 ENCOUNTER — Encounter (HOSPITAL_BASED_OUTPATIENT_CLINIC_OR_DEPARTMENT_OTHER): Payer: Self-pay | Admitting: Internal Medicine

## 2021-06-26 ENCOUNTER — Telehealth (HOSPITAL_BASED_OUTPATIENT_CLINIC_OR_DEPARTMENT_OTHER): Payer: Self-pay

## 2021-06-26 VITALS — BP 126/80 | HR 82 | Ht 65.0 in | Wt 202.1 lb

## 2021-06-26 DIAGNOSIS — E782 Mixed hyperlipidemia: Secondary | ICD-10-CM

## 2021-06-26 DIAGNOSIS — T466X5A Adverse effect of antihyperlipidemic and antiarteriosclerotic drugs, initial encounter: Secondary | ICD-10-CM

## 2021-06-26 DIAGNOSIS — Z944 Liver transplant status: Secondary | ICD-10-CM

## 2021-06-26 DIAGNOSIS — M791 Myalgia, unspecified site: Secondary | ICD-10-CM

## 2021-06-26 MED ORDER — REPATHA SURECLICK 140 MG/ML ~~LOC~~ SOAJ: 1.0000 | SUBCUTANEOUS | 11 refills | Status: DC

## 2021-06-26 NOTE — Telephone Encounter (Signed)
[id number] approved by CMM, through 12/24/2021.

## 2021-06-26 NOTE — Progress Notes (Signed)
LIPID CLINIC CONSULT NOTE  Chief Complaint:  Manage dyslipidemia  Primary Care Physician: Leamon Arnt, MD  Primary Cardiologist:  None  HPI:  Cassandra [name] is a 61 y.o. female who is being seen today for the evaluation of dyslipidemia at the request of Leamon Arnt, MD. this is a pleasant 61 year old female kindly referred for evaluation and management of dyslipidemia.  She has a history of an autoimmune hepatitis and is status post liver transplant I believe more than 20 years ago.  Apparently she had some partial rejection and had about a third of the liver removed however this was a complete liver transplant from her 62 year old donor.  Since then she has had issues with dyslipidemia and high LDL cholesterol.  Unfortunately she has not been able to tolerate statins having tried more than 3 different statins all causing myalgias.  She also tried ezetimibe which caused some muscle cramps nausea and diarrhea.  More recently her cholesterol was noted to be high with total 275, triglycerides 148, HDL 58 and LDL 187.  She has no known cardiovascular disease.  She is on immunosuppressant medications as expected with her transplant including mycophenolate, tacrolimus and low-dose prednisone.  All of these medications could increase her lipids but are necessary cannot be discontinued.  She does report somewhat of a high dietary intake of shellfish and cheese and butter containing products which may be impacting some of her LDL.  PMHx:  Past Medical History:  Diagnosis Date   Anxiety    AS (sickle cell trait) ([place]) 06/29/2019   Autoimmune hepatitis ([place])    Chronic renal insufficiency    Cirrhosis ([place])    COLONIC POLYPS, ADENOMATOUS, HX OF 05/05/2008   Qualifier: Diagnosis of  By: [name], [name]     Depression    External hemorrhoids    Fibromyalgia    GERD (gastroesophageal reflux disease)    Hyperlipidemia    Internal hemorrhoids    Iron deficiency anemia     Liver replaced by transplant ([place]) 02/22/2009   Qualifier: Diagnosis of  By: Chester Holstein NP, Nevin Bloodgood     Migraine    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 She has never used smokeless tobacco. She reports that she does not drink alcohol and does not use drugs.  ALLERGIES:  Allergies  Allergen Reactions   Erythromycin Diarrhea    REACTION: Nausea/vomiting   Ezetimibe Other (See Comments)    Muscle Cramps Nausea Diarrhea   Other Nausea Only and Other (See Comments)    Knocks her out, or makes her sleepy and does not take them. Muscle Cramps Nausea Diarrhea Uncoded Allergy. Allergen: vioxx   Rofecoxib Other (See Comments)  Pt not sure but knows she can not take it. REACTION: Nausea/vomiting   Statins Other (See Comments)    myalgias   Zonisamide Other (See Comments)    Fogginess Fogginess    Elavil [Amitriptyline] Other (See Comments)    groggy   Nsaids     REACTION: GI Cramping/nausea    ROS: Pertinent items noted in HPI and remainder of comprehensive ROS otherwise negative.  HOME MEDS: Current Outpatient Medications on File Prior to Visit  Medication Sig Dispense Refill   diclofenac Sodium (VOLTAREN) 1 % GEL Apply 4 g topically 4 (four) times daily as needed. 350 g 3   fluticasone (FLONASE) 50 MCG/ACT nasal spray SHAKE LIQUID AND USE 2 SPRAYS IN EACH NOSTRIL DAILY 16 g 6    mycophenolate (CELLCEPT) 500 MG tablet Take 500 mg by mouth 2 (two) times daily.     olopatadine (PATANOL) 0.1 % ophthalmic solution INSTILL 1 DROP IN BOTH EYES TWICE DAILY 5 mL 3   Omega-3 Fatty Acids (FISH OIL) 1000 MG CAPS Take 1 capsule by mouth daily.     PredniSONE (DELTASONE PO) Take 5 mg by mouth daily.      Rimegepant Sulfate (NURTEC) 75 MG TBDP Take 75 mg by mouth daily as needed. For migraines. Take as close to onset of migraine as possible. One daily maximum. 8 tablet 0   tacrolimus (PROGRAF) 1 MG capsule Take 2 capsules (2 mg total) by mouth 2 (two) times daily.     cyclobenzaprine (FLEXERIL) 10 MG tablet Take 1 tablet (10 mg total) by mouth 3 (three) times daily as needed. (Patient not taking: Reported on 06/26/2021) 270 tablet 3   levocetirizine (XYZAL) 5 MG tablet Take 1 tablet (5 mg total) by mouth every evening. (Patient not taking: Reported on 06/26/2021) 90 tablet 0   promethazine-dextromethorphan (PROMETHAZINE-DM) 6.25-15 MG/5ML syrup Take 5 mLs by mouth 4 (four) times daily as needed for cough. (Patient not taking: Reported on 06/26/2021) 240 mL 0   No current facility-administered medications on file prior to visit.    LABS/IMAGING: No results found for this or any previous visit (from the past 48 hour(s)). No results found.  LIPID PANEL:    Component Value Date/Time   CHOL 275 (H) 02/28/2021 1027   TRIG 148.0 02/28/2021 1027   HDL 58.40 02/28/2021 1027   CHOLHDL 5 02/28/2021 1027   VLDL 29.6 02/28/2021 1027   LDLCALC 187 (H) 02/28/2021 1027   LDLCALC 189 (H) 02/22/2020 1342   LDLDIRECT 212.0 01/21/2019 1426    WEIGHTS: Wt Readings from Last 3 Encounters:  06/26/21 202 lb 1.6 oz (91.7 kg)  05/08/21 201 lb 9.6 oz (91.4 kg)  02/28/21 204 lb 12.8 oz (92.9 kg)    VITALS: BP 126/80    Pulse 82    Ht 5\' 5"  (1.651 m)    Wt 202 lb 1.6 oz (91.7 kg)    SpO2 98%    BMI 33.63 kg/m   EXAM: General appearance: alert and no distress Neck: no carotid bruit, no JVD, and  thyroid not enlarged, symmetric, no tenderness/mass/nodules Lungs: clear to auscultation bilaterally Heart: regular rate and rhythm Abdomen: soft, non-tender; bowel sounds normal; no masses,  no organomegaly Extremities: extremities normal, atraumatic, no cyanosis or edema Pulses: 2+ and symmetric Skin: Skin color, texture, turgor normal. No rashes or lesions Neurologic: Grossly normal Psych: Pleasant  *Examination chaperoned by Meredith Pel, CMA.  EKG: Deferred  ASSESSMENT: Mixed dyslipidemia History of orthotopic liver transplant on immunosuppressants and steroid Statin and  ezetimibe intolerance-myalgias  PLAN: 1.   Ms. Signer has a mixed dyslipidemia with high LDL cholesterol.  I suspect its related to her immunosuppressive medications and partially diet however cannot exclude a genetic dyslipidemia.  As she has had an entire liver transplant, it is possible that the transplanted liver could be from an individual with a genetic cholesterol disorder.  Nonetheless, she needs additional lipid lowering.  She cannot tolerate statins or ezetimibe due to myalgias.  I think she is a good candidate for PCSK9 inhibitor.  This is safe to use in this population and potentially very effective.  We will plan to start Repatha 140 mg every 2 weeks.  Repeat lipid NMR and LP(a) and plan follow-up in 3 to 4 months  Thanks again for the kind referral.  Pixie Casino, MD, [facility], [place] Director of the Advanced Lipid Disorders &  Cardiovascular Risk Reduction Clinic Diplomate of the American Board of Clinical Lipidology Attending Cardiologist  Direct Dial: [phone]   Fax: [phone]  Website:  [email or web address] Friis 06/26/2021, 10:25 AM

## 2021-06-26 NOTE — Patient Instructions (Signed)
Medication Instructions:  Dr. Debara Pickett recommends Repatha or Praluent (PCSK9). This is an injectable cholesterol medication self-administered once every 14 days. This medication will likely need prior approval with your insurance company, which we will work on. If the medication is not approved initially, we may need to do an appeal with your insurance.   Administer medication in area of fatty tissue such as abdomen, outer thigh, back of upper arm - and rotate site with each injection Store medication in refrigerator until ready to administer - allow to sit at room temp for 30 mins - 1 hour prior to injection Dispose of medication in a SHARPS container - your pharmacy should be able to direct you on this and proper disposal   If you need a co-pay card for Repatha: [email or web address]-[email or web address] >> paying for Repatha or red box that says "[place]" in top right If you need a co-pay card for Praluent: WedMap.it >> starting & paying for Praluent  Patient Assistance:   The PAN Foundation: https://www.panfoundation.org/disease-funds/hypercholesterolemia/ -- can sign up for wait list  The Health Well foundation offers assistance to help pay for medication copays.  They will cover copays for all cholesterol lowering meds, including statins, fibrates, omega-3 fish oils like Vascepa, ezetimibe, Repatha, Praluent, Nexletol, Nexlizet.  The cards are usually good for $2,500 or 12 months, whichever comes first. Go to healthwellfoundation.org Click on Apply Now Answer questions as to whom is applying (patient or representative) Your disease fund will be hypercholesterolemia - Medicare access They will ask questions about finances and which medications you are taking for cholesterol When you submit, the approval is usually within minutes.  You will need to print the card information from the site You will need to show this information to your pharmacy, they will bill your Medicare Part D plan first -then bill  Health Well --for the copay.   You can also call them at [phone], although the hold times can be quite long.     *If you need a refill on your cardiac medications before your next appointment, please call your pharmacy*   Lab Work: FASTING lab work to check cholesterol in about 3-4 months  -- complete one week before your next appointment  If you have labs (blood work) drawn today and your tests are completely normal, you will receive your results only by: [place] (if you have MyChart) OR A paper copy in the mail If you have any lab test that is abnormal or we need to change your treatment, we will call you to review the results.   Testing/Procedures: NONE   Follow-Up: At [facility], you and your health needs are our priority.  As part of our continuing mission to provide you with exceptional heart care, we have created designated Provider Care Teams.  These Care Teams include your primary Cardiologist (physician) and Advanced Practice Providers (APPs -  Physician Assistants and Nurse Practitioners) who all work together to provide you with the care you need, when you need it.  We recommend signing up for the patient portal called "MyChart".  Sign up information is provided on this After Visit Summary.  MyChart is used to connect with patients for Virtual Visits (Telemedicine).  Patients are able to view lab/test results, encounter notes, upcoming appointments, etc.  Non-urgent messages can be sent to your provider as well.   To learn more about what you can do with MyChart, go to [email or web address].    Your next appointment:   3-4 months with Dr.  Hilty -- lipid clinic

## 2021-06-26 NOTE — Telephone Encounter (Signed)
PA submitted by CMM key [id number].

## 2021-07-03 ENCOUNTER — Telehealth: Payer: Self-pay | Admitting: Family Medicine

## 2021-07-03 NOTE — Telephone Encounter (Signed)
Copied from [place] [id number]. Topic: Medicare AWV >> Jul 03, 2021  2:04 PM Harris-Coley, Hannah Beat wrote: Reason for CRM: Left message for patient to schedule Annual Wellness Visit.  Please schedule with Nurse Health Advisor Charlott Rakes, RN at [facility].  Please call [phone] ask for [facility]

## 2021-07-06 ENCOUNTER — Ambulatory Visit: Payer: Medicare Other

## 2021-07-06 ENCOUNTER — Other Ambulatory Visit: Payer: Self-pay

## 2021-07-06 ENCOUNTER — Ambulatory Visit (INDEPENDENT_AMBULATORY_CARE_PROVIDER_SITE_OTHER): Payer: Medicare Other

## 2021-07-06 DIAGNOSIS — Z0000 Encounter for general adult medical examination without abnormal findings: Secondary | ICD-10-CM

## 2021-07-06 NOTE — Patient Instructions (Addendum)
Cassandra [name] , Thank you for taking time to come for your Medicare Wellness Visit. I appreciate your ongoing commitment to your health goals. Please review the following plan we discussed and let me know if I can assist you in the future.   Screening recommendations/referrals: Colonoscopy: Done 01/24/21 repeat every 10 years Mammogram: done 06/06/21 repeat every year  Bone Density: Done 04/16/14 repeat every 2 years  Recommended yearly ophthalmology/optometry visit for glaucoma screening and checkup Recommended yearly dental visit for hygiene and checkup  Vaccinations: Influenza vaccine: Done 03/28/16 repeat every year  Pneumococcal vaccine: Up to date Tdap vaccine: Done 05/04/20 repeat every 10 years  Shingles vaccine: Shingrix discussed. Please contact your pharmacy for coverage information.    Covid-19:Completed 3/25, 09/17/19 & 07/19/20  Advanced directives: Please bring a copy of your health care power of attorney and living will to the office at your convenience.  Conditions/risks identified: lose weight and better diet   Next appointment: Follow up in one year for your annual wellness visit    Preventive Care 65 Years and Older, Female Preventive care refers to lifestyle choices and visits with your health care provider that can promote health and wellness. What does preventive care include? A yearly physical exam. This is also called an annual well check. Dental exams once or twice a year. Routine eye exams. Ask your health care provider how often you should have your eyes checked. Personal lifestyle choices, including: Daily care of your teeth and gums. Regular physical activity. Eating a healthy diet. Avoiding tobacco and drug use. Limiting alcohol use. Practicing safe sex. Taking low-dose aspirin every day. Taking vitamin and mineral supplements as recommended by your health care provider. What happens during an annual well check? The services and screenings done by your  health care provider during your annual well check will depend on your age, overall health, lifestyle risk factors, and family history of disease. Counseling  Your health care provider may ask you questions about your: Alcohol use. Tobacco use. Drug use. Emotional well-being. Home and relationship well-being. Sexual activity. Eating habits. History of falls. Memory and ability to understand (cognition). Work and work Statistician. Reproductive health. Screening  You may have the following tests or measurements: Height, weight, and BMI. Blood pressure. Lipid and cholesterol levels. These may be checked every 5 years, or more frequently if you are over 33 years old. Skin check. Lung cancer screening. You may have this screening every year starting at age 73 if you have a 30-pack-year history of smoking and currently smoke or have quit within the past 15 years. Fecal occult blood test (FOBT) of the stool. You may have this test every year starting at age 31. Flexible sigmoidoscopy or colonoscopy. You may have a sigmoidoscopy every 5 years or a colonoscopy every 10 years starting at age 41. Hepatitis C blood test. Hepatitis B blood test. Sexually transmitted disease (STD) testing. Diabetes screening. This is done by checking your blood sugar (glucose) after you have not eaten for a while (fasting). You may have this done every 1-3 years. Bone density scan. This is done to screen for osteoporosis. You may have this done starting at age 82. Mammogram. This may be done every 1-2 years. Talk to your health care provider about how often you should have regular mammograms. Talk with your health care provider about your test results, treatment options, and if necessary, the need for more tests. Vaccines  Your health care provider may recommend certain vaccines, such as: Influenza vaccine.  This is recommended every year. Tetanus, diphtheria, and acellular pertussis (Tdap, Td) vaccine. You may  need a Td booster every 10 years. Zoster vaccine. You may need this after age 51. Pneumococcal 13-valent conjugate (PCV13) vaccine. One dose is recommended after age 42. Pneumococcal polysaccharide (PPSV23) vaccine. One dose is recommended after age 80. Talk to your health care provider about which screenings and vaccines you need and how often you need them. This information is not intended to replace advice given to you by your health care provider. Make sure you discuss any questions you have with your health care provider. Document Released: 06/17/2015 Document Revised: 02/08/2016 Document Reviewed: 03/22/2015 Elsevier Interactive Patient Education  2017 [employer] Prevention in the Home Falls can cause injuries. They can happen to people of all ages. There are many things you can do to make your home safe and to help prevent falls. What can I do on the outside of my home? Regularly fix the edges of walkways and driveways and fix any cracks. Remove anything that might make you trip as you walk through a door, such as a raised step or threshold. Trim any bushes or trees on the path to your home. Use bright outdoor lighting. Clear any walking paths of anything that might make someone trip, such as rocks or tools. Regularly check to see if handrails are loose or broken. Make sure that both sides of any steps have handrails. Any raised decks and porches should have guardrails on the edges. Have any leaves, snow, or ice cleared regularly. Use sand or salt on walking paths during winter. Clean up any spills in your garage right away. This includes oil or grease spills. What can I do in the bathroom? Use night lights. Install grab bars by the toilet and in the tub and shower. Do not use towel bars as grab bars. Use non-skid mats or decals in the tub or shower. If you need to sit down in the shower, use a plastic, non-slip stool. Keep the floor dry. Clean up any water that spills on  the floor as soon as it happens. Remove soap buildup in the tub or shower regularly. Attach bath mats securely with double-sided non-slip rug tape. Do not have throw rugs and other things on the floor that can make you trip. What can I do in the bedroom? Use night lights. Make sure that you have a light by your bed that is easy to reach. Do not use any sheets or blankets that are too big for your bed. They should not hang down onto the floor. Have a firm chair that has side arms. You can use this for support while you get dressed. Do not have throw rugs and other things on the floor that can make you trip. What can I do in the kitchen? Clean up any spills right away. Avoid walking on wet floors. Keep items that you use a lot in easy-to-reach places. If you need to reach something above you, use a strong step stool that has a grab bar. Keep electrical cords out of the way. Do not use floor polish or wax that makes floors slippery. If you must use wax, use non-skid floor wax. Do not have throw rugs and other things on the floor that can make you trip. What can I do with my stairs? Do not leave any items on the stairs. Make sure that there are handrails on both sides of the stairs and use them. Fix handrails  that are broken or loose. Make sure that handrails are as long as the stairways. Check any carpeting to make sure that it is firmly attached to the stairs. Fix any carpet that is loose or worn. Avoid having throw rugs at the top or bottom of the stairs. If you do have throw rugs, attach them to the floor with carpet tape. Make sure that you have a light switch at the top of the stairs and the bottom of the stairs. If you do not have them, ask someone to add them for you. What else can I do to help prevent falls? Wear shoes that: Do not have high heels. Have rubber bottoms. Are comfortable and fit you well. Are closed at the toe. Do not wear sandals. If you use a stepladder: Make sure  that it is fully opened. Do not climb a closed stepladder. Make sure that both sides of the stepladder are locked into place. Ask someone to hold it for you, if possible. Clearly mark and make sure that you can see: Any grab bars or handrails. First and last steps. Where the edge of each step is. Use tools that help you move around (mobility aids) if they are needed. These include: Canes. Walkers. Scooters. Crutches. Turn on the lights when you go into a dark area. Replace any light bulbs as soon as they burn out. Set up your furniture so you have a clear path. Avoid moving your furniture around. If any of your floors are uneven, fix them. If there are any pets around you, be aware of where they are. Review your medicines with your doctor. Some medicines can make you feel dizzy. This can increase your chance of falling. Ask your doctor what other things that you can do to help prevent falls. This information is not intended to replace advice given to you by your health care provider. Make sure you discuss any questions you have with your health care provider. Document Released: 03/17/2009 Document Revised: 10/27/2015 Document Reviewed: 06/25/2014 Elsevier Interactive Patient Education  2017 [employer].

## 2021-07-06 NOTE — Progress Notes (Signed)
Virtual Visit via Telephone Note  I connected with  Cassandra [name] on 07/06/21 at 11:45 AM EST by telephone and verified that I am speaking with the correct person using two identifiers.  Medicare Annual Wellness visit completed telephonically due to Covid-19 pandemic.   Persons participating in this call: This Health Coach and this patient.   Location: Patient: home Provider: office   I discussed the limitations, risks, security and privacy concerns of performing an evaluation and management service by telephone and the availability of in person appointments. The patient expressed understanding and agreed to proceed.  Unable to perform video visit due to video visit attempted and failed and/or patient does not have video capability.   Some vital signs may be absent or patient reported.   Willette Brace, LPN   Subjective:   Cassandra [name] is a 61 y.o. female who presents for Medicare Annual (Subsequent) preventive examination.  Review of Systems     Cardiac Risk Factors include: dyslipidemia;obesity (BMI >30kg/m2)     Objective:    There were no vitals filed for this visit. There is no height or weight on file to calculate BMI.  Advanced Directives 07/06/2021 11/20/2019 05/22/2019 06/29/2018 05/07/2018 10/09/2016  Does Patient Have a Medical Advance Directive? Yes No Yes No No No  Type of Advance Directive Healthcare Power of [place]  Does patient want to make changes to medical advance directive? - - No - Patient declined - - -  Copy of [place] in Chart? No - copy requested - No - copy requested - - -  Would patient like information on creating a medical advance directive? - No - Patient declined - No - Patient declined - No - Patient declined    Current Medications (verified) Outpatient Encounter Medications as of 07/06/2021  Medication Sig   diclofenac Sodium (VOLTAREN) 1 % GEL Apply 4 g topically 4  (four) times daily as needed.   fluticasone (FLONASE) 50 MCG/ACT nasal spray SHAKE LIQUID AND USE 2 SPRAYS IN EACH NOSTRIL DAILY   levocetirizine (XYZAL) 5 MG tablet Take 1 tablet (5 mg total) by mouth every evening.   mycophenolate (CELLCEPT) 500 MG tablet Take 500 mg by mouth 2 (two) times daily.   PredniSONE (DELTASONE PO) Take 5 mg by mouth daily.    tacrolimus (PROGRAF) 1 MG capsule Take 2 capsules (2 mg total) by mouth 2 (two) times daily.   Evolocumab (REPATHA SURECLICK) 263 MG/ML SOAJ Inject 1 Dose into the skin every 14 (fourteen) days. (Patient not taking: Reported on 07/06/2021)   olopatadine (PATANOL) 0.1 % ophthalmic solution INSTILL 1 DROP IN BOTH EYES TWICE DAILY   Omega-3 Fatty Acids (FISH OIL) 1000 MG CAPS Take 1 capsule by mouth daily.   Rimegepant Sulfate (NURTEC) 75 MG TBDP Take 75 mg by mouth daily as needed. For migraines. Take as close to onset of migraine as possible. One daily maximum. (Patient not taking: Reported on 07/06/2021)   [DISCONTINUED] cyclobenzaprine (FLEXERIL) 10 MG tablet Take 1 tablet (10 mg total) by mouth 3 (three) times daily as needed. (Patient not taking: Reported on 06/26/2021)   [DISCONTINUED] promethazine-dextromethorphan (PROMETHAZINE-DM) 6.25-15 MG/5ML syrup Take 5 mLs by mouth 4 (four) times daily as needed for cough. (Patient not taking: Reported on 06/26/2021)   No facility-administered encounter medications on file as of 07/06/2021.    Allergies (verified) Erythromycin, Ezetimibe, Other, Rofecoxib, Statins, Zonisamide, Elavil [amitriptyline], and Nsaids   History:  Past Medical History:  Diagnosis Date   Anxiety    AS (sickle cell trait) ([place]) 06/29/2019   Autoimmune hepatitis ([place])    Chronic renal insufficiency    Cirrhosis ([place])    COLONIC POLYPS, ADENOMATOUS, HX OF 05/05/2008   Qualifier: Diagnosis of  By: [name], [name]     Depression    External hemorrhoids    Fibromyalgia    GERD (gastroesophageal reflux disease)     Hyperlipidemia    Internal hemorrhoids    Iron deficiency anemia    Liver replaced by transplant ([place]) 02/22/2009   Qualifier: Diagnosis of  By: Chester Holstein NP, Nevin Bloodgood     Migraine    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    Ruptured disc, cervical    multiple levels   Statin intolerance 05/13/2018   Past Surgical History:  Procedure Laterality Date   ABDOMINAL HYSTERECTOMY  2010   KNEE ARTHROSCOPY Right 2010   LIVER TRANSPLANT  1992   ROTATOR CUFF REPAIR Left 2010   x2   Family History  Problem Relation Age of Onset   Diabetes Brother    Heart disease Sister    Hypertension Sister    Diabetes Mother    Dementia Mother    Alzheimer's disease Mother    Gout Mother    Hypertension Mother    Stroke Mother    Cancer Sister    Colon cancer Neg Hx    Social History   Socioeconomic History   Marital status: Single    Spouse name: Not on file   Number of children: 1   Years of education: Not on file   Highest education level: Associate degree: academic program  Occupational History   Not on file  Tobacco Use   Smoking status: Never   Smokeless tobacco: Never  Vaping Use   Vaping Use: Never used  Substance and Sexual Activity   Alcohol use: Never    Alcohol/week: 0.0 standard drinks   Drug use: Never   Sexual activity: Not on file  Other Topics Concern   Not on file  Social History Narrative   Lives at home alone    Right handed   Caffeine: none   Social Determinants of Health   Financial Resource Strain: Low Risk    Difficulty of Paying Living Expenses: Not hard at all  Food Insecurity: No Food Insecurity   Worried About Charity fundraiser in the Last Year: Never true   Ran Out of Food in the Last Year: Never true  Transportation Needs: No Transportation Needs   Lack of Transportation (Medical): No   Lack of Transportation (Non-Medical): No  Physical Activity: Insufficiently Active   Days of Exercise per Week: 2 days   Minutes of Exercise per Session: 30 min   Stress: No Stress Concern Present   Feeling of Stress : Not at all  Social Connections: Moderately Isolated   Frequency of Communication with Friends and Family: More than three times a week   Frequency of Social Gatherings with Friends and Family: More than three times a week   Attends Religious Services: More than 4 times per year   Active Member of [employer] or Organizations: No   Attends Archivist Meetings: Never   Marital Status: Never married    Tobacco Counseling Counseling given: Not Answered   Clinical Intake:  Pre-visit preparation completed: Yes  Pain : No/denies pain     BMI - recorded: 33.63 Nutritional Status: BMI > 30  Obese  Nutritional Risks: None Diabetes: No  How often do you need to have someone help you when you read instructions, pamphlets, or other written materials from your doctor or pharmacy?: 1 - Never  Diabetic?no  Interpreter Needed?: No  Information entered by :: Charlott Rakes, LPN   Activities of Daily Living In your present state of health, do you have any difficulty performing the following activities: 07/06/2021 08/24/2020  Hearing? N N  Vision? N N  Difficulty concentrating or making decisions? N N  Walking or climbing stairs? Y N  Comment a while  to climb -  Dressing or bathing? N N  Doing errands, shopping? N N  Preparing Food and eating ? N -  Using the Toilet? N -  In the past six months, have you accidently leaked urine? N -  Do you have problems with loss of bowel control? N -  Managing your Medications? N -  Managing your Finances? N -  Housekeeping or managing your Housekeeping? N -  Some recent data might be hidden    Patient Care Team: Leamon Arnt, MD as PCP - General (Family Medicine) Mauri Pole, MD as Consulting Physician (Gastroenterology) Lavonia Drafts (Pain Medicine) Melvenia Beam, MD as Consulting Physician (Neurology) Teena Irani, MD as Consulting Physician (Internal  Medicine) Marko Stai (Physician Assistant)  Indicate any recent Medical Services you may have received from other than [facility] providers in the past year (date may be approximate).     Assessment:   This is a routine wellness examination for Jaylie.  Hearing/Vision screen Hearing Screening - Comments:: Pt denies any hearing issues  Vision Screening - Comments:: Pt follows up with [facility] for annual eye exams   Dietary issues and exercise activities discussed: Current Exercise Habits: Home exercise routine, Type of exercise: walking, Time (Minutes): 30, Frequency (Times/Week): 2, Weekly Exercise (Minutes/Week): 60   Goals Addressed             This Visit's Progress    Patient Stated       Lose weight and better diet        Depression Screen PHQ 2/9 Scores 07/06/2021 05/08/2021 08/24/2020 02/22/2020 12/04/2019 05/22/2019 10/17/2017  PHQ - 2 Score 0 0 0 0 0 0 0  PHQ- 9 Score - - - - 0 - 0    Fall Risk Fall Risk  07/06/2021 05/08/2021 08/24/2020 11/23/2019 06/10/2019  Falls in the past year? 0 - 0 0 1  Number falls in past yr: 0 0 0 0 0  Injury with Fall? 0 0 0 0 1  Risk for fall due to : Impaired vision - - - Other (Comment)  Risk for fall due to: Comment - - - - tripped over dog  Follow up Falls prevention discussed - - - Falls evaluation completed    [place]:  Any stairs in or around the home? No  If so, are there any without handrails? No  Home free of loose throw rugs in walkways, pet beds, electrical cords, etc? Yes  Adequate lighting in your home to reduce risk of falls? Yes   ASSISTIVE DEVICES UTILIZED TO PREVENT FALLS:  Life alert? Yes  Use of a cane, walker or w/c? Yes  Grab bars in the bathroom? Yes  Shower chair or bench in shower? No  Elevated toilet seat or a handicapped toilet? No   TIMED UP AND GO:  Was the test performed? No .  Cognitive Function:     6CIT Screen 07/06/2021  What Year? 0 points   What month? 0 points  What time? 0 points  Count back from 20 0 points  Months in reverse 0 points  Repeat phrase 0 points  Total Score 0    Immunizations Immunization History  Administered Date(s) Administered   Hepatitis A, Adult 09/27/2016, 09/26/2017   Influenza Split 03/05/2012   Influenza, Quadrivalent, Recombinant, Inj, Pf 04/29/2013   Influenza, Seasonal, Injecte, Preservative Fre 03/02/2014, 02/28/2015   Influenza,inj,Quad PF,6+ Mos 03/28/2016   Influenza-Unspecified 05/26/2013, 03/28/2016   PFIZER Comirnaty(Gray Top)Covid-19 Tri-Sucrose Vaccine 08/27/2019, 09/17/2019, 07/19/2020   PFIZER(Purple Top)SARS-COV-2 Vaccination 08/27/2019, 09/17/2019   Pneumococcal Conjugate-13 09/22/2015   Pneumococcal Polysaccharide-23 09/27/2016   Tdap 05/17/2008, 12/03/2010   Tetanus 05/04/2020    TDAP status: Up to date  Flu Vaccine status: Up to date  Pneumococcal vaccine status: Up to date  Covid-19 vaccine status: Completed vaccines  Qualifies for Shingles Vaccine? Yes   Zostavax completed No   Shingrix Completed?: No.    Education has been provided regarding the importance of this vaccine. Patient has been advised to call insurance company to determine out of pocket expense if they have not yet received this vaccine. Advised may also receive vaccine at local pharmacy or Health Dept. Verbalized acceptance and understanding.  Screening Tests Health Maintenance  Topic Date Due   Zoster Vaccines- Shingrix (1 of 2) Never done   COVID-19 Vaccine (6 - Booster) 09/13/2020   MAMMOGRAM  06/06/2022   TETANUS/TDAP  05/04/2030   COLONOSCOPY (Pts 45-59yrs Insurance coverage will need to be confirmed)  01/25/2031   Hepatitis C Screening  Completed   HIV Screening  Completed   HPV VACCINES  Aged Out   INFLUENZA VACCINE  Discontinued    Health Maintenance  Health Maintenance Due  Topic Date Due   Zoster Vaccines- Shingrix (1 of 2) Never done   COVID-19 Vaccine (6 - Booster)  09/13/2020    Colorectal cancer screening: Type of screening: Colonoscopy. Completed 01/24/21. Repeat every 10 years  Mammogram status: Completed 06/06/21. Repeat every year  Bone Density status: Completed 04/16/14. Results reflect: Bone density results: NORMAL. Repeat every 0 years.    Additional Screening:  Hepatitis C Screening: Completed 02/24/21  Vision Screening: Recommended annual ophthalmology exams for early detection of glaucoma and other disorders of the eye. Is the patient up to date with their annual eye exam?  Yes  Who is the provider or what is the name of the office in which the patient attends annual eye exams? [facility]  If pt is not established with a provider, would they like to be referred to a provider to establish care? No .   Dental Screening: Recommended annual dental exams for proper oral hygiene  Community Resource Referral / Chronic Care Management: CRR required this visit?  No   CCM required this visit?  No      Plan:     I have personally reviewed and noted the following in the patients chart:   Medical and social history Use of alcohol, tobacco or illicit drugs  Current medications and supplements including opioid prescriptions.  Functional ability and status Nutritional status Physical activity Advanced directives List of other physicians Hospitalizations, surgeries, and ER visits in previous 12 months Vitals Screenings to include cognitive, depression, and falls Referrals and appointments  In addition, I have reviewed and discussed with patient certain preventive protocols, quality metrics, and best practice recommendations. A written personalized care plan  for preventive services as well as general preventive health recommendations were provided to patient.     Willette Brace, LPN   08/09/9430   Nurse Notes: None

## 2021-07-24 DIAGNOSIS — D849 Immunodeficiency, unspecified: Secondary | ICD-10-CM | POA: Diagnosis not present

## 2021-07-24 DIAGNOSIS — Z944 Liver transplant status: Secondary | ICD-10-CM | POA: Diagnosis not present

## 2021-07-27 ENCOUNTER — Emergency Department (HOSPITAL_COMMUNITY): Payer: Medicare Other

## 2021-07-27 ENCOUNTER — Encounter (HOSPITAL_COMMUNITY): Payer: Self-pay

## 2021-07-27 ENCOUNTER — Emergency Department (HOSPITAL_COMMUNITY)
Admission: EM | Admit: 2021-07-27 | Discharge: 2021-07-27 | Disposition: A | Discharge status: Other Acute Inpatient Hospital | Payer: Medicare Other | Attending: Emergency Medicine | Admitting: Emergency Medicine

## 2021-07-27 DIAGNOSIS — Z743 Need for continuous supervision: Secondary | ICD-10-CM | POA: Diagnosis not present

## 2021-07-27 DIAGNOSIS — M549 Dorsalgia, unspecified: Secondary | ICD-10-CM | POA: Diagnosis not present

## 2021-07-27 DIAGNOSIS — Z79899 Other long term (current) drug therapy: Secondary | ICD-10-CM | POA: Diagnosis not present

## 2021-07-27 DIAGNOSIS — M47814 Spondylosis without myelopathy or radiculopathy, thoracic region: Secondary | ICD-10-CM | POA: Diagnosis not present

## 2021-07-27 DIAGNOSIS — R079 Chest pain, unspecified: Secondary | ICD-10-CM | POA: Insufficient documentation

## 2021-07-27 DIAGNOSIS — I700 Atherosclerosis of aorta: Secondary | ICD-10-CM | POA: Diagnosis not present

## 2021-07-27 DIAGNOSIS — M25511 Pain in right shoulder: Secondary | ICD-10-CM | POA: Diagnosis not present

## 2021-07-27 DIAGNOSIS — M79605 Pain in left leg: Secondary | ICD-10-CM | POA: Diagnosis not present

## 2021-07-27 DIAGNOSIS — N281 Cyst of kidney, acquired: Secondary | ICD-10-CM | POA: Diagnosis not present

## 2021-07-27 DIAGNOSIS — I10 Essential (primary) hypertension: Secondary | ICD-10-CM | POA: Diagnosis not present

## 2021-07-27 DIAGNOSIS — R200 Anesthesia of skin: Secondary | ICD-10-CM | POA: Diagnosis not present

## 2021-07-27 DIAGNOSIS — G459 Transient cerebral ischemic attack, unspecified: Secondary | ICD-10-CM | POA: Diagnosis not present

## 2021-07-27 DIAGNOSIS — R0789 Other chest pain: Secondary | ICD-10-CM | POA: Diagnosis not present

## 2021-07-27 DIAGNOSIS — R6889 Other general symptoms and signs: Secondary | ICD-10-CM | POA: Diagnosis not present

## 2021-07-27 DIAGNOSIS — R531 Weakness: Secondary | ICD-10-CM | POA: Diagnosis not present

## 2021-07-27 DIAGNOSIS — M25512 Pain in left shoulder: Secondary | ICD-10-CM | POA: Insufficient documentation

## 2021-07-27 DIAGNOSIS — K314 Gastric diverticulum: Secondary | ICD-10-CM | POA: Diagnosis not present

## 2021-07-27 DIAGNOSIS — I499 Cardiac arrhythmia, unspecified: Secondary | ICD-10-CM | POA: Diagnosis not present

## 2021-07-27 DIAGNOSIS — K449 Diaphragmatic hernia without obstruction or gangrene: Secondary | ICD-10-CM | POA: Diagnosis not present

## 2021-07-27 DIAGNOSIS — R000 Tachycardia, unspecified: Secondary | ICD-10-CM | POA: Diagnosis not present

## 2021-07-27 LAB — CBC
HCT: 43.4 % (ref 36.0–46.0)
Hemoglobin: 14.9 g/dL (ref 12.0–15.0)
MCH: 28.9 pg (ref 26.0–34.0)
MCHC: 34.3 g/dL (ref 30.0–36.0)
MCV: 84.3 fL (ref 80.0–100.0)
Platelets: 179 10*3/uL (ref 150–400)
RBC: 5.15 MIL/uL — ABNORMAL HIGH (ref 3.87–5.11)
RDW: 12.8 % (ref 11.5–15.5)
WBC: 6.3 10*3/uL (ref 4.0–10.5)
nRBC: 0 % (ref 0.0–0.2)

## 2021-07-27 LAB — COMPREHENSIVE METABOLIC PANEL
ALT: 25 U/L (ref 0–44)
AST: 26 U/L (ref 15–41)
Albumin: 4.2 g/dL (ref 3.5–5.0)
Alkaline Phosphatase: 81 U/L (ref 38–126)
Anion gap: 12 (ref 5–15)
BUN: 24 mg/dL — ABNORMAL HIGH (ref 8–23)
CO2: 18 mmol/L — ABNORMAL LOW (ref 22–32)
Calcium: 9.4 mg/dL (ref 8.9–10.3)
Chloride: 101 mmol/L (ref 98–111)
Creatinine, Ser: 1.29 mg/dL — ABNORMAL HIGH (ref 0.44–1.00)
GFR, Estimated: 47 mL/min — ABNORMAL LOW (ref >60)
Glucose, Bld: 139 mg/dL — ABNORMAL HIGH (ref 70–99)
Potassium: 4.1 mmol/L (ref 3.5–5.1)
Sodium: 131 mmol/L — ABNORMAL LOW (ref 135–145)
Total Bilirubin: 0.6 mg/dL (ref 0.3–1.2)
Total Protein: 7.2 g/dL (ref 6.5–8.1)

## 2021-07-27 LAB — DIFFERENTIAL
Abs Immature Granulocytes: 0.03 10*3/uL (ref 0.00–0.07)
Basophils Absolute: 0 10*3/uL (ref 0.0–0.1)
Basophils Relative: 1 %
Eosinophils Absolute: 0 10*3/uL (ref 0.0–0.5)
Eosinophils Relative: 1 %
Immature Granulocytes: 1 %
Lymphocytes Relative: 22 %
Lymphs Abs: 1.4 10*3/uL (ref 0.7–4.0)
Monocytes Absolute: 0.4 10*3/uL (ref 0.1–1.0)
Monocytes Relative: 7 %
Neutro Abs: 4.4 10*3/uL (ref 1.7–7.7)
Neutrophils Relative %: 68 %

## 2021-07-27 LAB — APTT: aPTT: 24 seconds (ref 24–36)

## 2021-07-27 LAB — TROPONIN I (HIGH SENSITIVITY)
Troponin I (High Sensitivity): 5 ng/L (ref <18)
Troponin I (High Sensitivity): 6 ng/L (ref <18)

## 2021-07-27 LAB — PROTIME-INR
INR: 1 (ref 0.8–1.2)
Prothrombin Time: 12.6 seconds (ref 11.4–15.2)

## 2021-07-27 MED ORDER — FENTANYL CITRATE PF 50 MCG/ML IJ SOSY
50.0000 ug | PREFILLED_SYRINGE | Freq: Once | INTRAMUSCULAR | Status: AC
  Administered 2021-07-27: 50 ug via INTRAVENOUS
  Filled 2021-07-27: qty 1

## 2021-07-27 MED ORDER — IOHEXOL 350 MG/ML SOLN
100.0000 mL | Freq: Once | INTRAVENOUS | Status: AC | PRN
  Administered 2021-07-27: 100 mL via INTRAVENOUS

## 2021-07-27 NOTE — ED Notes (Signed)
Dr. Regenia Skeeter spoke with pt in reference to pt's concern with blood pressure and questions about starting medication for blood pressure. Plan to follow-up with PCP.

## 2021-07-27 NOTE — Discharge Instructions (Addendum)
If you develop recurrent, continued, or worsening chest pain, shortness of breath, fever, vomiting, abdominal or back pain, weakness or numbness in your arms or legs, or any other new/concerning symptoms then return to the ER for evaluation.

## 2021-07-27 NOTE — ED Triage Notes (Signed)
Pt arrives via [facility] for c/o L sided CP radiating down L arm. Initially, pt reported L arm numbness, which has since resolved. Pt given 2 - 81 mg ASA with EMS.   EMS last VS - HR 122, 160/110, RR 22, 100% on RA  #20 R AC by EMS.

## 2021-07-27 NOTE — ED Provider Notes (Signed)
[facility] EMERGENCY DEPARTMENT Provider Note   CSN: [id number] Arrival date & time: 07/27/21  1432     History  Chief Complaint  Patient presents with   Chest Pain    Cassandra [name] is a 61 y.o. female.  HPI 61 year old female with a history of a liver transplant 30+ years ago as well as hyperlipidemia and prediabetes presents with chest pain and weakness.  She states this started around noon.  Was going for a hair appointment and noticed pain primarily in both arms/shoulders and her chest.  Some in her left back behind her shoulder.  Pain was so bad at 1 point that both arms felt weak and seemed like they were contracting.  When she got home her left leg was weak to the point she could not support her weight and could not walk on it.  Thus she was saying that her left leg and both arms were weak and some numbness.  The leg weakness lasted about 15 minutes.  Her symptoms of weakness have resolved though she still has the pain in her chest, bilateral shoulders and back.  No headache during this time.  No shortness of breath.  No known history of CAD or history of early heart disease in the family.  Home Medications Prior to Admission medications   Medication Sig Start Date End Date Taking? Authorizing Provider  diclofenac Sodium (VOLTAREN) 1 % GEL Apply 4 g topically 4 (four) times daily as needed. 02/22/20   Leamon Arnt, MD  Evolocumab (REPATHA SURECLICK) 240 MG/ML SOAJ Inject 1 Dose into the skin every 14 (fourteen) days. Patient not taking: Reported on 07/06/2021 06/26/21   Pixie Casino, MD  fluticasone [facility]) 50 MCG/ACT nasal spray SHAKE LIQUID AND USE 2 SPRAYS IN [facility] NOSTRIL DAILY 05/11/20   Leamon Arnt, MD  levocetirizine (XYZAL) 5 MG tablet Take 1 tablet (5 mg total) by mouth every evening. 02/10/21   Scot Jun, FNP  mycophenolate (CELLCEPT) 500 MG tablet Take 500 mg by mouth 2 (two) times daily.    [provider]  olopatadine  (PATANOL) 0.1 % ophthalmic solution INSTILL 1 DROP IN BOTH EYES TWICE DAILY 07/28/20   Leamon Arnt, MD  Omega-3 Fatty Acids (FISH OIL) 1000 MG CAPS Take 1 capsule by mouth daily. 09/02/15   [provider]  PredniSONE (DELTASONE PO) Take 5 mg by mouth daily.     [provider]  Rimegepant Sulfate (NURTEC) 75 MG TBDP Take 75 mg by mouth daily as needed. For migraines. Take as close to onset of migraine as possible. One daily maximum. Patient not taking: Reported on 07/06/2021 06/23/20   Melvenia Beam, MD  tacrolimus (PROGRAF) 1 MG capsule Take 2 capsules (2 mg total) by mouth 2 (two) times daily. 02/22/20   Leamon Arnt, MD      Allergies    Erythromycin, Ezetimibe, Other, Rofecoxib, Statins, Zonisamide, Elavil [amitriptyline], and Nsaids    Review of Systems   Review of Systems  Constitutional:  Negative for fever.  Respiratory:  Negative for shortness of breath.   Cardiovascular:  Positive for chest pain.  Gastrointestinal:  Negative for abdominal pain.  Musculoskeletal:  Positive for arthralgias and back pain.  Neurological:  Positive for weakness and numbness. Negative for headaches.   Physical Exam Updated Vital Signs BP (!) 154/90 (BP Location: Right Arm)    Pulse 90    Temp 98.9 F (37.2 C) (Oral)    Resp 16  SpO2 99%  Physical Exam Vitals and nursing note reviewed.  Constitutional:      General: She is not in acute distress.    Appearance: She is well-developed. She is not ill-appearing or diaphoretic.  HENT:     Head: Normocephalic and atraumatic.  Cardiovascular:     Rate and Rhythm: Regular rhythm. Tachycardia present.     Pulses:          Radial pulses are 2+ on the right side and 2+ on the left side.     Heart sounds: Normal heart sounds.     Comments: HR low 100s Pulmonary:     Effort: Pulmonary effort is normal.     Breath sounds: Normal breath sounds.  Abdominal:     Palpations: Abdomen is soft.     Tenderness: There is no abdominal  tenderness.  Musculoskeletal:     Right lower leg: No edema.     Left lower leg: No edema.  Skin:    General: Skin is warm and dry.  Neurological:     Mental Status: She is alert.     Comments: CN 3-12 grossly intact. 5/5 strength in all 4 extremities. Grossly normal sensation. Normal finger to nose. Normal gait    ED Results / Procedures / Treatments   Labs (all labs ordered are listed, but only abnormal results are displayed) Labs Reviewed  CBC - Abnormal; Notable for the following components:      Result Value   RBC 5.15 (*)    All other components within normal limits  COMPREHENSIVE METABOLIC PANEL - Abnormal; Notable for the following components:   Sodium 131 (*)    CO2 18 (*)    Glucose, Bld 139 (*)    BUN 24 (*)    Creatinine, Ser 1.29 (*)    GFR, Estimated 47 (*)    All other components within normal limits  PROTIME-INR  APTT  DIFFERENTIAL  TROPONIN I (HIGH SENSITIVITY)  TROPONIN I (HIGH SENSITIVITY)    EKG EKG Interpretation  Date/Time:  Thursday July 27 2021 14:41:12 EST Ventricular Rate:  115 PR Interval:  152 QRS Duration: 76 QT Interval:  316 QTC Calculation: 437 R Axis:   0 Text Interpretation: Sinus tachycardia Minimal voltage criteria for LVH, may be normal variant ( R in aVL ) Cannot rule out Anterior infarct , age undetermined Abnormal ECG  rate is faster, otherwise ECG is unchanged since 2019 Confirmed by Sherwood Gambler [phone]) on 07/27/2021 4:39:47 PM  Radiology DG Chest 1 View  Result Date: 07/27/2021 CLINICAL DATA:  Chest pain. EXAM: CHEST  1 VIEW COMPARISON:  August 01, 2015. FINDINGS: No consolidation. No visible pleural effusions or pneumothorax. Cardiomediastinal silhouette is within normal limits and similar to prior. Surgical clip projects at the medial right upper quadrant, unchanged. Shoulder and spine degenerative change. IMPRESSION: No evidence of acute cardiopulmonary disease. Electronically Signed   By: Margaretha Sheffield M.D.    On: 07/27/2021 15:06   CT Head Wo Contrast  Result Date: 07/27/2021 CLINICAL DATA:  Transient ischemic attack. EXAM: CT HEAD WITHOUT CONTRAST TECHNIQUE: Contiguous axial images were obtained from the base of the skull through the vertex without intravenous contrast. RADIATION DOSE REDUCTION: This exam was performed according to the departmental dose-optimization program which includes automated exposure control, adjustment of the mA and/or kV according to patient size and/or use of iterative reconstruction technique. COMPARISON:  Head CT 05/07/2018 FINDINGS: Brain: Brain volume is normal for age. No intracranial hemorrhage, mass effect, or midline shift.  No hydrocephalus. The basilar cisterns are patent. No evidence of territorial infarct or acute ischemia. No extra-axial or intracranial fluid collection. Vascular: No hyperdense vessel or unexpected calcification. Skull: No fracture or focal lesion. Sinuses/Orbits: Paranasal sinuses and mastoid air cells are clear. The visualized orbits are unremarkable. Other: Incidental note of non fusion anterior arch of C1, chronic and unchanged IMPRESSION: Negative noncontrast head CT. Electronically Signed   By: Keith Rake M.D.   On: 07/27/2021 18:12   CT Angio Chest/Abd/Pel for Dissection W and/or Wo Contrast  Result Date: 07/27/2021 CLINICAL DATA:  Bilateral leg and arm pain, acute aortic syndrome suspected. EXAM: CT ANGIOGRAPHY CHEST, ABDOMEN AND PELVIS TECHNIQUE: Non-contrast CT of the chest was initially obtained. Multidetector CT imaging through the chest, abdomen and pelvis was performed using the standard protocol during bolus administration of intravenous contrast. Multiplanar reconstructed images and MIPs were obtained and reviewed to evaluate the vascular anatomy. RADIATION DOSE REDUCTION: This exam was performed according to the departmental dose-optimization program which includes automated exposure control, adjustment of the mA and/or kV according  to patient size and/or use of iterative reconstruction technique. CONTRAST:  173mL OMNIPAQUE IOHEXOL 350 MG/ML SOLN COMPARISON:  November 20, 2019. FINDINGS: CTA CHEST FINDINGS Cardiovascular: Noncontrast sequence demonstrates aortic atherosclerosis without intramural hematoma or aneurysmal dilation of the thoracic aorta. No aortic dissection. No central pulmonary embolus on this nondedicated study. Normal size heart. Small area of calcifications along the posterior pericardium similar dating back to multiple priors and likely reflect sequela of prior infection. Mediastinum/Nodes: No supraclavicular adenopathy. Hypodense 19 mm nodule in the thyroid isthmus. No pathologically enlarged mediastinal, hilar or axillary lymph nodes. Small hiatal hernia. Lungs/Pleura: Bibasilar atelectasis versus scarring. No focal airspace consolidation. No suspicious pulmonary nodules or masses. No pleural effusion. No pneumothorax. Musculoskeletal: Degenerative change of the shoulder. Thoracic spondylosis. No aggressive lytic or blastic lesion of bone. Review of the MIP images confirms the above findings. CTA ABDOMEN AND PELVIS FINDINGS VASCULAR Aorta: Aortic atherosclerosis. Normal caliber aorta without aneurysm, dissection, vasculitis or significant stenosis. Celiac: Patent without evidence of aneurysm, dissection, vasculitis or significant stenosis. SMA: Patent without evidence of aneurysm, dissection, vasculitis or significant stenosis. Renals: Both renal arteries are patent without evidence of aneurysm, dissection, vasculitis, fibromuscular dysplasia or significant stenosis. IMA: Patent without evidence of aneurysm, dissection, vasculitis or significant stenosis. Inflow: Patent without evidence of aneurysm, dissection, vasculitis or significant stenosis. Veins: No obvious venous abnormality within the limitations of this arterial phase study. Review of the MIP images confirms the above findings. NON-VASCULAR Hepatobiliary: Similar  postsurgical changes of prior liver transplant with surgical clips along the posterior liver. No suspicious hepatic lesion identified. Biliary ductal dilation is unchanged from prior measuring up to 1.5 cm. Gallbladder surgically absent. Pancreas: No pancreatic ductal dilation or evidence of acute inflammation. Spleen: No splenomegaly or focal splenic lesion. Adrenals/Urinary Tract: Adrenal gland appears normal. No hydronephrosis. Left renal cyst. Subtle areas of right renal hypoenhancement for instance on axial image 121/7. Urinary bladder is unremarkable for degree of distension. Stomach/Bowel: No enteric contrast was administered. Small hiatal hernia. Gastric diverticulum. No pathologic dilation of small or large bowel. The appendix and terminal ileum appear normal. No evidence of acute bowel inflammation. Lymphatic: No pathologically enlarged abdominal or pelvic lymph nodes. Reproductive: Status post hysterectomy. No adnexal masses. Other: No significant abdominopelvic free fluid. Musculoskeletal: Possible early avascular necrosis of the right femoral head. Multilevel degenerative changes spine. No acute osseous abnormality. Review of the MIP images confirms the above findings. IMPRESSION: 1.  No evidence of thoracic or abdominal aortic aneurysm or dissection. 2. Subtle areas of right renal hypoenhancement, which may reflect artifact but can be seen with pyelonephritis. Correlate with urinalysis. 3. Similar postsurgical changes of prior liver transplant. 4. 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 5. Hypodense 19 mm nodule in the thyroid isthmus. Recommend nonemergent thyroid US (ref: J Am Coll Radiol. 2015 Feb;12(2): 143-50). 6.  Aortic Atherosclerosis (ICD10-I70.0). Electronically Signed   By: Dahlia Bailiff M.D.   On: 07/27/2021 18:26    Procedures Procedures    Medications Ordered in ED Medications  fentaNYL (SUBLIMAZE) injection 50 mcg (50 mcg Intravenous Given 07/27/21 1717)   iohexol (OMNIPAQUE) 350 MG/ML injection 100 mL (100 mLs Intravenous Contrast Given 07/27/21 1745)    ED Course/ Medical Decision Making/ A&P                            Patient presents with multiple complaints.  Given she had bilateral weakness in 3/4 extremities, my suspicion for stroke or CNS emergency is fairly low.  CT head was obtained and is negative.  Chest x-ray was obtained and I have reviewed this image and there is no obvious edema/pneumonia.  CT angiography was obtained to rule out dissection given her multiple areas of pain including chest pain and potential neuro symptoms.  This is negative for acute dissection.  There is a questionable finding of potential right renal hypoenhancement.  However she has no urinary symptoms and does not feel like she has a UTI.  She declines urine testing.  I think pyelonephritis is unlikely.  ECG shows no acute ischemia.  Troponins are negative x2.  LFTs are normal with her history of prior liver transplant.  Kidney function shows no acute kidney injury.  At this point, she appears stable for discharge home with return precautions.  She is mildly hypertensive here and will follow-up with her PCP for potential treatment as an outpatient.        Final Clinical Impression(s) / ED Diagnoses Final diagnoses:  Nonspecific chest pain    Rx / DC Orders ED Discharge Orders     None         Sherwood Gambler, MD 07/27/21 2320

## 2021-07-27 NOTE — ED Notes (Signed)
E-signature pad unavailable at time of pt discharge. This RN discussed discharge materials with pt and answered all pt questions. Pt stated understanding of discharge material. ? ?

## 2021-07-27 NOTE — ED Provider Triage Note (Signed)
Emergency Medicine Provider Triage Evaluation Note  Cassandra [name] , a 61 y.o. female  was evaluated in triage.  Pt complains of chest pain and left sided weakness. Patient states that at about 1300, she developed mid chest pain with associated left sided weakness and numbness. She tried to get up, but felt like she was off balance. These symptoms have completely resolved since calling EMS.   Review of Systems  Positive:  Negative:   Physical Exam  BP (!) 134/102 (BP Location: Left Arm)    Pulse (!) 114    Temp 98.7 F (37.1 C) (Oral)    Resp 18    SpO2 96%  Gen:   Awake, no distress   Resp:  Normal effort  MSK:   Moves extremities without difficulty  Other:  Heart RRR. No Murmurs.  5/5 strength in all ext. No pronator drift. Gait without difficulty. PERRLA. EOM intact. Sensation intact in all 4 ext.   Medical Decision Making  Medically screening exam initiated at 2:46 PM.  Appropriate orders placed.  [name] was informed that the remainder of the evaluation will be completed by another provider, this initial triage assessment does not replace that evaluation, and the importance of remaining in the ED until their evaluation is complete.  TIA and chest pain orders placed.    Adolphus Birchwood, [place] 07/27/21 1449

## 2021-07-28 ENCOUNTER — Other Ambulatory Visit: Payer: Self-pay

## 2021-07-28 ENCOUNTER — Ambulatory Visit (INDEPENDENT_AMBULATORY_CARE_PROVIDER_SITE_OTHER): Payer: Medicare Other | Admitting: Family Medicine

## 2021-07-28 ENCOUNTER — Encounter: Payer: Self-pay | Admitting: Family Medicine

## 2021-07-28 ENCOUNTER — Other Ambulatory Visit: Payer: Self-pay | Admitting: Family Medicine

## 2021-07-28 VITALS — BP 118/90 | HR 116 | Temp 97.6°F | Ht 64.0 in | Wt 191.1 lb

## 2021-07-28 DIAGNOSIS — R079 Chest pain, unspecified: Secondary | ICD-10-CM

## 2021-07-28 DIAGNOSIS — M5416 Radiculopathy, lumbar region: Secondary | ICD-10-CM | POA: Diagnosis not present

## 2021-07-28 DIAGNOSIS — R946 Abnormal results of thyroid function studies: Secondary | ICD-10-CM | POA: Diagnosis not present

## 2021-07-28 DIAGNOSIS — M5412 Radiculopathy, cervical region: Secondary | ICD-10-CM | POA: Diagnosis not present

## 2021-07-28 DIAGNOSIS — R000 Tachycardia, unspecified: Secondary | ICD-10-CM

## 2021-07-28 MED ORDER — PREDNISONE 20 MG PO TABS: 40.0000 mg | ORAL_TABLET | Freq: Every day | ORAL | 0 refills | Status: AC

## 2021-07-28 MED ORDER — METOPROLOL TARTRATE 25 MG PO TABS: 12.5000 mg | ORAL_TABLET | Freq: Two times a day (BID) | ORAL | 1 refills | Status: DC

## 2021-07-28 MED ORDER — OXYCODONE-ACETAMINOPHEN 5-325 MG PO TABS: 1.0000 | ORAL_TABLET | Freq: Four times a day (QID) | ORAL | 0 refills | Status: AC | PRN

## 2021-07-28 NOTE — Progress Notes (Signed)
Subjective:     Patient ID: Cassandra [name], female    DOB: 02-15-1961, 61 y.o.   MRN: [id number]  Chief Complaint  Patient presents with   Follow-up    ED Follow-up Still feeling a little tired and having muscle spasms     HPI F/u ED-went to ER yesterday-called 911 as weak, chest pain. No slurred speech Was going to get eyeglasses and sudden onset cp/weakness. H/o liver xplant >30 yrs ago.  CP and weakness starting around noon.  B arms/shoulders and L back.  L leg weak for 15 mins-resolved. Had CT brain/c/a/p.  Labs unremarkable  Still pain, intermitt shoulders/back/chest-lasts few sec. Gets really "hot".  No sob/dizzy.  Making blood pressure go up. Heart rate increases.  Some neck pain.  Still dragging L leg some.  No energy. No f/c. No v/d.   Had URI 1/30 Has h/o spondylosis and has gotten injections in past "but then covid, and haven't followed up"  Blood pressure up some past 2 weeks.  Had labs Monday 2/20 for xplant team-per pt, LFT sl up but not concerned.   Wants pain meds few days-tramadol never works.  Can do percocet.  Has a lot of allergies.   Health Maintenance Due  Topic Date Due   Zoster Vaccines- Shingrix (1 of 2) Never done   COVID-19 Vaccine (6 - Booster) 09/13/2020    Past Medical History:  Diagnosis Date   Anxiety    AS (sickle cell trait) ([place]) 06/29/2019   Autoimmune hepatitis ([place])    Chronic renal insufficiency    Cirrhosis ([place])    COLONIC POLYPS, ADENOMATOUS, HX OF 05/05/2008   Qualifier: Diagnosis of  By: [name], [name]     Depression    External hemorrhoids    Fibromyalgia    GERD (gastroesophageal reflux disease)    Hyperlipidemia    Internal hemorrhoids    Iron deficiency anemia    Liver replaced by transplant ([place]) 02/22/2009   Qualifier: Diagnosis of  By: Chester Holstein NP, Nevin Bloodgood     Migraine    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 350 g 3   fluticasone (FLONASE) 50 MCG/ACT nasal spray SHAKE LIQUID AND USE 2 SPRAYS IN EACH NOSTRIL DAILY 16 g 6   levocetirizine (XYZAL) 5 MG tablet Take 1 tablet (5 mg total) by mouth every evening. 90 tablet 0   mycophenolate (CELLCEPT) 500 MG tablet Take 500 mg by mouth 2 (two) times daily.     olopatadine (PATANOL) 0.1 % ophthalmic solution INSTILL 1 DROP IN BOTH EYES TWICE DAILY 5 mL 3   Omega-3 Fatty Acids (FISH OIL) 1000 MG CAPS Take 1 capsule by mouth daily.     predniSONE (DELTASONE) 1 MG tablet Take 5 mg by mouth daily.     tacrolimus (PROGRAF) 1 MG capsule Take 2 capsules (2 mg total) by mouth 2 (two) times daily.     Evolocumab (REPATHA SURECLICK) 096 MG/ML SOAJ Inject 1 Dose into the skin every 14 (fourteen) days. (Patient  not taking: Reported on 07/06/2021) 2 mL 11   PredniSONE (DELTASONE PO) Take 5 mg by mouth daily.      Rimegepant Sulfate (NURTEC) 75 MG TBDP Take 75 mg by mouth daily as needed. For migraines. Take as close to onset of migraine as possible. One daily maximum. (Patient not taking: Reported on 07/06/2021) 8 tablet 0   No facility-administered medications prior to visit.    Allergies  Allergen Reactions   Erythromycin Diarrhea    REACTION: Nausea/vomiting   Ezetimibe Other (See Comments)    Muscle Cramps Nausea Diarrhea   Other Nausea Only and Other (See Comments)    Knocks her out, or makes her sleepy and does not take them. Muscle Cramps Nausea Diarrhea Uncoded Allergy. Allergen: vioxx   Rofecoxib Other (See Comments)    Pt not sure but knows she can not take it. REACTION: Nausea/vomiting   Statins Other  (See Comments)    myalgias   Zonisamide Other (See Comments)    Fogginess Fogginess    Elavil [Amitriptyline] Other (See Comments)    groggy   Nsaids     REACTION: GI Cramping/nausea   ROS neg/noncontributory except as noted HPI/below      Objective:     BP 118/90    Pulse (!) 116    Temp 97.6 F (36.4 C) (Temporal)    Ht 5\' 4"  (1.626 m)    Wt 191 lb 2 oz (86.7 kg)    SpO2 99%    BMI 32.81 kg/m  Wt Readings from Last 3 Encounters:  07/28/21 191 lb 2 oz (86.7 kg)  06/26/21 202 lb 1.6 oz (91.7 kg)  05/08/21 201 lb 9.6 oz (91.4 kg)        Gen: WDWN NAD OAAF in pain HEENT: NCAT, conjunctiva not injected, sclera nonicteric NECK:  supple, no thyromegaly, no nodes, no carotid bruits CARDIAC: tachyRRR, S1S2+, no murmur. DP 2+B LUNGS: CTAB. No wheezes ABDOMEN:  BS+, soft, NTND, No HSM, no masses EXT:  no edema MSK: no gross abnormalities. MS 5/5 all 4.  Tender to palp trunk front and back.  Shoulders sl tender.  Can stand on 1 leg, heels, toes. SLR neg B NEURO: A&O x3.  CN II-XII intact.  PSYCH: normal mood. Good eye contact  Spent 10 minutes reviewing ER records, lab trends. Then 28min w/pt devising plang. Checking pdmp for meds, etc.   Assessment & Plan:   Problem List Items Addressed This Visit   None Visit Diagnoses     Cervical radiculopathy    -  Primary   Relevant Orders   Sedimentation rate   CK   C-reactive protein   TSH   T4, free   Lumbar radiculopathy       Relevant Orders   Sedimentation rate   CK   C-reactive protein   TSH   T4, free   Chest pain, unspecified type       Tachycardia          Chest pain-cardiac ruled out in ER.  Suspect more musculoskeletal.  Mult allergies to meds.  Will check labs.  Poss inflammation, poss flares of radicular pain.  Will do pred.  Few days percocet.  Worse, new symptoms, to ER Cervical/lumbar radiculopathies-?flaring.  Check labs  needs to f/u Dr. Jonni Sanger if problomatic and needing to go back to pain  mgmt Tachycardia-elevated bp's-monitor closely.  Will do metoprolol 12.5mg  bid-? If just something flaring. Or other.  Will need to f/u w/progress.   Meds ordered  this encounter  Medications   predniSONE (DELTASONE) 20 MG tablet    Sig: Take 2 tablets (40 mg total) by mouth daily with breakfast for 7 days.    Dispense:  14 tablet    Refill:  0   oxyCODONE-acetaminophen (PERCOCET/ROXICET) 5-325 MG tablet    Sig: Take 1 tablet by mouth every 6 (six) hours as needed for up to 5 days for severe pain.    Dispense:  15 tablet    Refill:  0   metoprolol tartrate (LOPRESSOR) 25 MG tablet    Sig: Take 0.5 tablets (12.5 mg total) by mouth 2 (two) times daily.    Dispense:  30 tablet    Refill:  1    Wellington Hampshire, MD

## 2021-07-28 NOTE — Patient Instructions (Signed)
Will do prednisone for possible pinched nerve Percocet  Let Dr. Jonni Sanger know if need to do injections again.  Metoprolol 1/2 tab twice daily-heart racing/blood pressure

## 2021-07-29 LAB — T4, FREE: Free T4: 1.2 ng/dL (ref 0.8–1.8)

## 2021-07-29 LAB — CK: Total CK: 201 U/L — ABNORMAL HIGH (ref 29–143)

## 2021-07-29 LAB — SEDIMENTATION RATE: Sed Rate: 17 mm/h (ref 0–30)

## 2021-07-29 LAB — C-REACTIVE PROTEIN: CRP: 8 mg/L — ABNORMAL HIGH (ref <8.0)

## 2021-07-29 LAB — TSH: TSH: 0.71 mIU/L (ref 0.40–4.50)

## 2021-08-07 ENCOUNTER — Other Ambulatory Visit: Payer: Self-pay

## 2021-08-07 ENCOUNTER — Encounter: Payer: Self-pay | Admitting: Family Medicine

## 2021-08-07 ENCOUNTER — Ambulatory Visit (INDEPENDENT_AMBULATORY_CARE_PROVIDER_SITE_OTHER): Payer: Medicare Other | Admitting: Family Medicine

## 2021-08-07 VITALS — BP 122/80 | HR 86 | Temp 97.6°F | Ht 64.0 in | Wt 192.5 lb

## 2021-08-07 DIAGNOSIS — R000 Tachycardia, unspecified: Secondary | ICD-10-CM | POA: Diagnosis not present

## 2021-08-07 DIAGNOSIS — M4802 Spinal stenosis, cervical region: Secondary | ICD-10-CM

## 2021-08-07 DIAGNOSIS — R030 Elevated blood-pressure reading, without diagnosis of hypertension: Secondary | ICD-10-CM | POA: Diagnosis not present

## 2021-08-07 MED ORDER — CARVEDILOL 3.125 MG PO TABS: 3.1250 mg | ORAL_TABLET | Freq: Two times a day (BID) | ORAL | 3 refills | Status: DC

## 2021-08-07 MED ORDER — OXYCODONE-ACETAMINOPHEN 5-325 MG PO TABS: 1.0000 | ORAL_TABLET | Freq: Four times a day (QID) | ORAL | 0 refills | Status: DC | PRN

## 2021-08-07 NOTE — Patient Instructions (Signed)
It was very nice to see you today! ?Stop metoprolol.  Start carvedilol.  Monitor blood pressure and pulse(heart rate) ? ? ?PLEASE NOTE: ? ?If you had any lab tests please let us know if you have not heard back within a few days. You may see your results on MyChart before we have a chance to review them but we will give you a call once they are reviewed by [place]. If we ordered any referrals today, please let us know if you have not heard from their office within the next week.  ? ?Please try these tips to maintain a healthy lifestyle: ? ?Eat most of your calories during the day when you are active. Eliminate processed foods including packaged sweets (pies, cakes, cookies), reduce intake of potatoes, white bread, white pasta, and white rice. Look for whole grain options, oat flour or almond flour. ? ?Each meal should contain half fruits/vegetables, one quarter protein, and one quarter carbs (no bigger than a computer mouse). ? ?Cut down on sweet beverages. This includes juice, soda, and sweet tea. Also watch fruit intake, though this is a healthier sweet option, it still contains natural sugar! Limit to 3 servings daily. ? ?Drink at least 1 glass of water with each meal and aim for at least 8 glasses per day ? ?Exercise at least 150 minutes every week.   ?

## 2021-08-07 NOTE — Progress Notes (Signed)
? ?Subjective:  ? ? ? Patient ID: Cassandra [name], female    DOB: 06-27-1960, 61 y.o.   MRN: [id number] ? ?Chief Complaint  ?Patient presents with  ? Hypertension  ?  Has been up and down ?  ? Medication Reaction  ?  Stopped taking prednisone due to it making her feel jittery, anxious and causing issues with her eye  ? Pain  ?  Recurrent pain in shoulder and back, having trouble lifting  ? ? ?HPI ? HTN/tachycardia-bp's were elevated as well as HR so started metoprolol 12.'5mg'$  bid. Home 117-150/80-95.  Feeling weak/tired.    Skipped dose and felt better. ?Pain-stopped pred as jittery/anxious, and "eye" issues. When in pain, gets hot flash.  Pt requesting 7 more days of percocet till chiro appt.  Still taking suppressive dose of pred. ? ?Health Maintenance Due  ?Topic Date Due  ? Zoster Vaccines- Shingrix (1 of 2) Never done  ? ? ?Past Medical History:  ?Diagnosis Date  ? Anxiety   ? AS (sickle cell trait) ([place]) 06/29/2019  ? Autoimmune hepatitis ([place])   ? Chronic renal insufficiency   ? Cirrhosis ([place])   ? COLONIC POLYPS, ADENOMATOUS, HX OF 05/05/2008  ? Qualifier: Diagnosis of  By: [name], [name]    ? Depression   ? External hemorrhoids   ? Fibromyalgia   ? GERD (gastroesophageal reflux disease)   ? Hyperlipidemia   ? Internal hemorrhoids   ? Iron deficiency anemia   ? Liver replaced by transplant ([place]) 02/22/2009  ? Qualifier: Diagnosis of  By: Chester Holstein NP, Nevin Bloodgood    ? Migraine   ? 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 Ruptured disc, cervical   ? multiple levels  ? Statin intolerance 05/13/2018  ? ? ?Past Surgical History:  ?Procedure Laterality Date  ? ABDOMINAL HYSTERECTOMY  2010  ? KNEE ARTHROSCOPY Right 2010  ? LIVER TRANSPLANT  1992  ? ROTATOR CUFF REPAIR Left 2010  ? x2  ? ? ?Outpatient Medications Prior to Visit  ?Medication Sig Dispense Refill  ? diclofenac Sodium (VOLTAREN) 1 % GEL Apply 4 g topically 4 (four) times daily as needed. 350 g 3  ? fluticasone (FLONASE) 50 MCG/ACT nasal spray  SHAKE LIQUID AND USE 2 SPRAYS IN EACH NOSTRIL DAILY 16 g 6  ? levocetirizine (XYZAL) 5 MG tablet Take 1 tablet (5 mg total) by mouth every evening. 90 tablet 0  ? mycophenolate (CELLCEPT) 500 MG tablet Take 500 mg by mouth 2 (two) times daily.    ? olopatadine (PATANOL) 0.1 % ophthalmic solution INSTILL 1 DROP IN BOTH EYES TWICE DAILY 5 mL 3  ? Omega-3 Fatty Acids (FISH OIL) 1000 MG CAPS Take 1 capsule by mouth daily.    ? tacrolimus (PROGRAF) 1 MG capsule Take 2 capsules (2 mg total) by mouth 2 (two) times daily.    ? metoprolol tartrate (LOPRESSOR) 25 MG tablet Take 0.5 tablets (12.5 mg total) by mouth 2 (two) times daily. 30 tablet 1  ? predniSONE (DELTASONE) 1 MG tablet Take 5 mg by mouth daily. (Patient not taking: Reported on 08/07/2021)    ? ?No facility-administered medications prior to visit.  ? ? ?Allergies  ?Allergen Reactions  ? Erythromycin Diarrhea  ?  REACTION: Nausea/vomiting  ? Ezetimibe Other (See Comments)  ?  Muscle Cramps ?Nausea ?Diarrhea  ? Other Nausea Only and Other (See Comments)  ?  Knocks her out, or makes her sleepy and does not take them. ?Muscle Cramps ?Nausea ?Diarrhea ?  Uncoded Allergy. Allergen: vioxx  ? Rofecoxib Other (See Comments)  ?  Pt not sure but knows she can not take it. ?REACTION: Nausea/vomiting  ? Statins Other (See Comments)  ?  myalgias  ? Zonisamide Other (See Comments)  ?  Fogginess ?Fogginess ?  ? Elavil [Amitriptyline] Other (See Comments)  ?  groggy  ? Nsaids   ?  REACTION: GI Cramping/nausea  ? ?ROS neg/noncontributory except as noted HPI/below ?No SI ? ? ?   ?Objective:  ?  ? ?BP 122/80   Pulse 86   Temp 97.6 ?F (36.4 ?C) (Temporal)   Ht '5\' 4"'$  (1.626 m)   Wt 192 lb 8 oz (87.3 kg)   SpO2 100%   BMI 33.04 kg/m?  ?Wt Readings from Last 3 Encounters:  ?08/07/21 192 lb 8 oz (87.3 kg)  ?07/28/21 191 lb 2 oz (86.7 kg)  ?06/26/21 202 lb 1.6 oz (91.7 kg)  ? ? ?   ? ?Gen: WDWN NAD OAAF   130/88 ?HEENT: NCAT, conjunctiva not injected, sclera nonicteric ?CARDIAC: RRR,  S1S2+, no murmur.  ?MSK: no gross abnormalities. Some muscle tightness neck ?NEURO: A&O x3.  CN II-XII intact.  ?PSYCH: normal mood. Good eye contact ? ?Assessment & Plan:  ? ?Problem List Items Addressed This Visit   ? ?  ? Other  ? Cervical stenosis of spine  ? ?Other Visit Diagnoses   ? ? Elevated blood pressure reading    -  Primary  ? Tachycardia      ? ?  ? Elevated blood pressures and tachycardia-too drowsy on metoprolol.  ?elevation d/t pain?   Will change to low doses of coreg.  Monitor   sees Dr. Jonni Sanger 3/27 ?Cervical stenosis pain-pt awaiting for chiro.  Req percocet 7 more days.  Doesn't want long term and I agree. ? ?No orders of the defined types were placed in this encounter. ? ? ?Wellington Hampshire, MD ? ?

## 2021-08-15 ENCOUNTER — Ambulatory Visit (HOSPITAL_BASED_OUTPATIENT_CLINIC_OR_DEPARTMENT_OTHER): Payer: Medicare Other | Admitting: Internal Medicine

## 2021-08-16 DIAGNOSIS — M9901 Segmental and somatic dysfunction of cervical region: Secondary | ICD-10-CM | POA: Diagnosis not present

## 2021-08-16 DIAGNOSIS — M9902 Segmental and somatic dysfunction of thoracic region: Secondary | ICD-10-CM | POA: Diagnosis not present

## 2021-08-16 DIAGNOSIS — M542 Cervicalgia: Secondary | ICD-10-CM | POA: Diagnosis not present

## 2021-08-16 DIAGNOSIS — M9903 Segmental and somatic dysfunction of lumbar region: Secondary | ICD-10-CM | POA: Diagnosis not present

## 2021-08-18 DIAGNOSIS — M9901 Segmental and somatic dysfunction of cervical region: Secondary | ICD-10-CM | POA: Diagnosis not present

## 2021-08-18 DIAGNOSIS — M9903 Segmental and somatic dysfunction of lumbar region: Secondary | ICD-10-CM | POA: Diagnosis not present

## 2021-08-18 DIAGNOSIS — M9902 Segmental and somatic dysfunction of thoracic region: Secondary | ICD-10-CM | POA: Diagnosis not present

## 2021-08-21 DIAGNOSIS — M9903 Segmental and somatic dysfunction of lumbar region: Secondary | ICD-10-CM | POA: Diagnosis not present

## 2021-08-21 DIAGNOSIS — M9902 Segmental and somatic dysfunction of thoracic region: Secondary | ICD-10-CM | POA: Diagnosis not present

## 2021-08-21 DIAGNOSIS — M9901 Segmental and somatic dysfunction of cervical region: Secondary | ICD-10-CM | POA: Diagnosis not present

## 2021-08-23 ENCOUNTER — Encounter (HOSPITAL_COMMUNITY): Payer: Self-pay | Admitting: Radiology

## 2021-08-23 DIAGNOSIS — M9902 Segmental and somatic dysfunction of thoracic region: Secondary | ICD-10-CM | POA: Diagnosis not present

## 2021-08-23 DIAGNOSIS — M9901 Segmental and somatic dysfunction of cervical region: Secondary | ICD-10-CM | POA: Diagnosis not present

## 2021-08-23 DIAGNOSIS — M9903 Segmental and somatic dysfunction of lumbar region: Secondary | ICD-10-CM | POA: Diagnosis not present

## 2021-08-25 DIAGNOSIS — M9903 Segmental and somatic dysfunction of lumbar region: Secondary | ICD-10-CM | POA: Diagnosis not present

## 2021-08-25 DIAGNOSIS — M9902 Segmental and somatic dysfunction of thoracic region: Secondary | ICD-10-CM | POA: Diagnosis not present

## 2021-08-25 DIAGNOSIS — M9901 Segmental and somatic dysfunction of cervical region: Secondary | ICD-10-CM | POA: Diagnosis not present

## 2021-08-28 ENCOUNTER — Encounter: Payer: Self-pay | Admitting: Family Medicine

## 2021-08-28 ENCOUNTER — Ambulatory Visit (INDEPENDENT_AMBULATORY_CARE_PROVIDER_SITE_OTHER): Payer: Medicare Other | Admitting: Family Medicine

## 2021-08-28 VITALS — BP 131/86 | HR 85 | Temp 98.0°F | Ht 64.0 in | Wt 190.8 lb

## 2021-08-28 DIAGNOSIS — G4701 Insomnia due to medical condition: Secondary | ICD-10-CM | POA: Diagnosis not present

## 2021-08-28 DIAGNOSIS — M797 Fibromyalgia: Secondary | ICD-10-CM

## 2021-08-28 DIAGNOSIS — E119 Type 2 diabetes mellitus without complications: Secondary | ICD-10-CM

## 2021-08-28 DIAGNOSIS — E782 Mixed hyperlipidemia: Secondary | ICD-10-CM | POA: Diagnosis not present

## 2021-08-28 DIAGNOSIS — M545 Low back pain, unspecified: Secondary | ICD-10-CM | POA: Diagnosis not present

## 2021-08-28 DIAGNOSIS — D573 Sickle-cell trait: Secondary | ICD-10-CM

## 2021-08-28 DIAGNOSIS — G8929 Other chronic pain: Secondary | ICD-10-CM

## 2021-08-28 DIAGNOSIS — D849 Immunodeficiency, unspecified: Secondary | ICD-10-CM | POA: Diagnosis not present

## 2021-08-28 LAB — MICROALBUMIN / CREATININE URINE RATIO
Creatinine,U: 203.3 mg/dL
Microalb Creat Ratio: 0.5 mg/g (ref 0.0–30.0)
Microalb, Ur: 0.9 mg/dL (ref 0.0–1.9)

## 2021-08-28 LAB — POCT GLYCOSYLATED HEMOGLOBIN (HGB A1C): Hemoglobin A1C: 6.1 % — AB (ref 4.0–5.6)

## 2021-08-28 MED ORDER — TRAZODONE HCL 50 MG PO TABS: 25.0000 mg | ORAL_TABLET | Freq: Every evening | ORAL | 3 refills | Status: DC | PRN

## 2021-08-28 NOTE — Patient Instructions (Addendum)
Please return in 3 months to recheck sleep, mood and pain and sugars.  ? ?Try the trazodone at night as directed for sleep. Work on Child psychotherapist. This will help your pain due to fibromyalgia.  ?Stop the carvedilol and monitor your blood pressures at home; please bring in the log to your next appointment.  ? ?Follow up with the lipid clinic as scheduled for your cholesterol.  ? ?If you have any questions or concerns, please don't hesitate to send me a message via MyChart or call the office at [phone]. Thank you for visiting with [place] today! It's our pleasure caring for you.  ?

## 2021-08-28 NOTE — Progress Notes (Addendum)
? ?Subjective  ?CC:  ?Chief Complaint  ?Patient presents with  ? Hypertension  ?  F/U A1c. Pt stated that's he could barley walk on 08/26/2021 from Lt leg pain. She thinks it was from the Carvedilol that she started.   ?Left ? ?HPI: Cassandra [name] is a 61 y.o. female who presents to the office today for follow up of diabetes and problems listed above in the chief complaint.  ?I reviewed multiple records from her recent ER visit, and 2 follow-up visits here in the office.  Patient with known history of chronic back pain, fibromyalgia, history of liver transplant and chronically immunosuppressed, bilateral knee arthritis, diabetes presents for follow-up.  She had atypical leg weakness and pain.  Blood pressure was noted to be elevated as well.  She was started on antihypertensives.  Complains of left leg pain.  No chest pain.  No shortness of breath. ?She admits to high stress over last several months due to litigation in progress. Having difficulty sleeping now as well. Fortunately, things are improving with stress levels and she went to a chiropractor who helped get her back pain back under control.  ?No h/o HTN. ? ?Wt Readings from Last 3 Encounters:  ?08/28/21 190 lb 12.8 oz (86.5 kg)  ?08/07/21 192 lb 8 oz (87.3 kg)  ?07/28/21 191 lb 2 oz (86.7 kg)  ? ? ?BP Readings from Last 3 Encounters:  ?08/28/21 131/86  ?08/07/21 122/80  ?07/28/21 118/90  ? ? ?Assessment  ?1. Controlled type 2 diabetes mellitus without complication, without long-term current use of insulin ([place])   ?2. Immunosuppressed status ([place]) Chronic  ?3. AS (sickle cell trait) ([facility]) Chronic  ?4. Mixed hyperlipidemia   ?5. Insomnia due to medical condition   ?6. Chronic midline low back pain without sciatica   ?7. Fibromyalgia   ? ?  ?Plan  ?Diabetes is currently very well controlled. Diet controlled. Check urine ac ratio. Can start ace if positive.  ?Elevated bp w/o HTN: suspect stress response. Stop carvedilol and monitor.  ?Fibromyalgia flare due  to stress; counseling done. Insomnia: start trazodone. Want to avoid narcotics.  ?HLD: needs to f/u with lipid clinic to get repatha.  ? ?I spent a total of 46 minutes for this patient encounter. Time spent included preparation, chart review, face-to-face counseling, with the patient and coordination of care, review of chart and records, and documentation of the encounter.medication management  ? ?Follow up: 3 mo to recheck pain, sleep, bp and diabetes. ? ?Orders Placed This Encounter  ?Procedures  ? Microalbumin / creatinine urine ratio  ? POCT glycosylated hemoglobin (Hb A1C)  ? ?Meds ordered this encounter  ?Medications  ? traZODone (DESYREL) 50 MG tablet  ?  Sig: Take 0.5-1 tablets (25-50 mg total) by mouth at bedtime as needed for sleep.  ?  Dispense:  30 tablet  ?  Refill:  3  ? ?  ? ?Immunization History  ?Administered Date(s) Administered  ? Hepatitis A, Adult 09/27/2016, 09/26/2017  ? Influenza Split 03/05/2012  ? Influenza, Quadrivalent, Recombinant, Inj, Pf 04/29/2013  ? Influenza, Seasonal, Injecte, Preservative Fre 03/02/2014, 02/28/2015  ? Influenza,inj,Quad PF,6+ Mos 03/28/2016  ? Influenza-Unspecified 05/26/2013, 03/28/2016  ? PFIZER Comirnaty(Gray Top)Covid-19 Tri-Sucrose Vaccine 08/27/2019, 09/17/2019, 07/19/2020  ? PFIZER(Purple Top)SARS-COV-2 Vaccination 08/27/2019, 09/17/2019  ? Pneumococcal Conjugate-13 09/22/2015  ? Pneumococcal Polysaccharide-23 09/27/2016  ? Tdap 05/17/2008, 12/03/2010  ? Tetanus 05/04/2020  ? ? ?Diabetes Related Lab Review: ?Lab Results  ?Component Value Date  ? HGBA1C 6.1 (A) 08/28/2021  ?  HGBA1C 6.5 02/28/2021  ? HGBA1C 5.9 (A) 08/24/2020  ?  ?No results found for: Derl Barrow ?Lab Results  ?Component Value Date  ? CREATININE 1.29 (H) 07/27/2021  ? BUN 24 (H) 07/27/2021  ? NA 131 (L) 07/27/2021  ? K 4.1 07/27/2021  ? CL 101 07/27/2021  ? CO2 18 (L) 07/27/2021  ? ?Lab Results  ?Component Value Date  ? CHOL 275 (H) 02/28/2021  ? CHOL 273 (H) 02/22/2020  ? CHOL 312  (H) 01/21/2019  ? ?Lab Results  ?Component Value Date  ? HDL 58.40 02/28/2021  ? HDL 46 (L) 02/22/2020  ? HDL 52.00 01/21/2019  ? ?Lab Results  ?Component Value Date  ? LDLCALC 187 (H) 02/28/2021  ? LDLCALC 189 (H) 02/22/2020  ? [place] 78 12/13/2011  ? ?Lab Results  ?Component Value Date  ? TRIG 148.0 02/28/2021  ? TRIG 203 (H) 02/22/2020  ? TRIG 289.0 (H) 01/21/2019  ? ?Lab Results  ?Component Value Date  ? CHOLHDL 5 02/28/2021  ? CHOLHDL 5.9 (H) 02/22/2020  ? CHOLHDL 6 01/21/2019  ? ?Lab Results  ?Component Value Date  ? LDLDIRECT 212.0 01/21/2019  ? LDLDIRECT 114.1 10/28/2009  ? ?The ASCVD Risk score (Arnett DK, et al., 2019) failed to calculate for the following reasons: ?  Unable to determine if patient is Non-Hispanic African American ?I have reviewed the [place], Fam and Soc history. ?Patient Active Problem List  ? Diagnosis Date Noted  ? Chronic kidney disease (CKD) stage G3a/A1, moderately decreased glomerular filtration rate (GFR) between 45-59 mL/min/1.73 square meter and albuminuria creatinine ratio less than 30 mg/g ([facility]) 03/01/2021  ?  Priority: High  ? Statin intolerance 05/13/2018  ?  Priority: High  ? Primary insomnia 11/04/2017  ?  Priority: High  ? Immunosuppressed status ([place]) 10/07/2017  ?  Priority: High  ? Adenomatous polyp of colon 09/28/2017  ?  Priority: High  ?  Per [facility] found in 2009; followed up in 2011. ?  ? Mixed hyperlipidemia 03/10/2013  ?  Priority: High  ? Primary osteoarthritis of knees, bilateral 03/01/2013  ?  Priority: High  ?  Dr. Michaelle Birks saw in 08/2016 for pain and was considering Synvisc.  High risk for total knee due to immune suppressant post liver transplant. ?[facility] 2019: severe OA right knee with recurrent effusions. S/p synvisc w/o relief. Holding off on replacement.  ?  ? Chronic back pain 10/13/2012  ?  Priority: High  ? History of liver transplant ([place]) 02/22/2009  ?  Priority: High  ?  [facility]:due to AIH with cirrhosis.  1992. ? ?  ? Migraine with aura  and without status migrainosus, not intractable 07/03/2018  ?  Priority: Medium   ? Cervical stenosis of spine 01/25/2017  ?  Priority: Medium   ? Fibromyalgia 08/24/2013  ?  Priority: Medium   ? Hx of tuberculosis 10/13/2012  ?  Priority: Medium   ? Anxiety disorder 09/16/2007  ?  Priority: Medium   ?  Has failed prozac, elavil, zoloft in past.  ?Used nightly xanax since 2000; weaned 2019.  ?  ? AS (sickle cell trait) ([place]) 06/29/2019  ?  Priority: Low  ? Controlled type 2 diabetes mellitus without complication, without long-term current use of insulin ([place]) 08/28/2021  ? Myopathy, unspecified 02/28/2021  ?  Intolerant to statins and zetia ?  ? Heart murmur 02/28/2021  ? Cervical facet joint syndrome 03/28/2018  ? Spondylosis of cervical region without myelopathy or radiculopathy 03/28/2018  ?  Lumbar facet arthropathy 12/27/2017  ? ? ?Social History: ?Patient  reports that she has never smoked. She has never used smokeless tobacco. She reports that she does not drink alcohol and does not use drugs. ? ?Review of Systems: ?Ophthalmic: negative for eye pain, loss of vision or double vision ?Cardiovascular: negative for chest pain ?Respiratory: negative for SOB or persistent cough ?Gastrointestinal: negative for abdominal pain ?Genitourinary: negative for dysuria or gross hematuria ?MSK: negative for foot lesions ?Neurologic: negative for weakness or gait disturbance ? ?Objective  ?Vitals: BP 131/86   Pulse 85   Temp 98 ?F (36.7 ?C)   Ht '5\' 4"'$  (1.626 m)   Wt 190 lb 12.8 oz (86.5 kg)   SpO2 100%   BMI 32.75 kg/m?  ?General: well appearing, no acute distress  ?Psych:  Alert and oriented, normal mood and affect ?HEENT:  Normocephalic, atraumatic, moist mucous membranes, supple neck  ?Cardiovascular:  Nl S1 and S2, RRR without murmur, gallop or rub. no edema ?Respiratory:  Good breath sounds bilaterally, CTAB with normal effort, no rales ?Ext: tender over bilateral legs muscles: L>R, arms and nl DTRs bilateral lower  ext. ?Nl 5/5 strength BLE ? ? ? ? ?Diabetic education: ongoing education regarding chronic disease management for diabetes was given today. We continue to reinforce the ABC's of diabetic management: A1c (<7

## 2021-08-28 NOTE — Addendum Note (Signed)
Addended by: Billey Chang on: 08/28/2021 03:03 PM ? ? Modules accepted: Level of Service ? ?

## 2021-09-07 DIAGNOSIS — D84821 Immunodeficiency due to drugs: Secondary | ICD-10-CM | POA: Diagnosis not present

## 2021-09-07 DIAGNOSIS — M79606 Pain in leg, unspecified: Secondary | ICD-10-CM | POA: Diagnosis not present

## 2021-09-07 DIAGNOSIS — G479 Sleep disorder, unspecified: Secondary | ICD-10-CM | POA: Diagnosis not present

## 2021-09-07 DIAGNOSIS — M25519 Pain in unspecified shoulder: Secondary | ICD-10-CM | POA: Diagnosis not present

## 2021-09-07 DIAGNOSIS — D849 Immunodeficiency, unspecified: Secondary | ICD-10-CM | POA: Diagnosis not present

## 2021-09-07 DIAGNOSIS — Z5181 Encounter for therapeutic drug level monitoring: Secondary | ICD-10-CM | POA: Diagnosis not present

## 2021-09-07 DIAGNOSIS — Z944 Liver transplant status: Secondary | ICD-10-CM | POA: Diagnosis not present

## 2021-09-07 DIAGNOSIS — K648 Other hemorrhoids: Secondary | ICD-10-CM | POA: Diagnosis not present

## 2021-09-07 DIAGNOSIS — K7460 Unspecified cirrhosis of liver: Secondary | ICD-10-CM | POA: Diagnosis not present

## 2021-09-07 DIAGNOSIS — Z4823 Encounter for aftercare following liver transplant: Secondary | ICD-10-CM | POA: Diagnosis not present

## 2021-09-07 DIAGNOSIS — Z23 Encounter for immunization: Secondary | ICD-10-CM | POA: Diagnosis not present

## 2021-09-07 DIAGNOSIS — M797 Fibromyalgia: Secondary | ICD-10-CM | POA: Diagnosis not present

## 2021-09-25 ENCOUNTER — Ambulatory Visit (INDEPENDENT_AMBULATORY_CARE_PROVIDER_SITE_OTHER): Payer: Medicare Other | Admitting: Family Medicine

## 2021-09-25 ENCOUNTER — Encounter: Payer: Self-pay | Admitting: Family Medicine

## 2021-09-25 VITALS — BP 137/88 | HR 91 | Temp 98.1°F | Ht 64.0 in | Wt 189.6 lb

## 2021-09-25 DIAGNOSIS — E119 Type 2 diabetes mellitus without complications: Secondary | ICD-10-CM

## 2021-09-25 DIAGNOSIS — M79605 Pain in left leg: Secondary | ICD-10-CM

## 2021-09-25 DIAGNOSIS — N1831 Chronic kidney disease, stage 3a: Secondary | ICD-10-CM

## 2021-09-25 MED ORDER — OXYCODONE-ACETAMINOPHEN 5-325 MG PO TABS: 1.0000 | ORAL_TABLET | Freq: Three times a day (TID) | ORAL | 0 refills | Status: DC | PRN

## 2021-09-25 MED ORDER — METHYLPREDNISOLONE ACETATE 80 MG/ML IJ SUSP
80.0000 mg | Freq: Once | INTRAMUSCULAR | Status: AC
  Administered 2021-09-25: 80 mg via INTRAMUSCULAR

## 2021-09-25 NOTE — Progress Notes (Signed)
? ?  Cassandra [name] is a 61 y.o. female who presents today for an office visit. ? ?Assessment/Plan:  ?New/Acute Problems: ?Left Leg Pain ?Consistent with previous sciatica flares.  She does have a known history of lumbar spondylosis and facet arthropathy.  Reassuring neuro exam today.  We discussed repeating imaging however she deferred for now.  We will give 80 mg of Depo-Medrol IM today - her most recent A1c was well controlled.  We need to avoid NSAIDs due to history of allergy and CKD.  We discussed trial of gabapentin or Lyrica however she deferred stating that she has had bad reactions to both these in the past.  We discussed referral to orthopedics or sports medicine however she declined.  She was most recently on Percocet about 6 weeks ago.  She is requesting a refill today. She has not found tramadol to be effective in the past. She usually does well with percocet without any significant side effects.  We will refill today.  Patient was instructed to follow-up with PCP for ongoing management.  Discussed reasons to return to care or seek emergent care. ? ?Chronic Problems Addressed Today: ?CKD -most recent GFR 47.  We will avoid NSAIDs ?T2DM -last A1c 6.1. Should not have any issues with Depo-Medrol. ? ? ?  ?Subjective:  ?HPI: ? ?Patient here with left leg pain. Feels like previous sciatica flares. Located in left buttocks and radiates into lower leg. Started a couple of months ago. Tried using voltaren. Tried going to the chiropractor which did not help. Some numbness and tingling. Much worse with laying down. No weakness.  No reported bowel or bladder incontinence. ? ?   ?  ?Objective:  ?Physical Exam: ?BP 137/88   Pulse 91   Temp 98.1 ?F (36.7 ?C) (Temporal)   Ht '5\' 4"'$  (1.626 m)   Wt 189 lb 9.6 oz (86 kg)   SpO2 99%   BMI 32.54 kg/m?   ?Gen: No acute distress, resting comfortably ?CV: Regular rate and rhythm with no murmurs appreciated ?Pulm: Normal work of breathing, clear to auscultation  bilaterally with no crackles, wheezes, or rhonchi ?MSK: ?- Back: No deformities.  Nontender to palpation.  No midline tenderness ?- Left lower extremity: No deformities.  Sensation light touch intact throughout.  Strength out of 5 throughout and equal to the right.  Reflexes symmetric bilaterally.  Neurovascular intact distally. ?Neuro: Grossly normal, moves all extremities ?Psych: Normal affect and thought content ? ?   ? ?Algis Greenhouse. Jerline Pain, MD ?09/25/2021 11:59 AM  ?

## 2021-09-25 NOTE — Patient Instructions (Addendum)
It was very nice to see you today! ? ?We will give you an injection of an anti-inflammatory today that should help with your pain.  I will give a small refill on the Percocet.  Please follow-up with your primary care doctor if not improving. ? ?Take care, ?Dr Jerline Pain ? ?PLEASE NOTE: ? ?If you had any lab tests please let us know if you have not heard back within a few days. You may see your results on mychart before we have a chance to review them but we will give you a call once they are reviewed by [place]. If we ordered any referrals today, please let us know if you have not heard from their office within the next week.  ? ?Please try these tips to maintain a healthy lifestyle: ? ?Eat at least 3 REAL meals and 1-2 snacks per day.  Aim for no more than 5 hours between eating.  If you eat breakfast, please do so within one hour of getting up.  ? ?Each meal should contain half fruits/vegetables, one quarter protein, and one quarter carbs (no bigger than a computer mouse) ? ?Cut down on sweet beverages. This includes juice, soda, and sweet tea.  ? ?Drink at least 1 glass of water with each meal and aim for at least 8 glasses per day ? ?Exercise at least 150 minutes every week.   ?

## 2021-09-25 NOTE — Addendum Note (Signed)
Addended by: Betti Cruz on: 09/25/2021 12:07 PM ? ? Modules accepted: Orders ? ?

## 2021-10-03 ENCOUNTER — Telehealth: Payer: Self-pay

## 2021-10-03 NOTE — Telephone Encounter (Signed)
Patient is scheduled for 5/16 at 9:30am.  Patient wanted to follow up on pain and walking.  Also states pain in arm is getting bad and wants to follow up on b12.  Could patient be scheduled in a 15 min slot?

## 2021-10-04 NOTE — Telephone Encounter (Signed)
I have added appointment to wait list.

## 2021-10-05 ENCOUNTER — Ambulatory Visit (INDEPENDENT_AMBULATORY_CARE_PROVIDER_SITE_OTHER): Payer: Medicare Other | Admitting: Family Medicine

## 2021-10-05 ENCOUNTER — Other Ambulatory Visit: Payer: Medicare Other

## 2021-10-05 VITALS — BP 140/82 | HR 95 | Temp 98.2°F | Ht 64.0 in | Wt 191.0 lb

## 2021-10-05 DIAGNOSIS — M797 Fibromyalgia: Secondary | ICD-10-CM | POA: Diagnosis not present

## 2021-10-05 DIAGNOSIS — G4701 Insomnia due to medical condition: Secondary | ICD-10-CM | POA: Diagnosis not present

## 2021-10-05 DIAGNOSIS — D849 Immunodeficiency, unspecified: Secondary | ICD-10-CM

## 2021-10-05 DIAGNOSIS — R252 Cramp and spasm: Secondary | ICD-10-CM | POA: Diagnosis not present

## 2021-10-05 DIAGNOSIS — Z944 Liver transplant status: Secondary | ICD-10-CM

## 2021-10-05 LAB — VITAMIN B12: Vitamin B-12: 1060 pg/mL — ABNORMAL HIGH (ref 211–911)

## 2021-10-05 LAB — CK: Total CK: 61 U/L (ref 7–177)

## 2021-10-05 LAB — VITAMIN D 25 HYDROXY (VIT D DEFICIENCY, FRACTURES): VITD: 23.17 ng/mL — ABNORMAL LOW (ref 30.00–100.00)

## 2021-10-05 MED ORDER — TRAZODONE HCL 50 MG PO TABS: 100.0000 mg | ORAL_TABLET | Freq: Every day | ORAL | 5 refills | Status: DC

## 2021-10-05 MED ORDER — CYCLOBENZAPRINE HCL 10 MG PO TABS: 10.0000 mg | ORAL_TABLET | Freq: Three times a day (TID) | ORAL | 0 refills | Status: DC | PRN

## 2021-10-05 NOTE — Progress Notes (Signed)
? ? ?Subjective  ?CC:  ?Chief Complaint  ?Patient presents with  ? Leg Pain  ?  Pt stated that she is still have Lt leg pain and nothing seems to help  ? ? ?HPI: Cassandra [name] is a 61 y.o. female who presents to the office today to address the problems listed above in the chief complaint. ?I've reviewed the multiple notes from recent visits regarding pain: has cervical and lumbar disease and fibromyalgia and bilateral knee osteoarthritis. And fibromyalgia. Currently, no localized pain: more generalized bilateral legs and arms. Has been treated by percocet since February... reports taking nightly. Has failed cymbalta and neurontin in the past. Reports her mood is good. Poor sleep. Started trazadone a few weeks ago but only took it a few times. No fevers, chills or radicular sxs now.  ?  ?Assessment  ?1. Fibromyalgia   ?2. Immunosuppressed status ([place])   ?3. Insomnia due to medical condition   ?4. History of liver transplant ([place])   ?5. Leg cramps   ? ?  ?Plan  ?fibromyalgia:  current sxs most consistent with fibro flare combined with chronic back and knee pain. Will try to improve sleep with increasing trazadone to '100mg'$  nightly. Trial of mm relaxer. Decrease stress. Try to avoid further narcotics; would need a pain clinic if further narcotic treatment needed. Pt does not want that . ?Monitor liver tests. ?Check ck given mm cramps ?Immunosuppressed but no sign of infection as cause.  ? ?Follow up: as scheduled  ?11/28/2021 ? ?Orders Placed This Encounter  ?Procedures  ? CK  ? Vitamin B12  ? VITAMIN D 25 Hydroxy (Vit-D Deficiency, Fractures)  ? ?Meds ordered this encounter  ?Medications  ? traZODone (DESYREL) 50 MG tablet  ?  Sig: Take 2-3 tablets (100-150 mg total) by mouth at bedtime.  ?  Dispense:  90 tablet  ?  Refill:  5  ? cyclobenzaprine (FLEXERIL) 10 MG tablet  ?  Sig: Take 1 tablet (10 mg total) by mouth 3 (three) times daily as needed for muscle spasms (and pain).  ?  Dispense:  90 tablet  ?  Refill:  0   ? ?  ? ?I reviewed the patients updated PMH, FH, and SocHx.  ?  ?Patient Active Problem List  ? Diagnosis Date Noted  ? Controlled type 2 diabetes mellitus without complication, without long-term current use of insulin ([place]) 08/28/2021  ?  Priority: High  ? Chronic kidney disease (CKD) stage G3a/A1, moderately decreased glomerular filtration rate (GFR) between 45-59 mL/min/1.73 square meter and albuminuria creatinine ratio less than 30 mg/g ([facility]) 03/01/2021  ?  Priority: High  ? Statin intolerance 05/13/2018  ?  Priority: High  ? Primary insomnia 11/04/2017  ?  Priority: High  ? Immunosuppressed status ([place]) 10/07/2017  ?  Priority: High  ? Adenomatous polyp of colon 09/28/2017  ?  Priority: High  ? Mixed hyperlipidemia 03/10/2013  ?  Priority: High  ? Primary osteoarthritis of knees, bilateral 03/01/2013  ?  Priority: High  ? Chronic back pain 10/13/2012  ?  Priority: High  ? History of liver transplant ([place]) 02/22/2009  ?  Priority: High  ? Migraine with aura and without status migrainosus, not intractable 07/03/2018  ?  Priority: Medium   ? Cervical stenosis of spine 01/25/2017  ?  Priority: Medium   ? Fibromyalgia 08/24/2013  ?  Priority: Medium   ? Hx of tuberculosis 10/13/2012  ?  Priority: Medium   ? Anxiety disorder 09/16/2007  ?  Priority: Medium   ? AS (sickle cell trait) ([place]) 06/29/2019  ?  Priority: Low  ? Myopathy, unspecified 02/28/2021  ? Cervical facet joint syndrome 03/28/2018  ? Spondylosis of cervical region without myelopathy or radiculopathy 03/28/2018  ? Lumbar facet arthropathy 12/27/2017  ? ?Current Meds  ?Medication Sig  ? cyclobenzaprine (FLEXERIL) 10 MG tablet Take 1 tablet (10 mg total) by mouth 3 (three) times daily as needed for muscle spasms (and pain).  ? diclofenac Sodium (VOLTAREN) 1 % GEL Apply 4 g topically 4 (four) times daily as needed.  ? fluticasone (FLONASE) 50 MCG/ACT nasal spray SHAKE LIQUID AND USE 2 SPRAYS IN EACH NOSTRIL DAILY  ? levocetirizine (XYZAL) 5 MG tablet Take  1 tablet (5 mg total) by mouth every evening.  ? mycophenolate (CELLCEPT) 500 MG tablet Take 500 mg by mouth 2 (two) times daily.  ? olopatadine (PATANOL) 0.1 % ophthalmic solution INSTILL 1 DROP IN BOTH EYES TWICE DAILY  ? Omega-3 Fatty Acids (FISH OIL) 1000 MG CAPS Take 1 capsule by mouth daily.  ? predniSONE (DELTASONE) 1 MG tablet Take 5 mg by mouth daily.  ? tacrolimus (PROGRAF) 1 MG capsule Take 2 capsules (2 mg total) by mouth 2 (two) times daily.  ? [DISCONTINUED] traZODone (DESYREL) 50 MG tablet Take 0.5-1 tablets (25-50 mg total) by mouth at bedtime as needed for sleep.  ? ? ?Allergies: ?Patient is allergic to erythromycin, ezetimibe, other, rofecoxib, statins, zonisamide, elavil [amitriptyline], and nsaids. ?Family History: ?Patient family history includes Alzheimer's disease in her mother; Cancer in her sister; Dementia in her mother; Diabetes in her brother and mother; Gout in her mother; Heart disease in her sister; Hypertension in her mother and sister; Stroke in her mother. ?Social History:  ?Patient  reports that she has never smoked. She has never used smokeless tobacco. She reports that she does not drink alcohol and does not use drugs. ? ?Review of Systems: ?Constitutional: Negative for fever malaise or anorexia ?Cardiovascular: negative for chest pain ?Respiratory: negative for SOB or persistent cough ?Gastrointestinal: negative for abdominal pain ? ?Objective  ?Vitals: BP 140/82   Pulse 95   Temp 98.2 ?F (36.8 ?C)   Ht '5\' 4"'$  (1.626 m)   Wt 191 lb (86.6 kg)   SpO2 99%   BMI 32.79 kg/m?  ?General: no acute distress , A&Ox3 ?HEENT: PEERL, conjunctiva normal, neck is supple ?Cardiovascular:  RRR without murmur or gallop.  ?Respiratory:  Good breath sounds bilaterally, CTAB with normal respiratory effort ?Mm: ttp ? ? ? ?Commons side effects, risks, benefits, and alternatives for medications and treatment plan prescribed today were discussed, and the patient expressed understanding of the given  instructions. Patient is instructed to call or message via MyChart if he/she has any questions or concerns regarding our treatment plan. No barriers to understanding were identified. We discussed Red Flag symptoms and signs in detail. Patient expressed understanding regarding what to do in case of urgent or emergency type symptoms.  ?Medication list was reconciled, printed and provided to the patient in AVS. Patient instructions and summary information was reviewed with the patient as documented in the AVS. ?This note was prepared with assistance of Systems analyst. Occasional wrong-word or sound-a-like substitutions may have occurred due to the inherent limitations of voice recognition software ? ?This visit occurred during the SARS-CoV-2 public health emergency.  Safety protocols were in place, including screening questions prior to the visit, additional usage of staff PPE, and extensive cleaning of exam room while observing  appropriate contact time as indicated for disinfecting solutions.  ? ?

## 2021-10-05 NOTE — Patient Instructions (Addendum)
Please follow up as scheduled for your next visit with me: 11/28/2021  ? ?I will release your lab results to you on your MyChart account with further instructions. You may see the results before I do, but when I review them I will send you a message with my report or have my assistant call you if things need to be discussed. Please reply to my message with any questions. Thank you!  ? ?Increase the trazadone to 2-3 pills nightly to help sleep.  ?Use the muscle relaxer as needed as well.  ? ?If you have any questions or concerns, please don't hesitate to send me a message via MyChart or call the office at [phone]. Thank you for visiting with [place] today! It's our pleasure caring for you.  ?

## 2021-10-09 ENCOUNTER — Telehealth: Payer: Self-pay

## 2021-10-09 NOTE — Telephone Encounter (Addendum)
LVM for pt to cb in regards to her questions/concerns with lab results. ? ?Spoke with pt regarding lab results/recommendations ? ?

## 2021-10-09 NOTE — Telephone Encounter (Signed)
Patient is calling in regard to labs.  I have given patient Dr. Tamela Oddi response in regard to recent labs.  Patient understood but would still like call back in regard.

## 2021-10-13 ENCOUNTER — Ambulatory Visit: Payer: Medicare Other | Admitting: Family Medicine

## 2021-10-17 ENCOUNTER — Ambulatory Visit: Payer: Medicare Other | Admitting: Family Medicine

## 2021-11-21 ENCOUNTER — Ambulatory Visit (HOSPITAL_BASED_OUTPATIENT_CLINIC_OR_DEPARTMENT_OTHER): Payer: Medicare Other | Admitting: Internal Medicine

## 2021-11-28 ENCOUNTER — Ambulatory Visit (INDEPENDENT_AMBULATORY_CARE_PROVIDER_SITE_OTHER)
Admission: RE | Admit: 2021-11-28 | Discharge: 2021-11-28 | Disposition: A | Discharge status: Home / Self Care | Payer: Medicare Other | Source: Ambulatory Visit | Attending: Family Medicine | Admitting: Family Medicine

## 2021-11-28 ENCOUNTER — Encounter: Payer: Self-pay | Admitting: Family Medicine

## 2021-11-28 ENCOUNTER — Ambulatory Visit (INDEPENDENT_AMBULATORY_CARE_PROVIDER_SITE_OTHER): Payer: Medicare Other | Admitting: Family Medicine

## 2021-11-28 VITALS — BP 139/90 | HR 94 | Temp 98.0°F | Ht 64.0 in | Wt 186.2 lb

## 2021-11-28 DIAGNOSIS — M25552 Pain in left hip: Secondary | ICD-10-CM

## 2021-11-28 DIAGNOSIS — M1612 Unilateral primary osteoarthritis, left hip: Secondary | ICD-10-CM | POA: Diagnosis not present

## 2021-11-28 DIAGNOSIS — M545 Low back pain, unspecified: Secondary | ICD-10-CM

## 2021-11-28 DIAGNOSIS — M47816 Spondylosis without myelopathy or radiculopathy, lumbar region: Secondary | ICD-10-CM | POA: Diagnosis not present

## 2021-11-28 DIAGNOSIS — F5101 Primary insomnia: Secondary | ICD-10-CM | POA: Diagnosis not present

## 2021-11-28 DIAGNOSIS — G8929 Other chronic pain: Secondary | ICD-10-CM | POA: Diagnosis not present

## 2021-11-28 DIAGNOSIS — M797 Fibromyalgia: Secondary | ICD-10-CM

## 2021-11-28 MED ORDER — OXYCODONE-ACETAMINOPHEN 5-325 MG PO TABS: 1.0000 | ORAL_TABLET | Freq: Two times a day (BID) | ORAL | 0 refills | Status: DC | PRN

## 2021-11-28 NOTE — Progress Notes (Signed)
Subjective  CC:  Chief Complaint  Patient presents with   Insomnia    Medication helping    Diabetes   Pain    Left Hip and leg no Hx injury     HPI: Cassandra [name] is a 61 y.o. female who presents to the office today to address the problems listed above in the chief complaint. Fibromyalgia flare: Fortunately much of her pain is finally calm down.  She is using Flexeril mostly at night and this is improving her sleep.  She did not tolerate the trazodone due to side effects.  Her overall generalized pain is much improved. However, over the last several days her left hip pain has worsened.  Now limping.  Pain is in the groin and left hip.  No sciatica.  No midline low back pain now.  She has chronic history of chronic back pain.  History of recurrent sciatica but this feels different.  Hip has been on and off over the last several months but now worsening.  No injury.  No recent imaging.  She is requesting Percocet.  Does not tolerate Norco.  NSAIDs are difficult due to her history of chronic kidney disease.  She has been using over-the-counter Aleve without results.  Assessment  1. Fibromyalgia   2. Primary insomnia   3. Chronic midline low back pain without sciatica   4. Left hip pain      Plan  Fibromyalgia flare: Improved.  Continue Flexeril at night Insomnia: Improved with Flexeril at night Left hip pain: Moderate to severe with limp: Refer to sports medicine, Percocet, 1 week supply and education given.  Patient declines pain referral.  She has been to the pain clinic in the past.  We will see if sports medicine can be helpful.  No further narcotics from me at this point and we will see if we can get her help from sports medicine orthopedics.  Patient understands and agrees with care plan.  She does have chronic knee osteoarthritis as well  Follow up: 3 months for complete physical and follow-up diabetes Visit date not found  Orders Placed This Encounter  Procedures   DG  HIP UNILAT W OR W/O PELVIS 2-3 VIEWS LEFT   Meds ordered this encounter  Medications   oxyCODONE-acetaminophen (PERCOCET/ROXICET) 5-325 MG tablet    Sig: Take 1 tablet by mouth 2 (two) times daily as needed for severe pain.    Dispense:  20 tablet    Refill:  0      I reviewed the patients updated PMH, FH, and SocHx.    Patient Active Problem List   Diagnosis Date Noted   Controlled type 2 diabetes mellitus without complication, without long-term current use of insulin ([facility]CC) 08/28/2021    Priority: High   Chronic kidney disease (CKD) stage G3a/A1, moderately decreased glomerular filtration rate (GFR) between 45-59 mL/min/1.73 square meter and albuminuria creatinine ratio less than 30 mg/g ([facility]) 03/01/2021    Priority: High   Statin intolerance 05/13/2018    Priority: High   Primary insomnia 11/04/2017    Priority: High   Immunosuppressed status ([facility]) 10/07/2017    Priority: High   Adenomatous polyp of colon 09/28/2017    Priority: High   Mixed hyperlipidemia 03/10/2013    Priority: High   Primary osteoarthritis of knees, bilateral 03/01/2013    Priority: High   Chronic back pain 10/13/2012    Priority: High   History of liver transplant ([facility]) 02/22/2009    Priority: High  Migraine with aura and without status migrainosus, not intractable 07/03/2018    Priority: Medium    Cervical stenosis of spine 01/25/2017    Priority: Medium    Fibromyalgia 08/24/2013    Priority: Medium    Hx of tuberculosis 10/13/2012    Priority: Medium    Anxiety disorder 09/16/2007    Priority: Medium    AS (sickle cell trait) ([facility]) 06/29/2019    Priority: Low   Myopathy, unspecified 02/28/2021   Cervical facet joint syndrome 03/28/2018   Spondylosis of cervical region without myelopathy or radiculopathy 03/28/2018   Lumbar facet arthropathy 12/27/2017   Current Meds  Medication Sig   cyclobenzaprine (FLEXERIL) 10 MG tablet Take 1 tablet (10 mg total) by mouth 3 (three) times daily as  needed for muscle spasms (and pain).   diclofenac Sodium (VOLTAREN) 1 % GEL Apply 4 g topically 4 (four) times daily as needed.   fluticasone (FLONASE) 50 MCG/ACT nasal spray SHAKE LIQUID AND USE 2 SPRAYS IN EACH NOSTRIL DAILY   levocetirizine (XYZAL) 5 MG tablet Take 1 tablet (5 mg total) by mouth every evening.   mycophenolate (CELLCEPT) 500 MG tablet Take 500 mg by mouth 2 (two) times daily.   olopatadine (PATANOL) 0.1 % ophthalmic solution INSTILL 1 DROP IN BOTH EYES TWICE DAILY   Omega-3 Fatty Acids (FISH OIL) 1000 MG CAPS Take 1 capsule by mouth daily.   predniSONE (DELTASONE) 1 MG tablet Take 5 mg by mouth daily.   tacrolimus (PROGRAF) 1 MG capsule Take 2 capsules (2 mg total) by mouth 2 (two) times daily.    Allergies: Patient is allergic to erythromycin, ezetimibe, other, rofecoxib, statins, zonisamide, elavil [amitriptyline], and nsaids. Family History: Patient family history includes Alzheimer's disease in her mother; Cancer in her sister; Dementia in her mother; Diabetes in her brother and mother; Gout in her mother; Heart disease in her sister; Hypertension in her mother and sister; Stroke in her mother. Social History:  Patient  reports that she has never smoked. She has never used smokeless tobacco. She reports that she does not drink alcohol and does not use drugs.  Review of Systems: Constitutional: Negative for fever malaise or anorexia Cardiovascular: negative for chest pain Respiratory: negative for SOB or persistent cough Gastrointestinal: negative for abdominal pain  Objective  Vitals: BP 139/90   Pulse 94   Temp 98 F (36.7 C) (Temporal)   Ht 5\' 4"  (1.626 m)   Wt 186 lb 3.2 oz (84.5 kg)   SpO2 99%   BMI 31.96 kg/m  General: no acute distress , A&Ox3 Gait: Antalgic with limp Left groin tender, no lateral hip tenderness, fair range of motion.    Commons side effects, risks, benefits, and alternatives for medications and treatment plan prescribed today were  discussed, and the patient expressed understanding of the given instructions. Patient is instructed to call or message via MyChart if he/she has any questions or concerns regarding our treatment plan. No barriers to understanding were identified. We discussed Red Flag symptoms and signs in detail. Patient expressed understanding regarding what to do in case of urgent or emergency type symptoms.  Medication list was reconciled, printed and provided to the patient in AVS. Patient instructions and summary information was reviewed with the patient as documented in the AVS. This note was prepared with assistance of Dragon voice recognition software. Occasional wrong-word or sound-a-like substitutions may have occurred due to the inherent limitations of voice recognition software  This visit occurred during the SARS-CoV-2 public health  emergency.  Safety protocols were in place, including screening questions prior to the visit, additional usage of staff PPE, and extensive cleaning of exam room while observing appropriate contact time as indicated for disinfecting solutions.

## 2021-12-08 NOTE — Progress Notes (Unsigned)
Subjective:    CC: L hip pain  I, Molly Weber, LAT, ATC, am serving as scribe for Dr. [name].  HPI: Pt is a 61 y/o female presenting w/ c/o L hip and groin pain x few months that has recently been worsening.  She locates her pain to her L anterior and lateral hip.  She reports having been in an accident over a year ago and injured her tailbone  Radiating pain: yes into her L lateral thing and sometimes into the L lower leg L hip mechanical symptoms: no Aggravating factors: walking; prolonged standing; laying down at night in the cold air; transitioning from sitting to standing Treatments tried: Aleve; Flexeril for fibromyalgia; Percocet; ice; Votaren gel  Diagnostic testing: L hip XR- 11/28/21  Pertinent review of Systems: No fevers or chills  Relevant historical information: Liver transplant history.  Sickle cell trait.   Objective:    Vitals:   12/13/21 0858  BP: 128/82  Pulse: (!) 104  SpO2: 98%   General: Well Developed, well nourished, and in no acute distress.   MSK: Left hip: Normal-appearing Range of motion intact.  Some pain with flexion and rotation. Tender palpation greater trochanter. Strength reduced abduction and external rotation with pain.  Right hip: Normal. Normal range of motion. Tender palpation greater trochanter. Again reduced strength to abduction and external rotation both with pain.   Lab and Radiology Results   EXAM: DG HIP (WITH OR WITHOUT PELVIS) 2-3V LEFT   COMPARISON:  CT 07/27/2021.   FINDINGS: Degenerative changes lower lumbar spine, both SI joints, and both hips. Tiny bony densities noted adjacent to the left acetabulum consistent with tiny fracture fragments and or degenerative change, age undetermined. Corticated bony densities again noted adjacent to the left greater trochanter, consistent with old injury. 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 Iliofemoral atherosclerotic vascular calcification. Pelvic calcifications consistent with phleboliths.   IMPRESSION: 1. Degenerative changes lumbar spine, both SI joints, and both hips. Tiny bony densities noted adjacent to the left acetabulum consistent with tiny fracture fragments and or degenerative change, age undetermined.   2. 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   3.  Iliofemoral atherosclerotic vascular disease.     Electronically Signed   By: Marcello Moores  Register M.D.   On: 11/29/2021 07:51      I, [name], personally (independently) visualized and performed the interpretation of the images attached in this note.    Impression and Recommendations:    Assessment and Plan: 61 y.o. female with lateral hip pain left worse than right.  Pain is multifactorial.  Some of her pain is lateral hip related to greater trochanteric bursitis or hip abductor tendinopathy.  Some of her pain is anterior thought to be related to DJD or perhaps even AVN on the right. We discussed options.  She is a good candidate for physical therapy for her hip pain as well as her low back pain as a first attempt.  Will refer to physical therapy and check back in 6 weeks.  If not better would consider injection series.  Targets could include the greater trochanter bursa or even the intra-articular hip.Marland Kitchen  PDMP not reviewed this encounter. Orders Placed This Encounter  Procedures   Ambulatory referral to Physical Therapy    Referral Priority:   Routine  Referral Type:   Physical Medicine    Referral Reason:   Specialty Services Required    Requested Specialty:   Physical Therapy    Number of Visits Requested:   1   No orders of the defined types were placed in this encounter.   Discussed warning signs or symptoms. Please see discharge instructions. Patient  expresses understanding.   The above documentation has been reviewed and is accurate and complete [name], M.D.

## 2021-12-13 ENCOUNTER — Encounter: Payer: Self-pay | Admitting: Family Medicine

## 2021-12-13 ENCOUNTER — Ambulatory Visit: Payer: Medicare Other | Admitting: Family Medicine

## 2021-12-13 ENCOUNTER — Ambulatory Visit: Payer: Self-pay

## 2021-12-13 VITALS — BP 128/82 | HR 104 | Ht 64.0 in | Wt 187.6 lb

## 2021-12-13 DIAGNOSIS — M25551 Pain in right hip: Secondary | ICD-10-CM

## 2021-12-13 DIAGNOSIS — M545 Low back pain, unspecified: Secondary | ICD-10-CM

## 2021-12-13 DIAGNOSIS — G8929 Other chronic pain: Secondary | ICD-10-CM

## 2021-12-13 DIAGNOSIS — M25552 Pain in left hip: Secondary | ICD-10-CM

## 2021-12-13 NOTE — Patient Instructions (Addendum)
Nice to meet you today.  I've referred you to Physical Therapy.  Their office will call you to schedule but please let us know if you don't hear from them in one week regarding scheduling.  Follow-up: 6 weeks  TENS UNIT: This is helpful for muscle pain and spasm.   Search and Purchase a TENS 7000 2nd edition at  www.tenspros.com or www.[email or web address] It should be less than $30.     TENS unit instructions: Do not shower or bathe with the unit on Turn the unit off before removing electrodes or batteries If the electrodes lose stickiness add a drop of water to the electrodes after they are disconnected from the unit and place on plastic sheet. If you continued to have difficulty, call the TENS unit company to purchase more electrodes. Do not apply lotion on the skin area prior to use. Make sure the skin is clean and dry as this will help prolong the life of the electrodes. After use, always check skin for unusual red areas, rash or other skin difficulties. If there are any skin problems, does not apply electrodes to the same area. Never remove the electrodes from the unit by pulling the wires. Do not use the TENS unit or electrodes other than as directed. Do not change electrode placement without consultating your therapist or physician. Keep 2 fingers with between each electrode. Wear time ratio is 2:1, on to off times.    For example on for 30 minutes off for 15 minutes and then on for 30 minutes off for 15 minutes

## 2021-12-29 DIAGNOSIS — D849 Immunodeficiency, unspecified: Secondary | ICD-10-CM | POA: Diagnosis not present

## 2021-12-29 DIAGNOSIS — Z944 Liver transplant status: Secondary | ICD-10-CM | POA: Diagnosis not present

## 2022-01-04 ENCOUNTER — Other Ambulatory Visit: Payer: Self-pay | Admitting: Family Medicine

## 2022-01-08 DIAGNOSIS — D849 Immunodeficiency, unspecified: Secondary | ICD-10-CM | POA: Diagnosis not present

## 2022-01-08 DIAGNOSIS — Z944 Liver transplant status: Secondary | ICD-10-CM | POA: Diagnosis not present

## 2022-01-10 ENCOUNTER — Ambulatory Visit: Payer: Medicare Other | Admitting: Physical Therapy

## 2022-01-17 DIAGNOSIS — L905 Scar conditions and fibrosis of skin: Secondary | ICD-10-CM | POA: Diagnosis not present

## 2022-01-17 DIAGNOSIS — L821 Other seborrheic keratosis: Secondary | ICD-10-CM | POA: Diagnosis not present

## 2022-01-18 ENCOUNTER — Ambulatory Visit: Payer: Medicare Other | Admitting: Physical Therapy

## 2022-01-18 DIAGNOSIS — M25561 Pain in right knee: Secondary | ICD-10-CM | POA: Diagnosis not present

## 2022-01-18 DIAGNOSIS — G8929 Other chronic pain: Secondary | ICD-10-CM

## 2022-01-18 DIAGNOSIS — M25551 Pain in right hip: Secondary | ICD-10-CM

## 2022-01-18 DIAGNOSIS — M25552 Pain in left hip: Secondary | ICD-10-CM | POA: Diagnosis not present

## 2022-01-18 NOTE — Therapy (Signed)
OUTPATIENT PHYSICAL THERAPY LOWER EXTREMITY EVALUATION   Patient Name: Cassandra [name] MRN: [id number] DOB:04-22-1961, 61 y.o., female Today's Date: 01/18/2022   PT End of Session - 01/21/22 1101     Visit Number 1    Number of Visits 16    Date for PT Re-Evaluation 03/15/22    Authorization Type UHC Medicare    PT Start Time 1017    PT Stop Time 1100    PT Time Calculation (min) 43 min    Activity Tolerance Patient tolerated treatment well    Behavior During Therapy WFL for tasks assessed/performed             Past Medical History:  Diagnosis Date   Anxiety    AS (sickle cell trait) ([place]) 06/29/2019   Autoimmune hepatitis ([place])    Chronic renal insufficiency    Cirrhosis ([place])    COLONIC POLYPS, ADENOMATOUS, HX OF 05/05/2008   Qualifier: Diagnosis of  By: Harlon Ditty CMA (AAMA), [name]     Depression    External hemorrhoids    Fibromyalgia    GERD (gastroesophageal reflux disease)    Hyperlipidemia    Internal hemorrhoids    Iron deficiency anemia    Liver replaced by transplant ([place]) 02/22/2009   Qualifier: Diagnosis of  By: Chester Holstein NP, Nevin Bloodgood     Migraine    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    Ruptured disc, cervical    multiple levels   Statin intolerance 05/13/2018   Past Surgical History:  Procedure Laterality Date   ABDOMINAL HYSTERECTOMY  2010   KNEE ARTHROSCOPY Right 2010   LIVER TRANSPLANT  1992   ROTATOR CUFF REPAIR Left 2010   x2   Patient Active Problem List   Diagnosis Date Noted   Controlled type 2 diabetes mellitus without complication, without long-term current use of insulin ([place]) 08/28/2021   Chronic kidney disease (CKD) stage G3a/A1, moderately decreased glomerular filtration rate (GFR) between 45-59 mL/min/1.73 square meter and albuminuria creatinine ratio less than 30 mg/g ([place]) 03/01/2021   Myopathy, unspecified 02/28/2021   AS (sickle cell trait) ([place]) 06/29/2019   Migraine with aura and without status migrainosus, not  intractable 07/03/2018   Statin intolerance 05/13/2018   Cervical facet joint syndrome 03/28/2018   Spondylosis of cervical region without myelopathy or radiculopathy 03/28/2018   Lumbar facet arthropathy 12/27/2017   Primary insomnia 11/04/2017   Immunosuppressed status ([place]) 10/07/2017   Adenomatous polyp of colon 09/28/2017   Cervical stenosis of spine 01/25/2017   Fibromyalgia 08/24/2013   Mixed hyperlipidemia 03/10/2013   Primary osteoarthritis of knees, bilateral 03/01/2013   Chronic back pain 10/13/2012   Hx of tuberculosis 10/13/2012   History of liver transplant ([place]) 02/22/2009   Anxiety disorder 09/16/2007    PCP: Billey Chang   REFERRING PROVIDER: [name]  REFERRING DIAG: bil hip pain, low back pain  THERAPY DIAG:  Pain in left hip  Pain in right hip  Chronic pain of right knee  Rationale for Evaluation and Treatment Rehabilitation  ONSET DATE:   SUBJECTIVE:   SUBJECTIVE STATEMENT:  Pt states Bil hip pain, Usually worse on the L. Feels like she cant stand up straight with initial standing.  Pain into groin on L. New pain, since June.  Kidney Transplant 34 Also has R knee pain /OA Feels a little dizzy when laying on mat table during Evaluation.  BP: 140/94.   108 pulse.   PERTINENT HISTORY: TIA- feb 23,   PAIN:  Are you having pain? Yes:  NPRS scale: 8/10 Pain location: L hip Pain description: stiff, sore Aggravating factors: standing, walking, transfers Relieving factors: none stated   Are you having pain? Yes: NPRS scale: 5/10 Pain location: R hip Pain description: stiff, sore Aggravating factors: standing, transfers,  Relieving factors: none stated.   PRECAUTIONS: None  WEIGHT BEARING RESTRICTIONS No  FALLS:  Has patient fallen in last 6 months? No   PLOF: Independent  PATIENT GOALS   Decreased    OBJECTIVE:   DIAGNOSTIC FINDINGS:   COGNITION:  Overall cognitive status: Within functional limits for tasks  assessed      POSTURE: Standing: L hip higher, R knee valgus, possible scoliosis at t/l spine.   PALPATION:   LOWER EXTREMITY ROM:  Mild stiffness in bil hips, L >R. Knees: WFL  Lumbar: Mod limitation for flexion, extension, Mild/mod limitation for SB.  LOWER EXTREMITY MMT:  MMT Right eval Left eval  Hip flexion 4- 4-  Hip extension    Hip abduction 4- 4-  Hip adduction    Hip internal rotation    Hip external rotation    Knee flexion 4 4+  Knee extension 4 4+  Ankle dorsiflexion    Ankle plantarflexion    Ankle inversion    Ankle eversion     (Blank rows = not tested)  LOWER EXTREMITY SPECIAL TESTS:   GAIT Distance walked: 50 ft x2;  Assistive device utilized: None Level of assistance: Independent  Comments: posture:     TODAY'S TREATMENT: See below for HEP   PATIENT EDUCATION:  Education details: PT POC, Exam findings, HEP Person educated: Patient Education method: Explanation, Demonstration, Tactile cues, Verbal cues, and Handouts Education comprehension: verbalized understanding, returned demonstration, verbal cues required, tactile cues required, and needs further education   HOME EXERCISE PROGRAM: Access Code: [email or web address] URL: [email or web address] Date: 01/18/2022 Prepared by: Lyndee Hensen  Exercises - Clamshell  - 1 x daily - 1-2 sets - 10 reps - Standing Scapular Retraction  - 1 x daily - 2 sets - 10 reps  ASSESSMENT:  CLINICAL IMPRESSION: Patient presents with primary complaint of increased pain in bil l>R hips, and R knee. Pt with decreased posture, with likely scoliosis and valgus of R knee. Posture and weakness in LEs are effecting gait mechanics and ability. Pt with decreased ability for full functional activities and IADLs. Pt to benefit from skilled PT to improve.   OBJECTIVE IMPAIRMENTS Abnormal gait, decreased activity tolerance, decreased mobility, difficulty walking, decreased ROM, decreased strength, increased muscle  spasms, impaired flexibility, improper body mechanics, and pain.   ACTIVITY LIMITATIONS carrying, lifting, bending, standing, squatting, stairs, transfers, and locomotion level  PARTICIPATION LIMITATIONS: meal prep, cleaning, laundry, shopping, and community activity  PERSONAL FACTORS  none  are also affecting patient's functional outcome.   REHAB POTENTIAL: Good  CLINICAL DECISION MAKING: Stable/uncomplicated  EVALUATION COMPLEXITY: Low   GOALS: Goals reviewed with patient? Yes  SHORT TERM GOALS: Target date: 02/01/2022   Pt to be independent with initial HEP  Goal status: INITIAL  2.  Pt to demo improved ability for sit to to stand, with ability for full upright posture with initial standing.   Goal status: INITIAL    LONG TERM GOALS: Target date: 03/15/2022   Pt to be independent with final HEP  Goal status: INITIAL  2.  Pt to report decreased pain in bil hips to 0-3/10 with standing activity and transfers.  :  Goal status: INITIAL  3.  Pt to demo improved strength of Bil hips and  knees to at least 4+/5 to improve stability and pain   Goal status: INITIAL  4. Pt to demo improved upright posture and gait mechanics to be [facility] for pt age and dx, for improved abilty for exercise and walking up to 25 mi .   Goal status: INITIAL    PLAN: PT FREQUENCY: 2x/week  PT DURATION: 8 weeks  PLANNED INTERVENTIONS: Therapeutic exercises, Therapeutic activity, Neuromuscular re-education, Balance training, Gait training, Patient/Family education, Self Care, Joint mobilization, Joint manipulation, Stair training, DME instructions, Aquatic Therapy, Dry Needling, Electrical stimulation, Spinal manipulation, Spinal mobilization, Cryotherapy, Moist heat, Taping, Vasopneumatic device, Traction, Ultrasound, Ionotophoresis '4mg'$ /ml Dexamethasone, and Manual therapy  PLAN FOR NEXT SESSION:   Lyndee Hensen, PT, DPT 11:32 AM  01/21/22

## 2022-01-21 ENCOUNTER — Encounter: Payer: Self-pay | Admitting: Physical Therapy

## 2022-01-22 ENCOUNTER — Encounter (HOSPITAL_BASED_OUTPATIENT_CLINIC_OR_DEPARTMENT_OTHER): Payer: Self-pay | Admitting: Emergency Medicine

## 2022-01-22 ENCOUNTER — Other Ambulatory Visit: Payer: Self-pay

## 2022-01-22 ENCOUNTER — Emergency Department (HOSPITAL_BASED_OUTPATIENT_CLINIC_OR_DEPARTMENT_OTHER)
Admission: EM | Admit: 2022-01-22 | Discharge: 2022-01-22 | Disposition: A | Discharge status: Home / Self Care | Payer: Medicare Other | Attending: Emergency Medicine | Admitting: Emergency Medicine

## 2022-01-22 ENCOUNTER — Emergency Department (HOSPITAL_BASED_OUTPATIENT_CLINIC_OR_DEPARTMENT_OTHER): Payer: Medicare Other

## 2022-01-22 ENCOUNTER — Telehealth: Payer: Self-pay | Admitting: Family Medicine

## 2022-01-22 DIAGNOSIS — M791 Myalgia, unspecified site: Secondary | ICD-10-CM | POA: Diagnosis not present

## 2022-01-22 DIAGNOSIS — R600 Localized edema: Secondary | ICD-10-CM | POA: Insufficient documentation

## 2022-01-22 DIAGNOSIS — E119 Type 2 diabetes mellitus without complications: Secondary | ICD-10-CM | POA: Diagnosis not present

## 2022-01-22 DIAGNOSIS — I10 Essential (primary) hypertension: Secondary | ICD-10-CM | POA: Insufficient documentation

## 2022-01-22 DIAGNOSIS — N3000 Acute cystitis without hematuria: Secondary | ICD-10-CM

## 2022-01-22 DIAGNOSIS — R531 Weakness: Secondary | ICD-10-CM | POA: Diagnosis present

## 2022-01-22 DIAGNOSIS — M79604 Pain in right leg: Secondary | ICD-10-CM

## 2022-01-22 DIAGNOSIS — M79605 Pain in left leg: Secondary | ICD-10-CM | POA: Diagnosis not present

## 2022-01-22 LAB — COMPREHENSIVE METABOLIC PANEL
ALT: 26 U/L (ref 0–44)
AST: 33 U/L (ref 15–41)
Albumin: 3.8 g/dL (ref 3.5–5.0)
Alkaline Phosphatase: 81 U/L (ref 38–126)
Anion gap: 9 (ref 5–15)
BUN: 20 mg/dL (ref 8–23)
CO2: 20 mmol/L — ABNORMAL LOW (ref 22–32)
Calcium: 9.3 mg/dL (ref 8.9–10.3)
Chloride: 108 mmol/L (ref 98–111)
Creatinine, Ser: 1.12 mg/dL — ABNORMAL HIGH (ref 0.44–1.00)
GFR, Estimated: 56 mL/min — ABNORMAL LOW (ref >60)
Glucose, Bld: 173 mg/dL — ABNORMAL HIGH (ref 70–99)
Potassium: 3.9 mmol/L (ref 3.5–5.1)
Sodium: 137 mmol/L (ref 135–145)
Total Bilirubin: 0.6 mg/dL (ref 0.3–1.2)
Total Protein: 7.1 g/dL (ref 6.5–8.1)

## 2022-01-22 LAB — URINALYSIS, ROUTINE W REFLEX MICROSCOPIC
Bilirubin Urine: NEGATIVE
Glucose, UA: NEGATIVE mg/dL
Ketones, ur: NEGATIVE mg/dL
Nitrite: NEGATIVE
Protein, ur: NEGATIVE mg/dL
Specific Gravity, Urine: 1.015 (ref 1.005–1.030)
pH: 5.5 (ref 5.0–8.0)

## 2022-01-22 LAB — CBC
HCT: 36.9 % (ref 36.0–46.0)
Hemoglobin: 12.9 g/dL (ref 12.0–15.0)
MCH: 29.3 pg (ref 26.0–34.0)
MCHC: 35 g/dL (ref 30.0–36.0)
MCV: 83.9 fL (ref 80.0–100.0)
Platelets: 180 10*3/uL (ref 150–400)
RBC: 4.4 MIL/uL (ref 3.87–5.11)
RDW: 13 % (ref 11.5–15.5)
WBC: 6.2 10*3/uL (ref 4.0–10.5)
nRBC: 0 % (ref 0.0–0.2)

## 2022-01-22 LAB — URINALYSIS, MICROSCOPIC (REFLEX)

## 2022-01-22 LAB — CK: Total CK: 149 U/L (ref 38–234)

## 2022-01-22 LAB — BRAIN NATRIURETIC PEPTIDE: B Natriuretic Peptide: 20.4 pg/mL (ref 0.0–100.0)

## 2022-01-22 MED ORDER — OXYCODONE-ACETAMINOPHEN 5-325 MG PO TABS: 1.0000 | ORAL_TABLET | Freq: Four times a day (QID) | ORAL | 0 refills | Status: DC | PRN

## 2022-01-22 MED ORDER — CEPHALEXIN 500 MG PO CAPS: 500.0000 mg | ORAL_CAPSULE | Freq: Four times a day (QID) | ORAL | 0 refills | Status: AC

## 2022-01-22 MED ORDER — CEPHALEXIN 500 MG PO CAPS: 500.0000 mg | ORAL_CAPSULE | Freq: Four times a day (QID) | ORAL | 0 refills | Status: DC

## 2022-01-22 NOTE — ED Provider Notes (Signed)
[facility] EMERGENCY DEPARTMENT Provider Note   CSN: [id number] Arrival date & time: 01/22/22  1429     History  Chief Complaint  Patient presents with   Medication Reaction   Weakness    Cassandra [name] is a 61 y.o. female.  The history is provided by the patient and medical records. No language interpreter was used.  Weakness Severity:  Mild Onset quality:  Gradual Duration:  5 days Timing:  Constant Progression:  Unchanged Chronicity:  New Context: stress   Context: not alcohol use   Relieved by:  Nothing Worsened by:  Nothing Ineffective treatments:  None tried Associated symptoms: myalgias   Associated symptoms: no abdominal pain, no aphasia, no arthralgias, no chest pain, no cough, no diarrhea, no drooling, no dysuria, no numbness in extremities, no falls, no fever, no foul-smelling urine, no frequency, no headaches, no lethargy, no loss of consciousness, no melena, no nausea, no shortness of breath, no stroke symptoms, no vision change and no vomiting   Risk factors: new medications        Home Medications Prior to Admission medications   Medication Sig Start Date End Date Taking? Authorizing Provider  cyclobenzaprine (FLEXERIL) 10 MG tablet TAKE 1 TABLET BY MOUTH THREE TIMES DAILY AS NEEDED 01/04/22   Leamon Arnt, MD  diclofenac Sodium (VOLTAREN) 1 % GEL Apply 4 g topically 4 (four) times daily as needed. 02/22/20   Leamon Arnt, MD  fluticasone [facility]) 50 MCG/ACT nasal spray SHAKE LIQUID AND USE 2 SPRAYS IN [facility] NOSTRIL DAILY 05/11/20   Leamon Arnt, MD  levocetirizine (XYZAL) 5 MG tablet Take 1 tablet (5 mg total) by mouth every evening. 02/10/21   Scot Jun, FNP  mycophenolate (CELLCEPT) 500 MG tablet Take 500 mg by mouth 2 (two) times daily.    [provider]  olopatadine (PATANOL) 0.1 % ophthalmic solution INSTILL 1 DROP IN BOTH EYES TWICE DAILY 07/28/20   Leamon Arnt, MD  Omega-3 Fatty Acids (FISH OIL) 1000 MG CAPS  Take 1 capsule by mouth daily. 09/02/15   [provider]  predniSONE (DELTASONE) 1 MG tablet Take 5 mg by mouth daily. 07/13/21   [provider]  tacrolimus (PROGRAF) 1 MG capsule Take 2 capsules (2 mg total) by mouth 2 (two) times daily. 02/22/20   Leamon Arnt, MD      Allergies    Erythromycin, Ezetimibe, Other, Rofecoxib, Statins, Zonisamide, Carvacrol, Elavil [amitriptyline], Metoprolol, and Nsaids    Review of Systems   Review of Systems  Constitutional:  Positive for fatigue. Negative for chills, diaphoresis and fever.  HENT:  Negative for congestion and drooling.   Eyes:  Negative for visual disturbance.  Respiratory:  Negative for cough, chest tightness, shortness of breath, wheezing and stridor.   Cardiovascular:  Negative for chest pain.  Gastrointestinal:  Negative for abdominal pain, constipation, diarrhea, melena, nausea and vomiting.  Genitourinary:  Negative for dysuria, flank pain, frequency and hematuria.  Musculoskeletal:  Positive for myalgias. Negative for arthralgias, falls, neck pain and neck stiffness.  Skin:  Negative for rash and wound.  Neurological:  Positive for weakness. Negative for loss of consciousness, speech difficulty, light-headedness, numbness and headaches.  Psychiatric/Behavioral:  Negative for agitation and confusion.   All other systems reviewed and are negative.   Physical Exam Updated Vital Signs BP (!) 152/92 (BP Location: Right Arm)   Pulse 94   Temp 99 F (37.2 C) (Oral)   Resp 17   Ht  $'5\' 4"'l$  (1.626 m)   Wt 80.7 kg   SpO2 100%   BMI 30.55 kg/m  Physical Exam Vitals and nursing note reviewed.  Constitutional:      General: She is not in acute distress.    Appearance: She is well-developed. She is not ill-appearing, toxic-appearing or diaphoretic.  HENT:     Head: Normocephalic and atraumatic.     Mouth/Throat:     Mouth: Mucous membranes are moist.  Eyes:     Extraocular Movements: Extraocular movements  intact.     Conjunctiva/sclera: Conjunctivae normal.     Pupils: Pupils are equal, round, and reactive to light.  Cardiovascular:     Rate and Rhythm: Normal rate and regular rhythm.     Heart sounds: No murmur heard. Pulmonary:     Effort: Pulmonary effort is normal. No respiratory distress.     Breath sounds: Normal breath sounds. No wheezing, rhonchi or rales.  Chest:     Chest wall: No tenderness.  Abdominal:     General: Abdomen is flat.     Palpations: Abdomen is soft.     Tenderness: There is no abdominal tenderness. There is no right CVA tenderness, left CVA tenderness, guarding or rebound.  Musculoskeletal:        General: Tenderness present. No swelling.     Cervical back: Neck supple. No tenderness.     Right lower leg: No edema.     Left lower leg: No edema.  Skin:    General: Skin is warm and dry.     Capillary Refill: Capillary refill takes less than 2 seconds.     Findings: No erythema or rash.  Neurological:     General: No focal deficit present.     Mental Status: She is alert.     Sensory: No sensory deficit.     Motor: No weakness.  Psychiatric:        Mood and Affect: Mood normal.     ED Results / Procedures / Treatments   Labs (all labs ordered are listed, but only abnormal results are displayed) Labs Reviewed  COMPREHENSIVE METABOLIC PANEL - Abnormal; Notable for the following components:      Result Value   CO2 20 (*)    Glucose, Bld 173 (*)    Creatinine, Ser 1.12 (*)    GFR, Estimated 56 (*)    All other components within normal limits  URINALYSIS, ROUTINE W REFLEX MICROSCOPIC - Abnormal; Notable for the following components:   APPearance CLOUDY (*)    Hgb urine dipstick SMALL (*)    Leukocytes,Ua LARGE (*)    All other components within normal limits  URINALYSIS, MICROSCOPIC (REFLEX) - Abnormal; Notable for the following components:   Bacteria, UA FEW (*)    All other components within normal limits  URINE CULTURE  CBC  BRAIN  NATRIURETIC PEPTIDE  CK    EKG EKG Interpretation  Date/Time:  Monday January 22 2022 14:50:19 EDT Ventricular Rate:  98 PR Interval:  150 QRS Duration: 78 QT Interval:  346 QTC Calculation: 441 R Axis:   -4 Text Interpretation: Sinus rhythm with Premature atrial complexes Minimal voltage criteria for LVH, may be normal variant ( R in aVL ) Possible Anterolateral infarct , age undetermined Abnormal ECG When compared with ECG of 27-Jul-2021 14:41, PREVIOUS ECG IS PRESENT when compared to prior, similar appearance with new PAC. NO STEMI Confirmed by Antony Blackbird [phone]) on 01/22/2022 6:05:13 PM  Radiology US Venous Img Lower Bilateral (DVT)  Result Date: 01/22/2022 CLINICAL DATA:  Lower extremity pain. EXAM: Bilateral LOWER EXTREMITY VENOUS DOPPLER ULTRASOUND TECHNIQUE: Gray-scale sonography with compression, as well as color and duplex ultrasound, were performed to evaluate the deep venous system(s) from the level of the common femoral vein through the popliteal and proximal calf veins. COMPARISON:  None Available. FINDINGS: VENOUS Normal compressibility of the common femoral, superficial femoral, and popliteal veins, as well as the visualized calf veins. Visualized portions of profunda femoral vein and great saphenous vein unremarkable. No filling defects to suggest DVT on grayscale or color Doppler imaging. Doppler waveforms show normal direction of venous flow, normal respiratory plasticity and response to augmentation. Limited views of the contralateral common femoral vein are unremarkable. OTHER None. Limitations: none IMPRESSION: Negative. Electronically Signed   By: Anner Crete M.D.   On: 01/22/2022 20:51    Procedures Procedures    Medications Ordered in ED Medications - No data to display  ED Course/ Medical Decision Making/ A&P                           Medical Decision Making Amount and/or Complexity of Data Reviewed Labs: ordered.  Risk Prescription drug  management.    ARRION BURRUEL is a 61 y.o. female with a past medical history significant for liver transplant on immunosuppression, fibromyalgia, previous TB, hyperlipidemia, hypertension, diabetes, migraines, and anxiety who presents with bilateral leg soreness, ankle swelling, and some lightheadedness.  She said that she had been on sodium bicarb after she was found to have a low CO2 recently and then stopped them over the weekend when the symptoms began.  She thinks it could be related to her sodium bicarb intake.  She reports that she just got home from out of town being at funeral for her brother who recently passed and has been having worsened pain in both legs and both the thighs, calves, hamstrings.  She reports the ankles have not been hurting as much but she is noted swelling in both ankles.  Denies any history of DVT or PE.  Denies any focal numbness but thinks subjectively that both legs feel slightly weak compared to previously.  Denies any new back pain or flank pain.  Denies any fevers, chills, congestion, cough, nausea, vomiting, constipation, diarrhea, or urinary changes.  Patient reports that she called her pharmacist who told her that it could have been related to the sodium bicarb so she stopped that.  She also reports that during physical therapy last week she had a near syncopal episode but has not had any further episodes of this.  On exam, lungs clear and chest nontender.  Abdomen nontender.  Normal bowel sounds.  Back and flanks nontender.  No midline back tenderness.  Patient had diffuse tenderness in both legs in the large muscle groups.  She did have intact sensation, strength, and pulses in both legs.  Possible mild swelling in both ankles but not clinically evident.  She had symmetric strength and there was no focality.  Given patient's symptoms being after starting the medications, this could be related to the sodium bicarb however with recent travel, will get ultrasound  to rule out DVT.  With her report edema symmetrically we will get a BNP and we had the other screening labs.  If work-up is reassuring, anticipate discharge to follow-up with her PCP and transplant team.   Work-up revealed evidence of possible UTI.  On reassessment she is reporting urinary frequency and would  like to be treated with antibiotics.  Clinically I suspect her muscle ache and spasms may have been related to some of recent medication changes however patient agrees with antibiotics for the urine.  She requested some pain medicine which we will provide and she reports she is taken Tylenol in the past despite her liver transplant.  Ultrasound of the legs did not show notes of DVT.  BNP normal.  Patient otherwise well-appearing.  Patient would like to go home and given her otherwise reassuring work-up this is reasonable.  She understood return precautions and follow-up instructions.  She no other questions or concerns and was discharged in good condition.        Final Clinical Impression(s) / ED Diagnoses Final diagnoses:  Acute cystitis without hematuria  Leg pain, bilateral  Myalgia    Rx / DC Orders ED Discharge Orders          Ordered    oxyCODONE-acetaminophen (PERCOCET/ROXICET) 5-325 MG tablet  Every 6 hours PRN,   Status:  Discontinued        01/22/22 2321    cephALEXin (KEFLEX) 500 MG capsule  4 times daily,   Status:  Discontinued        01/22/22 2321    cephALEXin (KEFLEX) 500 MG capsule  4 times daily        01/22/22 2322    oxyCODONE-acetaminophen (PERCOCET/ROXICET) 5-325 MG tablet  Every 6 hours PRN        01/22/22 2322            Clinical Impression: 1. Acute cystitis without hematuria   2. Leg pain, bilateral   3. Myalgia     Disposition: Discharge  Condition: Good  I have discussed the results, Dx and Tx plan with the pt(& family if present). He/she/they expressed understanding and agree(s) with the plan. Discharge instructions discussed at  great length. Strict return precautions discussed and pt &/or family have verbalized understanding of the instructions. No further questions at time of discharge.    Current Discharge Medication List     START taking these medications   Details  cephALEXin (KEFLEX) 500 MG capsule Take 1 capsule (500 mg total) by mouth 4 (four) times daily for 7 days. Qty: 28 capsule, Refills: 0    oxyCODONE-acetaminophen (PERCOCET/ROXICET) 5-325 MG tablet Take 1 tablet by mouth every 6 (six) hours as needed for severe pain. Qty: 15 tablet, Refills: 0        Follow Up: Leamon Arnt, [place] [id number] [phone]     [facility] [place] [facility] [address] [id number] [address] [place] [phone]       Nikkol Pai, Gwenyth Allegra, MD 01/22/22 [phone]

## 2022-01-22 NOTE — ED Notes (Signed)
Rx x 2 given  Written and verbal inst to pt  Verbalized an understanding  To home

## 2022-01-22 NOTE — Telephone Encounter (Signed)
Patient Name: Cassandra [name] Gender: Female DOB: August 14, 1960 Age: 61 Y 63 M 39 D Return Phone Number: [phone] (Primary), [phone] (Secondary) Address: City/ State/ Zip: [place]  [address] Client [facility] [place] [facility] [place] [facility] Contact Type Call Who Is Calling Patient / Member / Family / Caregiver Call Type Triage / Clinical Relationship To Patient Self Return Phone Number [phone] (Primary) Chief Complaint Walking difficulty Reason for Call Symptomatic / Request for [place] states that she is having trouble walking due to constricted muscles in her leg. Additional Comment She has pain in her legs and back. Translation No Nurse Assessment Nurse: Burnadette Peter, RN, Elmo Putt Date/Time (Eastern Time): 01/22/2022 1:11:06 PM Confirm and document reason for call. If symptomatic, describe symptoms. ---Caller states that she is having trouble walking due to constricted muscles in her leg. Caller states this has been going on all weekend long. Caller states she has pain in both of her legs. Caller states she feels it in the back of her legs down to her ankles and in between your ankles. Caller states she is able to move her legs but as soon as she pulls them up high or get out of the car it knocks her off her balance. Caller states when she stands she is bent over. Caller states she has weakness in her legs. Caller states she was put on Sodium Bicarbonate and after physical therapy she was getting dizzy and was told that its a side effect of the medication so she is not taking the medication now. Does the patient have any new or worsening symptoms? ---Yes Will a triage be completed? ---Yes Related visit to physician within the last 2 weeks? ---No Does the PT have any chronic conditions? (i.e. diabetes, asthma, this includes High risk factors for pregnancy,  etc.) ---Yes List chronic conditions. ---liver transplant patient, chronic right knee pain Is this a behavioral health or substance abuse call? ---No PLEASE NOTE: All timestamps contained within this report are represented as [place] Standard Time. CONFIDENTIALTY NOTICE: This fax transmission is intended only for the addressee. It contains information that is legally privileged, confidential or otherwise protected from use or disclosure. If you are not the intended recipient, you are strictly prohibited from reviewing, disclosing, copying using or disseminating any of this information or taking any action in reliance on or regarding this information. If you have received this fax in error, please notify us immediately by telephone so that we can arrange for its return to [place]. Phone: [phone], Toll-Free: [phone], Fax: [phone] Page: 2 of 2 Call Id: [id number] Guidelines Guideline Title Affirmed Question Affirmed Notes Nurse Date/Time Eilene Ghazi Time) Neurologic Deficit [1] SEVERE weakness (i.e., unable to walk or barely able to walk, requires support) AND [2] new-onset or worsening Wynona Canes 5/42/7062 3:76:28 PM Disp. Time Eilene Ghazi Time) Disposition Final User 01/22/2022 1:18:16 PM Call EMS 911 Now Yes Wynona Canes 08/17/1759 6:07:37 PM 911 Outcome Documentation Burnadette Peter, RN, Elmo Putt Reason: Unable to reach caller Final Disposition 01/22/2022 1:18:16 PM Call EMS 911 Now Yes Burnadette Peter, RN, Laren Everts Disagree/Comply Comply Caller Understands Yes PreDisposition InappropriateToAsk Care Advice Given Per Guideline CALL EMS 911 NOW: * Immediate medical attention is needed. You need to hang up and call 911 (or an ambulance). * Triager Discretion: I'll call you back in a few minutes to be sure you were able to reach them. CARE ADVICE given per Neurologic Deficit (Adult) guideline.

## 2022-01-22 NOTE — Telephone Encounter (Signed)
Patient states:  - She is experiencing constriction of muscles which has made it hard to walk and move.  - Symptoms began after she was instructed by [facility] to stop taking sodium bicarbonate - Pharmacy stated it was causing a "metabolic disruption" after she told them she was having muscle spasm and dizziness - Sodium bicarbonate was prescribed by [facility] to bring O2 levels up. She has been in contact with [facility]  -She was told by pharmacy that she needs potassium injection  I have transferred patient to triage.

## 2022-01-22 NOTE — Discharge Instructions (Signed)
Your history, exam, work-up today were overall reassuring but we did see evidence of possible urinary tract infection and given your frequency, given your immunosuppression.  I suspect some of your muscle and leg symptoms are related to the recent medication changes you made but your CO2 was improving and nearly normal for you.  Your other labs were also reassuring.  Ultrasound showed no evidence of blood clots and there was no evidence of heart failure on your work-up.  Your other work-up is reassuring at this time.  Clinically we feel you are safe for discharge home given your improved symptoms.  We had a discussion and agreed to give you Percocet without Tylenol given your tolerance of in the past and normal liver function tonight.  Please rest and stay hydrated and take the antibiotics and pain medicine.  If any symptoms change or worsen acutely, please return to the nearest emergency department.

## 2022-01-22 NOTE — ED Triage Notes (Signed)
Pt states started sodium bicarb tablets on 8/9 Reports generalized weakness and BL feet swelling since starting medication. States on 8/17 was at physical therapy and had near syncopal episode with balance issues.  Pt thinks she is having a reaction to the medication.

## 2022-01-24 ENCOUNTER — Ambulatory Visit: Payer: Medicare Other | Admitting: Family Medicine

## 2022-01-24 LAB — URINE CULTURE: Culture: <10000 — AB

## 2022-01-25 ENCOUNTER — Ambulatory Visit (INDEPENDENT_AMBULATORY_CARE_PROVIDER_SITE_OTHER): Payer: Medicare Other | Admitting: Internal Medicine

## 2022-01-25 ENCOUNTER — Encounter: Payer: Self-pay | Admitting: Internal Medicine

## 2022-01-25 VITALS — BP 112/88 | HR 99 | Temp 97.8°F | Resp 14 | Ht 64.0 in | Wt 186.0 lb

## 2022-01-25 DIAGNOSIS — R5383 Other fatigue: Secondary | ICD-10-CM

## 2022-01-25 DIAGNOSIS — R42 Dizziness and giddiness: Secondary | ICD-10-CM

## 2022-01-25 DIAGNOSIS — R270 Ataxia, unspecified: Secondary | ICD-10-CM

## 2022-01-25 DIAGNOSIS — D849 Immunodeficiency, unspecified: Secondary | ICD-10-CM | POA: Diagnosis not present

## 2022-01-25 DIAGNOSIS — M791 Myalgia, unspecified site: Secondary | ICD-10-CM | POA: Diagnosis not present

## 2022-01-25 DIAGNOSIS — E782 Mixed hyperlipidemia: Secondary | ICD-10-CM

## 2022-01-25 DIAGNOSIS — R519 Headache, unspecified: Secondary | ICD-10-CM | POA: Diagnosis not present

## 2022-01-25 DIAGNOSIS — Z944 Liver transplant status: Secondary | ICD-10-CM | POA: Diagnosis not present

## 2022-01-25 DIAGNOSIS — E119 Type 2 diabetes mellitus without complications: Secondary | ICD-10-CM | POA: Diagnosis not present

## 2022-01-25 DIAGNOSIS — R531 Weakness: Secondary | ICD-10-CM | POA: Diagnosis not present

## 2022-01-25 MED ORDER — METHYLPREDNISOLONE ACETATE 80 MG/ML IJ SUSP
80.0000 mg | Freq: Once | INTRAMUSCULAR | Status: AC
  Administered 2022-01-25: 80 mg via INTRAMUSCULAR

## 2022-01-25 NOTE — Patient Instructions (Signed)
It was a pleasure to meet you!  Return if you dont continue to improve Discuss lowering prednisone in future possibly with your transplant team If you get worse please do all the labs. Follow up with Dr. Jonni Sanger soon  Best of luck to you, Cassandra Pacas, MD 01/25/2022 9:21 AM

## 2022-01-25 NOTE — Progress Notes (Signed)
Cassandra [name] is a 61 y.o. female who presents today for an office visit. PCP:  Leamon Arnt, [place] Provider: [name], MD f  Assessment/Plan:  Overview: ER note and Labs from ER labs and imaging were reviewed - I agree with EM physician that the CO2 level better explains the weakness that was reported than any UTI... especially since there was no culture growth in urine culture.  Alternative sources of infection were considered since she is immunosuppressed.  Her HCO3 was 20- she notes that this is chronic for her but she is convinced the reason she has all these symptoms (myalgia/lightheaded,legs weak, mild headache) is all due to the bicarbonate and its getting better so all she wants today is steroid shot  Tabathia was seen today for follow-up.  Mixed hyperlipidemia  Other fatigue  Controlled type 2 diabetes mellitus without complication, without long-term current use of insulin (HCC) Overview: Neg urine a/c ratio 08/2021, not on ACE.    Myalgia  Lightheaded  Acute nonintractable headache, unspecified headache type  Weakness  Ataxia      Using [facility] clinical decision support with the patients verbal consent (no PHI entered) we developed the following differential and care plan and reviewed it:  Comprehensive Review of the Case: The patient is a 61 year old female with a history of liver transplant. She is currently on immunosuppressive medications including prograf, cellcept, and low dose prednisone. She has been experiencing severe pain all over her body for the past 8 days, with a particular focus on her hips and calves. She also reports an episode of lightheadedness and weakness. She denies having fevers but has had a mild headache for the past two days. She does not report any blurry vision. Her blood pressure has been elevated, in the 150s, which she attributes to her pain. She has had a negative complete blood count (CBC),  comprehensive metabolic panel (CMP), creatine kinase (CK), B-type natriuretic peptide (BNP), and urinalysis. There is no mention of any imaging data or other studies conducted.   Most Likely Dx:   a. Medication-induced myopathy: The patient's symptoms of diffuse muscle pain, particularly in the hips and calves, could be due to medication-induced myopathy, a known side effect of prednisone. The presence of elevated creatine kinase levels could further suggest this diagnosis, although in this case, the CK levels were normal.   Expanded DDx:  b. Fibromyalgia: This condition could explain the patient's widespread pain, lightheadedness, and mild headache. Fibromyalgia is often associated with other symptoms such as fatigue, sleep disturbances, and cognitive difficulties, which are not reported in this case. The presence of tender points on physical examination could further suggest this diagnosis.  c. Polymyalgia Rheumatica (PMR): PMR could explain the patient's symptoms of pain, particularly in the hip area, and general malaise. PMR typically affects individuals over the age of 42 and is often associated with morning stiffness, which is not reported in this case. The presence of elevated inflammatory markers such as erythrocyte sedimentation rate (ESR) and C-reactive protein (CRP) could further suggest this diagnosis.  Alternative DDx:  d. Hypothyroidism: Hypothyroidism could explain the patient's symptoms of generalized pain and weakness. The presence of other symptoms such as fatigue, weight gain, and cold intolerance could further suggest this diagnosis.  e. Vitamin D deficiency: Vitamin D deficiency could explain the patient's symptoms of diffuse muscle pain and weakness. The presence of low serum vitamin D levels could further suggest this diagnosis.  f. Chronic  Fatigue Syndrome (CFS): CFS could explain the patient's symptoms of generalized pain, lightheadedness, and mild headache. The presence of  severe fatigue and post-exertional malaise could further suggest this diagnosis.  g. Postural Orthostatic Tachycardia Syndrome (POTS): POTS could explain the patient's symptoms of lightheadedness and elevated blood pressure. The presence of a heart rate increase of more than 30 beats per minute within ten minutes of standing could further suggest this diagnosis.  h. Multiple Sclerosis (MS): MS could explain the patient's symptoms of generalized pain and weakness. The presence of other neurological symptoms such as numbness, tingling, or vision changes could further suggest this diagnosis.  i. Systemic Lupus Erythematosus (SLE): SLE could explain the patient's symptoms of generalized pain and mild headache. The presence of other symptoms such as rash, joint swelling, or kidney problems could further suggest this diagnosis.  j. Rheumatoid Arthritis (RA): RA could explain the patient's symptoms of generalized pain. The presence of joint swelling, stiffness, particularly in the morning, could further suggest this diagnosis.  #Severe Diffuse Body Pain in a Post-Liver Transplant Patient  The patient is a 61 year old female with a history of liver transplant who is currently on immunosuppressive medications. She presents with severe, diffuse body pain, particularly in her hips and calves, over the past 8 days. Accompanying symptoms include lightheadedness, weakness, and a mild headache. She has elevated blood pressure, which she attributes to her pain. Her laboratory studies, including CBC, CMP, CK, BNP, and urinalysis, are all within normal limits. The differential diagnosis includes medication-induced myopathy, fibromyalgia, polymyalgia rheumatica, hypothyroidism, vitamin D deficiency, chronic fatigue syndrome, postural orthostatic tachycardia syndrome, multiple sclerosis, systemic lupus erythematosus, and rheumatoid arthritis.  Dx - Further diagnostic studies to be ordered include:   - Thyroid function  tests to rule out hypothyroidism   - Serum vitamin D levels to rule out vitamin D deficiency   - Rheumatoid factor and anti-CCP antibodies to rule out rheumatoid arthritis   - ANA, anti-dsDNA, and anti-Smith antibodies to rule out systemic lupus erythematosus   - MRI of the brain to rule out multiple sclerosis   - Tilt table test to rule out postural orthostatic tachycardia syndrome   - Tender point examination to rule out fibromyalgia   - ESR and CRP to rule out polymyalgia rheumatica  Tx - The treatment plan for the most likely diagnosis, medication-induced myopathy, includes:   - Reducing the dose of prednisone, if possible, under the supervision of her transplant team   - Physical therapy to help manage pain and improve muscle strength   - Pain management with non-opioid analgesics such as acetaminophen or NSAIDs   - Regular follow-up appointments to monitor her symptoms and adjust treatment as necessary.   After reviewing all my clinical and the AI recommendations and me trying to offer additional labwork, MRI, tilt table, pain meds, phys therapy- she declined all but steroid injections but we will leave lab orders on in case she starts feeling worse.  She will stay off bicarb and we will blame that for now.    Subjective:  HPI:  Recent ER visit for muscle aches and weakness and leg swelling was reviewed and summary statement copied here: Work-up revealed evidence of possible UTI.  On reassessment she is reporting urinary frequency and would like to be treated with antibiotics.  Clinically I suspect her muscle ache and spasms may have been related to some of recent medication changes however patient agrees with antibiotics for the urine.  She requested some pain medicine which we  will provide and she reports she is taken Tylenol in the past despite her liver transplant.  Ultrasound of the legs did not show notes of DVT.  BNP normal.  Patient otherwise well-appearing.  Patient would like  to go home and given her otherwise reassuring work-up this is reasonable.  She understood return precautions and follow-up instructions. She no other questions or concerns and was discharged in good condition.    The reason for the extensive workup is that she is liver transplant patient on immunosuppressants    Final Clinical Impression(s) / ED Diagnoses Final diagnoses:  Acute cystitis without hematuria  Leg pain, bilateral  Myalgia       Interim Clinical History:  She reports the reason she went is she was in so much pain and weakness in legs she couldn't get out of car.  Also  had foot swelling. Only new med was na hco3 and this was attributed to that and it was stopped.  She reports she has remained in a lot of pain although thinks it may be easing up.  Pain has been constant last couple days.   She requests a steroid injection to relieve pain.    She denies any fevers at any point.  ER wanted to treat with antibiotics due to concerns about uti that culture is neg and asymptomate... really just wanted antibiotic since she is immunosuppressed.       Objective:  Physical Exam: BP 112/88 (BP Location: Right Arm)   Pulse 99   Temp 97.8 F (36.6 C) (Temporal)   Resp 14   Ht '5\' 4"'  (1.626 m)   Wt 186 lb (84.4 kg)   SpO2 98%   BMI 31.93 kg/m    Gen: No acute distress, resting comfortably Psych: Normal affect and thought content  Problem specific physical exam findings:  Very weak legs uses hands to stand antalgic ataxic gait without cane, no foot swelling or leg swelling appreciated.   Proximal muscle weakness. General mm tenderness   Recent Results (from the past 2160 hour(s))  CBC     Status: None   Collection Time: 01/22/22  2:47 PM  Result Value Ref Range   WBC 6.2 4.0 - 10.5 K/uL   RBC 4.40 3.87 - 5.11 MIL/uL   Hemoglobin 12.9 12.0 - 15.0 g/dL   HCT 36.9 36.0 - 46.0 %   MCV 83.9 80.0 - 100.0 fL   MCH 29.3 26.0 - 34.0 pg   MCHC 35.0 30.0 - 36.0 g/dL   RDW 13.0 11.5  - 15.5 %   Platelets 180 150 - 400 K/uL   nRBC 0.0 0.0 - 0.2 %    Comment: Performed at [facility], [address] [place], [place] [address]  Comprehensive metabolic panel     Status: Abnormal   Collection Time: 01/22/22  2:47 PM  Result Value Ref Range   Sodium 137 135 - 145 mmol/L   Potassium 3.9 3.5 - 5.1 mmol/L   Chloride 108 98 - 111 mmol/L   CO2 20 (L) 22 - 32 mmol/L   Glucose, Bld 173 (H) 70 - 99 mg/dL    Comment: Glucose reference range applies only to samples taken after fasting for at least 8 hours.   BUN 20 8 - 23 mg/dL   Creatinine, Ser 1.12 (H) 0.44 - 1.00 mg/dL   Calcium 9.3 8.9 - 10.3 mg/dL   Total Protein 7.1 6.5 - 8.1 g/dL   Albumin 3.8 3.5 - 5.0 g/dL  AST 33 15 - 41 U/L   ALT 26 0 - 44 U/L   Alkaline Phosphatase 81 38 - 126 U/L   Total Bilirubin 0.6 0.3 - 1.2 mg/dL   GFR, Estimated 56 (L) >60 mL/min    Comment: (NOTE) Calculated using the CKD-EPI Creatinine Equation (2021)    Anion gap 9 5 - 15    Comment: Performed at [facility], [address] [place], [place] [address]  Brain natriuretic peptide     Status: None   Collection Time: 01/22/22  2:47 PM  Result Value Ref Range   B Natriuretic Peptide 20.4 0.0 - 100.0 pg/mL    Comment: Performed at [facility], [address] [place], [place] [address]  CK     Status: None   Collection Time: 01/22/22  2:47 PM  Result Value Ref Range   Total CK 149 38 - 234 U/L    Comment: Performed at [facility], [address] [place], [place] [address]  Urinalysis, Routine w reflex microscopic Urine, Clean Catch     Status: Abnormal   Collection Time: 01/22/22  4:25 PM  Result Value Ref Range   Color, Urine YELLOW YELLOW   APPearance CLOUDY (A) CLEAR   Specific Gravity, Urine 1.015 1.005 - 1.030   pH 5.5 5.0 - 8.0   Glucose, UA NEGATIVE NEGATIVE mg/dL   Hgb urine dipstick SMALL (A) NEGATIVE   Bilirubin Urine NEGATIVE NEGATIVE   Ketones, ur NEGATIVE NEGATIVE mg/dL    Protein, ur NEGATIVE NEGATIVE mg/dL   Nitrite NEGATIVE NEGATIVE   Leukocytes,Ua LARGE (A) NEGATIVE    Comment: Performed at [facility], [address] [place], [place] [address]  Urinalysis, Microscopic (reflex)     Status: Abnormal   Collection Time: 01/22/22  4:25 PM  Result Value Ref Range   RBC / HPF 6-10 0 - 5 RBC/hpf   WBC, UA 21-50 0 - 5 WBC/hpf   Bacteria, UA FEW (A) NONE SEEN   Squamous Epithelial / LPF 6-10 0 - 5    Comment: Performed at [facility], [address] [place], [place] [address]  Urine Culture     Status: Abnormal   Collection Time: 01/22/22 11:19 PM   Specimen: Urine, Clean Catch  Result Value Ref Range   Specimen Description      URINE, CLEAN CATCH Performed at [facility], [address] [place], [place] [address]    Special Requests      NONE Performed at [facility], [address] [place], [place] [address]    Culture (A)     <10,000 COLONIES/mL INSIGNIFICANT GROWTH Performed at [facility], [place] [address], [place], [place] [address]    Report Status 01/24/2022 FINAL       [name], MD 01/25/2022 9:17 AM

## 2022-01-30 ENCOUNTER — Ambulatory Visit: Payer: Medicare Other | Admitting: Physical Therapy

## 2022-01-30 ENCOUNTER — Encounter: Payer: Self-pay | Admitting: Physical Therapy

## 2022-01-30 DIAGNOSIS — G8929 Other chronic pain: Secondary | ICD-10-CM

## 2022-01-30 DIAGNOSIS — M25561 Pain in right knee: Secondary | ICD-10-CM

## 2022-01-30 DIAGNOSIS — M25552 Pain in left hip: Secondary | ICD-10-CM

## 2022-01-30 DIAGNOSIS — M25551 Pain in right hip: Secondary | ICD-10-CM | POA: Diagnosis not present

## 2022-01-30 NOTE — Therapy (Signed)
OUTPATIENT PHYSICAL THERAPY LOWER EXTREMITY TREATMENT   Patient Name: Cassandra [name] MRN: [id number] DOB:1960/08/08, 61 y.o., female Today's Date: 01/30/2022   PT End of Session - 01/30/22 1016     Visit Number 2    Number of Visits 16    Date for PT Re-Evaluation 03/15/22    Authorization Type U[employer] Medicare    PT Start Time 1017    PT Stop Time 1058    PT Time Calculation (min) 41 min    Activity Tolerance Patient tolerated treatment well    Behavior During Therapy WFL for tasks assessed/performed             Past Medical History:  Diagnosis Date   Anxiety    AS (sickle cell trait) ([place]) 06/29/2019   Autoimmune hepatitis ([place])    Chronic renal insufficiency    Cirrhosis ([place])    COLONIC POLYPS, ADENOMATOUS, HX OF 05/05/2008   Qualifier: Diagnosis of  By: Harlon Ditty CMA (AAMA), [name]     Depression    External hemorrhoids    Fibromyalgia    GERD (gastroesophageal reflux disease)    Hyperlipidemia    Internal hemorrhoids    Iron deficiency anemia    Liver replaced by transplant ([place]) 02/22/2009   Qualifier: Diagnosis of  By: Chester Holstein NP, Nevin Bloodgood     Migraine    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    Ruptured disc, cervical    multiple levels   Statin intolerance 05/13/2018   Past Surgical History:  Procedure Laterality Date   ABDOMINAL HYSTERECTOMY  2010   KNEE ARTHROSCOPY Right 2010   LIVER TRANSPLANT  1992   ROTATOR CUFF REPAIR Left 2010   x2   Patient Active Problem List   Diagnosis Date Noted   Chronic kidney disease (CKD) stage G3a/A1, moderately decreased glomerular filtration rate (GFR) between 45-59 mL/min/1.73 square meter and albuminuria creatinine ratio less than 30 mg/g ([place]) 03/01/2021   AS (sickle cell trait) ([place]) 06/29/2019   Migraine with aura and without status migrainosus, not intractable 07/03/2018   Statin intolerance 05/13/2018   Cervical facet joint syndrome 03/28/2018   Spondylosis of cervical region without myelopathy or  radiculopathy 03/28/2018   Lumbar facet arthropathy 12/27/2017   Primary insomnia 11/04/2017   Immunosuppressed status ([place]) 10/07/2017   Adenomatous polyp of colon 09/28/2017   Cervical stenosis of spine 01/25/2017   Fibromyalgia 08/24/2013   Mixed hyperlipidemia 03/10/2013   Primary osteoarthritis of knees, bilateral 03/01/2013   Chronic back pain 10/13/2012   Hx of tuberculosis 10/13/2012   History of liver transplant ([place]) 02/22/2009   Anxiety disorder 09/16/2007    PCP: Billey Chang   REFERRING PROVIDER: [name]  REFERRING DIAG: bil hip pain, low back pain  THERAPY DIAG:  Pain in left hip  Pain in right hip  Chronic pain of right knee  Rationale for Evaluation and Treatment Rehabilitation  ONSET DATE:   SUBJECTIVE:   SUBJECTIVE STATEMENT:  Pt had complications last week, from Sodium bicarbonate- now has stopped taking. Was having weakness and pain. Feeling better now. Still taking anti-biotic.     Pt states Bil hip pain, Usually worse on the L. Feels like she cant stand up straight with initial standing.  Pain into groin on L. New pain, since June.  Kidney Transplant 31 Also has R knee pain /OA Feels a little dizzy when laying on mat table during Evaluation.  BP: 140/94.   108 pulse.   PERTINENT HISTORY: TIA- feb 23,   PAIN:  Are you having pain? Yes: NPRS scale: 8/10 Pain location: L hip Pain description: stiff, sore Aggravating factors: standing, walking, transfers Relieving factors: none stated   Are you having pain? Yes: NPRS scale: 5/10 Pain location: R hip Pain description: stiff, sore Aggravating factors: standing, transfers,  Relieving factors: none stated.   PRECAUTIONS: None  WEIGHT BEARING RESTRICTIONS No  FALLS:  Has patient fallen in last 6 months? No   PLOF: Independent  PATIENT GOALS   Decreased    OBJECTIVE:   DIAGNOSTIC FINDINGS:   COGNITION:  Overall cognitive status: Within functional limits for tasks  assessed      POSTURE: Standing: L hip higher, R knee valgus, possible scoliosis at t/l spine.   PALPATION:   LOWER EXTREMITY ROM:  Mild stiffness in bil hips, L >R. Knees: WFL  Lumbar: Mod limitation for flexion, extension, Mild/mod limitation for SB.  LOWER EXTREMITY MMT:  MMT Right eval Left eval  Hip flexion 4- 4-  Hip extension    Hip abduction 4- 4-  Hip adduction    Hip internal rotation    Hip external rotation    Knee flexion 4 4+  Knee extension 4 4+  Ankle dorsiflexion    Ankle plantarflexion    Ankle inversion    Ankle eversion     (Blank rows = not tested)  LOWER EXTREMITY SPECIAL TESTS:   GAIT Distance walked: 50 ft x2;  Assistive device utilized: None Level of assistance: Independent  Comments: posture:     TODAY'S TREATMENT: 01/30/22 Therapeutic Exercise: Aerobic: Supine: SLR x 10 bil; Clams GTB x 20; Bridging 2 x 10 bil;  Seated: Sit to stand x 10, higher mat, no UE support;  LAQ x 10 bil;  Standing:  Scap squeeze x 15;  Stretches: SKTC 30 sec x 2 bil;  Neuromuscular Re-education: Manual Therapy: long leg distraction x 2 min bil for lumbar and hip;  Therapeutic Activity: Self Care:    PATIENT EDUCATION:  Education details: Updated and reviewed HEP Person educated: Patient Education method: Explanation, Demonstration, Tactile cues, Verbal cues, and Handouts Education comprehension: verbalized understanding, returned demonstration, verbal cues required, tactile cues required, and needs further education   HOME EXERCISE PROGRAM: Access Code: [email or web address]   ASSESSMENT:  CLINICAL IMPRESSION: 01/30/2022 Pt with good tolerance for activity today. She will benefit from continued LE strength, postural strength, and progressing to standing strength/stability. Reviewed Initial HEP today.   Eval:Patient presents with primary complaint of increased pain in bil l>R hips, and R knee. Pt with decreased posture, with likely scoliosis and valgus of R  knee. Posture and weakness in LEs are effecting gait mechanics and ability. Pt with decreased ability for full functional activities and IADLs. Pt to benefit from skilled PT to improve.   OBJECTIVE IMPAIRMENTS Abnormal gait, decreased activity tolerance, decreased mobility, difficulty walking, decreased ROM, decreased strength, increased muscle spasms, impaired flexibility, improper body mechanics, and pain.   ACTIVITY LIMITATIONS carrying, lifting, bending, standing, squatting, stairs, transfers, and locomotion level  PARTICIPATION LIMITATIONS: meal prep, cleaning, laundry, shopping, and community activity  PERSONAL FACTORS  none  are also affecting patient's functional outcome.   REHAB POTENTIAL: Good  CLINICAL DECISION MAKING: Stable/uncomplicated  EVALUATION COMPLEXITY: Low   GOALS: Goals reviewed with patient? Yes  SHORT TERM GOALS: Target date: 02/01/2022   Pt to be independent with initial HEP  Goal status: INITIAL  2.  Pt to demo improved ability for sit to to stand, with ability for full upright posture with initial standing.  Goal status: INITIAL    LONG TERM GOALS: Target date: 03/15/2022   Pt to be independent with final HEP  Goal status: INITIAL  2.  Pt to report decreased pain in bil hips to 0-3/10 with standing activity and transfers.  :  Goal status: INITIAL  3.  Pt to demo improved strength of Bil hips and knees to at least 4+/5 to improve stability and pain   Goal status: INITIAL  4. Pt to demo improved upright posture and gait mechanics to be [facility] for pt age and dx, for improved abilty for exercise and walking up to 25 mi .   Goal status: INITIAL    PLAN: PT FREQUENCY: 2x/week  PT DURATION: 8 weeks  PLANNED INTERVENTIONS: Therapeutic exercises, Therapeutic activity, Neuromuscular re-education, Balance training, Gait training, Patient/Family education, Self Care, Joint mobilization, Joint manipulation, Stair training, DME instructions, Aquatic  Therapy, Dry Needling, Electrical stimulation, Spinal manipulation, Spinal mobilization, Cryotherapy, Moist heat, Taping, Vasopneumatic device, Traction, Ultrasound, Ionotophoresis '4mg'$ /ml Dexamethasone, and Manual therapy  PLAN FOR NEXT SESSION:   Lyndee Hensen, PT, DPT 1:57 PM  01/30/22

## 2022-02-01 ENCOUNTER — Encounter: Payer: Self-pay | Admitting: Physical Therapy

## 2022-02-01 ENCOUNTER — Ambulatory Visit: Payer: Medicare Other | Admitting: Physical Therapy

## 2022-02-01 DIAGNOSIS — M25551 Pain in right hip: Secondary | ICD-10-CM | POA: Diagnosis not present

## 2022-02-01 DIAGNOSIS — M25561 Pain in right knee: Secondary | ICD-10-CM

## 2022-02-01 DIAGNOSIS — M25552 Pain in left hip: Secondary | ICD-10-CM

## 2022-02-01 DIAGNOSIS — G8929 Other chronic pain: Secondary | ICD-10-CM | POA: Diagnosis not present

## 2022-02-01 NOTE — Therapy (Signed)
OUTPATIENT PHYSICAL THERAPY LOWER EXTREMITY TREATMENT   Patient Name: Cassandra [name] MRN: [id number] DOB:02-11-1961, 61 y.o., female Today's Date: 02/01/2022   PT End of Session - 02/01/22 1528     Visit Number 3    Number of Visits 16    Date for PT Re-Evaluation 03/15/22    Authorization Type [employer] Medicare    PT Start Time 1522    PT Stop Time 1600    PT Time Calculation (min) 38 min    Activity Tolerance Patient tolerated treatment well    Behavior During Therapy WFL for tasks assessed/performed              Past Medical History:  Diagnosis Date   Anxiety    AS (sickle cell trait) ([place]) 06/29/2019   Autoimmune hepatitis ([place])    Chronic renal insufficiency    Cirrhosis ([place])    COLONIC POLYPS, ADENOMATOUS, HX OF 05/05/2008   Qualifier: Diagnosis of  By: Harlon Ditty CMA (AAMA), [name]     Depression    External hemorrhoids    Fibromyalgia    GERD (gastroesophageal reflux disease)    Hyperlipidemia    Internal hemorrhoids    Iron deficiency anemia    Liver replaced by transplant ([place]) 02/22/2009   Qualifier: Diagnosis of  By: Chester Holstein NP, Nevin Bloodgood     Migraine    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    Ruptured disc, cervical    multiple levels   Statin intolerance 05/13/2018   Past Surgical History:  Procedure Laterality Date   ABDOMINAL HYSTERECTOMY  2010   KNEE ARTHROSCOPY Right 2010   LIVER TRANSPLANT  1992   ROTATOR CUFF REPAIR Left 2010   x2   Patient Active Problem List   Diagnosis Date Noted   Chronic kidney disease (CKD) stage G3a/A1, moderately decreased glomerular filtration rate (GFR) between 45-59 mL/min/1.73 square meter and albuminuria creatinine ratio less than 30 mg/g ([place]) 03/01/2021   AS (sickle cell trait) ([place]) 06/29/2019   Migraine with aura and without status migrainosus, not intractable 07/03/2018   Statin intolerance 05/13/2018   Cervical facet joint syndrome 03/28/2018   Spondylosis of cervical region without myelopathy or  radiculopathy 03/28/2018   Lumbar facet arthropathy 12/27/2017   Primary insomnia 11/04/2017   Immunosuppressed status ([place]) 10/07/2017   Adenomatous polyp of colon 09/28/2017   Cervical stenosis of spine 01/25/2017   Fibromyalgia 08/24/2013   Mixed hyperlipidemia 03/10/2013   Primary osteoarthritis of knees, bilateral 03/01/2013   Chronic back pain 10/13/2012   Hx of tuberculosis 10/13/2012   History of liver transplant ([place]) 02/22/2009   Anxiety disorder 09/16/2007    PCP: Billey Chang   REFERRING PROVIDER: [name]  REFERRING DIAG: bil hip pain, low back pain  THERAPY DIAG:  Pain in left hip  Pain in right hip  Chronic pain of right knee  Rationale for Evaluation and Treatment Rehabilitation  ONSET DATE:   SUBJECTIVE:   SUBJECTIVE STATEMENT:  Pt with no new complaints today     Pt states Bil hip pain, Usually worse on the L. Feels like she cant stand up straight with initial standing.  Pain into groin on L. New pain, since June.  Kidney Transplant 65 Also has R knee pain /OA Feels a little dizzy when laying on mat table during Evaluation.  BP: 140/94.   108 pulse.   PERTINENT HISTORY: TIA- feb 23,   PAIN:  Are you having pain? Yes: NPRS scale: 8/10 Pain location: L hip Pain description: stiff, sore  Aggravating factors: standing, walking, transfers Relieving factors: none stated   Are you having pain? Yes: NPRS scale: 5/10 Pain location: R hip Pain description: stiff, sore Aggravating factors: standing, transfers,  Relieving factors: none stated.   PRECAUTIONS: None  WEIGHT BEARING RESTRICTIONS No  FALLS:  Has patient fallen in last 6 months? No   PLOF: Independent  PATIENT GOALS   Decreased    OBJECTIVE:   DIAGNOSTIC FINDINGS:   COGNITION:  Overall cognitive status: Within functional limits for tasks assessed      POSTURE: Standing: L hip higher, R knee valgus, possible scoliosis at t/l spine.   PALPATION:   LOWER EXTREMITY  ROM:  Mild stiffness in bil hips, L >R. Knees: WFL  Lumbar: Mod limitation for flexion, extension, Mild/mod limitation for SB.  LOWER EXTREMITY MMT:  MMT Right eval Left eval  Hip flexion 4- 4-  Hip extension    Hip abduction 4- 4-  Hip adduction    Hip internal rotation    Hip external rotation    Knee flexion 4 4+  Knee extension 4 4+  Ankle dorsiflexion    Ankle plantarflexion    Ankle inversion    Ankle eversion     (Blank rows = not tested)  LOWER EXTREMITY SPECIAL TESTS:   GAIT Distance walked: 50 ft x2;  Assistive device utilized: None Level of assistance: Independent  Comments: posture:     TODAY'S TREATMENT:  02/01/22: Therapeutic Exercise: Aerobic: Recumbent bike L1 x 7 min;  Supine: SLR x 10 bil;  Bridging 2 x 10 bil;  Seated: Sit to stand x 10, regular chair, no UE support;  LAQ 3lb x 15 bil;  Standing:  Scap squeeze x 10; Rows GTB x 20; Marching x 20; hip abd 2 x 10 bil;  Stretches:   Neuromuscular Re-education: Manual Therapy:    01/30/22 Therapeutic Exercise: Aerobic: Supine: SLR x 10 bil; Clams GTB x 20; Bridging 2 x 10 bil;  Seated: Sit to stand x 10, higher mat, no UE support;  LAQ x 10 bil;  Standing:  Scap squeeze x 15;  Stretches: SKTC 30 sec x 2 bil;  Neuromuscular Re-education: Manual Therapy: long leg distraction x 2 min bil for lumbar and hip;  Therapeutic Activity: Self Care:    PATIENT EDUCATION:  Education details: Updated and reviewed HEP Person educated: Patient Education method: Explanation, Demonstration, Tactile cues, Verbal cues, and Handouts Education comprehension: verbalized understanding, returned demonstration, verbal cues required, tactile cues required, and needs further education   HOME EXERCISE PROGRAM: Access Code: [email or web address]   ASSESSMENT:  CLINICAL IMPRESSION: 02/01/2022 Pt challenged with exercises today. Noted weakness and instability in hips and core. Will benefit from progressive strengthening.    Eval:Patient presents with primary complaint of increased pain in bil l>R hips, and R knee. Pt with decreased posture, with likely scoliosis and valgus of R knee. Posture and weakness in LEs are effecting gait mechanics and ability. Pt with decreased ability for full functional activities and IADLs. Pt to benefit from skilled PT to improve.   OBJECTIVE IMPAIRMENTS Abnormal gait, decreased activity tolerance, decreased mobility, difficulty walking, decreased ROM, decreased strength, increased muscle spasms, impaired flexibility, improper body mechanics, and pain.   ACTIVITY LIMITATIONS carrying, lifting, bending, standing, squatting, stairs, transfers, and locomotion level  PARTICIPATION LIMITATIONS: meal prep, cleaning, laundry, shopping, and community activity  PERSONAL FACTORS  none  are also affecting patient's functional outcome.   REHAB POTENTIAL: Good  CLINICAL DECISION MAKING: Stable/uncomplicated  EVALUATION COMPLEXITY: Low  GOALS: Goals reviewed with patient? Yes  SHORT TERM GOALS: Target date: 02/01/2022   Pt to be independent with initial HEP  Goal status: INITIAL  2.  Pt to demo improved ability for sit to to stand, with ability for full upright posture with initial standing.   Goal status: INITIAL    LONG TERM GOALS: Target date: 03/15/2022   Pt to be independent with final HEP  Goal status: INITIAL  2.  Pt to report decreased pain in bil hips to 0-3/10 with standing activity and transfers.  :  Goal status: INITIAL  3.  Pt to demo improved strength of Bil hips and knees to at least 4+/5 to improve stability and pain   Goal status: INITIAL  4. Pt to demo improved upright posture and gait mechanics to be [facility] for pt age and dx, for improved abilty for exercise and walking up to 25 mi .   Goal status: INITIAL    PLAN: PT FREQUENCY: 2x/week  PT DURATION: 8 weeks  PLANNED INTERVENTIONS: Therapeutic exercises, Therapeutic activity, Neuromuscular  re-education, Balance training, Gait training, Patient/Family education, Self Care, Joint mobilization, Joint manipulation, Stair training, DME instructions, Aquatic Therapy, Dry Needling, Electrical stimulation, Spinal manipulation, Spinal mobilization, Cryotherapy, Moist heat, Taping, Vasopneumatic device, Traction, Ultrasound, Ionotophoresis '4mg'$ /ml Dexamethasone, and Manual therapy  PLAN FOR NEXT SESSION:   Lyndee Hensen, PT, DPT 4:08 PM  02/01/22

## 2022-02-07 ENCOUNTER — Encounter: Payer: Medicare Other | Admitting: Physical Therapy

## 2022-02-09 ENCOUNTER — Ambulatory Visit: Payer: Medicare Other | Admitting: Family Medicine

## 2022-02-12 ENCOUNTER — Ambulatory Visit: Payer: Medicare Other | Admitting: Physical Therapy

## 2022-02-12 ENCOUNTER — Encounter: Payer: Self-pay | Admitting: Physical Therapy

## 2022-02-12 DIAGNOSIS — M25561 Pain in right knee: Secondary | ICD-10-CM | POA: Diagnosis not present

## 2022-02-12 DIAGNOSIS — M25551 Pain in right hip: Secondary | ICD-10-CM | POA: Diagnosis not present

## 2022-02-12 DIAGNOSIS — G8929 Other chronic pain: Secondary | ICD-10-CM

## 2022-02-12 DIAGNOSIS — M25552 Pain in left hip: Secondary | ICD-10-CM

## 2022-02-12 NOTE — Therapy (Signed)
OUTPATIENT PHYSICAL THERAPY LOWER EXTREMITY TREATMENT   Patient Name: Cassandra [name] MRN: [id number] DOB:[birth year], 61 y.o., female Today's Date: 02/12/2022    PT End of Session - 02/12/22 1010     Visit Number 4    Number of Visits 16    Date for PT Re-Evaluation 03/15/22    Authorization Type [employer] Medicare    PT Start Time 1013    PT Stop Time 1057    PT Time Calculation (min) 44 min    Activity Tolerance Patient tolerated treatment well    Behavior During Therapy WFL for tasks assessed/performed               Past Medical History:  Diagnosis Date   Anxiety    AS (sickle cell trait) ([place]) 06/29/2019   Autoimmune hepatitis ([place])    Chronic renal insufficiency    Cirrhosis ([place])    COLONIC POLYPS, ADENOMATOUS, HX OF 05/05/2008   Qualifier: Diagnosis of  By: Harlon Ditty CMA (AAMA), [name]     Depression    External hemorrhoids    Fibromyalgia    GERD (gastroesophageal reflux disease)    Hyperlipidemia    Internal hemorrhoids    Iron deficiency anemia    Liver replaced by transplant ([place]) 02/22/2009   Qualifier: Diagnosis of  By: Chester Holstein NP, Nevin Bloodgood     Migraine    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    Ruptured disc, cervical    multiple levels   Statin intolerance 05/13/2018   Past Surgical History:  Procedure Laterality Date   ABDOMINAL HYSTERECTOMY  2010   KNEE ARTHROSCOPY Right 2010   LIVER TRANSPLANT  1992   ROTATOR CUFF REPAIR Left 2010   x2   Patient Active Problem List   Diagnosis Date Noted   Chronic kidney disease (CKD) stage G3a/A1, moderately decreased glomerular filtration rate (GFR) between 45-59 mL/min/1.73 square meter and albuminuria creatinine ratio less than 30 mg/g ([place]) 03/01/2021   AS (sickle cell trait) ([place]) 06/29/2019   Migraine with aura and without status migrainosus, not intractable 07/03/2018   Statin intolerance 05/13/2018   Cervical facet joint syndrome 03/28/2018   Spondylosis of cervical region without myelopathy or  radiculopathy 03/28/2018   Lumbar facet arthropathy 12/27/2017   Primary insomnia 11/04/2017   Immunosuppressed status ([place]) 10/07/2017   Adenomatous polyp of colon 09/28/2017   Cervical stenosis of spine 01/25/2017   Fibromyalgia 08/24/2013   Mixed hyperlipidemia 03/10/2013   Primary osteoarthritis of knees, bilateral 03/01/2013   Chronic back pain 10/13/2012   Hx of tuberculosis 10/13/2012   History of liver transplant ([place]) 02/22/2009   Anxiety disorder 09/16/2007    PCP: Billey Chang   REFERRING PROVIDER: [name]  REFERRING DIAG: bil hip pain, low back pain  THERAPY DIAG:  Pain in left hip  Pain in right hip  Chronic pain of right knee  Rationale for Evaluation and Treatment Rehabilitation  ONSET DATE:   SUBJECTIVE:   SUBJECTIVE STATEMENT:  Pt states soreness in R knee, and a bit more muscle soreness since last visit in hamstrings.     Pt states Bil hip pain, Usually worse on the L. Feels like she cant stand up straight with initial standing.  Pain into groin on L. New pain, since June.  Kidney Transplant 63 Also has R knee pain /OA Feels a little dizzy when laying on mat table during Evaluation.  BP: 140/94.   108 pulse.   PERTINENT HISTORY: TIA- feb 23,   PAIN:  Are you having  pain? Yes: NPRS scale: 8/10 Pain location: L hip Pain description: stiff, sore Aggravating factors: standing, walking, transfers Relieving factors: none stated   Are you having pain? Yes: NPRS scale: 5/10 Pain location: R hip Pain description: stiff, sore Aggravating factors: standing, transfers,  Relieving factors: none stated.   PRECAUTIONS: None  WEIGHT BEARING RESTRICTIONS No  FALLS:  Has patient fallen in last 6 months? No   PLOF: Independent  PATIENT GOALS   Decreased    OBJECTIVE:   DIAGNOSTIC FINDINGS:   COGNITION:  Overall cognitive status: Within functional limits for tasks assessed      POSTURE: Standing: L hip higher, R knee valgus, possible  scoliosis at t/l spine.   PALPATION:   LOWER EXTREMITY ROM:  Mild stiffness in bil hips, L >R. Knees: WFL  Lumbar: Mod limitation for flexion, extension, Mild/mod limitation for SB.  LOWER EXTREMITY MMT:  MMT Right eval Left eval  Hip flexion 4- 4-  Hip extension    Hip abduction 4- 4-  Hip adduction    Hip internal rotation    Hip external rotation    Knee flexion 4 4+  Knee extension 4 4+  Ankle dorsiflexion    Ankle plantarflexion    Ankle inversion    Ankle eversion     (Blank rows = not tested)  LOWER EXTREMITY SPECIAL TESTS:   GAIT Distance walked: 50 ft x2;  Assistive device utilized: None Level of assistance: Independent  Comments: posture:     TODAY'S TREATMENT:  02/12/2022  Therapeutic Exercise: Aerobic: Recumbent bike L1 x 6 min;  Supine: SLR 2 x 10 bil;  Bridging  x 10 bil;  S/L : hip abd 3x5 on L, 2x10 on R;  Clams x 10 bil;  Seated: Sit to stand x 10,  regular chair, no UE support;    Standing:  Rows GTB x 20;   Stretches:   Seated HSS 30 sec x 3;  Neuromuscular Re-education: Manual Therapy:    02/01/22: Therapeutic Exercise: Aerobic: Recumbent bike L1 x 7 min;  Supine: SLR x 10 bil;  Bridging 2 x 10 bil;  Seated: Sit to stand x 10, regular chair, no UE support;  LAQ 3lb x 15 bil;  Standing:  Scap squeeze x 10; Rows GTB x 20; Marching x 20; hip abd 2 x 10 bil;  Stretches:   Neuromuscular Re-education: Manual Therapy: STM with Roller to ITB, tennis ball to L glute.    01/30/22 Therapeutic Exercise: Aerobic: Supine: SLR x 10 bil; Clams GTB x 20; Bridging 2 x 10 bil;  Seated: Sit to stand x 10, higher mat, no UE support;  LAQ x 10 bil;  Standing:  Scap squeeze x 15;  Stretches: SKTC 30 sec x 2 bil;  Neuromuscular Re-education: Manual Therapy: long leg distraction x 2 min bil for lumbar and hip;  Therapeutic Activity: Self Care:    PATIENT EDUCATION:  Education details: reviewed HEP Person educated: Patient Education method:  Explanation, Demonstration, Tactile cues, Verbal cues, and Handouts Education comprehension: verbalized understanding, returned demonstration, verbal cues required, tactile cues required, and needs further education   HOME EXERCISE PROGRAM: Access Code: [email or web address]   ASSESSMENT:  CLINICAL IMPRESSION: 02/12/2022 Pt with improved ability for exercises today, with increased reps. Still challenged with strengthening , due to weakness. Very challenged with Bridging and SLR- Noted quad lag with SLR. Much tenderness in L gr troch and into ITB today. Pt to benefit from continued strength and manual as needed for pain.  Eval:Patient presents with primary complaint of increased pain in bil l>R hips, and R knee. Pt with decreased posture, with likely scoliosis and valgus of R knee. Posture and weakness in LEs are effecting gait mechanics and ability. Pt with decreased ability for full functional activities and IADLs. Pt to benefit from skilled PT to improve.   OBJECTIVE IMPAIRMENTS Abnormal gait, decreased activity tolerance, decreased mobility, difficulty walking, decreased ROM, decreased strength, increased muscle spasms, impaired flexibility, improper body mechanics, and pain.   ACTIVITY LIMITATIONS carrying, lifting, bending, standing, squatting, stairs, transfers, and locomotion level  PARTICIPATION LIMITATIONS: meal prep, cleaning, laundry, shopping, and community activity  PERSONAL FACTORS  none  are also affecting patient's functional outcome.   REHAB POTENTIAL: Good  CLINICAL DECISION MAKING: Stable/uncomplicated  EVALUATION COMPLEXITY: Low   GOALS: Goals reviewed with patient? Yes  SHORT TERM GOALS: Target date: 02/01/2022   Pt to be independent with initial HEP  Goal status: INITIAL  2.  Pt to demo improved ability for sit to to stand, with ability for full upright posture with initial standing.   Goal status: INITIAL    LONG TERM GOALS: Target date: 03/15/2022   Pt to  be independent with final HEP  Goal status: INITIAL  2.  Pt to report decreased pain in bil hips to 0-3/10 with standing activity and transfers.  :  Goal status: INITIAL  3.  Pt to demo improved strength of Bil hips and knees to at least 4+/5 to improve stability and pain   Goal status: INITIAL  4. Pt to demo improved upright posture and gait mechanics to be [facility] for pt age and dx, for improved abilty for exercise and walking up to 25 mi .   Goal status: INITIAL    PLAN: PT FREQUENCY: 2x/week  PT DURATION: 8 weeks  PLANNED INTERVENTIONS: Therapeutic exercises, Therapeutic activity, Neuromuscular re-education, Balance training, Gait training, Patient/Family education, Self Care, Joint mobilization, Joint manipulation, Stair training, DME instructions, Aquatic Therapy, Dry Needling, Electrical stimulation, Spinal manipulation, Spinal mobilization, Cryotherapy, Moist heat, Taping, Vasopneumatic device, Traction, Ultrasound, Ionotophoresis '4mg'$ /ml Dexamethasone, and Manual therapy  PLAN FOR NEXT SESSION:   Lyndee Hensen, PT, DPT 10:11 AM  02/12/22

## 2022-02-14 ENCOUNTER — Encounter: Payer: Self-pay | Admitting: Physical Therapy

## 2022-02-14 ENCOUNTER — Encounter: Payer: Self-pay | Admitting: Family Medicine

## 2022-02-14 ENCOUNTER — Ambulatory Visit: Payer: Medicare Other | Admitting: Physical Therapy

## 2022-02-14 ENCOUNTER — Ambulatory Visit (INDEPENDENT_AMBULATORY_CARE_PROVIDER_SITE_OTHER): Payer: Medicare Other | Admitting: Family Medicine

## 2022-02-14 VITALS — BP 132/82 | HR 90 | Temp 98.0°F | Ht 64.0 in | Wt 183.0 lb

## 2022-02-14 DIAGNOSIS — M170 Bilateral primary osteoarthritis of knee: Secondary | ICD-10-CM | POA: Diagnosis not present

## 2022-02-14 DIAGNOSIS — G8929 Other chronic pain: Secondary | ICD-10-CM | POA: Diagnosis not present

## 2022-02-14 DIAGNOSIS — M25552 Pain in left hip: Secondary | ICD-10-CM

## 2022-02-14 DIAGNOSIS — M25551 Pain in right hip: Secondary | ICD-10-CM

## 2022-02-14 DIAGNOSIS — E119 Type 2 diabetes mellitus without complications: Secondary | ICD-10-CM | POA: Diagnosis not present

## 2022-02-14 DIAGNOSIS — M160 Bilateral primary osteoarthritis of hip: Secondary | ICD-10-CM | POA: Diagnosis not present

## 2022-02-14 DIAGNOSIS — M25561 Pain in right knee: Secondary | ICD-10-CM | POA: Diagnosis not present

## 2022-02-14 DIAGNOSIS — M797 Fibromyalgia: Secondary | ICD-10-CM

## 2022-02-14 LAB — POCT GLYCOSYLATED HEMOGLOBIN (HGB A1C): Hemoglobin A1C: 5.9 % — AB (ref 4.0–5.6)

## 2022-02-14 MED ORDER — OXYCODONE-ACETAMINOPHEN 5-325 MG PO TABS: 1.0000 | ORAL_TABLET | Freq: Two times a day (BID) | ORAL | 0 refills | Status: DC | PRN

## 2022-02-14 NOTE — Progress Notes (Signed)
Subjective  CC: pain and diet controlled diabetes  HPI: Cassandra [name] is a 61 y.o. female who presents to the office today for follow up of diabetes and problems listed above in the chief complaint.  Diabetes follow up: Her diabetic control is reported as Unchanged. She no longer drinks mountain dew so sugars have been controlled.  She denies exertional CP or SOB or symptomatic hypoglycemia. She denies foot sores or paresthesias.  Pain: I reviewed notes from Dr. Dennard Nip visit and ED visit.  Has persistent left greater than right leg pain.  This is been ongoing for several months now.  Worsens intermittently.  No clear pattern.  Pain seems to be stemming from the left hip and right hip.  I reviewed x-ray showing bilateral DJD.  She is undergoing physical therapy per sports medicine recommendations.  She has no true radicular symptoms.  At times she admits to AM stiffness.  No fevers or chills.  Occasionally right upper shoulder pain exists with known rotator cuff arthropathy.  No bilateral symptoms.  No history of PMR.  She has history of chronic pain due to lumbar and cervical DJD that has been stable for many years.  Intermittently will use Percocet.  We will try to avoid NSAIDs.  She does have low vitamin D and says she is taking a supplement.  CK has been consistently normal.  She was given a steroid shot a few weeks ago that did not significantly improve her pain. Fibromyalgia: She does report that her leg pain is different than her typical fibromyalgia pain.  She has failed Cymbalta, Lyrica and gabapentin.  She reports her sleep is currently pretty good.  She has used trazodone in the past for sleep.  High stress levels given some home situations and recent family illnesses/death.  However she feels she is coping well.  Wt Readings from Last 3 Encounters:  02/14/22 183 lb (83 kg)  01/25/22 186 lb (84.4 kg)  01/22/22 178 lb (80.7 kg)    BP Readings from Last 3 Encounters:  02/14/22  132/82  01/25/22 112/88  01/22/22 (!) 151/99    Assessment  1. Controlled type 2 diabetes mellitus without complication, without long-term current use of insulin ([place])   2. Bilateral hip joint arthritis   3. Primary osteoarthritis of knees, bilateral   4. Fibromyalgia      Plan  Diabetes is currently very well controlled.  Continue healthy diet. Hip pain and leg pain: Suspect mostly from hip arthritis but also has significant bilateral knee arthritis.  Follow-up with sports medicine.  Complete physical therapy.  Rare intermittent use of Percocet, refilled #10 today.  May warrant referral to orthopedics if cannot get under control.  Education counseling done.  May benefit from steroid injections for trochanteric bursitis. Fibromyalgia: Likely contributing.  Education given. Vitamin D deficiency: Recommend supplements.  Follow up: 3 months for complete physical. Orders Placed This Encounter  Procedures   POCT HgB A1C   Meds ordered this encounter  Medications   oxyCODONE-acetaminophen (PERCOCET/ROXICET) 5-325 MG tablet    Sig: Take 1 tablet by mouth 2 (two) times daily as needed for up to 5 days for severe pain.    Dispense:  10 tablet    Refill:  0      Immunization History  Administered Date(s) Administered   Hepatitis A, Adult 09/27/2016, 09/26/2017   Influenza Split 03/05/2012   Influenza, Quadrivalent, Recombinant, Inj, Pf 04/29/2013, 03/28/2016   Influenza, Seasonal, Injecte, Preservative Fre 03/02/2014, 02/28/2015  Influenza,inj,Quad PF,6+ Mos 03/28/2016   Influenza-Unspecified 05/26/2013, 03/02/2014, 02/28/2015, 03/28/2016   PFIZER Comirnaty(Gray Top)Covid-19 Tri-Sucrose Vaccine 08/27/2019, 09/17/2019, 07/19/2020   PFIZER(Purple Top)SARS-COV-2 Vaccination 08/27/2019, 09/17/2019   PNEUMOCOCCAL CONJUGATE-20 09/07/2021   Pneumococcal Conjugate-13 09/22/2015   Pneumococcal Polysaccharide-23 09/27/2016   Tdap 05/17/2008, 12/03/2010   Tetanus 05/04/2020    Diabetes  Related Lab Review: Lab Results  Component Value Date   HGBA1C 5.9 (A) 02/14/2022   HGBA1C 6.1 (A) 08/28/2021   HGBA1C 6.5 02/28/2021    Lab Results  Component Value Date   MICROALBUR 0.9 08/28/2021   Lab Results  Component Value Date   CREATININE 1.12 (H) 01/22/2022   BUN 20 01/22/2022   NA 137 01/22/2022   K 3.9 01/22/2022   CL 108 01/22/2022   CO2 20 (L) 01/22/2022   Lab Results  Component Value Date   CHOL 275 (H) 02/28/2021   CHOL 273 (H) 02/22/2020   CHOL 312 (H) 01/21/2019   Lab Results  Component Value Date   HDL 58.40 02/28/2021   HDL 46 (L) 02/22/2020   HDL 52.00 01/21/2019   Lab Results  Component Value Date   LDLCALC 187 (H) 02/28/2021   LDLCALC 189 (H) 02/22/2020   LDLCALC 78 12/13/2011   Lab Results  Component Value Date   TRIG 148.0 02/28/2021   TRIG 203 (H) 02/22/2020   TRIG 289.0 (H) 01/21/2019   Lab Results  Component Value Date   CHOLHDL 5 02/28/2021   CHOLHDL 5.9 (H) 02/22/2020   CHOLHDL 6 01/21/2019   Lab Results  Component Value Date   LDLDIRECT 212.0 01/21/2019   LDLDIRECT 114.1 10/28/2009   The ASCVD Risk score (Arnett DK, et al., 2019) failed to calculate for the following reasons:   Unable to determine if patient is Non-Hispanic African American I have reviewed the PMH, Fam and Soc history. Patient Active Problem List   Diagnosis Date Noted   Diet-controlled type 2 diabetes mellitus ([place]) 08/28/2021    Priority: High    Neg urine a/c ratio 08/2021, not on ACE.     Chronic kidney disease (CKD) stage G3a/A1, moderately decreased glomerular filtration rate (GFR) between 45-59 mL/min/1.73 square meter and albuminuria creatinine ratio less than 30 mg/g ([facility]) 03/01/2021    Priority: High   Statin intolerance 05/13/2018    Priority: High   Primary insomnia 11/04/2017    Priority: High   Immunosuppressed status ([place]) 10/07/2017    Priority: High   Adenomatous polyp of colon 09/28/2017    Priority: High    Per [facility]  found in 2009; followed up in 2011.    Mixed hyperlipidemia 03/10/2013    Priority: High   Primary osteoarthritis of knees, bilateral 03/01/2013    Priority: High    Dr. Michaelle Birks saw in 08/2016 for pain and was considering Synvisc.  High risk for total knee due to immune suppressant post liver transplant. [facility] 2019: severe OA right knee with recurrent effusions. S/p synvisc w/o relief. Holding off on replacement.     Chronic back pain 10/13/2012    Priority: High   History of liver transplant ([place]) 02/22/2009    Priority: High    [facility]:due to AIH with cirrhosis.  1992.     Migraine with aura and without status migrainosus, not intractable 07/03/2018    Priority: Medium    Cervical stenosis of spine 01/25/2017    Priority: Medium    Fibromyalgia 08/24/2013    Priority: Medium    Hx of tuberculosis 10/13/2012  Priority: Medium    Anxiety disorder 09/16/2007    Priority: Medium     Has failed prozac, elavil, zoloft in past.  Used nightly xanax since 2000; weaned 2019.     AS (sickle cell trait) ([place]) 06/29/2019    Priority: Low   Cervical facet joint syndrome 03/28/2018   Spondylosis of cervical region without myelopathy or radiculopathy 03/28/2018   Lumbar facet arthropathy 12/27/2017    Social History: Patient  reports that she has never smoked. She has never used smokeless tobacco. She reports that she does not drink alcohol and does not use drugs.  Review of Systems: Ophthalmic: negative for eye pain, loss of vision or double vision Cardiovascular: negative for chest pain Respiratory: negative for SOB or persistent cough Gastrointestinal: negative for abdominal pain Genitourinary: negative for dysuria or gross hematuria MSK: negative for foot lesions Neurologic: negative for weakness or gait disturbance  Objective  Vitals: BP 132/82   Pulse 90   Temp 98 F (36.7 C)   Ht '5\' 4"'$  (1.626 m)   Wt 183 lb (83 kg)   SpO2 99%   BMI 31.41 kg/m  General: well  appearing, no acute distress  Psych:  Alert and oriented, normal mood and affect HEENT:  Normocephalic, atraumatic, moist mucous membranes, supple neck  Cardiovascular:  Nl S1 and S2, RRR without murmur, gallop or rub. no edema Respiratory:  Good breath sounds bilaterally, CTAB with normal effort, no rales Neurologic:   Mental status is normal. normal gait without limp     Diabetic education: ongoing education regarding chronic disease management for diabetes was given today. We continue to reinforce the ABC's of diabetic management: A1c (<7 or 8 dependent upon patient), tight blood pressure control, and cholesterol management with goal LDL < 100 minimally. We discuss diet strategies, exercise recommendations, medication options and possible side effects. At each visit, we review recommended immunizations and preventive care recommendations for diabetics and stress that good diabetic control can prevent other problems. See below for this patient's data.   Commons side effects, risks, benefits, and alternatives for medications and treatment plan prescribed today were discussed, and the patient expressed understanding of the given instructions. Patient is instructed to call or message via MyChart if he/she has any questions or concerns regarding our treatment plan. No barriers to understanding were identified. We discussed Red Flag symptoms and signs in detail. Patient expressed understanding regarding what to do in case of urgent or emergency type symptoms.  Medication list was reconciled, printed and provided to the patient in AVS. Patient instructions and summary information was reviewed with the patient as documented in the AVS. This note was prepared with assistance of Dragon voice recognition software. Occasional wrong-word or sound-a-like substitutions may have occurred due to the inherent limitations of voice recognition software  This visit occurred during the SARS-CoV-2 public health  emergency.  Safety protocols were in place, including screening questions prior to the visit, additional usage of staff PPE, and extensive cleaning of exam room while observing appropriate contact time as indicated for disinfecting solutions.

## 2022-02-14 NOTE — Therapy (Signed)
OUTPATIENT PHYSICAL THERAPY LOWER EXTREMITY TREATMENT   Patient Name: Cassandra [name] MRN: [id number] DOB:Dec 23, 1960, 61 y.o., female Today's Date: 02/14/2022    PT End of Session - 02/14/22 1025     Visit Number 5    Number of Visits 16    Date for PT Re-Evaluation 03/15/22    Authorization Type [employer] Medicare    PT Start Time 1020    PT Stop Time 1100    PT Time Calculation (min) 40 min    Activity Tolerance Patient tolerated treatment well    Behavior During Therapy WFL for tasks assessed/performed                Past Medical History:  Diagnosis Date   Anxiety    AS (sickle cell trait) ([place]) 06/29/2019   Autoimmune hepatitis ([place])    Chronic renal insufficiency    Cirrhosis ([place])    COLONIC POLYPS, ADENOMATOUS, HX OF 05/05/2008   Qualifier: Diagnosis of  By: Harlon Ditty CMA (AAMA), [name]     Depression    External hemorrhoids    Fibromyalgia    GERD (gastroesophageal reflux disease)    Hyperlipidemia    Internal hemorrhoids    Iron deficiency anemia    Liver replaced by transplant ([place]) 02/22/2009   Qualifier: Diagnosis of  By: Chester Holstein NP, Nevin Bloodgood     Migraine    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    Ruptured disc, cervical    multiple levels   Statin intolerance 05/13/2018   Past Surgical History:  Procedure Laterality Date   ABDOMINAL HYSTERECTOMY  2010   KNEE ARTHROSCOPY Right 2010   LIVER TRANSPLANT  1992   ROTATOR CUFF REPAIR Left 2010   x2   Patient Active Problem List   Diagnosis Date Noted   Diet-controlled type 2 diabetes mellitus ([place]) 08/28/2021   Chronic kidney disease (CKD) stage G3a/A1, moderately decreased glomerular filtration rate (GFR) between 45-59 mL/min/1.73 square meter and albuminuria creatinine ratio less than 30 mg/g ([place]) 03/01/2021   AS (sickle cell trait) ([place]) 06/29/2019   Migraine with aura and without status migrainosus, not intractable 07/03/2018   Statin intolerance 05/13/2018   Cervical facet joint syndrome  03/28/2018   Spondylosis of cervical region without myelopathy or radiculopathy 03/28/2018   Lumbar facet arthropathy 12/27/2017   Primary insomnia 11/04/2017   Immunosuppressed status ([place]) 10/07/2017   Adenomatous polyp of colon 09/28/2017   Cervical stenosis of spine 01/25/2017   Fibromyalgia 08/24/2013   Mixed hyperlipidemia 03/10/2013   Primary osteoarthritis of knees, bilateral 03/01/2013   Chronic back pain 10/13/2012   Hx of tuberculosis 10/13/2012   History of liver transplant ([place]) 02/22/2009   Anxiety disorder 09/16/2007    PCP: Billey Chang   REFERRING PROVIDER: [name]  REFERRING DIAG: bil hip pain, low back pain  THERAPY DIAG:  Pain in left hip  Pain in right hip  Chronic pain of right knee  Rationale for Evaluation and Treatment Rehabilitation  ONSET DATE:   SUBJECTIVE:   SUBJECTIVE STATEMENT:  Pt states soreness in L hip. Has had more pain this week.     Pt states Bil hip pain, Usually worse on the L. Feels like she cant stand up straight with initial standing.  Pain into groin on L. New pain, since June.  Kidney Transplant 19 Also has R knee pain /OA Feels a little dizzy when laying on mat table during Evaluation.  BP: 140/94.   108 pulse.   PERTINENT HISTORY: TIA- feb 23,  PAIN:  Are you having pain? Yes: NPRS scale: 8/10 Pain location: L hip Pain description: stiff, sore Aggravating factors: standing, walking, transfers Relieving factors: none stated   Are you having pain? Yes: NPRS scale: 5/10 Pain location: R hip Pain description: stiff, sore Aggravating factors: standing, transfers,  Relieving factors: none stated.   PRECAUTIONS: None  WEIGHT BEARING RESTRICTIONS No  FALLS:  Has patient fallen in last 6 months? No   PLOF: Independent  PATIENT GOALS   Decreased    OBJECTIVE:   DIAGNOSTIC FINDINGS:   COGNITION:  Overall cognitive status: Within functional limits for tasks assessed      POSTURE: Standing: L hip  higher, R knee valgus, possible scoliosis at t/l spine.   PALPATION:   LOWER EXTREMITY ROM:  Mild stiffness in bil hips, L >R. Knees: WFL  Lumbar: Mod limitation for flexion, extension, Mild/mod limitation for SB.  LOWER EXTREMITY MMT:  MMT Right eval Left eval  Hip flexion 4- 4-  Hip extension    Hip abduction 4- 4-  Hip adduction    Hip internal rotation    Hip external rotation    Knee flexion 4 4+  Knee extension 4 4+  Ankle dorsiflexion    Ankle plantarflexion    Ankle inversion    Ankle eversion     (Blank rows = not tested)  LOWER EXTREMITY SPECIAL TESTS:   GAIT Distance walked: 50 ft x2;  Assistive device utilized: None Level of assistance: Independent  Comments: posture:     TODAY'S TREATMENT:  02/14/22: Therapeutic Exercise: Aerobic: Recumbent bike L2 x 3 min;(cramp in R quad)  Supine: SLR 2 x 10 bil;  Bridging  2 x 10 bil;  S/L : hip abd 3x5 on L,   Clams 2 x 10 bil;  Seated: Sit to stand x 10,  regular chair, no UE support;    Standing:  Scap squeeze x 10;  Rows GTB x 20;  Hip abd 2x10 bil; Slow march with focus on posture and hip control x 20; Staggered stance weight shift for hip control x 10 bil;  Stretches:    Neuromuscular Re-education: Manual Therapy:    02/12/2022 Therapeutic Exercise: Aerobic: Recumbent bike L1 x 6 min;  Supine: SLR 2 x 10 bil;  Bridging  x 10 bil;  S/L : hip abd 3x5 on L, 2x10 on R;  Clams x 10 bil;  Seated: Sit to stand x 10,  regular chair, no UE support;    Standing:  Rows GTB x 20;   Stretches:   Seated HSS 30 sec x 3;  Neuromuscular Re-education: Manual Therapy:    02/01/22: Therapeutic Exercise: Aerobic: Recumbent bike L1 x 7 min;  Supine: SLR x 10 bil;  Bridging 2 x 10 bil;  Seated: Sit to stand x 10, regular chair, no UE support;  LAQ 3lb x 15 bil;  Standing:  Scap squeeze x 10; Rows GTB x 20; Marching x 20; hip abd 2 x 10 bil;  Stretches:   Neuromuscular Re-education: Manual Therapy: STM with Roller to  ITB, tennis ball to L glute.     PATIENT EDUCATION:  Education details: reviewed HEP Person educated: Patient Education method: Explanation, Demonstration, Tactile cues, Verbal cues, and Handouts Education comprehension: verbalized understanding, returned demonstration, verbal cues required, tactile cues required, and needs further education   HOME EXERCISE PROGRAM: Access Code: [email or web address]   ASSESSMENT:  CLINICAL IMPRESSION: 02/14/2022 Pt with noted instability and weakness in L hip complex. Challenged with ther ex and  cued for most exercises for form and attention to hip stability. Pt with weakness in core and hips that are effecting gait pattern. Will benefit from progressive strengthening for hips and core. She has repeated episodes of pain in R shoulder today- assessed: Unsure of mechanism, but seems to have significant spasm of deltoid with certain motions with R shoulder, when she tries to elevate arm, as well as with scapular retraction exercise today, Noted lump and spasm in upper deltoid region, with increased pain, somewhat decreased with position change of shoulder, to more neutral position, recommended she see sports med for further assessment.    Pt with improved ability for exercises today, with increased reps. Still challenged with strengthening , due to weakness. Very challenged with Bridging and SLR- Noted quad lag with SLR. Much tenderness in L gr troch and into ITB today. Pt to benefit from continued strength and manual as needed for pain.    Eval:Patient presents with primary complaint of increased pain in bil l>R hips, and R knee. Pt with decreased posture, with likely scoliosis and valgus of R knee. Posture and weakness in LEs are effecting gait mechanics and ability. Pt with decreased ability for full functional activities and IADLs. Pt to benefit from skilled PT to improve.   OBJECTIVE IMPAIRMENTS Abnormal gait, decreased activity tolerance, decreased mobility,  difficulty walking, decreased ROM, decreased strength, increased muscle spasms, impaired flexibility, improper body mechanics, and pain.   ACTIVITY LIMITATIONS carrying, lifting, bending, standing, squatting, stairs, transfers, and locomotion level  PARTICIPATION LIMITATIONS: meal prep, cleaning, laundry, shopping, and community activity  PERSONAL FACTORS  none  are also affecting patient's functional outcome.   REHAB POTENTIAL: Good  CLINICAL DECISION MAKING: Stable/uncomplicated  EVALUATION COMPLEXITY: Low   GOALS: Goals reviewed with patient? Yes  SHORT TERM GOALS: Target date: 02/01/2022   Pt to be independent with initial HEP  Goal status: INITIAL  2.  Pt to demo improved ability for sit to to stand, with ability for full upright posture with initial standing.   Goal status: INITIAL    LONG TERM GOALS: Target date: 03/15/2022   Pt to be independent with final HEP  Goal status: INITIAL  2.  Pt to report decreased pain in bil hips to 0-3/10 with standing activity and transfers.  :  Goal status: INITIAL  3.  Pt to demo improved strength of Bil hips and knees to at least 4+/5 to improve stability and pain   Goal status: INITIAL  4. Pt to demo improved upright posture and gait mechanics to be [facility] for pt age and dx, for improved abilty for exercise and walking up to 25 mi .   Goal status: INITIAL    PLAN: PT FREQUENCY: 2x/week  PT DURATION: 8 weeks  PLANNED INTERVENTIONS: Therapeutic exercises, Therapeutic activity, Neuromuscular re-education, Balance training, Gait training, Patient/Family education, Self Care, Joint mobilization, Joint manipulation, Stair training, DME instructions, Aquatic Therapy, Dry Needling, Electrical stimulation, Spinal manipulation, Spinal mobilization, Cryotherapy, Moist heat, Taping, Vasopneumatic device, Traction, Ultrasound, Ionotophoresis '4mg'$ /ml Dexamethasone, and Manual therapy  PLAN FOR NEXT SESSION:   Lyndee Hensen, PT,  DPT 12:20 PM  02/14/22

## 2022-02-14 NOTE — Patient Instructions (Signed)
Please return in 3 mo for cpe  If you have any questions or concerns, please don't hesitate to send me a message via MyChart or call the office at [phone]. Thank you for visiting with [place] today! It's our pleasure caring for you.   See Dr. Amalia Hailey, I believe much of your pain is coming from your hips.

## 2022-02-19 ENCOUNTER — Encounter: Payer: Self-pay | Admitting: Physical Therapy

## 2022-02-19 ENCOUNTER — Ambulatory Visit: Payer: Medicare Other | Admitting: Physical Therapy

## 2022-02-19 DIAGNOSIS — M25551 Pain in right hip: Secondary | ICD-10-CM

## 2022-02-19 DIAGNOSIS — M25552 Pain in left hip: Secondary | ICD-10-CM | POA: Diagnosis not present

## 2022-02-19 DIAGNOSIS — G8929 Other chronic pain: Secondary | ICD-10-CM

## 2022-02-19 DIAGNOSIS — M25561 Pain in right knee: Secondary | ICD-10-CM | POA: Diagnosis not present

## 2022-02-19 NOTE — Therapy (Signed)
OUTPATIENT PHYSICAL THERAPY LOWER EXTREMITY TREATMENT   Patient Name: Cassandra [name] MRN: [id number] DOB:03/18/1961, 61 y.o., female Today's Date: 02/19/2022    PT End of Session - 02/19/22 1025     Visit Number 6    Number of Visits 16    Date for PT Re-Evaluation 03/15/22    Authorization Type [employer] Medicare    PT Start Time 1020    PT Stop Time 1100    PT Time Calculation (min) 40 min    Activity Tolerance Patient tolerated treatment well    Behavior During Therapy WFL for tasks assessed/performed                 Past Medical History:  Diagnosis Date   Anxiety    AS (sickle cell trait) ([place]) 06/29/2019   Autoimmune hepatitis ([place])    Chronic renal insufficiency    Cirrhosis ([place])    COLONIC POLYPS, ADENOMATOUS, HX OF 05/05/2008   Qualifier: Diagnosis of  By: Harlon Ditty CMA (AAMA), [name]     Depression    External hemorrhoids    Fibromyalgia    GERD (gastroesophageal reflux disease)    Hyperlipidemia    Internal hemorrhoids    Iron deficiency anemia    Liver replaced by transplant ([place]) 02/22/2009   Qualifier: Diagnosis of  By: Chester Holstein NP, Nevin Bloodgood     Migraine    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    Ruptured disc, cervical    multiple levels   Statin intolerance 05/13/2018   Past Surgical History:  Procedure Laterality Date   ABDOMINAL HYSTERECTOMY  2010   KNEE ARTHROSCOPY Right 2010   LIVER TRANSPLANT  1992   ROTATOR CUFF REPAIR Left 2010   x2   Patient Active Problem List   Diagnosis Date Noted   Diet-controlled type 2 diabetes mellitus ([place]) 08/28/2021   Chronic kidney disease (CKD) stage G3a/A1, moderately decreased glomerular filtration rate (GFR) between 45-59 mL/min/1.73 square meter and albuminuria creatinine ratio less than 30 mg/g ([place]) 03/01/2021   AS (sickle cell trait) ([place]) 06/29/2019   Migraine with aura and without status migrainosus, not intractable 07/03/2018   Statin intolerance 05/13/2018   Cervical facet joint syndrome  03/28/2018   Spondylosis of cervical region without myelopathy or radiculopathy 03/28/2018   Lumbar facet arthropathy 12/27/2017   Primary insomnia 11/04/2017   Immunosuppressed status ([place]) 10/07/2017   Adenomatous polyp of colon 09/28/2017   Cervical stenosis of spine 01/25/2017   Fibromyalgia 08/24/2013   Mixed hyperlipidemia 03/10/2013   Primary osteoarthritis of knees, bilateral 03/01/2013   Chronic back pain 10/13/2012   Hx of tuberculosis 10/13/2012   History of liver transplant ([place]) 02/22/2009   Anxiety disorder 09/16/2007    PCP: Billey Chang   REFERRING PROVIDER: [name]  REFERRING DIAG: bil hip pain, low back pain  THERAPY DIAG:  Pain in left hip  Pain in right hip  Chronic pain of right knee  Rationale for Evaluation and Treatment Rehabilitation  ONSET DATE:   SUBJECTIVE:   SUBJECTIVE STATEMENT:  Pt had massage last week. States increased soreness in L knee, distal/lateral quad. States she had to stay in the bed most of the weekend, due to pain.     Pt states Bil hip pain, Usually worse on the L. Feels like she cant stand up straight with initial standing.  Pain into groin on L. New pain, since June.  Kidney Transplant 61 Also has R knee pain /OA Feels a little dizzy when laying on mat table during  Evaluation.  BP: 140/94.   108 pulse.   PERTINENT HISTORY: TIA- feb 23,   PAIN:  Are you having pain? Yes: NPRS scale: 8/10 Pain location: L hip Pain description: stiff, sore Aggravating factors: standing, walking, transfers Relieving factors: none stated   Are you having pain? Yes: NPRS scale: 5/10 Pain location: R hip Pain description: stiff, sore Aggravating factors: standing, transfers,  Relieving factors: none stated.   PRECAUTIONS: None  WEIGHT BEARING RESTRICTIONS No  FALLS:  Has patient fallen in last 6 months? No   PLOF: Independent  PATIENT GOALS   Decreased    OBJECTIVE:   DIAGNOSTIC FINDINGS:   COGNITION:  Overall  cognitive status: Within functional limits for tasks assessed      POSTURE: Standing: L hip higher, R knee valgus, possible scoliosis at t/l spine.   PALPATION:   LOWER EXTREMITY ROM:  Mild stiffness in bil hips, L >R. Knees: WFL  Lumbar: Mod limitation for flexion, extension, Mild/mod limitation for SB.  LOWER EXTREMITY MMT:  MMT Right eval Left eval  Hip flexion 4- 4-  Hip extension    Hip abduction 4- 4-  Hip adduction    Hip internal rotation    Hip external rotation    Knee flexion 4 4+  Knee extension 4 4+  Ankle dorsiflexion    Ankle plantarflexion    Ankle inversion    Ankle eversion     (Blank rows = not tested)  LOWER EXTREMITY SPECIAL TESTS:   GAIT Distance walked: 50 ft x2;  Assistive device utilized: None Level of assistance: Independent  Comments: posture:     TODAY'S TREATMENT:  02/19/22: Therapeutic Exercise: Aerobic: Recumbent bike L2 x  6 min; Supine: SLR 3x5 on L; 2x10 on R;   Supine march with TA x 15; Clams GTB x 20 with TA;  Bridging  x 10 bil;  TA with education on activation x 20, 3 sec holds;  S/L : hip abd 3x5 on L,   Clams 2 x 10 bil;  Seated: Sit to stand x 10, higher mat table for knee pain: LAQ x 10 bil;    Standing:      Stretches:    Neuromuscular Re-education: Manual Therapy:     02/14/22: Therapeutic Exercise: Aerobic: Recumbent bike L2 x 3 min;(cramp in R quad)  Supine: SLR 2 x 10 bil;  Bridging  2 x 10 bil;  S/L : hip abd 3x5 on L,   Clams 2 x 10 bil;  Seated: Sit to stand x 10,  regular chair, no UE support;    Standing:  Scap squeeze x 10;  Rows GTB x 20;  Hip abd 2x10 bil; Slow march with focus on posture and hip control x 20; Staggered stance weight shift for hip control x 10 bil;  Stretches:    Neuromuscular Re-education: Manual Therapy:    02/12/2022 Therapeutic Exercise: Aerobic: Recumbent bike L1 x 6 min;  Supine: SLR 2 x 10 bil;  Bridging  x 10 bil;  S/L : hip abd 3x5 on L, 2x10 on R;  Clams x 10 bil;   Seated: Sit to stand x 10,  regular chair, no UE support;    Standing:  Rows GTB x 20;   Stretches:   Seated HSS 30 sec x 3;  Neuromuscular Re-education: Manual Therapy:     PATIENT EDUCATION:  Education details: reviewed HEP Person educated: Patient Education method: Explanation, Media planner, Corporate treasurer cues, Verbal cues, and Handouts Education comprehension: verbalized understanding, returned demonstration, verbal  cues required, tactile cues required, and needs further education   HOME EXERCISE PROGRAM: Access Code: [email or web address]   ASSESSMENT:  CLINICAL IMPRESSION:  02/19/2022  Pt with increased pain in L knee and hip today. Difficulty with LAQ and TKE due to weakness in quad.  Much difficulty with SLR on L- unable to do without quad lag due to weakness. Focus today for strength in L LE. Will continue to benefit from continued strength for improving pain and gait mechanics.    Eval:Patient presents with primary complaint of increased pain in bil l>R hips, and R knee. Pt with decreased posture, with likely scoliosis and valgus of R knee. Posture and weakness in LEs are effecting gait mechanics and ability. Pt with decreased ability for full functional activities and IADLs. Pt to benefit from skilled PT to improve.   OBJECTIVE IMPAIRMENTS Abnormal gait, decreased activity tolerance, decreased mobility, difficulty walking, decreased ROM, decreased strength, increased muscle spasms, impaired flexibility, improper body mechanics, and pain.   ACTIVITY LIMITATIONS carrying, lifting, bending, standing, squatting, stairs, transfers, and locomotion level  PARTICIPATION LIMITATIONS: meal prep, cleaning, laundry, shopping, and community activity  PERSONAL FACTORS  none  are also affecting patient's functional outcome.   REHAB POTENTIAL: Good  CLINICAL DECISION MAKING: Stable/uncomplicated  EVALUATION COMPLEXITY: Low   GOALS: Goals reviewed with patient? Yes  SHORT TERM GOALS: Target  date: 02/01/2022   Pt to be independent with initial HEP  Goal status: INITIAL  2.  Pt to demo improved ability for sit to to stand, with ability for full upright posture with initial standing.   Goal status: INITIAL    LONG TERM GOALS: Target date: 03/15/2022   Pt to be independent with final HEP  Goal status: INITIAL  2.  Pt to report decreased pain in bil hips to 0-3/10 with standing activity and transfers.  :  Goal status: INITIAL  3.  Pt to demo improved strength of Bil hips and knees to at least 4+/5 to improve stability and pain   Goal status: INITIAL  4. Pt to demo improved upright posture and gait mechanics to be [facility] for pt age and dx, for improved abilty for exercise and walking up to 25 mi .   Goal status: INITIAL    PLAN: PT FREQUENCY: 2x/week  PT DURATION: 8 weeks  PLANNED INTERVENTIONS: Therapeutic exercises, Therapeutic activity, Neuromuscular re-education, Balance training, Gait training, Patient/Family education, Self Care, Joint mobilization, Joint manipulation, Stair training, DME instructions, Aquatic Therapy, Dry Needling, Electrical stimulation, Spinal manipulation, Spinal mobilization, Cryotherapy, Moist heat, Taping, Vasopneumatic device, Traction, Ultrasound, Ionotophoresis '4mg'$ /ml Dexamethasone, and Manual therapy  PLAN FOR NEXT SESSION:   Lyndee Hensen, PT, DPT 11:14 AM  02/19/22

## 2022-02-21 ENCOUNTER — Encounter: Payer: Self-pay | Admitting: Physical Therapy

## 2022-02-21 ENCOUNTER — Ambulatory Visit: Payer: Medicare Other | Admitting: Physical Therapy

## 2022-02-21 DIAGNOSIS — M25551 Pain in right hip: Secondary | ICD-10-CM | POA: Diagnosis not present

## 2022-02-21 DIAGNOSIS — M25552 Pain in left hip: Secondary | ICD-10-CM

## 2022-02-21 DIAGNOSIS — M25561 Pain in right knee: Secondary | ICD-10-CM | POA: Diagnosis not present

## 2022-02-21 DIAGNOSIS — G8929 Other chronic pain: Secondary | ICD-10-CM | POA: Diagnosis not present

## 2022-02-21 NOTE — Therapy (Signed)
OUTPATIENT PHYSICAL THERAPY LOWER EXTREMITY TREATMENT   Patient Name: Cassandra [name] MRN: [id number] DOB:September 23, 1960, 61 y.o., female Today's Date: 02/21/2022    PT End of Session - 02/21/22 1039     Visit Number 7    Number of Visits 16    Date for PT Re-Evaluation 03/15/22    Authorization Type UHC Medicare    PT Start Time 74    PT Stop Time 1100    PT Time Calculation (min) 39 min    Activity Tolerance Patient tolerated treatment well    Behavior During Therapy WFL for tasks assessed/performed                  Past Medical History:  Diagnosis Date   Anxiety    AS (sickle cell trait) ([place]) 06/29/2019   Autoimmune hepatitis ([place])    Chronic renal insufficiency    Cirrhosis ([place])    COLONIC POLYPS, ADENOMATOUS, HX OF 05/05/2008   Qualifier: Diagnosis of  By: Harlon Ditty CMA (AAMA), [name]     Depression    External hemorrhoids    Fibromyalgia    GERD (gastroesophageal reflux disease)    Hyperlipidemia    Internal hemorrhoids    Iron deficiency anemia    Liver replaced by transplant ([place]) 02/22/2009   Qualifier: Diagnosis of  By: Chester Holstein NP, Nevin Bloodgood     Migraine    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    Ruptured disc, cervical    multiple levels   Statin intolerance 05/13/2018   Past Surgical History:  Procedure Laterality Date   ABDOMINAL HYSTERECTOMY  2010   KNEE ARTHROSCOPY Right 2010   LIVER TRANSPLANT  1992   ROTATOR CUFF REPAIR Left 2010   x2   Patient Active Problem List   Diagnosis Date Noted   Diet-controlled type 2 diabetes mellitus ([place]) 08/28/2021   Chronic kidney disease (CKD) stage G3a/A1, moderately decreased glomerular filtration rate (GFR) between 45-59 mL/min/1.73 square meter and albuminuria creatinine ratio less than 30 mg/g ([place]) 03/01/2021   AS (sickle cell trait) ([place]) 06/29/2019   Migraine with aura and without status migrainosus, not intractable 07/03/2018   Statin intolerance 05/13/2018   Cervical facet joint syndrome  03/28/2018   Spondylosis of cervical region without myelopathy or radiculopathy 03/28/2018   Lumbar facet arthropathy 12/27/2017   Primary insomnia 11/04/2017   Immunosuppressed status ([place]) 10/07/2017   Adenomatous polyp of colon 09/28/2017   Cervical stenosis of spine 01/25/2017   Fibromyalgia 08/24/2013   Mixed hyperlipidemia 03/10/2013   Primary osteoarthritis of knees, bilateral 03/01/2013   Chronic back pain 10/13/2012   Hx of tuberculosis 10/13/2012   History of liver transplant ([place]) 02/22/2009   Anxiety disorder 09/16/2007    PCP: Billey Chang   REFERRING PROVIDER: [name]  REFERRING DIAG: bil hip pain, low back pain  THERAPY DIAG:  Pain in left hip  Pain in right hip  Chronic pain of right knee  Rationale for Evaluation and Treatment Rehabilitation  ONSET DATE:   SUBJECTIVE:   SUBJECTIVE STATEMENT:  Pt had fall yesterday, trying to go up step at her daughters house. States no injury.     Pt states Bil hip pain, Usually worse on the L. Feels like she cant stand up straight with initial standing.  Pain into groin on L. New pain, since June.  Kidney Transplant 28 Also has R knee pain /OA Feels a little dizzy when laying on mat table during Evaluation.  BP: 140/94.   108 pulse.   PERTINENT  HISTORY: TIA- feb 23,   PAIN:  Are you having pain? Yes: NPRS scale: 8/10 Pain location: L hip Pain description: stiff, sore Aggravating factors: standing, walking, transfers Relieving factors: none stated   Are you having pain? Yes: NPRS scale: 5/10 Pain location: R hip Pain description: stiff, sore Aggravating factors: standing, transfers,  Relieving factors: none stated.   PRECAUTIONS: None  WEIGHT BEARING RESTRICTIONS No  FALLS:  Has patient fallen in last 6 months? No   PLOF: Independent  PATIENT GOALS   Decreased    OBJECTIVE:   DIAGNOSTIC FINDINGS:   COGNITION:  Overall cognitive status: Within functional limits for tasks  assessed      POSTURE: Standing: L hip higher, R knee valgus, possible scoliosis at t/l spine.   PALPATION:   LOWER EXTREMITY ROM:  Mild stiffness in bil hips, L >R. Knees: WFL  Lumbar: Mod limitation for flexion, extension, Mild/mod limitation for SB.  LOWER EXTREMITY MMT:  MMT Right eval Left eval  Hip flexion 4- 4-  Hip extension    Hip abduction 4- 4-  Hip adduction    Hip internal rotation    Hip external rotation    Knee flexion 4 4+  Knee extension 4 4+  Ankle dorsiflexion    Ankle plantarflexion    Ankle inversion    Ankle eversion     (Blank rows = not tested)  LOWER EXTREMITY SPECIAL TESTS:   GAIT Distance walked: 50 ft x2;  Assistive device utilized: None Level of assistance: Independent  Comments: posture:     TODAY'S TREATMENT:  02/21/22: Therapeutic Exercise: Aerobic: Recumbent bike L2 x  6 min; Supine: SLR 3x5 on L (with assist ) ,   2x10 on R;   Bridging  2 x 10 bil;  SAQ x 15 on L;  S/L : hip abd 3x5 on L,    Seated: Sit to stand x 10, higher mat table for knee pain:  LAQ 2 x10 bil;    Standing:    Pre-gait: Staggered stance weight shifts a/p for stability and upright posture in L hip/trunk; fwd/bwd weight shifting with stepping a/p x 10 bil;  Slow march x 20;  Stretches:    Neuromuscular Re-education: Manual Therapy:     02/19/22: Therapeutic Exercise: Aerobic: Recumbent bike L2 x  6 min; Supine: SLR 3x5 on L; 2x10 on R;   Supine march with TA x 15; Clams GTB x 20 with TA;  Bridging  x 10 bil;  TA with education on activation x 20, 3 sec holds;  S/L : hip abd 3x5 on L,   Clams 2 x 10 bil;  Seated: Sit to stand x 10, higher mat table for knee pain: LAQ x 10 bil;    Standing:      Stretches:    Neuromuscular Re-education: Manual Therapy:     02/14/22: Therapeutic Exercise: Aerobic: Recumbent bike L2 x 3 min;(cramp in R quad)  Supine: SLR 2 x 10 bil;  Bridging  2 x 10 bil;  S/L : hip abd 3x5 on L,   Clams 2 x 10 bil;  Seated: Sit  to stand x 10,  regular chair, no UE support;    Standing:  Scap squeeze x 10;  Rows GTB x 20;  Hip abd 2x10 bil; Slow march with focus on posture and hip control x 20; Staggered stance weight shift for hip control x 10 bil;  Stretches:    Neuromuscular Re-education: Manual Therapy:    02/12/2022 Therapeutic Exercise: Aerobic:  Recumbent bike L1 x 6 min;  Supine: SLR 2 x 10 bil;  Bridging  x 10 bil;  S/L : hip abd 3x5 on L, 2x10 on R;  Clams x 10 bil;  Seated: Sit to stand x 10,  regular chair, no UE support;    Standing:  Rows GTB x 20;   Stretches:   Seated HSS 30 sec x 3;  Neuromuscular Re-education: Manual Therapy:     PATIENT EDUCATION:  Education details: reviewed HEP Person educated: Patient Education method: Consulting civil engineer, Demonstration, Tactile cues, Verbal cues, and Handouts Education comprehension: verbalized understanding, returned demonstration, verbal cues required, tactile cues required, and needs further education   HOME EXERCISE PROGRAM: Access Code: [email or web address]   ASSESSMENT:  CLINICAL IMPRESSION:  02/21/2022  Pt with good tolerance for activity today. Noted hip instability and weakness with loading on L side, and with SLS phase of gait. She has improved gait mechanics with less trunk lean with cuing and pt focus on this today. Discussed trying to practice mechanics with walking at home. Will continue to benefit from strengthening for L LE , balance, and gait mechanics.   Eval:Patient presents with primary complaint of increased pain in bil l>R hips, and R knee. Pt with decreased posture, with likely scoliosis and valgus of R knee. Posture and weakness in LEs are effecting gait mechanics and ability. Pt with decreased ability for full functional activities and IADLs. Pt to benefit from skilled PT to improve.   OBJECTIVE IMPAIRMENTS Abnormal gait, decreased activity tolerance, decreased mobility, difficulty walking, decreased ROM, decreased strength, increased muscle  spasms, impaired flexibility, improper body mechanics, and pain.   ACTIVITY LIMITATIONS carrying, lifting, bending, standing, squatting, stairs, transfers, and locomotion level  PARTICIPATION LIMITATIONS: meal prep, cleaning, laundry, shopping, and community activity  PERSONAL FACTORS  none  are also affecting patient's functional outcome.   REHAB POTENTIAL: Good  CLINICAL DECISION MAKING: Stable/uncomplicated  EVALUATION COMPLEXITY: Low   GOALS: Goals reviewed with patient? Yes  SHORT TERM GOALS: Target date: 02/01/2022   Pt to be independent with initial HEP  Goal status: INITIAL  2.  Pt to demo improved ability for sit to to stand, with ability for full upright posture with initial standing.   Goal status: INITIAL    LONG TERM GOALS: Target date: 03/15/2022   Pt to be independent with final HEP  Goal status: INITIAL  2.  Pt to report decreased pain in bil hips to 0-3/10 with standing activity and transfers.  :  Goal status: INITIAL  3.  Pt to demo improved strength of Bil hips and knees to at least 4+/5 to improve stability and pain   Goal status: INITIAL  4. Pt to demo improved upright posture and gait mechanics to be [facility] for pt age and dx, for improved abilty for exercise and walking up to 25 mi .   Goal status: INITIAL    PLAN: PT FREQUENCY: 2x/week  PT DURATION: 8 weeks  PLANNED INTERVENTIONS: Therapeutic exercises, Therapeutic activity, Neuromuscular re-education, Balance training, Gait training, Patient/Family education, Self Care, Joint mobilization, Joint manipulation, Stair training, DME instructions, Aquatic Therapy, Dry Needling, Electrical stimulation, Spinal manipulation, Spinal mobilization, Cryotherapy, Moist heat, Taping, Vasopneumatic device, Traction, Ultrasound, Ionotophoresis '4mg'$ /ml Dexamethasone, and Manual therapy  PLAN FOR NEXT SESSION:   Lyndee Hensen, PT, DPT 10:39 AM  02/21/22

## 2022-02-22 NOTE — Therapy (Signed)
OUTPATIENT PHYSICAL THERAPY LOWER EXTREMITY TREATMENT   Patient Name: Cassandra [name] MRN: [id number] DOB:09-18-1960, 61 y.o., female Today's Date: 02/26/2022    PT End of Session - 02/26/22 1326     Visit Number 8    Number of Visits 16    Date for PT Re-Evaluation 03/15/22    Authorization Type UHC Medicare    PT Start Time 66    PT Stop Time 1145    PT Time Calculation (min) 43 min    Activity Tolerance Patient tolerated treatment well    Behavior During Therapy WFL for tasks assessed/performed             Past Medical History:  Diagnosis Date   Anxiety    AS (sickle cell trait) ([place]) 06/29/2019   Autoimmune hepatitis ([place])    Chronic renal insufficiency    Cirrhosis ([place])    COLONIC POLYPS, ADENOMATOUS, HX OF 05/05/2008   Qualifier: Diagnosis of  By: Harlon Ditty CMA (AAMA), [name]     Depression    External hemorrhoids    Fibromyalgia    GERD (gastroesophageal reflux disease)    Hyperlipidemia    Internal hemorrhoids    Iron deficiency anemia    Liver replaced by transplant ([place]) 02/22/2009   Qualifier: Diagnosis of  By: Chester Holstein NP, Nevin Bloodgood     Migraine    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    Ruptured disc, cervical    multiple levels   Statin intolerance 05/13/2018   Past Surgical History:  Procedure Laterality Date   ABDOMINAL HYSTERECTOMY  2010   KNEE ARTHROSCOPY Right 2010   LIVER TRANSPLANT  1992   ROTATOR CUFF REPAIR Left 2010   x2   Patient Active Problem List   Diagnosis Date Noted   Diet-controlled type 2 diabetes mellitus ([place]) 08/28/2021   Chronic kidney disease (CKD) stage G3a/A1, moderately decreased glomerular filtration rate (GFR) between 45-59 mL/min/1.73 square meter and albuminuria creatinine ratio less than 30 mg/g ([place]) 03/01/2021   AS (sickle cell trait) ([place]) 06/29/2019   Migraine with aura and without status migrainosus, not intractable 07/03/2018   Statin intolerance 05/13/2018   Cervical facet joint syndrome 03/28/2018    Spondylosis of cervical region without myelopathy or radiculopathy 03/28/2018   Lumbar facet arthropathy 12/27/2017   Primary insomnia 11/04/2017   Immunosuppressed status ([place]) 10/07/2017   Adenomatous polyp of colon 09/28/2017   Cervical stenosis of spine 01/25/2017   Fibromyalgia 08/24/2013   Mixed hyperlipidemia 03/10/2013   Primary osteoarthritis of knees, bilateral 03/01/2013   Chronic back pain 10/13/2012   Hx of tuberculosis 10/13/2012   History of liver transplant ([place]) 02/22/2009   Anxiety disorder 09/16/2007    PCP: Billey Chang   REFERRING PROVIDER: [name]  REFERRING DIAG: bil hip pain, low back pain  THERAPY DIAG:  Pain in left hip  Pain in right hip  Chronic pain of right knee  Rationale for Evaluation and Treatment Rehabilitation  ONSET DATE:   SUBJECTIVE:   SUBJECTIVE STATEMENT: Pt reports getting a cortisone shot in her L hip which has helped a lot.    Pt states Bil hip pain, Usually worse on the L. Feels like she cant stand up straight with initial standing.  Pain into groin on L. New pain, since June.  Kidney Transplant 67 Also has R knee pain /OA Feels a little dizzy when laying on mat table during Evaluation.  BP: 140/94.   108 pulse.   PERTINENT HISTORY: TIA- feb 23,   PAIN:  Are you having pain? Yes: NPRS scale: 8/10 Pain location: L hip Pain description: stiff, sore Aggravating factors: standing, walking, transfers Relieving factors: none stated   Are you having pain? Yes: NPRS scale: 5/10 Pain location: R hip Pain description: stiff, sore Aggravating factors: standing, transfers,  Relieving factors: none stated.   PRECAUTIONS: None  WEIGHT BEARING RESTRICTIONS No  FALLS:  Has patient fallen in last 6 months? No   PLOF: Independent  PATIENT GOALS   Decreased    OBJECTIVE:   DIAGNOSTIC FINDINGS:   COGNITION:  Overall cognitive status: Within functional limits for tasks assessed      POSTURE: Standing: L hip  higher, R knee valgus, possible scoliosis at t/l spine.   PALPATION:   LOWER EXTREMITY ROM:  Mild stiffness in bil hips, L >R. Knees: WFL  Lumbar: Mod limitation for flexion, extension, Mild/mod limitation for SB.  LOWER EXTREMITY MMT:  MMT Right eval Left eval  Hip flexion 4- 4-  Hip extension    Hip abduction 4- 4-  Hip adduction    Hip internal rotation    Hip external rotation    Knee flexion 4 4+  Knee extension 4 4+  Ankle dorsiflexion    Ankle plantarflexion    Ankle inversion    Ankle eversion     (Blank rows = not tested)  LOWER EXTREMITY SPECIAL TESTS:   GAIT Distance walked: 50 ft x2;  Assistive device utilized: None Level of assistance: Independent  Comments: posture:   TODAY'S TREATMENT: 02/26/2022:  Therapeutic Exercise: Aerobic: Recumbent bike L2 x  6 min; Supine: SLR 3x5 on L (extensor lag noted, but without assist today),  2x10 on R;  Bridging  2 x 10 bil;  SAQ x 15 on L;  S/L : hip abd 3x5 on L,    Seated: Sit to stand x 10, higher mat table for knee pain:  LAQ 2 x10 bil;    Standing:  Pre-gait: Staggered stance weight shifts a/p for stability and upright posture in L hip/trunk; fwd/bwd weight shifting with stepping a/p x 10 bil;  Slow march x 20;  Stretches:    Neuromuscular Re-education: Manual Therapy:   02/21/22: Therapeutic Exercise: Aerobic: Recumbent bike L2 x  6 min; Supine: SLR 3x5 on L (with assist ) ,   2x10 on R;   Bridging  2 x 10 bil;  SAQ x 15 on L;  S/L : hip abd 3x5 on L,    Seated: Sit to stand x 10, higher mat table for knee pain:  LAQ 2 x10 bil;    Standing:    Pre-gait: Staggered stance weight shifts a/p for stability and upright posture in L hip/trunk; fwd/bwd weight shifting with stepping a/p x 10 bil;  Slow march x 20;  Stretches:    Neuromuscular Re-education: Manual Therapy:   02/19/22: Therapeutic Exercise: Aerobic: Recumbent bike L2 x  6 min; Supine: SLR 3x5 on L; 2x10 on R;   Supine march with TA x 15;  Clams GTB x 20 with TA;  Bridging  x 10 bil;  TA with education on activation x 20, 3 sec holds;  S/L : hip abd 3x5 on L,   Clams 2 x 10 bil;  Seated: Sit to stand x 10, higher mat table for knee pain: LAQ x 10 bil;    Standing:      Stretches:    Neuromuscular Re-education: Manual Therapy:    PATIENT EDUCATION:  Education details: reviewed HEP Person educated: Patient Education method:  Explanation, Demonstration, Tactile cues, Verbal cues, and Handouts Education comprehension: verbalized understanding, returned demonstration, verbal cues required, tactile cues required, and needs further education   HOME EXERCISE PROGRAM: Access Code: [email or web address]   ASSESSMENT:  CLINICAL IMPRESSION:  02/26/2022 Pt with good tolerance for activity today. She was able to perform a SLR on the L side without assistance today, but demonstrates an extensor lag. Pt reports no pain with exercises today. She states that the shot has been helpful with her functional movements. She continues to demonstrate improved gait mechanics. Will continue to benefit from strengthening for L LE , balance, and gait mechanics.   Eval:Patient presents with primary complaint of increased pain in bil l>R hips, and R knee. Pt with decreased posture, with likely scoliosis and valgus of R knee. Posture and weakness in LEs are effecting gait mechanics and ability. Pt with decreased ability for full functional activities and IADLs. Pt to benefit from skilled PT to improve.   OBJECTIVE IMPAIRMENTS Abnormal gait, decreased activity tolerance, decreased mobility, difficulty walking, decreased ROM, decreased strength, increased muscle spasms, impaired flexibility, improper body mechanics, and pain.   ACTIVITY LIMITATIONS carrying, lifting, bending, standing, squatting, stairs, transfers, and locomotion level  PARTICIPATION LIMITATIONS: meal prep, cleaning, laundry, shopping, and community activity  PERSONAL FACTORS  none  are also  affecting patient's functional outcome.   REHAB POTENTIAL: Good  CLINICAL DECISION MAKING: Stable/uncomplicated  EVALUATION COMPLEXITY: Low   GOALS: Goals reviewed with patient? Yes  SHORT TERM GOALS: Target date: 02/01/2022   Pt to be independent with initial HEP  Goal status: INITIAL  2.  Pt to demo improved ability for sit to to stand, with ability for full upright posture with initial standing.   Goal status: INITIAL    LONG TERM GOALS: Target date: 03/15/2022   Pt to be independent with final HEP  Goal status: INITIAL  2.  Pt to report decreased pain in bil hips to 0-3/10 with standing activity and transfers.  :  Goal status: INITIAL  3.  Pt to demo improved strength of Bil hips and knees to at least 4+/5 to improve stability and pain   Goal status: INITIAL  4. Pt to demo improved upright posture and gait mechanics to be [facility] for pt age and dx, for improved abilty for exercise and walking up to 25 mi .   Goal status: INITIAL    PLAN: PT FREQUENCY: 2x/week  PT DURATION: 8 weeks  PLANNED INTERVENTIONS: Therapeutic exercises, Therapeutic activity, Neuromuscular re-education, Balance training, Gait training, Patient/Family education, Self Care, Joint mobilization, Joint manipulation, Stair training, DME instructions, Aquatic Therapy, Dry Needling, Electrical stimulation, Spinal manipulation, Spinal mobilization, Cryotherapy, Moist heat, Taping, Vasopneumatic device, Traction, Ultrasound, Ionotophoresis '4mg'$ /ml Dexamethasone, and Manual therapy  PLAN FOR NEXT SESSION:   Rudi Heap PT, DPT 02/26/22  1:28 PM

## 2022-02-22 NOTE — Progress Notes (Signed)
I, Cassandra [name], LAT, ATC acting as a scribe for Cassandra Leader, MD.  Cassandra [name] is a 61 y.o. female who presents to [place] at [facility] today for f/u multifactorial bilat hip pain, L>R, and LBP. Pt was last seen by Dr.Jamae Tison on 12/13/21 and was referred to PT, completing 7 visits. Today, pt reports she suffered a fall, 9/19, trying to go up the steps are her daughter's house, but soreness is resolving. No numbness/tingling noted at present.   She has pain primarily in the lateral left hip but does note occasional anterior hip pain of both hips.  Overall she is improving with physical therapy.  Dx imaging: 11/28/21 L hip XR  Pertinent review of systems: No fevers or chills  Relevant historical information: Chronic kidney disease.  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   Exam:  BP (!) 144/88   Pulse 87   Ht '5\' 4"'$  (1.626 m)   Wt 184 lb (83.5 kg)   SpO2 100%   BMI 31.58 kg/m  General: Well Developed, well nourished, and in no acute distress.   MSK: Hips bilaterally normal.  Normal motion.  Tender palpation left hip greater trochanter    Lab and Radiology Results  Procedure: Real-time Ultrasound Guided Injection of left hip greater trochanter bursa Device: Philips Affiniti 50G Images permanently stored and available for review in PACS Verbal informed consent obtained.  Discussed risks and benefits of procedure. Warned about infection, bleeding, hyperglycemia damage to structures among others. Patient expresses understanding and agreement Time-out conducted.   Noted no overlying erythema, induration, or other signs of local infection.   Skin prepped in a sterile fashion.   Local anesthesia: Topical Ethyl chloride.   With sterile technique and under real time ultrasound guidance: 40 mg of Kenalog and 2 mL of Marcaine injected into greater trochanter bursa left hip. Fluid seen entering the bursa.    Completed without difficulty   Pain immediately resolved suggesting accurate placement of the medication.   Advised to call if fevers/chills, erythema, induration, drainage, or persistent bleeding.   Images permanently stored and available for review in the ultrasound unit.  Impression: Technically successful ultrasound guided injection.         Assessment and Plan: 61 y.o. female with bilateral hip pain primarily located at the left lateral hip thought to be trochanteric bursitis and hip abductor tendinopathy.  She does have some degenerative changes of both hips thought to be responsible for her anterior hip pain.  Plan today for greater trochanter bursa injection and continued physical therapy and home exercise program.  If not improved by the time she finishes formal physical therapy she can let me know or return to clinic.  Would consider MRI arthrogram of whichever hip is generating the worst anterior hip pain.   PDMP not reviewed this encounter. Orders Placed This Encounter  Procedures   [place] LIMITED JOINT SPACE STRUCTURES LOW BILAT(NO LINKED CHARGES)    Order Specific Question:   Reason for Exam (SYMPTOM  OR DIAGNOSIS REQUIRED)    Answer:   bilateral hip pain    Order Specific Question:   Preferred imaging location?    Answer:   [place]   No orders of the defined types were placed in this encounter.    Discussed warning signs or symptoms. Please see discharge instructions. Patient expresses understanding.   The above documentation has been reviewed and is  accurate and complete Cassandra [name], M.D.

## 2022-02-23 ENCOUNTER — Ambulatory Visit: Payer: Medicare Other | Admitting: Family Medicine

## 2022-02-23 ENCOUNTER — Ambulatory Visit: Payer: Self-pay

## 2022-02-23 VITALS — BP 144/88 | HR 87 | Ht 64.0 in | Wt 184.0 lb

## 2022-02-23 DIAGNOSIS — M25551 Pain in right hip: Secondary | ICD-10-CM

## 2022-02-23 DIAGNOSIS — M25552 Pain in left hip: Secondary | ICD-10-CM

## 2022-02-23 NOTE — Patient Instructions (Addendum)
Thank you for coming in today.   You received an injection today. Seek immediate medical attention if the joint becomes red, extremely painful, or is oozing fluid.   Finish out physical therapy  If not improvement, recheck or let me know.

## 2022-02-26 ENCOUNTER — Ambulatory Visit: Payer: Medicare Other | Admitting: Physical Therapy

## 2022-02-26 ENCOUNTER — Encounter: Payer: Self-pay | Admitting: Physical Therapy

## 2022-02-26 DIAGNOSIS — M25552 Pain in left hip: Secondary | ICD-10-CM

## 2022-02-26 DIAGNOSIS — M25561 Pain in right knee: Secondary | ICD-10-CM

## 2022-02-26 DIAGNOSIS — G8929 Other chronic pain: Secondary | ICD-10-CM | POA: Diagnosis not present

## 2022-02-26 DIAGNOSIS — M25551 Pain in right hip: Secondary | ICD-10-CM

## 2022-02-26 DIAGNOSIS — Z944 Liver transplant status: Secondary | ICD-10-CM | POA: Diagnosis not present

## 2022-02-26 DIAGNOSIS — D849 Immunodeficiency, unspecified: Secondary | ICD-10-CM | POA: Diagnosis not present

## 2022-02-28 ENCOUNTER — Encounter: Payer: Self-pay | Admitting: Physical Therapy

## 2022-02-28 ENCOUNTER — Other Ambulatory Visit: Payer: Self-pay

## 2022-02-28 ENCOUNTER — Ambulatory Visit: Payer: Medicare Other | Admitting: Family Medicine

## 2022-02-28 ENCOUNTER — Telehealth: Payer: Self-pay | Admitting: Family Medicine

## 2022-02-28 ENCOUNTER — Ambulatory Visit: Payer: Medicare Other | Admitting: Physical Therapy

## 2022-02-28 DIAGNOSIS — M25561 Pain in right knee: Secondary | ICD-10-CM

## 2022-02-28 DIAGNOSIS — M25552 Pain in left hip: Secondary | ICD-10-CM

## 2022-02-28 DIAGNOSIS — M25551 Pain in right hip: Secondary | ICD-10-CM

## 2022-02-28 DIAGNOSIS — G8929 Other chronic pain: Secondary | ICD-10-CM

## 2022-02-28 MED ORDER — DICLOFENAC SODIUM 1 % EX GEL: 4.0000 g | Freq: Four times a day (QID) | CUTANEOUS | 3 refills | Status: AC | PRN

## 2022-02-28 NOTE — Therapy (Addendum)
OUTPATIENT PHYSICAL THERAPY LOWER EXTREMITY TREATMENT   Patient Name: Cassandra [name] MRN: [id number] DOB:1960/12/05, 61 y.o., female Today's Date: 02/28/2022    PT End of Session - 02/28/22 1350     Visit Number 9    Number of Visits 16    Date for PT Re-Evaluation 03/15/22    Authorization Type UHC Medicare    PT Start Time 1350    PT Stop Time 6629    PT Time Calculation (min) 39 min    Activity Tolerance Patient tolerated treatment well    Behavior During Therapy WFL for tasks assessed/performed             Past Medical History:  Diagnosis Date   Anxiety    AS (sickle cell trait) ([place]) 06/29/2019   Autoimmune hepatitis ([place])    Chronic renal insufficiency    Cirrhosis ([place])    COLONIC POLYPS, ADENOMATOUS, HX OF 05/05/2008   Qualifier: Diagnosis of  By: Harlon Ditty CMA (AAMA), [name]     Depression    External hemorrhoids    Fibromyalgia    GERD (gastroesophageal reflux disease)    Hyperlipidemia    Internal hemorrhoids    Iron deficiency anemia    Liver replaced by transplant ([place]) 02/22/2009   Qualifier: Diagnosis of  By: Chester Holstein NP, Nevin Bloodgood     Migraine    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    Ruptured disc, cervical    multiple levels   Statin intolerance 05/13/2018   Past Surgical History:  Procedure Laterality Date   ABDOMINAL HYSTERECTOMY  2010   KNEE ARTHROSCOPY Right 2010   LIVER TRANSPLANT  1992   ROTATOR CUFF REPAIR Left 2010   x2   Patient Active Problem List   Diagnosis Date Noted   Diet-controlled type 2 diabetes mellitus ([place]) 08/28/2021   Chronic kidney disease (CKD) stage G3a/A1, moderately decreased glomerular filtration rate (GFR) between 45-59 mL/min/1.73 square meter and albuminuria creatinine ratio less than 30 mg/g ([place]) 03/01/2021   AS (sickle cell trait) ([place]) 06/29/2019   Migraine with aura and without status migrainosus, not intractable 07/03/2018   Statin intolerance 05/13/2018   Cervical facet joint syndrome 03/28/2018    Spondylosis of cervical region without myelopathy or radiculopathy 03/28/2018   Lumbar facet arthropathy 12/27/2017   Primary insomnia 11/04/2017   Immunosuppressed status ([place]) 10/07/2017   Adenomatous polyp of colon 09/28/2017   Cervical stenosis of spine 01/25/2017   Fibromyalgia 08/24/2013   Mixed hyperlipidemia 03/10/2013   Primary osteoarthritis of knees, bilateral 03/01/2013   Chronic back pain 10/13/2012   Hx of tuberculosis 10/13/2012   History of liver transplant ([place]) 02/22/2009   Anxiety disorder 09/16/2007    PCP: Billey Chang   REFERRING PROVIDER: [name]  REFERRING DIAG: bil hip pain, low back pain  THERAPY DIAG:  Pain in left hip  Pain in right hip  Chronic pain of right knee  Rationale for Evaluation and Treatment Rehabilitation  ONSET DATE:   SUBJECTIVE:   SUBJECTIVE STATEMENT: Patient states that she was feeling good until she came in and was sitting in the air.    Pt states Bil hip pain, Usually worse on the L. Feels like she cant stand up straight with initial standing.  Pain into groin on L. New pain, since June.  Kidney Transplant 52 Also has R knee pain /OA Feels a little dizzy when laying on mat table during Evaluation.  BP: 140/94.   108 pulse.   PERTINENT HISTORY: TIA- feb 23,  PAIN:  Are you having pain? Yes: NPRS scale: 0/10 Pain location: L hip Pain description: stiff, sore Aggravating factors: standing, walking, transfers Relieving factors: none stated   Are you having pain? Yes: NPRS scale: 0/10 Pain location: R hip Pain description: stiff, sore Aggravating factors: standing, transfers,  Relieving factors: none stated.   PRECAUTIONS: None  WEIGHT BEARING RESTRICTIONS No  FALLS:  Has patient fallen in last 6 months? No   PLOF: Independent  PATIENT GOALS   Decreased    OBJECTIVE:   DIAGNOSTIC FINDINGS:   COGNITION:  Overall cognitive status: Within functional limits for tasks assessed      POSTURE:  Standing: L hip higher, R knee valgus, possible scoliosis at t/l spine.   PALPATION:   LOWER EXTREMITY ROM:  Mild stiffness in bil hips, L >R. Knees: WFL  Lumbar: Mod limitation for flexion, extension, Mild/mod limitation for SB.  LOWER EXTREMITY MMT:  MMT Right eval Left eval  Hip flexion 4- 4-  Hip extension    Hip abduction 4- 4-  Hip adduction    Hip internal rotation    Hip external rotation    Knee flexion 4 4+  Knee extension 4 4+  Ankle dorsiflexion    Ankle plantarflexion    Ankle inversion    Ankle eversion     (Blank rows = not tested)  LOWER EXTREMITY SPECIAL TESTS:   GAIT Distance walked: 50 ft x2;  Assistive device utilized: None Level of assistance: Independent  Comments: posture:   TODAY'S TREATMENT: 02/28/2022 Therapeutic Exercise: Aerobic: Recumbent bike L2 x  4 min; Supine: SLR 3x5 on L (extensor lag noted, but without assist today),  2x10 on R;  Bridging  2 x 10 bil;  SAQ x 15 on L; bent knee fall ins 2 minutes, bent knee fall outs 2 minutes, SKC x3 30" holds B S/L :   Seated: Sit to stand x 10, higher mat table for knee pain:  LAQ 2 x10 5" holds bil;    Standing:    Stretches:    Neuromuscular Re-education: Manual Therapy:   02/21/22: Therapeutic Exercise: Aerobic: Recumbent bike L2 x  6 min; Supine: SLR 3x5 on L (with assist ) ,   2x10 on R;   Bridging  2 x 10 bil;  SAQ x 15 on L;  S/L : hip abd 3x5 on L,    Seated: Sit to stand x 10, higher mat table for knee pain:  LAQ 2 x10  bil;    Standing:    Pre-gait: Staggered stance weight shifts a/p for stability and upright posture in L hip/trunk; fwd/bwd weight shifting with stepping a/p x 10 bil;  Slow march x 20;  Stretches:    Neuromuscular Re-education: Manual Therapy:   02/19/22: Therapeutic Exercise: Aerobic: Recumbent bike L2 x  6 min; Supine: SLR 3x5 on L; 2x10 on R;   Supine march with TA x 15; Clams GTB x 20 with TA;  Bridging  x 10 bil;  TA with education on activation x 20,  3 sec holds; thomas stretch x4 15" holds B S/L : hip abd 3x5 on L,   Clams 2 x 10 bil;  Seated: Sit to stand x 10, higher mat table for knee pain: LAQ x 10 bil;    Standing:      Stretches:    Neuromuscular Re-education: Manual Therapy:    PATIENT EDUCATION:  Education details: reviewed HEP Person educated: Patient Education method: Explanation, Demonstration, Tactile cues, Verbal cues, and Handouts  Education comprehension: verbalized understanding, returned demonstration, verbal cues required, tactile cues required, and needs further education   HOME EXERCISE PROGRAM: Access Code: [email or web address]   ASSESSMENT:  CLINICAL IMPRESSION:  02/28/2022 Added additional hip exercises on this day and these were tolerated well. Sight pain in right knee limiting with STS, but this improved with increased table height. Fatigue in legs noted end of session but no increase hip pain.    Eval:Patient presents with primary complaint of increased pain in bil l>R hips, and R knee. Pt with decreased posture, with likely scoliosis and valgus of R knee. Posture and weakness in LEs are effecting gait mechanics and ability. Pt with decreased ability for full functional activities and IADLs. Pt to benefit from skilled PT to improve.   OBJECTIVE IMPAIRMENTS Abnormal gait, decreased activity tolerance, decreased mobility, difficulty walking, decreased ROM, decreased strength, increased muscle spasms, impaired flexibility, improper body mechanics, and pain.   ACTIVITY LIMITATIONS carrying, lifting, bending, standing, squatting, stairs, transfers, and locomotion level  PARTICIPATION LIMITATIONS: meal prep, cleaning, laundry, shopping, and community activity  PERSONAL FACTORS  none  are also affecting patient's functional outcome.   REHAB POTENTIAL: Good  CLINICAL DECISION MAKING: Stable/uncomplicated  EVALUATION COMPLEXITY: Low   GOALS: Goals reviewed with patient? Yes  SHORT TERM GOALS: Target date:  02/01/2022   Pt to be independent with initial HEP  Goal status: INITIAL  2.  Pt to demo improved ability for sit to to stand, with ability for full upright posture with initial standing.   Goal status: INITIAL    LONG TERM GOALS: Target date: 03/15/2022   Pt to be independent with final HEP  Goal status: INITIAL  2.  Pt to report decreased pain in bil hips to 0-3/10 with standing activity and transfers.  :  Goal status: INITIAL  3.  Pt to demo improved strength of Bil hips and knees to at least 4+/5 to improve stability and pain   Goal status: INITIAL  4. Pt to demo improved upright posture and gait mechanics to be [facility] for pt age and dx, for improved abilty for exercise and walking up to 25 mi .   Goal status: INITIAL    PLAN: PT FREQUENCY: 2x/week  PT DURATION: 8 weeks  PLANNED INTERVENTIONS: Therapeutic exercises, Therapeutic activity, Neuromuscular re-education, Balance training, Gait training, Patient/Family education, Self Care, Joint mobilization, Joint manipulation, Stair training, DME instructions, Aquatic Therapy, Dry Needling, Electrical stimulation, Spinal manipulation, Spinal mobilization, Cryotherapy, Moist heat, Taping, Vasopneumatic device, Traction, Ultrasound, Ionotophoresis '4mg'$ /ml Dexamethasone, and Manual therapy  PLAN FOR NEXT SESSION:   1:11 PM, 03/05/22 Jerene Pitch, DPT Physical Therapy with [facility]

## 2022-02-28 NOTE — Telephone Encounter (Signed)
  MEDICATION:  diclofenac Sodium (VOLTAREN) 1 % GEL [[id number]]   Is the patient out of medication?  yes  PHARMACY: [facility] [place], [address] AT [address]  [place], [place] [address]  Phone:  [phone]  Fax:  [phone]  DEA #:  [id number]

## 2022-02-28 NOTE — Telephone Encounter (Signed)
Rx sent 

## 2022-03-02 ENCOUNTER — Encounter: Payer: Medicare Other | Admitting: Physical Therapy

## 2022-03-05 ENCOUNTER — Ambulatory Visit: Payer: Medicare Other | Admitting: Physical Therapy

## 2022-03-05 ENCOUNTER — Encounter: Payer: Self-pay | Admitting: Physical Therapy

## 2022-03-05 DIAGNOSIS — M25551 Pain in right hip: Secondary | ICD-10-CM | POA: Diagnosis not present

## 2022-03-05 DIAGNOSIS — M25552 Pain in left hip: Secondary | ICD-10-CM | POA: Diagnosis not present

## 2022-03-05 DIAGNOSIS — M25561 Pain in right knee: Secondary | ICD-10-CM | POA: Diagnosis not present

## 2022-03-05 DIAGNOSIS — G8929 Other chronic pain: Secondary | ICD-10-CM

## 2022-03-05 NOTE — Therapy (Signed)
OUTPATIENT PHYSICAL THERAPY LOWER EXTREMITY TREATMENT    Physical Therapy Progress Note  Dates of Reporting Period: 01/18/22  to 03/05/22     Patient Name: HALEEMAH BUCKALEW MRN: [id number] DOB:April 05, 1961, 61 y.o., female Today's Date: 03/05/2022    PT End of Session - 03/05/22 1224     Visit Number 10    Number of Visits 16    Date for PT Re-Evaluation 03/15/22    Authorization Type [employer] Medicare,  PN done at visit 10.    PT Start Time 1220    PT Stop Time 1300    PT Time Calculation (min) 40 min    Activity Tolerance Patient tolerated treatment well    Behavior During Therapy WFL for tasks assessed/performed              Past Medical History:  Diagnosis Date   Anxiety    AS (sickle cell trait) ([place]) 06/29/2019   Autoimmune hepatitis ([place])    Chronic renal insufficiency    Cirrhosis ([place])    COLONIC POLYPS, ADENOMATOUS, HX OF 05/05/2008   Qualifier: Diagnosis of  By: Harlon Ditty CMA (AAMA), [name]     Depression    External hemorrhoids    Fibromyalgia    GERD (gastroesophageal reflux disease)    Hyperlipidemia    Internal hemorrhoids    Iron deficiency anemia    Liver replaced by transplant ([place]) 02/22/2009   Qualifier: Diagnosis of  By: Chester Holstein NP, Nevin Bloodgood     Migraine    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    Ruptured disc, cervical    multiple levels   Statin intolerance 05/13/2018   Past Surgical History:  Procedure Laterality Date   ABDOMINAL HYSTERECTOMY  2010   KNEE ARTHROSCOPY Right 2010   LIVER TRANSPLANT  1992   ROTATOR CUFF REPAIR Left 2010   x2   Patient Active Problem List   Diagnosis Date Noted   Diet-controlled type 2 diabetes mellitus ([place]) 08/28/2021   Chronic kidney disease (CKD) stage G3a/A1, moderately decreased glomerular filtration rate (GFR) between 45-59 mL/min/1.73 square meter and albuminuria creatinine ratio less than 30 mg/g ([place]) 03/01/2021   AS (sickle cell trait) ([place]) 06/29/2019   Migraine with aura and without status  migrainosus, not intractable 07/03/2018   Statin intolerance 05/13/2018   Cervical facet joint syndrome 03/28/2018   Spondylosis of cervical region without myelopathy or radiculopathy 03/28/2018   Lumbar facet arthropathy 12/27/2017   Primary insomnia 11/04/2017   Immunosuppressed status ([place]) 10/07/2017   Adenomatous polyp of colon 09/28/2017   Cervical stenosis of spine 01/25/2017   Fibromyalgia 08/24/2013   Mixed hyperlipidemia 03/10/2013   Primary osteoarthritis of knees, bilateral 03/01/2013   Chronic back pain 10/13/2012   Hx of tuberculosis 10/13/2012   History of liver transplant ([place]) 02/22/2009   Anxiety disorder 09/16/2007    PCP: Billey Chang   REFERRING PROVIDER: [name]  REFERRING DIAG: bil hip pain, low back pain  THERAPY DIAG:  Pain in left hip  Pain in right hip  Chronic pain of right knee  Rationale for Evaluation and Treatment Rehabilitation  ONSET DATE:   SUBJECTIVE:   SUBJECTIVE STATEMENT: Patient states hip pain is improving.   Eval: Pt states Bil hip pain, Usually worse on the L. Feels like she cant stand up straight with initial standing.  Pain into groin on L. New pain, since June.  Kidney Transplant 75 Also has R knee pain /OA Feels a little dizzy when laying on mat table during Evaluation.  BP:  140/94.   108 pulse.   PERTINENT HISTORY: TIA- feb 23,   PAIN:  Are you having pain? Yes: NPRS scale: 0/10 Pain location: L hip Pain description: stiff, sore Aggravating factors: standing, walking, transfers Relieving factors: none stated   Are you having pain? Yes: NPRS scale: 0/10 Pain location: R hip Pain description: stiff, sore Aggravating factors: standing, transfers,  Relieving factors: none stated.   PRECAUTIONS: None  WEIGHT BEARING RESTRICTIONS No  FALLS:  Has patient fallen in last 6 months? No   PLOF: Independent  PATIENT GOALS   Decreased    OBJECTIVE: updated 10/2   DIAGNOSTIC  FINDINGS:   COGNITION:  Overall cognitive status: Within functional limits for tasks assessed      POSTURE: Standing: L hip higher, R knee valgus, possible scoliosis at t/l spine.   PALPATION:   LOWER EXTREMITY ROM:  Mild stiffness in bil hips, L >R. Knees: WFL  Lumbar: Mod limitation for flexion, extension, Mild/mod limitation for SB.  LOWER EXTREMITY MMT:  MMT Right eval Left eval R 10/2 L 10/2  Hip flexion 4- 4- 4 4  Hip extension      Hip abduction 4- 4- 4+ 4  Hip adduction      Hip internal rotation      Hip external rotation      Knee flexion 4 4+ 4+ 4+  Knee extension 4 4+ 4+ 4  Ankle dorsiflexion      Ankle plantarflexion      Ankle inversion      Ankle eversion       (Blank rows = not tested)  LOWER EXTREMITY SPECIAL TESTS:   GAIT Distance walked: 50 ft x2;  Assistive device utilized: None Level of assistance: Independent  Comments: posture:   TODAY'S TREATMENT:  03/05/2022 Therapeutic Exercise: Aerobic: Recumbent bike L2 x  6 min; Supine: SLR x 10  bil;   Bridging  2 x 10 bil;   S/L:  hip abd  2x10 bil;  Seated:   LAQ 2 x10 , 4lb bil;    Standing:  Marching x 20;  Hip abd 2x10 bil;  side stepping 20 ft x 4;  Stretches:    Neuromuscular Re-education: Manual Therapy:   02/21/22: Therapeutic Exercise: Aerobic: Recumbent bike L2 x  6 min; Supine: SLR 3x5 on L (with assist ) ,   2x10 on R;   Bridging  2 x 10 bil;  SAQ x 15 on L;  S/L : hip abd 3x5 on L,    Seated: Sit to stand x 10, higher mat table for knee pain:  LAQ 2 x10  bil;    Standing:    Pre-gait: Staggered stance weight shifts a/p for stability and upright posture in L hip/trunk; fwd/bwd weight shifting with stepping a/p x 10 bil;  Slow march x 20;  Stretches:    Neuromuscular Re-education: Manual Therapy:    PATIENT EDUCATION:  Education details: reviewed HEP Person educated: Patient Education method: Consulting civil engineer, Demonstration, Corporate treasurer cues, Verbal cues, and  Handouts Education comprehension: verbalized understanding, returned demonstration, verbal cues required, tactile cues required, and needs further education   HOME EXERCISE PROGRAM: Access Code: [email or web address]   ASSESSMENT:  CLINICAL IMPRESSION:  03/05/2022 PN:  Much improved ability for LAQ, with weight today on L, and much improved ability for SLR/hip strength with exercises and testing today. Pt still has much instability in trunk with standing activity, will benefit from continued care with focus on hip/core strength and stability. Pain in hip seems to  be improving since injection. Will benefit from continued care.    .  Eval:Patient presents with primary complaint of increased pain in bil l>R hips, and R knee. Pt with decreased posture, with likely scoliosis and valgus of R knee. Posture and weakness in LEs are effecting gait mechanics and ability. Pt with decreased ability for full functional activities and IADLs. Pt to benefit from skilled PT to improve.   OBJECTIVE IMPAIRMENTS Abnormal gait, decreased activity tolerance, decreased mobility, difficulty walking, decreased ROM, decreased strength, increased muscle spasms, impaired flexibility, improper body mechanics, and pain.   ACTIVITY LIMITATIONS carrying, lifting, bending, standing, squatting, stairs, transfers, and locomotion level  PARTICIPATION LIMITATIONS: meal prep, cleaning, laundry, shopping, and community activity  PERSONAL FACTORS  none  are also affecting patient's functional outcome.   REHAB POTENTIAL: Good  CLINICAL DECISION MAKING: Stable/uncomplicated  EVALUATION COMPLEXITY: Low   GOALS: Goals reviewed with patient? Yes  SHORT TERM GOALS: Target date: 02/01/2022   Pt to be independent with initial HEP  Goal status: INITIAL  2.  Pt to demo improved ability for sit to to stand, with ability for full upright posture with initial standing.   Goal status: INITIAL    LONG TERM GOALS: Target date: 03/15/2022    Pt to be independent with final HEP  Goal status: INITIAL  2.  Pt to report decreased pain in bil hips to 0-3/10 with standing activity and transfers.  :  Goal status: INITIAL  3.  Pt to demo improved strength of Bil hips and knees to at least 4+/5 to improve stability and pain   Goal status: INITIAL  4. Pt to demo improved upright posture and gait mechanics to be [facility] for pt age and dx, for improved abilty for exercise and walking up to 25 mi .   Goal status: INITIAL    PLAN: PT FREQUENCY: 2x/week  PT DURATION: 8 weeks  PLANNED INTERVENTIONS: Therapeutic exercises, Therapeutic activity, Neuromuscular re-education, Balance training, Gait training, Patient/Family education, Self Care, Joint mobilization, Joint manipulation, Stair training, DME instructions, Aquatic Therapy, Dry Needling, Electrical stimulation, Spinal manipulation, Spinal mobilization, Cryotherapy, Moist heat, Taping, Vasopneumatic device, Traction, Ultrasound, Ionotophoresis '4mg'$ /ml Dexamethasone, and Manual therapy  PLAN FOR NEXT SESSION:   Lyndee Hensen, PT, DPT 12:25 PM  03/05/22

## 2022-03-07 ENCOUNTER — Encounter: Payer: Self-pay | Admitting: Physical Therapy

## 2022-03-07 ENCOUNTER — Ambulatory Visit: Payer: Medicare Other | Admitting: Physical Therapy

## 2022-03-07 DIAGNOSIS — M25551 Pain in right hip: Secondary | ICD-10-CM | POA: Diagnosis not present

## 2022-03-07 DIAGNOSIS — G8929 Other chronic pain: Secondary | ICD-10-CM | POA: Diagnosis not present

## 2022-03-07 DIAGNOSIS — M25552 Pain in left hip: Secondary | ICD-10-CM

## 2022-03-07 DIAGNOSIS — M25561 Pain in right knee: Secondary | ICD-10-CM

## 2022-03-07 NOTE — Therapy (Signed)
OUTPATIENT PHYSICAL THERAPY LOWER EXTREMITY TREATMENT     Patient Name: Cassandra [name] MRN: [id number] DOB:09-27-1960, 61 y.o., female Today's Date: 03/07/2022    PT End of Session - 03/07/22 1214     Visit Number 11    Number of Visits 16    Date for PT Re-Evaluation 03/15/22    Authorization Type [employer] Medicare,  PN done at visit 10.    PT Start Time 1210    PT Stop Time 1250    PT Time Calculation (min) 40 min    Activity Tolerance Patient tolerated treatment well    Behavior During Therapy WFL for tasks assessed/performed              Past Medical History:  Diagnosis Date   Anxiety    AS (sickle cell trait) ([place]) 06/29/2019   Autoimmune hepatitis ([place])    Chronic renal insufficiency    Cirrhosis ([place])    COLONIC POLYPS, ADENOMATOUS, HX OF 05/05/2008   Qualifier: Diagnosis of  By: Harlon Ditty CMA (AAMA), [name]     Depression    External hemorrhoids    Fibromyalgia    GERD (gastroesophageal reflux disease)    Hyperlipidemia    Internal hemorrhoids    Iron deficiency anemia    Liver replaced by transplant ([place]) 02/22/2009   Qualifier: Diagnosis of  By: Chester Holstein NP, Nevin Bloodgood     Migraine    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    Ruptured disc, cervical    multiple levels   Statin intolerance 05/13/2018   Past Surgical History:  Procedure Laterality Date   ABDOMINAL HYSTERECTOMY  2010   KNEE ARTHROSCOPY Right 2010   LIVER TRANSPLANT  1992   ROTATOR CUFF REPAIR Left 2010   x2   Patient Active Problem List   Diagnosis Date Noted   Diet-controlled type 2 diabetes mellitus ([place]) 08/28/2021   Chronic kidney disease (CKD) stage G3a/A1, moderately decreased glomerular filtration rate (GFR) between 45-59 mL/min/1.73 square meter and albuminuria creatinine ratio less than 30 mg/g ([place]) 03/01/2021   AS (sickle cell trait) ([place]) 06/29/2019   Migraine with aura and without status migrainosus, not intractable 07/03/2018   Statin intolerance 05/13/2018   Cervical  facet joint syndrome 03/28/2018   Spondylosis of cervical region without myelopathy or radiculopathy 03/28/2018   Lumbar facet arthropathy 12/27/2017   Primary insomnia 11/04/2017   Immunosuppressed status ([place]) 10/07/2017   Adenomatous polyp of colon 09/28/2017   Cervical stenosis of spine 01/25/2017   Fibromyalgia 08/24/2013   Mixed hyperlipidemia 03/10/2013   Primary osteoarthritis of knees, bilateral 03/01/2013   Chronic back pain 10/13/2012   Hx of tuberculosis 10/13/2012   History of liver transplant ([place]) 02/22/2009   Anxiety disorder 09/16/2007    PCP: Billey Chang   REFERRING PROVIDER: [name]  REFERRING DIAG: bil hip pain, low back pain  THERAPY DIAG:  Pain in left hip  Pain in right hip  Chronic pain of right knee  Rationale for Evaluation and Treatment Rehabilitation  ONSET DATE:   SUBJECTIVE:   SUBJECTIVE STATEMENT: Patient states hip is sore when "air hits it" .   Eval: Pt states Bil hip pain, Usually worse on the L. Feels like she cant stand up straight with initial standing.  Pain into groin on L. New pain, since June.  Kidney Transplant 24 Also has R knee pain /OA Feels a little dizzy when laying on mat table during Evaluation.  BP: 140/94.   108 pulse.   PERTINENT HISTORY: TIA- feb 23,  PAIN:  Are you having pain? Yes: NPRS scale: 0/10 Pain location: L hip Pain description: stiff, sore Aggravating factors: standing, walking, transfers Relieving factors: none stated   Are you having pain? Yes: NPRS scale: 0/10 Pain location: R hip Pain description: stiff, sore Aggravating factors: standing, transfers,  Relieving factors: none stated.   PRECAUTIONS: None  WEIGHT BEARING RESTRICTIONS No  FALLS:  Has patient fallen in last 6 months? No   PLOF: Independent  PATIENT GOALS   Decreased    OBJECTIVE: updated 10/2   DIAGNOSTIC FINDINGS:   COGNITION:  Overall cognitive status: Within functional limits for tasks  assessed      POSTURE: Standing: L hip higher, R knee valgus, possible scoliosis at t/l spine.   PALPATION:   LOWER EXTREMITY ROM:  Mild stiffness in bil hips, L >R. Knees: WFL  Lumbar: Mod limitation for flexion, extension, Mild/mod limitation for SB.  LOWER EXTREMITY MMT:  MMT Right eval Left eval R 10/2 L 10/2  Hip flexion 4- 4- 4 4  Hip extension      Hip abduction 4- 4- 4+ 4  Hip adduction      Hip internal rotation      Hip external rotation      Knee flexion 4 4+ 4+ 4+  Knee extension 4 4+ 4+ 4  Ankle dorsiflexion      Ankle plantarflexion      Ankle inversion      Ankle eversion       (Blank rows = not tested)  LOWER EXTREMITY SPECIAL TESTS:   GAIT Distance walked: 50 ft x2;  Assistive device utilized: None Level of assistance: Independent  Comments: posture:   TODAY'S TREATMENT:  03/07/2022 Therapeutic Exercise: Aerobic: Recumbent bike L2 x  7 min; Supine: SLR 2 x 10  bil;   Bridging  2 x 10 bil;   Seated:   LAQ 2 x10 , 4lb bil;    Standing:  Rows GTB x 20;  Marching x 20;  Hip abd 2x10 bil;   side stepping 20 ft x 6;  Step ups 6 in , 2 x10 bil, 1 hand rail;  Stretches:    Neuromuscular Re-education: Manual Therapy:   02/21/22: Therapeutic Exercise: Aerobic: Recumbent bike L2 x  6 min; Supine: SLR 3x5 on L (with assist ) ,   2x10 on R;   Bridging  2 x 10 bil;  SAQ x 15 on L;  S/L : hip abd 3x5 on L,    Seated: Sit to stand x 10, higher mat table for knee pain:  LAQ 2 x10  bil;    Standing:    Pre-gait: Staggered stance weight shifts a/p for stability and upright posture in L hip/trunk; fwd/bwd weight shifting with stepping a/p x 10 bil;  Slow march x 20;  Stretches:    Neuromuscular Re-education: Manual Therapy:    PATIENT EDUCATION:  Education details: reviewed HEP Person educated: Patient Education method: Consulting civil engineer, Demonstration, Tactile cues, Verbal cues, and Handouts Education comprehension: verbalized understanding, returned  demonstration, verbal cues required, tactile cues required, and needs further education   HOME EXERCISE PROGRAM: Access Code: [email or web address]   ASSESSMENT:  CLINICAL IMPRESSION:  03/07/2022 Pt with some improvement in gait mechanics, with less trunk sway and SB, when focusing on mechanics and with cuing. She will benefit from continued strength for core and hips, continues to have weakness that is effecting hip stability.    .  Eval:Patient presents with primary complaint of increased pain in bil  l>R hips, and R knee. Pt with decreased posture, with likely scoliosis and valgus of R knee. Posture and weakness in LEs are effecting gait mechanics and ability. Pt with decreased ability for full functional activities and IADLs. Pt to benefit from skilled PT to improve.   OBJECTIVE IMPAIRMENTS Abnormal gait, decreased activity tolerance, decreased mobility, difficulty walking, decreased ROM, decreased strength, increased muscle spasms, impaired flexibility, improper body mechanics, and pain.   ACTIVITY LIMITATIONS carrying, lifting, bending, standing, squatting, stairs, transfers, and locomotion level  PARTICIPATION LIMITATIONS: meal prep, cleaning, laundry, shopping, and community activity  PERSONAL FACTORS  none  are also affecting patient's functional outcome.   REHAB POTENTIAL: Good  CLINICAL DECISION MAKING: Stable/uncomplicated  EVALUATION COMPLEXITY: Low   GOALS: Goals reviewed with patient? Yes  SHORT TERM GOALS: Target date: 02/01/2022   Pt to be independent with initial HEP  Goal status: INITIAL  2.  Pt to demo improved ability for sit to to stand, with ability for full upright posture with initial standing.   Goal status: INITIAL    LONG TERM GOALS: Target date: 03/15/2022   Pt to be independent with final HEP  Goal status: INITIAL  2.  Pt to report decreased pain in bil hips to 0-3/10 with standing activity and transfers.  :  Goal status: INITIAL  3.  Pt to demo  improved strength of Bil hips and knees to at least 4+/5 to improve stability and pain   Goal status: INITIAL  4. Pt to demo improved upright posture and gait mechanics to be [facility] for pt age and dx, for improved abilty for exercise and walking up to 25 mi .   Goal status: INITIAL    PLAN: PT FREQUENCY: 2x/week  PT DURATION: 8 weeks  PLANNED INTERVENTIONS: Therapeutic exercises, Therapeutic activity, Neuromuscular re-education, Balance training, Gait training, Patient/Family education, Self Care, Joint mobilization, Joint manipulation, Stair training, DME instructions, Aquatic Therapy, Dry Needling, Electrical stimulation, Spinal manipulation, Spinal mobilization, Cryotherapy, Moist heat, Taping, Vasopneumatic device, Traction, Ultrasound, Ionotophoresis '4mg'$ /ml Dexamethasone, and Manual therapy  PLAN FOR NEXT SESSION:   Lyndee Hensen, PT, DPT 12:15 PM  03/07/22

## 2022-03-13 ENCOUNTER — Encounter: Payer: Self-pay | Admitting: Physical Therapy

## 2022-03-13 ENCOUNTER — Ambulatory Visit: Payer: Medicare Other | Admitting: Physical Therapy

## 2022-03-13 DIAGNOSIS — M25552 Pain in left hip: Secondary | ICD-10-CM

## 2022-03-13 DIAGNOSIS — G8929 Other chronic pain: Secondary | ICD-10-CM | POA: Diagnosis not present

## 2022-03-13 DIAGNOSIS — M25551 Pain in right hip: Secondary | ICD-10-CM | POA: Diagnosis not present

## 2022-03-13 DIAGNOSIS — M25561 Pain in right knee: Secondary | ICD-10-CM

## 2022-03-13 NOTE — Therapy (Signed)
OUTPATIENT PHYSICAL THERAPY LOWER EXTREMITY TREATMENT     Patient Name: Cassandra [name] MRN: [id number] DOB:1961-03-06, 61 y.o., female Today's Date: 03/13/2022    PT End of Session - 03/13/22 1210     Visit Number 12    Number of Visits 16    Date for PT Re-Evaluation 03/15/22    Authorization Type [employer] Medicare,  PN done at visit 10.    PT Start Time 1215    PT Stop Time 1300    PT Time Calculation (min) 45 min    Activity Tolerance Patient tolerated treatment well    Behavior During Therapy WFL for tasks assessed/performed              Past Medical History:  Diagnosis Date   Anxiety    AS (sickle cell trait) ([place]) 06/29/2019   Autoimmune hepatitis ([place])    Chronic renal insufficiency    Cirrhosis ([place])    COLONIC POLYPS, ADENOMATOUS, HX OF 05/05/2008   Qualifier: Diagnosis of  By: Harlon Ditty CMA (AAMA), [name]     Depression    External hemorrhoids    Fibromyalgia    GERD (gastroesophageal reflux disease)    Hyperlipidemia    Internal hemorrhoids    Iron deficiency anemia    Liver replaced by transplant ([place]) 02/22/2009   Qualifier: Diagnosis of  By: Chester Holstein NP, Nevin Bloodgood     Migraine    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    Ruptured disc, cervical    multiple levels   Statin intolerance 05/13/2018   Past Surgical History:  Procedure Laterality Date   ABDOMINAL HYSTERECTOMY  2010   KNEE ARTHROSCOPY Right 2010   LIVER TRANSPLANT  1992   ROTATOR CUFF REPAIR Left 2010   x2   Patient Active Problem List   Diagnosis Date Noted   Diet-controlled type 2 diabetes mellitus ([place]) 08/28/2021   Chronic kidney disease (CKD) stage G3a/A1, moderately decreased glomerular filtration rate (GFR) between 45-59 mL/min/1.73 square meter and albuminuria creatinine ratio less than 30 mg/g ([place]) 03/01/2021   AS (sickle cell trait) ([place]) 06/29/2019   Migraine with aura and without status migrainosus, not intractable 07/03/2018   Statin intolerance 05/13/2018   Cervical  facet joint syndrome 03/28/2018   Spondylosis of cervical region without myelopathy or radiculopathy 03/28/2018   Lumbar facet arthropathy 12/27/2017   Primary insomnia 11/04/2017   Immunosuppressed status ([place]) 10/07/2017   Adenomatous polyp of colon 09/28/2017   Cervical stenosis of spine 01/25/2017   Fibromyalgia 08/24/2013   Mixed hyperlipidemia 03/10/2013   Primary osteoarthritis of knees, bilateral 03/01/2013   Chronic back pain 10/13/2012   Hx of tuberculosis 10/13/2012   History of liver transplant ([place]) 02/22/2009   Anxiety disorder 09/16/2007    PCP: Billey Chang   REFERRING PROVIDER: [name]  REFERRING DIAG: bil hip pain, low back pain  THERAPY DIAG:  Pain in left hip  Pain in right hip  Chronic pain of right knee  Rationale for Evaluation and Treatment Rehabilitation  ONSET DATE:   SUBJECTIVE:   SUBJECTIVE STATEMENT: Patient states hip is doing better, just a bit sore at times. Thinks she is ready to go to gym and exercise with silver sneakers.   Eval: Pt states Bil hip pain, Usually worse on the L. Feels like she cant stand up straight with initial standing.  Pain into groin on L. New pain, since June.  Kidney Transplant 35 Also has R knee pain /OA Feels a little dizzy when laying on mat table during  Evaluation.  BP: 140/94.   108 pulse.   PERTINENT HISTORY: TIA- feb 23,   PAIN:  Are you having pain? Yes: NPRS scale: 0/10 Pain location: L hip Pain description: stiff, sore Aggravating factors: standing, walking, transfers Relieving factors: none stated   Are you having pain? Yes: NPRS scale: 0/10 Pain location: R hip Pain description: stiff, sore Aggravating factors: standing, transfers,  Relieving factors: none stated.   PRECAUTIONS: None  WEIGHT BEARING RESTRICTIONS No  FALLS:  Has patient fallen in last 6 months? No   PLOF: Independent  PATIENT GOALS   Decreased    OBJECTIVE: updated 10/2   DIAGNOSTIC  FINDINGS:   COGNITION:  Overall cognitive status: Within functional limits for tasks assessed      POSTURE: Standing: L hip higher, R knee valgus, possible scoliosis at t/l spine.   PALPATION:   LOWER EXTREMITY ROM:  Mild stiffness in bil hips, L >R. Knees: WFL  Lumbar: Mod limitation for flexion, extension, Mild/mod limitation for SB.  LOWER EXTREMITY MMT:  MMT Right eval Left eval R 10/2 L 10/2  Hip flexion 4- 4- 4 4  Hip extension      Hip abduction 4- 4- 4+ 4  Hip adduction      Hip internal rotation      Hip external rotation      Knee flexion 4 4+ 4+ 4+  Knee extension 4 4+ 4+ 4  Ankle dorsiflexion      Ankle plantarflexion      Ankle inversion      Ankle eversion       (Blank rows = not tested)  LOWER EXTREMITY SPECIAL TESTS:   GAIT Distance walked: 50 ft x2;  Assistive device utilized: None Level of assistance: Independent  Comments: posture:   TODAY'S TREATMENT:  03/13/2022 Therapeutic Exercise: Aerobic: Recumbent bike L2 x  6 min; Supine: SLR 2 x 10  bil;   Bridging  2 x 10 bil;   S/L hip abd 2x10 R only ( R shoulder too sore to lay on today )  Seated:   LAQ 2 x10 ,    Standing:  Scap squeeze x 20;   Marching x 20;  Hip abd 2x10 bil;  Hip ext 2x10 bil;  Stretches:    Neuromuscular Re-education: Manual Therapy:   PATIENT EDUCATION:  Education details: reviewed and updated HEP Person educated: Patient Education method: Consulting civil engineer, Demonstration, Tactile cues, Verbal cues, and Handouts Education comprehension: verbalized understanding, returned demonstration, verbal cues required, tactile cues required, and needs further education   HOME EXERCISE PROGRAM: Access Code: [email or web address]   ASSESSMENT:  CLINICAL IMPRESSION:  03/13/2022 Pt thinks she is ready to start going to gym and exercise with silver sneakers. Hip pain is doing better. She does have muscle weakness in core and hips, that she will greatly benefit from continued strengthening  for. Reviewed final HEP, and its importance today. Pt has met other goals at this time. Will d/c to HEP, with recommended focus on continued strengthening. Pt states understanding, and is agreement with plan. She will f/u with MD as needed.    .  Eval:Patient presents with primary complaint of increased pain in bil l>R hips, and R knee. Pt with decreased posture, with likely scoliosis and valgus of R knee. Posture and weakness in LEs are effecting gait mechanics and ability. Pt with decreased ability for full functional activities and IADLs. Pt to benefit from skilled PT to improve.   OBJECTIVE IMPAIRMENTS Abnormal gait, decreased activity tolerance, decreased  mobility, difficulty walking, decreased ROM, decreased strength, increased muscle spasms, impaired flexibility, improper body mechanics, and pain.   ACTIVITY LIMITATIONS carrying, lifting, bending, standing, squatting, stairs, transfers, and locomotion level  PARTICIPATION LIMITATIONS: meal prep, cleaning, laundry, shopping, and community activity  PERSONAL FACTORS  none  are also affecting patient's functional outcome.   REHAB POTENTIAL: Good  CLINICAL DECISION MAKING: Stable/uncomplicated  EVALUATION COMPLEXITY: Low   GOALS: Goals reviewed with patient? Yes  SHORT TERM GOALS: Target date: 02/01/2022   Pt to be independent with initial HEP  Goal status: MET  2.  Pt to demo improved ability for sit to to stand, with ability for full upright posture with initial standing.   Goal status: MET    LONG TERM GOALS: Target date: 03/15/2022   Pt to be independent with final HEP  Goal status: MET  2.  Pt to report decreased pain in bil hips to 0-3/10 with standing activity and transfers.  :  Goal status: MET  3.  Pt to demo improved strength of Bil hips and knees to at least 4+/5 to improve stability and pain   Goal status: IN PROGRESS/partially met   4. Pt to demo improved upright posture and gait mechanics to be [facility]  for pt age and dx, for improved abilty for exercise and walking up to 25 mi .   Goal status: IN PROGRESS/partially met    PLAN: PT FREQUENCY: 2x/week  PT DURATION: 8 weeks  PLANNED INTERVENTIONS: Therapeutic exercises, Therapeutic activity, Neuromuscular re-education, Balance training, Gait training, Patient/Family education, Self Care, Joint mobilization, Joint manipulation, Stair training, DME instructions, Aquatic Therapy, Dry Needling, Electrical stimulation, Spinal manipulation, Spinal mobilization, Cryotherapy, Moist heat, Taping, Vasopneumatic device, Traction, Ultrasound, Ionotophoresis 22m/ml Dexamethasone, and Manual therapy  PLAN FOR NEXT SESSION:   LLyndee Hensen PT, DPT 1:56 PM  03/13/22    PHYSICAL THERAPY DISCHARGE SUMMARY  Visits from Start of Care: 12 Plan: Patient agrees to discharge.  Patient goals were  met. Patient is being discharged due to meeting the stated rehab goals.     LLyndee Hensen PT, DPT 2:02 PM  03/13/22

## 2022-03-16 ENCOUNTER — Other Ambulatory Visit: Payer: Self-pay

## 2022-03-16 ENCOUNTER — Encounter: Payer: Medicare Other | Admitting: Physical Therapy

## 2022-03-16 ENCOUNTER — Other Ambulatory Visit: Payer: Self-pay | Admitting: Family Medicine

## 2022-03-16 MED ORDER — FISH OIL 1000 MG PO CAPS: 1.0000 | ORAL_CAPSULE | Freq: Every day | ORAL | 3 refills | Status: AC

## 2022-04-10 ENCOUNTER — Ambulatory Visit (INDEPENDENT_AMBULATORY_CARE_PROVIDER_SITE_OTHER): Payer: Medicare Other | Admitting: Family

## 2022-04-10 ENCOUNTER — Encounter: Payer: Self-pay | Admitting: Family

## 2022-04-10 ENCOUNTER — Other Ambulatory Visit: Payer: Self-pay | Admitting: Family

## 2022-04-10 ENCOUNTER — Ambulatory Visit: Payer: Medicare Other | Admitting: Family Medicine

## 2022-04-10 VITALS — BP 120/85 | HR 92 | Temp 98.1°F | Ht 64.0 in | Wt 184.2 lb

## 2022-04-10 DIAGNOSIS — M1711 Unilateral primary osteoarthritis, right knee: Secondary | ICD-10-CM | POA: Diagnosis not present

## 2022-04-10 DIAGNOSIS — N3090 Cystitis, unspecified without hematuria: Secondary | ICD-10-CM

## 2022-04-10 DIAGNOSIS — M170 Bilateral primary osteoarthritis of knee: Secondary | ICD-10-CM

## 2022-04-10 LAB — POCT URINALYSIS DIPSTICK
Bilirubin, UA: NEGATIVE
Glucose, UA: NEGATIVE
Ketones, UA: NEGATIVE
Nitrite, UA: NEGATIVE
Protein, UA: NEGATIVE
Spec Grav, UA: >=1.03 — AB (ref 1.010–1.025)
Urobilinogen, UA: 0.2 E.U./dL
pH, UA: 6 (ref 5.0–8.0)

## 2022-04-10 MED ORDER — NITROFURANTOIN MONOHYD MACRO 100 MG PO CAPS: 100.0000 mg | ORAL_CAPSULE | Freq: Two times a day (BID) | ORAL | 0 refills | Status: DC

## 2022-04-10 MED ORDER — OXYCODONE-ACETAMINOPHEN 5-325 MG PO TABS: 1.0000 | ORAL_TABLET | Freq: Two times a day (BID) | ORAL | 0 refills | Status: AC | PRN

## 2022-04-10 NOTE — Progress Notes (Signed)
Patient ID: Cassandra [name], female    DOB: 1960-11-18, 61 y.o.   MRN: [id number]  Chief Complaint  Patient presents with   Back Pain    Pt c/o lower back pain and urinary frequency for about 2 weeks. Has tried antibiotics she has already had at home which did help but she ran out.   Knee Pain    Pt c/o right knee pain for a couple of weeks. Hurts usually when its colder outside.     HPI:      Urinary sx:  Pt c/o lower back pain, and urinary urgency & frequency for about 2 weeks. Denies any fever, foul odor, nausea, or pelvic pain. Has tried antibiotics (1 pill for 4 days) she already had at home which did help but she ran out yesterday.  Right knee pain:  pt has chronic pain in both knees, right one has been bothering her more lately, she is having swelling also on left medial, reports seeing sports med in past for steroid injections and Dr Jonni Sanger has drained her knee in the past, states she has taken a few OXY in past to hold her over until able to get the fluid drained.  Assessment & Plan:  1. Cystitis sending Macrobid, advised on use & SE, sending culture, advised pt on water intake & other UTI prevention steps.  - POCT Urinalysis Dipstick - Urine Culture - nitrofurantoin, macrocrystal-monohydrate, (MACROBID) 100 MG capsule; Take 1 capsule (100 mg total) by mouth 2 (two) times daily.  Dispense: 10 capsule; Refill: 0  2. Primary osteoarthritis of right knee chronic, trying to schedule w/ PCP to have drained, unable to take NSAIDs, requesting a few OXY until she can get in to see PCP again.    Subjective:    Outpatient Medications Prior to Visit  Medication Sig Dispense Refill   cyclobenzaprine (FLEXERIL) 10 MG tablet TAKE 1 TABLET BY MOUTH THREE TIMES DAILY AS NEEDED 90 tablet 0   diclofenac Sodium (VOLTAREN) 1 % GEL Apply 4 g topically 4 (four) times daily as needed. 350 g 3   fluticasone (FLONASE) 50 MCG/ACT nasal spray SHAKE LIQUID AND USE 2 SPRAYS IN EACH NOSTRIL DAILY 16 g  6   levocetirizine (XYZAL) 5 MG tablet Take 1 tablet (5 mg total) by mouth every evening. 90 tablet 0   mycophenolate (CELLCEPT) 500 MG tablet Take 500 mg by mouth 2 (two) times daily.     olopatadine (PATANOL) 0.1 % ophthalmic solution INSTILL 1 DROP IN BOTH EYES TWICE DAILY 5 mL 3   Omega-3 Fatty Acids (FISH OIL) 1000 MG CAPS Take 1 capsule (1,000 mg total) by mouth daily. 90 capsule 3   predniSONE (DELTASONE) 1 MG tablet Take 5 mg by mouth daily.     tacrolimus (PROGRAF) 1 MG capsule Take 2 capsules (2 mg total) by mouth 2 (two) times daily.     No facility-administered medications prior to visit.   Past Medical History:  Diagnosis Date   Anxiety    AS (sickle cell trait) ([place]) 06/29/2019   Autoimmune hepatitis ([place])    Chronic renal insufficiency    Cirrhosis ([place])    COLONIC POLYPS, ADENOMATOUS, HX OF 05/05/2008   Qualifier: Diagnosis of  By: [name], [name]     Depression    External hemorrhoids    Fibromyalgia    GERD (gastroesophageal reflux disease)    Hyperlipidemia    Internal hemorrhoids    Iron deficiency anemia    Liver replaced by  transplant ([place]) 02/22/2009   Qualifier: Diagnosis of  By: Chester Holstein NP, Nevin Bloodgood     Migraine    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    Ruptured disc, cervical    multiple levels   Statin intolerance 05/13/2018   Past Surgical History:  Procedure Laterality Date   ABDOMINAL HYSTERECTOMY  2010   KNEE ARTHROSCOPY Right 2010   LIVER TRANSPLANT  1992   ROTATOR CUFF REPAIR Left 2010   x2   Allergies  Allergen Reactions   Erythromycin Diarrhea    REACTION: Nausea/vomiting   Ezetimibe Other (See Comments)    Muscle Cramps Nausea Diarrhea   Other Nausea Only and Other (See Comments)    Knocks her out, or makes her sleepy and does not take them. Muscle Cramps Nausea Diarrhea Uncoded Allergy. Allergen: vioxx   Rofecoxib Other (See Comments)    Pt not sure but knows she can not take it. REACTION: Nausea/vomiting    Statins Other (See Comments)    myalgias   Zonisamide Other (See Comments)    Fogginess Fogginess    Carvacrol     Other reaction(s): Muscle Pain   Elavil [Amitriptyline] Other (See Comments)    groggy   Metoprolol     Other reaction(s): Muscle Pain   Nsaids     REACTION: GI Cramping/nausea      Objective:    Physical Exam Vitals and nursing note reviewed.  Constitutional:      Appearance: Normal appearance.  Cardiovascular:     Rate and Rhythm: Normal rate and regular rhythm.  Pulmonary:     Effort: Pulmonary effort is normal.     Breath sounds: Normal breath sounds.  Musculoskeletal:     Right knee: Swelling (medial) present. No erythema. Decreased range of motion. Tenderness present.  Skin:    General: Skin is warm and dry.  Neurological:     Mental Status: She is alert.  Psychiatric:        Mood and Affect: Mood normal.        Behavior: Behavior normal.    BP 120/85 (BP Location: Left Arm, Patient Position: Sitting, Cuff Size: Large)   Pulse 92   Temp 98.1 F (36.7 C) (Temporal)   Ht '5\' 4"'$  (1.626 m)   Wt 184 lb 4 oz (83.6 kg)   SpO2 100%   BMI 31.63 kg/m  Wt Readings from Last 3 Encounters:  04/10/22 184 lb 4 oz (83.6 kg)  02/23/22 184 lb (83.5 kg)  02/14/22 183 lb (83 kg)       Jeanie Sewer, NP

## 2022-04-11 LAB — URINE CULTURE
MICRO NUMBER:: 14155129
SPECIMEN QUALITY:: ADEQUATE

## 2022-04-12 ENCOUNTER — Encounter: Payer: Self-pay | Admitting: Family Medicine

## 2022-04-12 ENCOUNTER — Ambulatory Visit (INDEPENDENT_AMBULATORY_CARE_PROVIDER_SITE_OTHER): Payer: Medicare Other | Admitting: Family Medicine

## 2022-04-12 VITALS — BP 130/70 | HR 89 | Temp 97.8°F | Ht 64.0 in | Wt 187.0 lb

## 2022-04-12 DIAGNOSIS — R300 Dysuria: Secondary | ICD-10-CM

## 2022-04-12 DIAGNOSIS — M25461 Effusion, right knee: Secondary | ICD-10-CM | POA: Diagnosis not present

## 2022-04-12 DIAGNOSIS — M1711 Unilateral primary osteoarthritis, right knee: Secondary | ICD-10-CM | POA: Diagnosis not present

## 2022-04-12 NOTE — Progress Notes (Signed)
Subjective  CC:  Chief Complaint  Patient presents with   Knee Pain    Pt was seen by [facility] on 04/10/2022   Obesity    HPI: Cassandra [name] is a 61 y.o. female who presents to the office today to address the problems listed above in the chief complaint.  She has OA of the  Right knee(s) and is having pain that impedes her quality of life and activity level. See past notes for further documentation. No contraindications to steroids or Synvisc are identified. We have used alternative measures for pain control including tylenol, nsaids, ice, rest, and other prescribed analgesics with variable success. Swelling and pain started about 2 weeks ago. Has had recurrent effusions that required drainage.  She responds well to steroid injections.  Her last steroid injection was a long while ago.  No fevers or symptoms of infection.  She has chronic osteoarthritis.  She has seen orthopedics in the past.  Reviewed neg urine culture from recent visit.   I reviewed the patients updated PMH, FH, and SocHx.    Patient Active Problem List   Diagnosis Date Noted   Diet-controlled type 2 diabetes mellitus ([place]) 08/28/2021    Priority: High   Chronic kidney disease (CKD) stage G3a/A1, moderately decreased glomerular filtration rate (GFR) between 45-59 mL/min/1.73 square meter and albuminuria creatinine ratio less than 30 mg/g ([facility]) 03/01/2021    Priority: High   Statin intolerance 05/13/2018    Priority: High   Primary insomnia 11/04/2017    Priority: High   Immunosuppressed status ([place]) 10/07/2017    Priority: High   Adenomatous polyp of colon 09/28/2017    Priority: High   Mixed hyperlipidemia 03/10/2013    Priority: High   Primary osteoarthritis of knees, bilateral 03/01/2013    Priority: High   Chronic back pain 10/13/2012    Priority: High   History of liver transplant ([place]) 02/22/2009    Priority: High   Migraine with aura and without status migrainosus, not intractable 07/03/2018     Priority: Medium    Cervical stenosis of spine 01/25/2017    Priority: Medium    Fibromyalgia 08/24/2013    Priority: Medium    Hx of tuberculosis 10/13/2012    Priority: Medium    Anxiety disorder 09/16/2007    Priority: Medium    AS (sickle cell trait) ([place]) 06/29/2019    Priority: Low   Cervical facet joint syndrome 03/28/2018   Spondylosis of cervical region without myelopathy or radiculopathy 03/28/2018   Lumbar facet arthropathy 12/27/2017   No outpatient medications have been marked as taking for the 04/12/22 encounter (Office Visit) with Leamon Arnt, MD.    Allergies: Patient is allergic to erythromycin, ezetimibe, other, rofecoxib, statins, zonisamide, carvacrol, elavil [amitriptyline], metoprolol, and nsaids.  Review of Systems: Constitutional: Negative for fever malaise or anorexia Cardiovascular: negative for chest pain Respiratory: negative for SOB or persistent cough Gastrointestinal: negative for abdominal pain  Objective  Vitals: BP 130/70   Pulse 89   Temp 97.8 F (36.6 C)   Ht '5\' 4"'$  (1.626 m)   Wt 187 lb (84.8 kg)   SpO2 98%   BMI 32.10 kg/m  General: no acute distress , A&Ox3 Cardiovascular:  RRR without murmur  Knee:  Right warm knee with effusion. FROM. Skin:  Warm, no rashes  Procedure note for Knee Joint Aspiration and/or Injection:  Indications: pain relief, failed conservative therapies  Knee Arthrocentesis with Aspiration and Injection Procedure Note  Pre-operative Diagnosis: right osteoarthritis  flare with effusion  Post-operative Diagnosis: same  Indications: Symptomatic relief of large effusion and Symptom relief from osteoarthritis  Anesthesia: cold spray  Procedure Details   Verbal consent was obtained for the procedure. Universal time out taken.  The Knee joint was prepped with alcohol and an 18 gauge needle was inserted into the joint from the lateral approach. 35 ml of clear yellow fluid was removed from the joint and  discarded. Four ml 1% lidocaine and one ml of triamcinolone (KENALOG) '40mg'$ /ml was then injected into the joint through the same needle. The needle was removed and the area cleansed and dressed.  Complications:  None; patient tolerated the procedure well.   Assessment  1. Primary osteoarthritis of right knee   2. Knee effusion, right   3. Dysuria      Plan  S/p intra-articular knee joint aspiration and steroid injection:  Routine post procedure care discussed. Rest, Ice, Nsaids as needed and ROM exercises recommended. F/u care printed out for patient in AVS.  Discussed urgent f/u if develops increased pain, redness or fever.  Stop macrobid; neg bladder infection. Push fluids.   Follow up: as scheduled for cpe 05/10/2022    Commons side effects, risks, benefits, and alternatives for medications and treatment plan prescribed today were discussed, and the patient expressed understanding of the given instructions. Patient is instructed to call or message via MyChart if he/she has any questions or concerns regarding our treatment plan. No barriers to understanding were identified. We discussed Red Flag symptoms and signs in detail. Patient expressed understanding regarding what to do in case of urgent or emergency type symptoms.  Medication list was reconciled, printed and provided to the patient in AVS. Patient instructions and summary information was reviewed with the patient as documented in the AVS. This note was prepared with assistance of Dragon voice recognition software. Occasional wrong-word or sound-a-like substitutions may have occurred due to the inherent limitations of voice recognition software  No orders of the defined types were placed in this encounter.  No orders of the defined types were placed in this encounter.

## 2022-04-12 NOTE — Patient Instructions (Signed)
Please follow up as scheduled for your next visit with me: 05/10/2022   If you have any questions or concerns, please don't hesitate to send me a message via MyChart or call the office at [phone]. Thank you for visiting with [place] today! It's our pleasure caring for you.   You had a steroid injection today.   Things to be aware of after this injection are listed below: You may experience no significant improvement or even a slight worsening in your symptoms during the first 24 to 48 hours.  After that we expect your symptoms to improve gradually over the next 2 weeks for the medicine to have its maximal effect.  You should continue to have improvement out to 6 weeks after your injection. I recommend icing the site of the injection for 20 minutes  1-2 times the day of your injection You may shower but no swimming, tub bath or Jacuzzi for 24 hours. If your bandage falls off this does not need to be replaced.  It is appropriate to remove the bandage after 4 hours. You may resume light activities as tolerated.     POSSIBLE PROCEDURE SIDE EFFECTS: The side effects of the injection are usually fairly minimal however if you may experience some of the following side effects that are usually self-limited and will is off on their own.  If you are concerned please feel free to call the office with questions:             Increased numbness or tingling             Nausea or vomiting             Swelling or bruising at the injection site    Please call our office if if you experience any of the following symptoms over the next week as these can be signs of infection:              Fever greater than 100.59F             Significant swelling at the injection site             Significant redness or drainage from the injection site

## 2022-04-18 MED ORDER — TRIAMCINOLONE ACETONIDE 40 MG/ML IJ SUSP
40.0000 mg | Freq: Once | INTRAMUSCULAR | Status: AC
  Administered 2022-04-12: 40 mg via INTRA_ARTICULAR

## 2022-04-18 NOTE — Addendum Note (Signed)
Addended by: Darlina Rumpf A on: 04/18/2022 04:01 PM   Modules accepted: Orders

## 2022-04-23 NOTE — Progress Notes (Unsigned)
   I, Peterson Lombard, LAT, ATC acting as a scribe for [name], MD.  Cassandra [name] is a 61 y.o. female who presents to [place] [facility] today for low back pain? Pt was previously seen by Dr. Georgina Snell on 02/23/2022 for bilateral hip pain.  Patient completed a prior course of PT, ending on 03/13/2022, completing 12 visits.  Today, patient reports  Radiating pain: LE numbness/tingling: LE weakness: Aggravates: Treatments tried: prior PT (@ [facility])  Dx imaging: 11/28/21 L hip XR   Pertinent review of systems: ***  Relevant historical information: ***   Exam:  There were no vitals taken for this visit. General: Well Developed, well nourished, and in no acute distress.   MSK: ***    Lab and Radiology Results No results found for this or any previous visit (from the past 72 hour(s)). No results found.     Assessment and Plan: 61 y.o. female with ***   PDMP not reviewed this encounter. No orders of the defined types were placed in this encounter.  No orders of the defined types were placed in this encounter.    Discussed warning signs or symptoms. Please see discharge instructions. Patient expresses understanding.   ***

## 2022-04-24 ENCOUNTER — Ambulatory Visit: Payer: Medicare Other | Admitting: Family Medicine

## 2022-04-24 ENCOUNTER — Ambulatory Visit (INDEPENDENT_AMBULATORY_CARE_PROVIDER_SITE_OTHER): Payer: Medicare Other

## 2022-04-24 VITALS — BP 138/94 | Ht 64.0 in | Wt 186.0 lb

## 2022-04-24 DIAGNOSIS — M5442 Lumbago with sciatica, left side: Secondary | ICD-10-CM

## 2022-04-24 DIAGNOSIS — M545 Low back pain, unspecified: Secondary | ICD-10-CM | POA: Diagnosis not present

## 2022-04-24 MED ORDER — OXYCODONE-ACETAMINOPHEN 5-325 MG PO TABS: 1.0000 | ORAL_TABLET | Freq: Three times a day (TID) | ORAL | 0 refills | Status: DC | PRN

## 2022-04-24 MED ORDER — PREDNISONE 10 MG PO TABS: 30.0000 mg | ORAL_TABLET | Freq: Every day | ORAL | 0 refills | Status: DC

## 2022-04-24 NOTE — Patient Instructions (Signed)
Thank you for coming in today.   Please get an Xray today before you leave   Take the prednisone for 5 days.   If not getting better I will move to an MRI.

## 2022-04-30 ENCOUNTER — Ambulatory Visit: Payer: Medicare Other | Admitting: Family Medicine

## 2022-04-30 ENCOUNTER — Telehealth: Payer: Self-pay | Admitting: Family Medicine

## 2022-04-30 VITALS — BP 166/96 | HR 110 | Ht 64.0 in | Wt 187.0 lb

## 2022-04-30 DIAGNOSIS — M545 Low back pain, unspecified: Secondary | ICD-10-CM

## 2022-04-30 DIAGNOSIS — M5442 Lumbago with sciatica, left side: Secondary | ICD-10-CM

## 2022-04-30 DIAGNOSIS — G8929 Other chronic pain: Secondary | ICD-10-CM | POA: Diagnosis not present

## 2022-04-30 MED ORDER — OXYCODONE-ACETAMINOPHEN 5-325 MG PO TABS: 1.0000 | ORAL_TABLET | Freq: Three times a day (TID) | ORAL | 0 refills | Status: DC | PRN

## 2022-04-30 NOTE — Progress Notes (Signed)
   I, Peterson Lombard, LAT, ATC acting as a scribe for [name], MD.  Cassandra [name] is a 61 y.o. female who presents to [place] at [facility] today for cont'd lumbar sacral radiculopathy along the left leg in a L5 and S1 dermatomal pattern. Pt was last seen by Dr. Georgina Snell on 04/24/22 and was prescribed prednisone and oxycodone. Today, pt reports LBP has worsened over the last few days. Pt is unable to fully stand upright.   Dx imaging: 04/24/22 L-spine XR  Pertinent review of systems: No fevers or chills  Relevant historical information: Chronic kidney disease.  Diabetes.  History of liver transplant.   Exam:  BP (!) 166/96   Pulse (!) 110   Ht '5\' 4"'$  (1.626 m)   Wt 187 lb (84.8 kg)   SpO2 97%   BMI 32.10 kg/m  General: Well Developed, well nourished, and in no acute distress.   MSK: L-spine: Normal appearing Nontender midline.  Decreased lumbar motion.  Pain with extension. Lower extremity strength is intact.    Lab and Radiology Results EXAM: LUMBAR SPINE - 2-3 VIEW   COMPARISON:  11/21/2019.   FINDINGS: There is no evidence of lumbar spine fracture. There is mild anterolisthesis at L4-L5. Multilevel intervertebral disc space narrowing, degenerative endplate changes, and facet arthropathy, most pronounced at L4-L5. Aortic atherosclerosis.   IMPRESSION: 1. No acute fracture. 2. Multilevel degenerative changes, most pronounced at L4-L5.     Electronically Signed   By: Brett Fairy M.D.   On: 04/26/2022 00:29 I, [name], personally (independently) visualized and performed the interpretation of the images attached in this note.      Assessment and Plan: 61 y.o. female with low back pain with worsening lumbar radiculopathy left L5 dermatomal pattern.  Plan for MRI.  Her pain is escalated severely.  MRI should be for facet injection and epidural steroid injection planning.  Additionally MRI should be helpful to rule out compression  fracture or other etiology not well-seen on lumbar spine x-ray from last week. Oxycodone refilled.  PDMP not reviewed this encounter. Orders Placed This Encounter  Procedures   MR Lumbar Spine Wo Contrast    Standing Status:   Future    Standing Expiration Date:   05/01/2023    Order Specific Question:   What is the patient's sedation requirement?    Answer:   No Sedation    Order Specific Question:   Does the patient have a pacemaker or implanted devices?    Answer:   No    Order Specific Question:   Preferred imaging location?    Answer:   Product/process development scientist (table limit-350lbs)   Meds ordered this encounter  Medications   oxyCODONE-acetaminophen (PERCOCET/ROXICET) 5-325 MG tablet    Sig: Take 1 tablet by mouth every 8 (eight) hours as needed for severe pain.    Dispense:  15 tablet    Refill:  0     Discussed warning signs or symptoms. Please see discharge instructions. Patient expresses understanding.   The above documentation has been reviewed and is accurate and complete [name], M.D.

## 2022-04-30 NOTE — Telephone Encounter (Signed)
Patient is having continued pain.  Looking for next steps.  Appointment scheduled with Dr Georgina Snell today to discuss.

## 2022-04-30 NOTE — Progress Notes (Signed)
Lumbar spine x-ray shows multilevel arthritis changes

## 2022-04-30 NOTE — Patient Instructions (Signed)
Thank you for coming in today.   You should hear from MRI scheduling within 1 week. If you do not hear please let me know.    I will order the back injection once I get the MRI results.   Let me know if you have a problem.

## 2022-05-01 ENCOUNTER — Telehealth: Payer: Self-pay | Admitting: Family Medicine

## 2022-05-01 NOTE — Telephone Encounter (Signed)
Pt has MRI scheduled for 12/2, has had trouble in the past with these procedures and would like something for anxiety.  [facility] [address]

## 2022-05-03 MED ORDER — LORAZEPAM 0.5 MG PO TABS: ORAL_TABLET | ORAL | 0 refills | Status: DC

## 2022-05-03 NOTE — Telephone Encounter (Signed)
I sent in Ativan. This should help with anxity prior to MRI.  Please do not drive after taking this medicine.

## 2022-05-03 NOTE — Telephone Encounter (Signed)
Pt notified of RX and informed NOT to drive while taking.

## 2022-05-05 ENCOUNTER — Other Ambulatory Visit: Payer: Medicare Other

## 2022-05-06 ENCOUNTER — Ambulatory Visit (INDEPENDENT_AMBULATORY_CARE_PROVIDER_SITE_OTHER): Payer: Medicare Other

## 2022-05-06 DIAGNOSIS — M5442 Lumbago with sciatica, left side: Secondary | ICD-10-CM | POA: Diagnosis not present

## 2022-05-06 DIAGNOSIS — M5126 Other intervertebral disc displacement, lumbar region: Secondary | ICD-10-CM | POA: Diagnosis not present

## 2022-05-08 ENCOUNTER — Telehealth: Payer: Self-pay | Admitting: Family Medicine

## 2022-05-08 DIAGNOSIS — M5442 Lumbago with sciatica, left side: Secondary | ICD-10-CM

## 2022-05-08 MED ORDER — OXYCODONE-ACETAMINOPHEN 5-325 MG PO TABS: 1.0000 | ORAL_TABLET | Freq: Three times a day (TID) | ORAL | 0 refills | Status: DC | PRN

## 2022-05-08 NOTE — Progress Notes (Signed)
Lumbar spine MRI shows multiple areas that could cause back pain and a few areas that could produce the pain radiating down your leg.  Plan for a back injection with radiology. You should hear about scheduling soon.

## 2022-05-08 NOTE — Telephone Encounter (Signed)
Plan for an epidural steroid injection  I called Cassandra [name] and reviewed the MRI and plan

## 2022-05-08 NOTE — Telephone Encounter (Signed)
Patient called following up on the results from her MRI. She said that she is in a lot of pain and if out of medication as well. She would like to know what next steps are and if Dr Georgina Snell is able to prescribe something to help?

## 2022-05-10 ENCOUNTER — Ambulatory Visit (INDEPENDENT_AMBULATORY_CARE_PROVIDER_SITE_OTHER): Payer: Medicare Other | Admitting: Family Medicine

## 2022-05-10 ENCOUNTER — Encounter: Payer: Self-pay | Admitting: Family Medicine

## 2022-05-10 VITALS — BP 110/64 | HR 94 | Temp 97.9°F | Ht 64.0 in | Wt 188.4 lb

## 2022-05-10 DIAGNOSIS — D849 Immunodeficiency, unspecified: Secondary | ICD-10-CM

## 2022-05-10 DIAGNOSIS — Z0000 Encounter for general adult medical examination without abnormal findings: Secondary | ICD-10-CM | POA: Diagnosis not present

## 2022-05-10 DIAGNOSIS — N1831 Chronic kidney disease, stage 3a: Secondary | ICD-10-CM

## 2022-05-10 DIAGNOSIS — M48061 Spinal stenosis, lumbar region without neurogenic claudication: Secondary | ICD-10-CM | POA: Diagnosis not present

## 2022-05-10 DIAGNOSIS — Z789 Other specified health status: Secondary | ICD-10-CM | POA: Diagnosis not present

## 2022-05-10 DIAGNOSIS — Z944 Liver transplant status: Secondary | ICD-10-CM

## 2022-05-10 DIAGNOSIS — G729 Myopathy, unspecified: Secondary | ICD-10-CM

## 2022-05-10 DIAGNOSIS — D573 Sickle-cell trait: Secondary | ICD-10-CM

## 2022-05-10 DIAGNOSIS — E119 Type 2 diabetes mellitus without complications: Secondary | ICD-10-CM

## 2022-05-10 DIAGNOSIS — M4802 Spinal stenosis, cervical region: Secondary | ICD-10-CM

## 2022-05-10 DIAGNOSIS — D126 Benign neoplasm of colon, unspecified: Secondary | ICD-10-CM

## 2022-05-10 DIAGNOSIS — M170 Bilateral primary osteoarthritis of knee: Secondary | ICD-10-CM

## 2022-05-10 DIAGNOSIS — E782 Mixed hyperlipidemia: Secondary | ICD-10-CM | POA: Diagnosis not present

## 2022-05-10 LAB — CBC WITH DIFFERENTIAL/PLATELET
Basophils Absolute: 0 10*3/uL (ref 0.0–0.1)
Basophils Relative: 0.6 % (ref 0.0–3.0)
Eosinophils Absolute: 0.2 10*3/uL (ref 0.0–0.7)
Eosinophils Relative: 2.8 % (ref 0.0–5.0)
HCT: 41.1 % (ref 36.0–46.0)
Hemoglobin: 13.8 g/dL (ref 12.0–15.0)
Lymphocytes Relative: 28.4 % (ref 12.0–46.0)
Lymphs Abs: 1.6 10*3/uL (ref 0.7–4.0)
MCHC: 33.5 g/dL (ref 30.0–36.0)
MCV: 88.1 fl (ref 78.0–100.0)
Monocytes Absolute: 0.6 10*3/uL (ref 0.1–1.0)
Monocytes Relative: 10.2 % (ref 3.0–12.0)
Neutro Abs: 3.3 10*3/uL (ref 1.4–7.7)
Neutrophils Relative %: 58 % (ref 43.0–77.0)
Platelets: 187 10*3/uL (ref 150.0–400.0)
RBC: 4.66 Mil/uL (ref 3.87–5.11)
RDW: 13.9 % (ref 11.5–15.5)
WBC: 5.8 10*3/uL (ref 4.0–10.5)

## 2022-05-10 LAB — LIPID PANEL
Cholesterol: 247 mg/dL — ABNORMAL HIGH (ref 0–200)
HDL: 54.1 mg/dL (ref >39.00)
NonHDL: 192.83
Total CHOL/HDL Ratio: 5
Triglycerides: 220 mg/dL — ABNORMAL HIGH (ref 0.0–149.0)
VLDL: 44 mg/dL — ABNORMAL HIGH (ref 0.0–40.0)

## 2022-05-10 LAB — COMPREHENSIVE METABOLIC PANEL
ALT: 66 U/L — ABNORMAL HIGH (ref 0–35)
AST: 44 U/L — ABNORMAL HIGH (ref 0–37)
Albumin: 4 g/dL (ref 3.5–5.2)
Alkaline Phosphatase: 106 U/L (ref 39–117)
BUN: 17 mg/dL (ref 6–23)
CO2: 25 mEq/L (ref 19–32)
Calcium: 9.1 mg/dL (ref 8.4–10.5)
Chloride: 103 mEq/L (ref 96–112)
Creatinine, Ser: 1.16 mg/dL (ref 0.40–1.20)
GFR: 50.72 mL/min — ABNORMAL LOW (ref >60.00)
Glucose, Bld: 111 mg/dL — ABNORMAL HIGH (ref 70–99)
Potassium: 4 mEq/L (ref 3.5–5.1)
Sodium: 136 mEq/L (ref 135–145)
Total Bilirubin: 0.6 mg/dL (ref 0.2–1.2)
Total Protein: 6.6 g/dL (ref 6.0–8.3)

## 2022-05-10 LAB — VITAMIN D 25 HYDROXY (VIT D DEFICIENCY, FRACTURES): VITD: 35.96 ng/mL (ref 30.00–100.00)

## 2022-05-10 LAB — TSH: TSH: 0.81 u[IU]/mL (ref 0.35–5.50)

## 2022-05-10 LAB — LDL CHOLESTEROL, DIRECT: Direct LDL: 152 mg/dL

## 2022-05-10 LAB — HEMOGLOBIN A1C: Hgb A1c MFr Bld: 6.4 % (ref 4.6–6.5)

## 2022-05-10 NOTE — Progress Notes (Signed)
Subjective  Chief Complaint  Patient presents with   Annual Exam    HPI: Cassandra [name] is a 61 y.o. female who presents to [facility] today for a Female Wellness Visit. She also has the concerns and/or needs as listed above in the chief complaint. These will be addressed in addition to the Health Maintenance Visit.   Wellness Visit: annual visit with health maintenance review and exam without Pap  HM: screens are current. Declines flu vaccine. Due eye exam 07/2022 Chronic disease f/u and/or acute problem visit: (deemed necessary to be done in addition to the wellness visit): DM diet controlled; feels eating/diet is stable and no sxs of hyperglycemia. Rare soda now.  Liver tx status on chronic immunosuppressants. Stable.  HLD but intolerant to zetia and statins. Tries to eat low fat Low back pain and hip pain: reviewed multiple notes from SM, Dr. Georgina Snell, and xray and MRI reports; has diffuse djd, impingement and spinal stenosis. Pain persists. No new sxs.  Right knee djd s/p arthrocentesis and steroid injection recently by me; doing better overall. Has more pain if overdoes it.   Assessment  1. Annual physical exam   2. Spinal stenosis of lumbar region without neurogenic claudication   3. History of liver transplant ([place])   4. Primary osteoarthritis of knees, bilateral   5. Adenomatous polyp of colon, unspecified part of colon   6. Mixed hyperlipidemia   7. Immunosuppressed status ([place])   8. Chronic kidney disease (CKD) stage G3a/A1, moderately decreased glomerular filtration rate (GFR) between 45-59 mL/min/1.73 square meter and albuminuria creatinine ratio less than 30 mg/g (HCC)   9. Diet-controlled type 2 diabetes mellitus ([place])   10. Cervical stenosis of spine   11. AS (sickle cell trait) (HCC)   12. Myopathy   13. Statin intolerance      Plan  Female Wellness Visit: Age appropriate Health Maintenance and Prevention measures were discussed  with patient. Included topics are cancer screening recommendations, ways to keep healthy (see AVS) including dietary and exercise recommendations, regular eye and dental care, use of seat belts, and avoidance of moderate alcohol use and tobacco use. Screens are current BMI: discussed patient's BMI and encouraged positive lifestyle modifications to help get to or maintain a target BMI. HM needs and immunizations were addressed and ordered. See below for orders. See HM and immunization section for updates. Declines flu vaccine Routine labs and screening tests ordered including cmp, cbc and lipids where appropriate. Discussed recommendations regarding Vit D and calcium supplementation (see AVS)  Chronic disease management visit and/or acute problem visit: Chronic problems reviewed as noted above; Recheck A1c for diet controlled diabetes. Not on ace (neg urine mac ratio), normotensive and not on statin due to intolerance. Rec healthy diet Monitoring lipids.  Monitoring CKD; avoid nsaids. Bp is normal.  Back pain is symptomatic; for epidural steroid injection with sm soon; hopefully will help.  Monitor cbc Knee oa is currently improved.   Follow up: 6 mo for recheck  Orders Placed This Encounter  Procedures   CBC with Differential/Platelet   Comprehensive metabolic panel   Hemoglobin A1c   Lipid panel   TSH   VITAMIN D 25 Hydroxy (Vit-D Deficiency, Fractures)   No orders of the defined types were placed in this encounter.     Body mass index is 32.34 kg/m. Wt Readings from Last 3 Encounters:  05/10/22 188 lb 6.4 oz (85.5 kg)  04/30/22 187 lb (84.8 kg)  04/24/22 186 lb (84.4 kg)     Patient Active Problem List   Diagnosis Date Noted   Lumbar spinal stenosis 05/10/2022    Priority: High   Diet-controlled type 2 diabetes mellitus ([place]) 08/28/2021    Priority: High    Neg urine a/c ratio 08/2021, not on ACE.     Chronic kidney disease (CKD) stage G3a/A1, moderately decreased  glomerular filtration rate (GFR) between 45-59 mL/min/1.73 square meter and albuminuria creatinine ratio less than 30 mg/g (HCC) 03/01/2021    Priority: High   Statin intolerance 05/13/2018    Priority: High   Primary insomnia 11/04/2017    Priority: High   Immunosuppressed status ([place]) 10/07/2017    Priority: High   Adenomatous polyp of colon 09/28/2017    Priority: High    Per [facility] found in 2009; followed up in 2011.    Mixed hyperlipidemia 03/10/2013    Priority: High   Primary osteoarthritis of knees, bilateral 03/01/2013    Priority: High    Dr. Michaelle Birks saw in 08/2016 for pain and was considering Synvisc.  High risk for total knee due to immune suppressant post liver transplant. [facility] 2019: severe OA right knee with recurrent effusions. S/p synvisc w/o relief. Holding off on replacement.     Chronic back pain 10/13/2012    Priority: High   History of liver transplant ([place]) 02/22/2009    Priority: High    [facility]:due to AIH with cirrhosis.  1992.     Migraine with aura and without status migrainosus, not intractable 07/03/2018    Priority: Medium    Lumbar facet arthropathy 12/27/2017    Priority: Medium    Cervical stenosis of spine 01/25/2017    Priority: Medium    Fibromyalgia 08/24/2013    Priority: Medium    Hx of tuberculosis 10/13/2012    Priority: Medium    Anxiety disorder 09/16/2007    Priority: Medium     Has failed prozac, elavil, zoloft in past.  Used nightly xanax since 2000; weaned 2019.     AS (sickle cell trait) ([place]) 06/29/2019    Priority: Low   Cervical facet joint syndrome 03/28/2018   Spondylosis of cervical region without myelopathy or radiculopathy 03/28/2018   Health Maintenance  Topic Date Due   Medicare Annual Wellness (AWV)  07/06/2022   MAMMOGRAM  06/06/2022   OPHTHALMOLOGY EXAM  07/19/2022   HEMOGLOBIN A1C  08/15/2022   Diabetic kidney evaluation - Urine ACR  08/29/2022   FOOT EXAM  11/29/2022   Diabetic kidney  evaluation - GFR measurement  01/23/2023   DTaP/Tdap/Td (4 - Td or Tdap) 05/04/2030   COLONOSCOPY (Pts 45-56yr Insurance coverage will need to be confirmed)  01/25/2031   Hepatitis C Screening  Completed   HIV Screening  Completed   HPV VACCINES  Aged Out   INFLUENZA VACCINE  Discontinued   COVID-19 Vaccine  Discontinued   Zoster Vaccines- Shingrix  Discontinued   Immunization History  Administered Date(s) Administered   Hepatitis A, Adult 09/27/2016, 09/26/2017   Influenza Split 03/05/2012   Influenza, Quadrivalent, Recombinant, Inj, Pf 04/29/2013, 03/28/2016   Influenza, Seasonal, Injecte, Preservative Fre 03/02/2014, 02/28/2015   Influenza,inj,Quad PF,6+ Mos 03/28/2016   Influenza-Unspecified 05/26/2013, 03/02/2014, 02/28/2015, 03/28/2016   PFIZER Comirnaty(Gray Top)Covid-19 Tri-Sucrose Vaccine 08/27/2019, 09/17/2019, 07/19/2020   PFIZER(Purple Top)SARS-COV-2 Vaccination 08/27/2019, 09/17/2019   PNEUMOCOCCAL CONJUGATE-20 09/07/2021   Pneumococcal Conjugate-13 09/22/2015   Pneumococcal Polysaccharide-23 09/27/2016   Tdap 05/17/2008, 12/03/2010   Tetanus 05/04/2020   We updated  and reviewed the patient's past history in detail and it is documented below. Allergies: Patient is allergic to erythromycin, ezetimibe, other, rofecoxib, statins, zonisamide, carvacrol, elavil [amitriptyline], metoprolol, and nsaids. Past Medical History Patient  has a past medical history of Anxiety, AS (sickle cell trait) ([place]) (06/29/2019), Autoimmune hepatitis ([place]), Chronic renal insufficiency, Cirrhosis ([place]), COLONIC POLYPS, ADENOMATOUS, HX OF (05/05/2008), Depression, External hemorrhoids, Fibromyalgia, GERD (gastroesophageal reflux disease), Hyperlipidemia, Internal hemorrhoids, Iron deficiency anemia, Liver replaced by transplant ([place]) (02/22/2009), Migraine, 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, Ruptured disc, cervical, and Statin intolerance (05/13/2018). Past Surgical History Patient  has a past  surgical history that includes Liver transplant (1992); Knee arthroscopy (Right, 2010); Rotator cuff repair (Left, 2010); and Abdominal hysterectomy (2010). Family History: Patient family history includes Alzheimer's disease in her mother; Cancer in her sister; Dementia in her mother; Diabetes in her brother and mother; Gout in her mother; Heart disease in her sister; Hypertension in her mother and sister; Stroke in her mother. Social History:  Patient  reports that she has never smoked. She has never used smokeless tobacco. She reports that she does not drink alcohol and does not use drugs.  Review of Systems: Constitutional: negative for fever or malaise Ophthalmic: negative for photophobia, double vision or loss of vision Cardiovascular: negative for chest pain, dyspnea on exertion, or new LE swelling Respiratory: negative for SOB or persistent cough Gastrointestinal: negative for abdominal pain, change in bowel habits or melena Genitourinary: negative for dysuria or gross hematuria, no abnormal uterine bleeding or disharge Musculoskeletal: negative for new gait disturbance or muscular weakness Integumentary: negative for new or persistent rashes, no breast lumps Neurological: negative for TIA or stroke symptoms Psychiatric: negative for SI or delusions Allergic/Immunologic: negative for hives  Patient Care Team    Relationship Specialty Notifications Start End  Leamon Arnt, MD PCP - General Family Medicine  10/07/17   Mauri Pole, MD Consulting Physician Gastroenterology  11/04/17   Lavonia Drafts  Pain Medicine  11/04/17   Melvenia Beam, MD Consulting Physician Neurology  05/22/19   Teena Irani, MD Consulting Physician Internal Medicine  05/22/19   Lambert Keto, PA-C  Physician Assistant  05/22/19     Objective  Vitals: BP 110/64   Pulse 94   Temp 97.9 F (36.6 C)   Ht _0  (1.626 m)   Wt 188 lb 6.4 oz (85.5 kg)   SpO2 99%   BMI 32.34 kg/m  General:  Well  developed, well nourished, no acute distress  Psych:  Alert and orientedx3,normal mood and affect HEENT:  Normocephalic, atraumatic, non-icteric sclera,  supple neck without adenopathy, mass or thyromegaly Cardiovascular:  Normal S1, S2, RRR without gallop, rub or murmur Respiratory:  Good breath sounds bilaterally, CTAB with normal respiratory effort Gastrointestinal: normal bowel sounds, soft, non-tender, no noted masses. No HSM MSK: no deformities, contusions. J Skin:  Warm, no rashes or suspicious lesions noted Neurologic:    Mental status is normal. Gross motor and sensory exams are normal. Normal gait. Fine tremor R>L present   Commons side effects, risks, benefits, and alternatives for medications and treatment plan prescribed today were discussed, and the patient expressed understanding of the given instructions. Patient is instructed to call or message via MyChart if he/she has any questions or concerns regarding our treatment plan. No barriers to understanding were identified. We discussed Red Flag symptoms and signs in detail. Patient expressed understanding regarding what to do in case of urgent or emergency type symptoms.  Medication  list was reconciled, printed and provided to the patient in AVS. Patient instructions and summary information was reviewed with the patient as documented in the AVS. This note was prepared with assistance of Dragon voice recognition software. Occasional wrong-word or sound-a-like substitutions may have occurred due to the inherent limitations of voice recognition software

## 2022-05-10 NOTE — Patient Instructions (Signed)
Please return in 6 months for recheck.   Your eye exam will be due in February so call and get it scheduled now please  I will release your lab results to you on your MyChart account with further instructions. You may see the results before I do, but when I review them I will send you a message with my report or have my assistant call you if things need to be discussed. Please reply to my message with any questions. Thank you!   If you have any questions or concerns, please don't hesitate to send me a message via MyChart or call the office at [phone]. Thank you for visiting with [place] today! It's our pleasure caring for you.

## 2022-05-18 ENCOUNTER — Ambulatory Visit
Admission: RE | Admit: 2022-05-18 | Discharge: 2022-05-18 | Disposition: A | Discharge status: Home / Self Care | Payer: Medicare Other | Source: Ambulatory Visit | Attending: Family Medicine | Admitting: Family Medicine

## 2022-05-18 ENCOUNTER — Telehealth: Payer: Self-pay | Admitting: Family Medicine

## 2022-05-18 ENCOUNTER — Other Ambulatory Visit: Payer: Self-pay | Admitting: Family Medicine

## 2022-05-18 DIAGNOSIS — M5442 Lumbago with sciatica, left side: Secondary | ICD-10-CM

## 2022-05-18 DIAGNOSIS — M549 Dorsalgia, unspecified: Secondary | ICD-10-CM | POA: Diagnosis not present

## 2022-05-18 MED ORDER — IOPAMIDOL (ISOVUE-M 200) INJECTION 41%
1.0000 mL | Freq: Once | INTRAMUSCULAR | Status: AC
  Administered 2022-05-18: 1 mL via EPIDURAL

## 2022-05-18 MED ORDER — METHYLPREDNISOLONE ACETATE 40 MG/ML INJ SUSP (RADIOLOG
80.0000 mg | Freq: Once | INTRAMUSCULAR | Status: AC
  Administered 2022-05-18: 80 mg via EPIDURAL

## 2022-05-18 NOTE — Telephone Encounter (Signed)
Discussed with patient about care.  No refill at this time.  Will wait til Monday  If worsen over weekend patient is to go to the emergency room.

## 2022-05-18 NOTE — Telephone Encounter (Signed)
Spoke with patient. She is worried that she is going to be in a lot of pain. Reassured her that it is not typical to be in more pain after epidural. Patient would like to know what she should do if she is in more pain following the epidural.

## 2022-05-18 NOTE — Telephone Encounter (Signed)
Cassandra [name] pt, had epidural today. Imaging staff noted that pt may experience reoccurrence of her pain after numbing med wears off. Dr. Georgina [name] had been prescribing pain meds.  I informed pt of Dr. Clovis Riley absence and that rx pain meds are not the norm after epidural. Could you review her chart to provide reassurance for pt?

## 2022-05-18 NOTE — Discharge Instructions (Signed)

## 2022-05-30 NOTE — Progress Notes (Signed)
I, Philbert Riser, LAT, ATC acting as a scribe for [name], MD.  Cassandra [name] is a 61 y.o. female who presents to [facility] today for f/u LBP after ESI on 05/18/22. Pt was last seen by Dr. Denyse Amass on  04/30/22 and a L-spine MRI was ordered and he oxycodone was refilled. Based on MRI findings, lumbar ESI was ordered. Today, pt reports radiating pain into the lateral aspect of her L lower leg. Pain is nagging.  She had about a day of her knee from the left L5 nerve root block and transforaminal epidural steroid injection performed on December 15.  She has a relevant history for liver transplant managed by the [facility] transplant team.  If she were to need spine surgery she would like it to be done with [facility] providers as they have better coordination with her transplant team.  Dx imaging: 05/06/22 L-spine MRI 04/24/22 L-spine XR   Pertinent review of systems: No fevers or chills  Relevant historical information: History of liver transplant.  History of diabetes.  Immunosuppressed with low-dose steroids.   Exam:  BP (!) 150/90   Pulse 96   Ht 5\' 4"  (1.626 m)   Wt 187 lb (84.8 kg)   SpO2 99%   BMI 32.10 kg/m  General: Well Developed, well nourished, and in no acute distress.   MSK: L-spine: Decreased lumbar motion pain with extension.  Lower extremity strength is intact.    Lab and Radiology Results  EXAM: MRI LUMBAR SPINE WITHOUT CONTRAST   TECHNIQUE: Multiplanar, multisequence MR imaging of the lumbar spine was performed. No intravenous contrast was administered.   COMPARISON:  None Available.   FINDINGS: Segmentation:  Standard.   Alignment:  Grade 1 anterolisthesis L3 on L4 and L4 on L5.   Vertebrae: No acute fracture, evidence of discitis, or aggressive bone lesion.   Conus medullaris and cauda equina: Conus extends to the T12-L1 level. Conus and cauda equina appear normal.   Paraspinal and other soft tissues: No acute  paraspinal abnormality.   Disc levels:   Disc spaces: Degenerative disease with disc desiccation throughout the lumbar spine and disc height loss at L4-5.   T11-12: Right paracentral disc protrusion. Mild bilateral facet arthropathy. No foraminal or central canal stenosis.   T12-L1: No significant disc bulge. Mild bilateral facet arthropathy. No foraminal or central canal stenosis.   L1-L2: Mild broad-based disc bulge. Mild bilateral facet arthropathy. No foraminal or central canal stenosis.   L2-L3: Mild broad-based disc bulge. Mild bilateral facet arthropathy. No foraminal or central canal stenosis.   L3-L4: Broad-based disc bulge. Moderate bilateral facet arthropathy with ligamentum flavum infolding and a 3 mm left facet intraspinal synovial cyst impressing on the left lateral thecal sac. Moderate-severe spinal stenosis. Moderate left foraminal stenosis. Mild right foraminal stenosis.   L4-L5: Broad-based disc bulge. Moderate bilateral facet arthropathy. Moderate spinal stenosis. Moderate-severe left foraminal stenosis. Mild right foraminal stenosis.   L5-S1: Mild broad-based disc bulge. No foraminal or central canal stenosis.   IMPRESSION: 1. At L3-4 there is a broad-based disc bulge. Moderate bilateral facet arthropathy with ligamentum flavum infolding and a 3 mm left facet intraspinal synovial cyst impressing on the left lateral thecal sac. Moderate-severe spinal stenosis. Moderate left foraminal stenosis. Mild right foraminal stenosis. 2. At L4-5 there is a broad-based disc bulge. Moderate bilateral facet arthropathy. Moderate spinal stenosis. Moderate-severe left foraminal stenosis. Mild right foraminal stenosis. 3. No acute osseous injury of the lumbar spine.  Electronically Signed   By: Elige Ko M.D.   On: 05/08/2022 08:43 I, [name], personally (independently) visualized and performed the interpretation of the images attached in this  note.  EXAM: SELECTIVE NERVE ROOT BLOCK AND TRANSFORAMINAL EPIDURAL STEROID INJECTION UNDER FLUOROSCOPY   FLUOROSCOPY: Radiation Exposure Index (as provided by the fluoroscopic device): 13.9 mGy Kerma   TECHNIQUE: An appropriate skin entry site was determined under fluoroscopy. Operator donned sterile gloves and mask. Site was marked, prepped with Betadine, draped in usual sterile fashion, infiltrated locally with 1% lidocaine. A 22 gauge spinal needle was advanced to the superior ventral margin of the left L5-S1 neural foramen. Diagnostic injection of Isovue-M 200 showed partial outlining of the exiting nerve root as well as epidural extension of contrast, with no intravascular or subarachnoid component. 80 mg Depo-Medrol in 2 ml lidocaine 1% was administered. The patient tolerated procedure well, with no immediate complication.   IMPRESSION: 1. Technically successful left L5 selective nerve root block and transforaminal epidural steroid injection.     Electronically Signed   By: Richarda Overlie M.D.   On: 05/18/2022 14:43    Assessment and Plan: 61 y.o. female with lumbar radiculopathy.  Her symptoms are most consistent with the L5 nerve root pattern.  She had a left L5 nerve root block and transforaminal epidural steroid injection on December 15 which helped for about a day. Plan to retry an epidural steroid injection.  I advised the radiologist to consider a interlaminar injection as it is different and may work better or at least differently.  It is worth a try.  If this second shot does not help very much she may need a surgical consultation.  After discussion she would like to use [facility] spine surgeons which I think is reasonable.  Will arrange for that if needed.  To her [facility] transplant team would select orthopedic spine surgeons or neurosurgeons.  Oxycodone refilled  PDMP reviewed during this encounter. Orders Placed This Encounter  Procedures   DG INJECT  DIAG/THERA/INC NEEDLE/CATH/PLC EPI/LUMB/SAC W/IMG    Standing Status:   Future    Standing Expiration Date:   06/01/2023    Order Specific Question:   Reason for Exam (SYMPTOM  OR DIAGNOSIS REQUIRED)    Answer:   Left L5 symptoms. NRB and Tranforaminal not very helpful. Consider ?interlaminar. Level and technique per radiology    Order Specific Question:   Preferred Imaging Location?    Answer:   [id number]    Order Specific Question:   Radiology Contrast Protocol - do NOT remove file path    Answer:   [email or web address]   Meds ordered this encounter  Medications   oxyCODONE-acetaminophen (PERCOCET/ROXICET) 5-325 MG tablet    Sig: Take 1 tablet by mouth every 8 (eight) hours as needed for severe pain.    Dispense:  15 tablet    Refill:  0     Discussed warning signs or symptoms. Please see discharge instructions. Patient expresses understanding.   The above documentation has been reviewed and is accurate and complete [name], M.D.  Total encounter time 30 minutes including face-to-face time with the patient and, reviewing past medical record, and charting on the date of service.

## 2022-05-31 ENCOUNTER — Ambulatory Visit: Payer: Medicare Other | Admitting: Family Medicine

## 2022-05-31 VITALS — BP 150/90 | HR 96 | Ht 64.0 in | Wt 187.0 lb

## 2022-05-31 DIAGNOSIS — M5416 Radiculopathy, lumbar region: Secondary | ICD-10-CM

## 2022-05-31 MED ORDER — OXYCODONE-ACETAMINOPHEN 5-325 MG PO TABS: 1.0000 | ORAL_TABLET | Freq: Three times a day (TID) | ORAL | 0 refills | Status: DC | PRN

## 2022-05-31 NOTE — Patient Instructions (Addendum)
Thank you for coming in today.   It would be a good idea to get a CD made of the MRI pictures incase you go to [facility] for a surgical opinion.   Please call [facility] at [phone] to schedule your spine injection.    Let me know how this goes.   If this does not work well let me know. I think a surgical opinion with your team at [facility] would make sense.   You would need an orthopedic spine surgeon or neurosurgeon.

## 2022-06-06 NOTE — Discharge Instructions (Signed)

## 2022-06-07 ENCOUNTER — Ambulatory Visit
Admission: RE | Admit: 2022-06-07 | Discharge: 2022-06-07 | Disposition: A | Discharge status: Home / Self Care | Payer: Medicare Other | Source: Ambulatory Visit | Attending: Family Medicine | Admitting: Family Medicine

## 2022-06-07 DIAGNOSIS — M5416 Radiculopathy, lumbar region: Secondary | ICD-10-CM

## 2022-06-07 DIAGNOSIS — M4727 Other spondylosis with radiculopathy, lumbosacral region: Secondary | ICD-10-CM | POA: Diagnosis not present

## 2022-06-07 MED ORDER — IOPAMIDOL (ISOVUE-M 200) INJECTION 41%
1.0000 mL | Freq: Once | INTRAMUSCULAR | Status: AC
  Administered 2022-06-07: 1 mL via EPIDURAL

## 2022-06-07 MED ORDER — METHYLPREDNISOLONE ACETATE 40 MG/ML INJ SUSP (RADIOLOG
80.0000 mg | Freq: Once | INTRAMUSCULAR | Status: AC
  Administered 2022-06-07: 80 mg via EPIDURAL

## 2022-06-16 DIAGNOSIS — Z1231 Encounter for screening mammogram for malignant neoplasm of breast: Secondary | ICD-10-CM | POA: Diagnosis not present

## 2022-06-16 LAB — HM MAMMOGRAPHY

## 2022-06-19 ENCOUNTER — Encounter: Payer: Self-pay | Admitting: Family Medicine

## 2022-06-26 DIAGNOSIS — D849 Immunodeficiency, unspecified: Secondary | ICD-10-CM | POA: Diagnosis not present

## 2022-06-26 DIAGNOSIS — Z944 Liver transplant status: Secondary | ICD-10-CM | POA: Diagnosis not present

## 2022-06-27 LAB — HM DIABETES EYE EXAM

## 2022-07-09 DIAGNOSIS — R5383 Other fatigue: Secondary | ICD-10-CM | POA: Diagnosis not present

## 2022-07-09 DIAGNOSIS — Z944 Liver transplant status: Secondary | ICD-10-CM | POA: Diagnosis not present

## 2022-07-09 DIAGNOSIS — D849 Immunodeficiency, unspecified: Secondary | ICD-10-CM | POA: Diagnosis not present

## 2022-07-11 ENCOUNTER — Ambulatory Visit (INDEPENDENT_AMBULATORY_CARE_PROVIDER_SITE_OTHER): Payer: Medicare Other | Admitting: Family Medicine

## 2022-07-11 ENCOUNTER — Encounter: Payer: Self-pay | Admitting: Family Medicine

## 2022-07-11 VITALS — BP 122/74 | HR 74 | Temp 97.9°F | Ht 64.0 in | Wt 190.2 lb

## 2022-07-11 DIAGNOSIS — M1711 Unilateral primary osteoarthritis, right knee: Secondary | ICD-10-CM

## 2022-07-11 MED ORDER — TRIAMCINOLONE ACETONIDE 40 MG/ML IJ SUSP
40.0000 mg | Freq: Once | INTRAMUSCULAR | Status: AC
  Administered 2022-07-11: 40 mg via INTRA_ARTICULAR

## 2022-07-11 NOTE — Progress Notes (Signed)
Subjective  CC:  Chief Complaint  Patient presents with   Knee Pain    Pt stated that she has had Rt knee pain for the past 2 weeks. Pain is worse in the morning when getting up    HPI: Cassandra [name] is a 62 y.o. female who presents to the office today to address the problems listed above in the chief complaint.  She has OA of the  Bilateral knee(s) and is having pain that impedes her quality of life and activity level. See past notes for further documentation. No contraindications to steroids or Synvisc are identified. We have used alternative measures for pain control including tylenol, nsaids, ice, rest, and other prescribed analgesics with variable success. Over last 2-3 weeks she has had lots of travel (sister passed) and lots of walking which has exacerbated her right knee OA: now swollen and painul. Did well with last steroid injection given in November 2023 until now. No injury, fever.  I reviewed the patients updated PMH, FH, and SocHx.    Patient Active Problem List   Diagnosis Date Noted   Lumbar spinal stenosis 05/10/2022    Priority: High   Diet-controlled type 2 diabetes mellitus ([place]) 08/28/2021    Priority: High   Chronic kidney disease (CKD) stage G3a/A1, moderately decreased glomerular filtration rate (GFR) between 45-59 mL/min/1.73 square meter and albuminuria creatinine ratio less than 30 mg/g (HCC) 03/01/2021    Priority: High   Statin intolerance 05/13/2018    Priority: High   Primary insomnia 11/04/2017    Priority: High   Immunosuppressed status ([place]) 10/07/2017    Priority: High   Adenomatous polyp of colon 09/28/2017    Priority: High   Mixed hyperlipidemia 03/10/2013    Priority: High   Primary osteoarthritis of knees, bilateral 03/01/2013    Priority: High   Chronic back pain 10/13/2012    Priority: High   History of liver transplant ([place]) 02/22/2009    Priority: High   Migraine with aura and without status migrainosus, not intractable  07/03/2018    Priority: Medium    Lumbar facet arthropathy 12/27/2017    Priority: Medium    Cervical stenosis of spine 01/25/2017    Priority: Medium    Fibromyalgia 08/24/2013    Priority: Medium    Hx of tuberculosis 10/13/2012    Priority: Medium    Anxiety disorder 09/16/2007    Priority: Medium    AS (sickle cell trait) ([place]) 06/29/2019    Priority: Low   Cervical facet joint syndrome 03/28/2018   Spondylosis of cervical region without myelopathy or radiculopathy 03/28/2018   Current Meds  Medication Sig   cyclobenzaprine (FLEXERIL) 10 MG tablet TAKE 1 TABLET BY MOUTH THREE TIMES DAILY AS NEEDED   diclofenac Sodium (VOLTAREN) 1 % GEL Apply 4 g topically 4 (four) times daily as needed.   fluticasone (FLONASE) 50 MCG/ACT nasal spray SHAKE LIQUID AND USE 2 SPRAYS IN EACH NOSTRIL DAILY   levocetirizine (XYZAL) 5 MG tablet Take 1 tablet (5 mg total) by mouth every evening.   mycophenolate (CELLCEPT) 500 MG tablet Take 500 mg by mouth 2 (two) times daily.   olopatadine (PATANOL) 0.1 % ophthalmic solution INSTILL 1 DROP IN BOTH EYES TWICE DAILY   Omega-3 Fatty Acids (FISH OIL) 1000 MG CAPS Take 1 capsule (1,000 mg total) by mouth daily.   predniSONE (DELTASONE) 1 MG tablet Take 5 mg by mouth daily.   tacrolimus (PROGRAF) 1 MG capsule Take 2 capsules (2 mg total)  by mouth 2 (two) times daily.    Allergies: Patient is allergic to erythromycin, ezetimibe, other, rofecoxib, statins, zonisamide, carvacrol, elavil [amitriptyline], metoprolol, and nsaids.  Review of Systems: Constitutional: Negative for fever malaise or anorexia Cardiovascular: negative for chest pain Respiratory: negative for SOB or persistent cough Gastrointestinal: negative for abdominal pain  Objective  Vitals: BP 122/74   Pulse 74   Temp 97.9 F (36.6 C)   Ht '5\' 4"'$  (1.626 m)   Wt 190 lb 3.2 oz (86.3 kg)   SpO2 99%   BMI 32.65 kg/m  General: no acute distress , A&Ox3 Cardiovascular:  RRR without murmur   Knee:  Right knee effusion w/o erythema or warmth. Crepitus. Antalgic gait Skin:  Warm, no rashes  Procedure note for Knee Joint Aspiration and/or Injection:  Indications: pain relief, failed conservative therapies  Knee Arthrocentesis with Aspiration and Injection Procedure Note  Pre-operative Diagnosis: right OA with effusion  Post-operative Diagnosis: same  Indications: Symptomatic relief of large effusion and Symptom relief from osteoarthritis  Anesthesia: Lidocaine 1% without epinephrine without added sodium bicarbonate  Procedure Details   Verbal consent was obtained for the procedure. Universal time out taken.  The Knee joint was prepped with alcohol and an 18 gauge needle was inserted into the joint from the lateral approach. 32 ml of clear yellow fluid was removed from the joint and discarded. Four ml 1% lidocaine and one ml of triamcinolone (KENALOG) '40mg'$ /ml was then injected into the joint through the same needle. The needle was removed and the area cleansed and dressed.  Complications:  None; patient tolerated the procedure well.   Assessment  1. Primary osteoarthritis of right knee      Plan  S/p intra-articular knee joint aspiration and steroid injection:  Routine post procedure care discussed. Rest, Ice, Nsaids as needed and ROM exercises recommended. F/u care printed out for patient in AVS.  Discussed urgent f/u if develops increased pain, redness or fever.   Follow up: Return in about 5 months (around 12/09/2022) for recheck.   Commons side effects, risks, benefits, and alternatives for medications and treatment plan prescribed today were discussed, and the patient expressed understanding of the given instructions. Patient is instructed to call or message via MyChart if he/she has any questions or concerns regarding our treatment plan. No barriers to understanding were identified. We discussed Red Flag symptoms and signs in detail. Patient expressed understanding  regarding what to do in case of urgent or emergency type symptoms.  Medication list was reconciled, printed and provided to the patient in AVS. Patient instructions and summary information was reviewed with the patient as documented in the AVS. This note was prepared with assistance of Dragon voice recognition software. Occasional wrong-word or sound-a-like substitutions may have occurred due to the inherent limitations of voice recognition software  No orders of the defined types were placed in this encounter.  Meds ordered this encounter  Medications   triamcinolone acetonide (KENALOG-40) injection 40 mg

## 2022-07-11 NOTE — Patient Instructions (Signed)
Please return in 5-6 mo for recheck  If you have any questions or concerns, please don't hesitate to send me a message via MyChart or call the office at [phone]. Thank you for visiting with [place] today! It's our pleasure caring for you.

## 2022-07-12 ENCOUNTER — Ambulatory Visit (INDEPENDENT_AMBULATORY_CARE_PROVIDER_SITE_OTHER): Payer: Medicare Other

## 2022-07-12 DIAGNOSIS — Z0000 Encounter for general adult medical examination without abnormal findings: Secondary | ICD-10-CM | POA: Diagnosis not present

## 2022-07-12 NOTE — Patient Instructions (Signed)
Cassandra [name] , Thank you for taking time to come for your Medicare Wellness Visit. I appreciate your ongoing commitment to your health goals. Please review the following plan we discussed and let me know if I can assist you in the future.   These are the goals we discussed:  Goals      Patient Stated     Lose weight and better diet      Patient Stated     Get healthier          This is a list of the screening recommended for you and due dates:  Health Maintenance  Topic Date Due   Eye exam for diabetics  07/19/2022   Yearly kidney health urinalysis for diabetes  08/29/2022   Hemoglobin A1C  11/09/2022   Complete foot exam   11/29/2022   Yearly kidney function blood test for diabetes  05/11/2023   Mammogram  06/17/2023   Medicare Annual Wellness Visit  07/13/2023   DTaP/Tdap/Td vaccine (4 - Td or Tdap) 05/04/2030   Colon Cancer Screening  01/25/2031   Hepatitis C Screening: USPSTF Recommendation to screen - Ages 18-79 yo.  Completed   HIV Screening  Completed   HPV Vaccine  Aged Out   Flu Shot  Discontinued   COVID-19 Vaccine  Discontinued   Zoster (Shingles) Vaccine  Discontinued    Advanced directives: Advance directive discussed with you today. Even though you declined this today please call our office should you change your mind and we can give you the proper paperwork for you to fill out.  Conditions/risks identified: get healthier   Next appointment: Follow up in one year for your annual wellness visit.   Preventive Care 40-64 Years, Female Preventive care refers to lifestyle choices and visits with your health care provider that can promote health and wellness. What does preventive care include? A yearly physical exam. This is also called an annual well check. Dental exams once or twice a year. Routine eye exams. Ask your health care provider how often you should have your eyes checked. Personal lifestyle choices, including: Daily care of your teeth and  gums. Regular physical activity. Eating a healthy diet. Avoiding tobacco and drug use. Limiting alcohol use. Practicing safe sex. Taking low-dose aspirin daily starting at age 8. Taking vitamin and mineral supplements as recommended by your health care provider. What happens during an annual well check? The services and screenings done by your health care provider during your annual well check will depend on your age, overall health, lifestyle risk factors, and family history of disease. Counseling  Your health care provider may ask you questions about your: Alcohol use. Tobacco use. Drug use. Emotional well-being. Home and relationship well-being. Sexual activity. Eating habits. Work and work Statistician. Method of birth control. Menstrual cycle. Pregnancy history. Screening  You may have the following tests or measurements: Height, weight, and BMI. Blood pressure. Lipid and cholesterol levels. These may be checked every 5 years, or more frequently if you are over 18 years old. Skin check. Lung cancer screening. You may have this screening every year starting at age 15 if you have a 30-pack-year history of smoking and currently smoke or have quit within the past 15 years. Fecal occult blood test (FOBT) of the stool. You may have this test every year starting at age 49. Flexible sigmoidoscopy or colonoscopy. You may have a sigmoidoscopy every 5 years or a colonoscopy every 10 years starting at age 47. Hepatitis C blood test. Hepatitis  B blood test. Sexually transmitted disease (STD) testing. Diabetes screening. This is done by checking your blood sugar (glucose) after you have not eaten for a while (fasting). You may have this done every 1-3 years. Mammogram. This may be done every 1-2 years. Talk to your health care provider about when you should start having regular mammograms. This may depend on whether you have a family history of breast cancer. BRCA-related cancer  screening. This may be done if you have a family history of breast, ovarian, tubal, or peritoneal cancers. Pelvic exam and Pap test. This may be done every 3 years starting at age 33. Starting at age 73, this may be done every 5 years if you have a Pap test in combination with an HPV test. Bone density scan. This is done to screen for osteoporosis. You may have this scan if you are at high risk for osteoporosis. Discuss your test results, treatment options, and if necessary, the need for more tests with your health care provider. Vaccines  Your health care provider may recommend certain vaccines, such as: Influenza vaccine. This is recommended every year. Tetanus, diphtheria, and acellular pertussis (Tdap, Td) vaccine. You may need a Td booster every 10 years. Zoster vaccine. You may need this after age 24. Pneumococcal 13-valent conjugate (PCV13) vaccine. You may need this if you have certain conditions and were not previously vaccinated. Pneumococcal polysaccharide (PPSV23) vaccine. You may need one or two doses if you smoke cigarettes or if you have certain conditions. Talk to your health care provider about which screenings and vaccines you need and how often you need them. This information is not intended to replace advice given to you by your health care provider. Make sure you discuss any questions you have with your health care provider. Document Released: 06/17/2015 Document Revised: 02/08/2016 Document Reviewed: 03/22/2015 Elsevier Interactive Patient Education  2017 [place] Prevention in the Home Falls can cause injuries. They can happen to people of all ages. There are many things you can do to make your home safe and to help prevent falls. What can I do on the outside of my home? Regularly fix the edges of walkways and driveways and fix any cracks. Remove anything that might make you trip as you walk through a door, such as a raised step or threshold. Trim any  bushes or trees on the path to your home. Use bright outdoor lighting. Clear any walking paths of anything that might make someone trip, such as rocks or tools. Regularly check to see if handrails are loose or broken. Make sure that both sides of any steps have handrails. Any raised decks and porches should have guardrails on the edges. Have any leaves, snow, or ice cleared regularly. Use sand or salt on walking paths during winter. Clean up any spills in your garage right away. This includes oil or grease spills. What can I do in the bathroom? Use night lights. Install grab bars by the toilet and in the tub and shower. Do not use towel bars as grab bars. Use non-skid mats or decals in the tub or shower. If you need to sit down in the shower, use a plastic, non-slip stool. Keep the floor dry. Clean up any water that spills on the floor as soon as it happens. Remove soap buildup in the tub or shower regularly. Attach bath mats securely with double-sided non-slip rug tape. Do not have throw rugs and other things on the floor that can  make you trip. What can I do in the bedroom? Use night lights. Make sure that you have a light by your bed that is easy to reach. Do not use any sheets or blankets that are too big for your bed. They should not hang down onto the floor. Have a firm chair that has side arms. You can use this for support while you get dressed. Do not have throw rugs and other things on the floor that can make you trip. What can I do in the kitchen? Clean up any spills right away. Avoid walking on wet floors. Keep items that you use a lot in easy-to-reach places. If you need to reach something above you, use a strong step stool that has a grab bar. Keep electrical cords out of the way. Do not use floor polish or wax that makes floors slippery. If you must use wax, use non-skid floor wax. Do not have throw rugs and other things on the floor that can make you trip. What can I do  with my stairs? Do not leave any items on the stairs. Make sure that there are handrails on both sides of the stairs and use them. Fix handrails that are broken or loose. Make sure that handrails are as long as the stairways. Check any carpeting to make sure that it is firmly attached to the stairs. Fix any carpet that is loose or worn. Avoid having throw rugs at the top or bottom of the stairs. If you do have throw rugs, attach them to the floor with carpet tape. Make sure that you have a light switch at the top of the stairs and the bottom of the stairs. If you do not have them, ask someone to add them for you. What else can I do to help prevent falls? Wear shoes that: Do not have high heels. Have rubber bottoms. Are comfortable and fit you well. Are closed at the toe. Do not wear sandals. If you use a stepladder: Make sure that it is fully opened. Do not climb a closed stepladder. Make sure that both sides of the stepladder are locked into place. Ask someone to hold it for you, if possible. Clearly mark and make sure that you can see: Any grab bars or handrails. First and last steps. Where the edge of each step is. Use tools that help you move around (mobility aids) if they are needed. These include: Canes. Walkers. Scooters. Crutches. Turn on the lights when you go into a dark area. Replace any light bulbs as soon as they burn out. Set up your furniture so you have a clear path. Avoid moving your furniture around. If any of your floors are uneven, fix them. If there are any pets around you, be aware of where they are. Review your medicines with your doctor. Some medicines can make you feel dizzy. This can increase your chance of falling. Ask your doctor what other things that you can do to help prevent falls. This information is not intended to replace advice given to you by your health care provider. Make sure you discuss any questions you have with your health care  provider. Document Released: 03/17/2009 Document Revised: 10/27/2015 Document Reviewed: 06/25/2014 Elsevier Interactive Patient Education  2017 [employer].

## 2022-07-12 NOTE — Progress Notes (Signed)
I connected with  [name] on 07/12/22 by a audio enabled telemedicine application and verified that I am speaking with the correct person using two identifiers.  Patient Location: Home  Provider Location: Office/Clinic  I discussed the limitations of evaluation and management by telemedicine. The patient expressed understanding and agreed to proceed.   Patient Medicare AWV questionnaire was completed by the patient on 07/08/22; I have confirmed that all information answered by patient is correct and no changes since this date.             Subjective:   Cassandra [name] is a 62 y.o. female who presents for Medicare Annual (Subsequent) preventive examination.  Review of Systems     Cardiac Risk Factors include: obesity (BMI >30kg/m2);dyslipidemia;diabetes mellitus     Objective:    There were no vitals filed for this visit. There is no height or weight on file to calculate BMI.     07/12/2022    4:06 PM 01/22/2022    2:48 PM 01/21/2022   11:00 AM 07/27/2021    2:47 PM 07/06/2021   11:37 AM 11/20/2019    9:35 PM 05/22/2019    6:27 AM  Advanced Directives  Does Patient Have a Medical Advance Directive? No No No Yes Yes No Yes  Type of Advance Directive    Healthcare Power of [place]  Does patient want to make changes to medical advance directive?       No - Patient declined  Copy of [place] in Chart?     No - copy requested  No - copy requested  Would patient like information on creating a medical advance directive? No - Patient declined  No - Patient declined   No - Patient declined     Current Medications (verified) Outpatient Encounter Medications as of 07/12/2022  Medication Sig   cyclobenzaprine (FLEXERIL) 10 MG tablet TAKE 1 TABLET BY MOUTH THREE TIMES DAILY AS NEEDED   diclofenac Sodium (VOLTAREN) 1 % GEL Apply 4 g topically 4 (four) times daily as needed.    fluticasone (FLONASE) 50 MCG/ACT nasal spray SHAKE LIQUID AND USE 2 SPRAYS IN EACH NOSTRIL DAILY   levocetirizine (XYZAL) 5 MG tablet Take 1 tablet (5 mg total) by mouth every evening.   mycophenolate (CELLCEPT) 500 MG tablet Take 500 mg by mouth 2 (two) times daily.   olopatadine (PATANOL) 0.1 % ophthalmic solution INSTILL 1 DROP IN BOTH EYES TWICE DAILY   Omega-3 Fatty Acids (FISH OIL) 1000 MG CAPS Take 1 capsule (1,000 mg total) by mouth daily.   predniSONE (DELTASONE) 1 MG tablet Take 5 mg by mouth daily.   tacrolimus (PROGRAF) 1 MG capsule Take 2 capsules (2 mg total) by mouth 2 (two) times daily.   [DISCONTINUED] oxyCODONE-acetaminophen (PERCOCET/ROXICET) 5-325 MG tablet Take 1 tablet by mouth every 8 (eight) hours as needed for severe pain. (Patient not taking: Reported on 07/11/2022)   No facility-administered encounter medications on file as of 07/12/2022.    Allergies (verified) Erythromycin, Ezetimibe, Other, Rofecoxib, Statins, Zonisamide, Carvacrol, Elavil [amitriptyline], Metoprolol, and Nsaids   History: Past Medical History:  Diagnosis Date   Anxiety    AS (sickle cell trait) ([place]) 06/29/2019   Autoimmune hepatitis ([place])    Chronic renal insufficiency    Cirrhosis ([place])    COLONIC POLYPS, ADENOMATOUS, HX OF 05/05/2008   Qualifier: Diagnosis of  By: [name], [name]     Depression  External hemorrhoids    Fibromyalgia    GERD (gastroesophageal reflux disease)    Hyperlipidemia    Internal hemorrhoids    Iron deficiency anemia    Liver replaced by transplant ([place]) 02/22/2009   Qualifier: Diagnosis of  By: Chester Holstein NP, Nevin Bloodgood     Migraine    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    Ruptured disc, cervical    multiple levels   Statin intolerance 05/13/2018   Past Surgical History:  Procedure Laterality Date   ABDOMINAL HYSTERECTOMY  2010   KNEE ARTHROSCOPY Right 2010   LIVER TRANSPLANT  1992   ROTATOR CUFF REPAIR Left 2010   x2   Family History   Problem Relation Age of Onset   Diabetes Brother    Heart disease Sister    Hypertension Sister    Diabetes Mother    Dementia Mother    Alzheimer's disease Mother    Gout Mother    Hypertension Mother    Stroke Mother    Cancer Sister    Colon cancer Neg Hx    Social History   Socioeconomic History   Marital status: Single    Spouse name: Not on file   Number of children: 1   Years of education: Not on file   Highest education level: Associate degree: academic program  Occupational History   Not on file  Tobacco Use   Smoking status: Never   Smokeless tobacco: Never  Vaping Use   Vaping Use: Never used  Substance and Sexual Activity   Alcohol use: Never    Alcohol/week: 0.0 standard drinks of alcohol   Drug use: Never   Sexual activity: Not on file  Other Topics Concern   Not on file  Social History Narrative   Lives at home alone    Right handed   Caffeine: none   Social Determinants of Health   Financial Resource Strain: Low Risk  (07/08/2022)   Overall Financial Resource Strain (CARDIA)    Difficulty of Paying Living Expenses: Not hard at all  Food Insecurity: No Food Insecurity (07/08/2022)   Hunger Vital Sign    Worried About Running Out of Food in the Last Year: Never true    [place] in the Last Year: Never true  Transportation Needs: No Transportation Needs (07/08/2022)   PRAPARE - Hydrologist (Medical): No    Lack of Transportation (Non-Medical): No  Physical Activity: Insufficiently Active (07/08/2022)   Exercise Vital Sign    Days of Exercise per Week: 3 days    Minutes of Exercise per Session: 30 min  Stress: No Stress Concern Present (07/08/2022)   [place]    Feeling of Stress : Not at all  Social Connections: Unknown (07/08/2022)   Social Connection and Isolation Panel [NHANES]    Frequency of Communication with Friends and Family: Patient refused     Frequency of Social Gatherings with Friends and Family: Patient refused    Attends Religious Services: Not on Diplomatic Services operational officer of Clubs or Organizations: Yes    Attends Archivist Meetings: More than 4 times per year    Marital Status: Widowed    Tobacco Counseling Counseling given: Not Answered   Clinical Intake:  Pre-visit preparation completed: Yes  Pain : No/denies pain     BMI - recorded: 32.65 Nutritional Status: BMI > 30  Obese Nutritional Risks: None Diabetes: Yes CBG done?:  No CBG resulted in Enter/ Edit results?: No Did pt. bring in CBG monitor from home?: No  How often do you need to have someone help you when you read instructions, pamphlets, or other written materials from your doctor or pharmacy?: 1 - Never  Diabetic?Nutrition Risk Assessment:  Has the patient had any N/V/D within the last 2 months?  No  Does the patient have any non-healing wounds?  No  Has the patient had any unintentional weight loss or weight gain?  No   Diabetes:  Is the patient diabetic?  Yes  If diabetic, was a CBG obtained today?  No  Did the patient bring in their glucometer from home?  No  How often do you monitor your CBG's? As needed .   Financial Strains and Diabetes Management:  Are you having any financial strains with the device, your supplies or your medication? No .  Does the patient want to be seen by Chronic Care Management for management of their diabetes?  No  Would the patient like to be referred to a Nutritionist or for Diabetic Management?  No   Diabetic Exams:  Diabetic Eye Exam: Completed 07/19/21 Diabetic Foot Exam: Completed 11/28/21   Interpreter Needed?: No  Information entered by :: Charlott Rakes, LPN   Activities of Daily Living    07/08/2022    9:02 AM  In your present state of health, do you have any difficulty performing the following activities:  Hearing? 0  Vision? 0  Difficulty concentrating or making decisions? 0   Walking or climbing stairs? 1  Comment related to knees  Dressing or bathing? 0  Doing errands, shopping? 0  Preparing Food and eating ? N  Using the Toilet? N  In the past six months, have you accidently leaked urine? N  Do you have problems with loss of bowel control? N  Managing your Medications? N  Managing your Finances? N  Housekeeping or managing your Housekeeping? N    Patient Care Team: Leamon Arnt, MD as PCP - General (Family Medicine) Mauri Pole, MD as Consulting Physician (Gastroenterology) Lavonia Drafts (Pain Medicine) Melvenia Beam, MD as Consulting Physician (Neurology) Teena Irani, MD as Consulting Physician (Internal Medicine) Marko Stai (Physician Assistant)  Indicate any recent Medical Services you may have received from other than [facility] providers in the past year (date may be approximate).     Assessment:   This is a routine wellness examination for [place].  Hearing/Vision screen Hearing Screening - Comments:: Pt denies any hearing issues  Vision Screening - Comments:: Pt follows up with [facility] for annual eye exams   Dietary issues and exercise activities discussed: Current Exercise Habits: Home exercise routine, Type of exercise: Other - see comments, Time (Minutes): 30, Frequency (Times/Week): 3, Weekly Exercise (Minutes/Week): 90   Goals Addressed             This Visit's Progress    Patient Stated       Get healthier         Depression Screen    07/12/2022    4:05 PM 07/11/2022    9:09 AM 05/10/2022    1:04 PM 04/12/2022   11:00 AM 02/14/2022    8:26 AM 01/25/2022    8:36 AM 11/28/2021    9:22 AM  PHQ 2/9 Scores  PHQ - 2 Score 0 0 0 0 0 0 0    Fall Risk    07/11/2022    9:08 AM 07/08/2022  9:02 AM 05/10/2022    1:04 PM 04/12/2022   11:00 AM 01/25/2022    8:36 AM  Fall Risk   Falls in the past year? 0 0 0 0 0  Number falls in past yr: 0 0 0 0 0  Injury with Fall? 0 0 0 0 0  Risk for fall due to : No  Fall Risks Impaired vision No Fall Risks No Fall Risks No Fall Risks  Follow up Falls evaluation completed Falls prevention discussed Falls evaluation completed Falls evaluation completed Falls evaluation completed    [place]:  Any stairs in or around the home? No  If so, are there any without handrails? No  Home free of loose throw rugs in walkways, pet beds, electrical cords, etc? Yes  Adequate lighting in your home to reduce risk of falls? Yes   ASSISTIVE DEVICES UTILIZED TO PREVENT FALLS:  Life alert? Yes  Use of a cane, walker or w/c? No  Grab bars in the bathroom? No  Shower chair or bench in shower? No  Elevated toilet seat or a handicapped toilet? No   TIMED UP AND GO:  Was the test performed? No .  Cognitive Function:        07/12/2022    4:09 PM 07/06/2021   11:40 AM  6CIT Screen  What Year? 0 points 0 points  What month? 0 points 0 points  What time? 0 points 0 points  Count back from 20 0 points 0 points  Months in reverse 0 points 0 points  Repeat phrase 0 points 0 points  Total Score 0 points 0 points    Immunizations Immunization History  Administered Date(s) Administered   Hepatitis A, Adult 09/27/2016, 09/26/2017   Influenza Split 03/05/2012   Influenza, Quadrivalent, Recombinant, Inj, Pf 04/29/2013, 03/28/2016   Influenza, Seasonal, Injecte, Preservative Fre 03/02/2014, 02/28/2015   Influenza,inj,Quad PF,6+ Mos 03/28/2016   Influenza-Unspecified 05/26/2013, 03/02/2014, 02/28/2015, 03/28/2016   PFIZER Comirnaty(Gray Top)Covid-19 Tri-Sucrose Vaccine 08/27/2019, 09/17/2019, 07/19/2020   PFIZER(Purple Top)SARS-COV-2 Vaccination 08/27/2019, 09/17/2019   PNEUMOCOCCAL CONJUGATE-20 09/07/2021   Pneumococcal Conjugate-13 09/22/2015   Pneumococcal Polysaccharide-23 09/27/2016   Tdap 05/17/2008, 12/03/2010   Tetanus 05/04/2020    TDAP status: Up to date  Flu Vaccine status: Declined, Education has been provided  regarding the importance of this vaccine but patient still declined. Advised may receive this vaccine at local pharmacy or Health Dept. Aware to provide a copy of the vaccination record if obtained from local pharmacy or Health Dept. Verbalized acceptance and understanding.    Covid-19 vaccine status: Declined, Education has been provided regarding the importance of this vaccine but patient still declined. Advised may receive this vaccine at local pharmacy or Health Dept.or vaccine clinic. Aware to provide a copy of the vaccination record if obtained from local pharmacy or Health Dept. Verbalized acceptance and understanding.  Qualifies for Shingles Vaccine? Yes   Zostavax completed No   Shingrix Completed?: No.    Education has been provided regarding the importance of this vaccine. Patient has been advised to call insurance company to determine out of pocket expense if they have not yet received this vaccine. Advised may also receive vaccine at local pharmacy or Health Dept. Verbalized acceptance and understanding.  Screening Tests Health Maintenance  Topic Date Due   OPHTHALMOLOGY EXAM  07/19/2022   Diabetic kidney evaluation - Urine ACR  08/29/2022   HEMOGLOBIN A1C  11/09/2022   FOOT EXAM  11/29/2022   Diabetic kidney evaluation -  eGFR measurement  05/11/2023   MAMMOGRAM  06/17/2023   Medicare Annual Wellness (AWV)  07/13/2023   DTaP/Tdap/Td (4 - Td or Tdap) 05/04/2030   COLONOSCOPY (Pts 45-6yr Insurance coverage will need to be confirmed)  01/25/2031   Hepatitis C Screening  Completed   HIV Screening  Completed   HPV VACCINES  Aged Out   INFLUENZA VACCINE  Discontinued   COVID-19 Vaccine  Discontinued   Zoster Vaccines- Shingrix  Discontinued    Health Maintenance  There are no preventive care reminders to display for this patient.   Colorectal cancer screening: Type of screening: Colonoscopy. Completed 01/24/21. Repeat every 10 years  Mammogram status: Completed 06/16/22.  Repeat every year     Additional Screening:  Hepatitis C Screening:  Completed 02/24/01  Vision Screening: Recommended annual ophthalmology exams for early detection of glaucoma and other disorders of the eye. Is the patient up to date with their annual eye exam?  Yes  Who is the provider or what is the name of the office in which the patient attends annual eye exams? [facility]  If pt is not established with a provider, would they like to be referred to a provider to establish care? No .   Dental Screening: Recommended annual dental exams for proper oral hygiene  Community Resource Referral / Chronic Care Management: CRR required this visit?  No   CCM required this visit?  No      Plan:     I have personally reviewed and noted the following in the patient's chart:   Medical and social history Use of alcohol, tobacco or illicit drugs  Current medications and supplements including opioid prescriptions. Patient is not currently taking opioid prescriptions. Functional ability and status Nutritional status Physical activity Advanced directives List of other physicians Hospitalizations, surgeries, and ER visits in previous 12 months Vitals Screenings to include cognitive, depression, and falls Referrals and appointments  In addition, I have reviewed and discussed with patient certain preventive protocols, quality metrics, and best practice recommendations. A written personalized care plan for preventive services as well as general preventive health recommendations were provided to patient.     TWillette Brace LPN   23/0/0762  Nurse Notes: none

## 2022-07-23 DIAGNOSIS — E559 Vitamin D deficiency, unspecified: Secondary | ICD-10-CM | POA: Diagnosis not present

## 2022-07-23 DIAGNOSIS — Z944 Liver transplant status: Secondary | ICD-10-CM | POA: Diagnosis not present

## 2022-07-23 DIAGNOSIS — R5383 Other fatigue: Secondary | ICD-10-CM | POA: Diagnosis not present

## 2022-07-23 DIAGNOSIS — D849 Immunodeficiency, unspecified: Secondary | ICD-10-CM | POA: Diagnosis not present

## 2022-08-16 DIAGNOSIS — D849 Immunodeficiency, unspecified: Secondary | ICD-10-CM | POA: Diagnosis not present

## 2022-08-16 DIAGNOSIS — Z944 Liver transplant status: Secondary | ICD-10-CM | POA: Diagnosis not present

## 2022-09-10 DIAGNOSIS — Z5181 Encounter for therapeutic drug level monitoring: Secondary | ICD-10-CM | POA: Diagnosis not present

## 2022-09-10 DIAGNOSIS — Z944 Liver transplant status: Secondary | ICD-10-CM | POA: Diagnosis not present

## 2022-09-10 DIAGNOSIS — M5136 Other intervertebral disc degeneration, lumbar region: Secondary | ICD-10-CM | POA: Diagnosis not present

## 2022-09-10 DIAGNOSIS — D849 Immunodeficiency, unspecified: Secondary | ICD-10-CM | POA: Diagnosis not present

## 2022-09-10 DIAGNOSIS — E785 Hyperlipidemia, unspecified: Secondary | ICD-10-CM | POA: Diagnosis not present

## 2022-09-17 ENCOUNTER — Ambulatory Visit: Payer: Medicare Other | Admitting: Family Medicine

## 2022-09-17 ENCOUNTER — Ambulatory Visit (INDEPENDENT_AMBULATORY_CARE_PROVIDER_SITE_OTHER): Payer: Medicare Other

## 2022-09-17 VITALS — BP 126/88 | HR 109 | Ht 64.0 in | Wt 191.0 lb

## 2022-09-17 DIAGNOSIS — M25551 Pain in right hip: Secondary | ICD-10-CM

## 2022-09-17 DIAGNOSIS — M4316 Spondylolisthesis, lumbar region: Secondary | ICD-10-CM | POA: Diagnosis not present

## 2022-09-17 DIAGNOSIS — G8929 Other chronic pain: Secondary | ICD-10-CM

## 2022-09-17 DIAGNOSIS — M545 Low back pain, unspecified: Secondary | ICD-10-CM

## 2022-09-17 MED ORDER — OXYCODONE-ACETAMINOPHEN 5-325 MG PO TABS: 1.0000 | ORAL_TABLET | Freq: Three times a day (TID) | ORAL | 0 refills | Status: DC | PRN

## 2022-09-17 NOTE — Progress Notes (Signed)
Rubin Payor, PhD, LAT, ATC acting as a scribe for [name], MD.  Cassandra [name] is a 62 y.o. female who presents to [facility] today for worsening LBP. She has a hx of a liver transplant, cared for by [facility]. She had a left L5 nerve root block and transforaminal epidural steroid injection on December 15th. Pt was last seen by Dr. Denyse Amass on 05/31/22 and a repeat ESI was ordered, asking the radiologist to consider an interlaminar injection and her oxycodone was refilled.   Today, pt reports pain is now on the R-side, starting on Monday. Pt locates pain to R-side of the low back and into R thigh. She also c/o pain in her R-trapz w/ radiating pain and numbness into her R arm. No new falls/injury.   Dx imaging: 05/06/22 L-spine MRI 04/24/22 L-spine XR  Pertinent review of systems: No fevers or chills  Relevant historical information: Diabetes.  Kidney disease.  History of liver transplant   Exam:  BP 126/88   Pulse (!) 109   Ht  (1.626 m)   Wt 191 lb (86.6 kg)   SpO2 95%   BMI 32.79 kg/m  General: Well Developed, well nourished, and in no acute distress.   MSK: Right hip: Normal-appearing Tender palpation greater trochanter.  Hip abduction and external rotation strength is diminished with pain rated 4/5. Tender palpation Right SI joint also.  L-spine: Decreased lumbar motion.  Lower extremity strength is decreased as above reduced hip abduction.  Otherwise intact.    Lab and Radiology Results  Hip greater trochanteric injection: Right Consent obtained and timeout performed. Area of maximum tenderness palpated and identified. Skin cleaned with alcohol, cold spray applied. A 22g needle was used to access the greater trochanteric bursa.  of Kenalog and 2 mL of Marcaine were used to inject the trochanteric bursa. Patient tolerated the procedure well.  X-ray images lumbar spine and right hip obtained today personally  and independently interpreted  L-spine: DDD L5-S1 and L4-L5 facet DJD at these levels.  No acute fractures.  Right hip: DJD right hip mild. No fracture.   Await formal radiology review    Assessment and Plan: 62 y.o. female with right lateral hip pain.  Pain primarily due to greater trochanteric bursitis.  There may be a component of lumbar radiculopathy as well at or around L3 right side. Plan for a trial of greater trochanter injection today.  Oxycodone prescribed as well for pain control. Could potentially try an SI injection in a week if needed.  Recheck in 1 month.  Consider epidural steroid injection.   PDMP reviewed during this encounter. Orders Placed This Encounter  Procedures   DG HIP UNILAT W OR W/O PELVIS 2-3 VIEWS RIGHT    Standing Status:   Future    Number of Occurrences:   1    Standing Expiration Date:   10/17/2022    Order Specific Question:   Reason for Exam (SYMPTOM  OR DIAGNOSIS REQUIRED)    Answer:   Right hip pain    Order Specific Question:   Preferred imaging location?    Answer:   [facility]   DG Lumbar Spine 2-3 Views    Standing Status:   Future    Number of Occurrences:   1    Standing Expiration Date:   10/17/2022    Order Specific Question:   Reason for Exam (SYMPTOM  OR DIAGNOSIS REQUIRED)    Answer:  low back pain    Order Specific Question:   Preferred imaging location?    Answer:   [facility]   Meds ordered this encounter  Medications   oxyCODONE-acetaminophen (PERCOCET/ROXICET) 5-325 MG tablet    Sig: Take 1 tablet by mouth every 8 (eight) hours as needed for severe pain.    Dispense:  15 tablet    Refill:  0     Discussed warning signs or symptoms. Please see discharge instructions. Patient expresses understanding.   The above documentation has been reviewed and is accurate and complete [name], M.D.

## 2022-09-17 NOTE — Patient Instructions (Addendum)
Thank you for coming in today.   Please get an Xray today before you leave   You received an injection today. Seek immediate medical attention if the joint becomes red, extremely painful, or is oozing fluid.   If not improving next week, we could inject a different area  Recheck in 1 month

## 2022-09-19 NOTE — Progress Notes (Signed)
Lumbar spine x-ray shows arthritis changes in the low back.

## 2022-09-19 NOTE — Progress Notes (Signed)
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

## 2022-09-20 ENCOUNTER — Ambulatory Visit: Payer: Medicare Other | Admitting: Family Medicine

## 2022-09-20 ENCOUNTER — Encounter: Payer: Self-pay | Admitting: Family Medicine

## 2022-09-20 ENCOUNTER — Ambulatory Visit (INDEPENDENT_AMBULATORY_CARE_PROVIDER_SITE_OTHER): Payer: Medicare Other | Admitting: Family Medicine

## 2022-09-20 VITALS — BP 150/96 | HR 82 | Temp 98.1°F | Ht 64.0 in | Wt 191.4 lb

## 2022-09-20 DIAGNOSIS — G8929 Other chronic pain: Secondary | ICD-10-CM

## 2022-09-20 DIAGNOSIS — R030 Elevated blood-pressure reading, without diagnosis of hypertension: Secondary | ICD-10-CM

## 2022-09-20 DIAGNOSIS — Z7952 Long term (current) use of systemic steroids: Secondary | ICD-10-CM

## 2022-09-20 DIAGNOSIS — E119 Type 2 diabetes mellitus without complications: Secondary | ICD-10-CM | POA: Diagnosis not present

## 2022-09-20 DIAGNOSIS — M545 Low back pain, unspecified: Secondary | ICD-10-CM | POA: Diagnosis not present

## 2022-09-20 DIAGNOSIS — Z9189 Other specified personal risk factors, not elsewhere classified: Secondary | ICD-10-CM

## 2022-09-20 LAB — POCT GLYCOSYLATED HEMOGLOBIN (HGB A1C): Hemoglobin A1C: 6.4 % — AB (ref 4.0–5.6)

## 2022-09-20 LAB — MICROALBUMIN / CREATININE URINE RATIO
Creatinine,U: 93.3 mg/dL
Microalb Creat Ratio: 1.1 mg/g (ref 0.0–30.0)
Microalb, Ur: 1.1 mg/dL (ref 0.0–1.9)

## 2022-09-20 NOTE — Progress Notes (Signed)
Subjective  CC:  Chief Complaint  Patient presents with   Referral    Pt stated that she needs a referral for DEXA   Diabetes    HPI: Cassandra [name] is a 62 y.o. female who presents to the office today for follow up of diabetes and problems listed above in the chief complaint.  Patient reports she has been seen by her liver transplant doctor.  They are requesting a bone density test to screen for osteoporosis given her high risk factors of the chronic prednisone use.  She has a chronic back pain.  At risk for compression fractures. Diabetes follow up: Her diabetic control is reported as Unchanged.  Diet controlled.  No symptoms of hypoglycemia She denies exertional CP or SOB or symptomatic hypoglycemia. She denies foot sores or paresthesias.  She is due an eye exam Elevated blood pressure: She reports her blood pressure has been elevated over the last several weeks due to her flare of back and hip pain.  Also, she had a very high sodium meal last night.  She does check her blood pressures intermittently and typically they are normal.  No current history of hypertension. Chronic back pain: Was evaluated at [facility] and treated with an injection and pain medicines.  She is doing better.  She does have scheduled follow-up  Wt Readings from Last 3 Encounters:  09/20/22 191 lb 6.4 oz (86.8 kg)  09/17/22 191 lb (86.6 kg)  07/11/22 190 lb 3.2 oz (86.3 kg)    BP Readings from Last 3 Encounters:  09/20/22 (!) 150/96  09/17/22 126/88  07/11/22 122/74    Assessment  1. Diet-controlled type 2 diabetes mellitus   2. Current chronic use of systemic steroids   3. At high risk for osteoporosis   4. Elevated blood pressure reading without diagnosis of hypertension   5. Chronic midline low back pain without sciatica      Plan  Diabetes is currently very well controlled.  Remains diet controlled.  Recommend eye exam.  Check urine nephropathy screen today. High risk for osteoporosis due to  chronic steroid use: Bone density ordered.  Patient to schedule at her convenience Elevated blood pressure: Patient will start monitoring at home and return if blood pressures remain elevated.  She is typically normotensive. Chronic back pain: Continue with [facility] specialist.  Follow up: 6 months to recheck blood pressure and diabetes. Orders Placed This Encounter  Procedures   DG Bone Density   Microalbumin / creatinine urine ratio   POCT HgB A1C   No orders of the defined types were placed in this encounter.     Immunization History  Administered Date(s) Administered   Hepatitis A, Adult 09/27/2016, 09/26/2017   Influenza Split 03/05/2012   Influenza, Quadrivalent, Recombinant, Inj, Pf 04/29/2013, 03/28/2016   Influenza, Seasonal, Injecte, Preservative Fre 03/02/2014, 02/28/2015   Influenza,inj,Quad PF,6+ Mos 03/28/2016   Influenza-Unspecified 05/26/2013, 03/02/2014, 02/28/2015, 03/28/2016   PFIZER Comirnaty(Gray Top)Covid-19 Tri-Sucrose Vaccine 08/27/2019, 09/17/2019, 07/19/2020   PFIZER(Purple Top)SARS-COV-2 Vaccination 08/27/2019, 09/17/2019   PNEUMOCOCCAL CONJUGATE-20 09/07/2021   Pneumococcal Conjugate-13 09/22/2015   Pneumococcal Polysaccharide-23 09/27/2016   Tdap 05/17/2008, 12/03/2010   Tetanus 05/04/2020    Diabetes Related Lab Review: Lab Results  Component Value Date   HGBA1C 6.4 (A) 09/20/2022   HGBA1C 6.4 05/10/2022   HGBA1C 5.9 (A) 02/14/2022    Lab Results  Component Value Date   MICROALBUR 0.9 08/28/2021   Lab Results  Component Value Date   CREATININE 1.16 05/10/2022  BUN 17 05/10/2022   NA 136 05/10/2022   K 4.0 05/10/2022   CL 103 05/10/2022   CO2 25 05/10/2022   Lab Results  Component Value Date   CHOL 247 (H) 05/10/2022   CHOL 275 (H) 02/28/2021   CHOL 273 (H) 02/22/2020   Lab Results  Component Value Date   HDL 54.10 05/10/2022   HDL 58.40 02/28/2021   HDL 46 (L) 02/22/2020   Lab Results  Component Value Date   LDLCALC 187 (H)  02/28/2021   LDLCALC 189 (H) 02/22/2020   LDLCALC 78 12/13/2011   Lab Results  Component Value Date   TRIG 220.0 (H) 05/10/2022   TRIG 148.0 02/28/2021   TRIG 203 (H) 02/22/2020   Lab Results  Component Value Date   CHOLHDL 5 05/10/2022   CHOLHDL 5 02/28/2021   CHOLHDL 5.9 (H) 02/22/2020   Lab Results  Component Value Date   LDLDIRECT 152.0 05/10/2022   LDLDIRECT 212.0 01/21/2019   LDLDIRECT 114.1 10/28/2009   The ASCVD Risk score (Arnett DK, et al., 2019) failed to calculate for the following reasons:   Unable to determine if patient is Non-Hispanic African American I have reviewed the PMH, Fam and Soc history. Patient Active Problem List   Diagnosis Date Noted   Lumbar spinal stenosis 05/10/2022    Priority: High   Diet-controlled type 2 diabetes mellitus 08/28/2021    Priority: High    Neg urine a/c ratio 08/2021, not on ACE.  Lab Results  Component Value Date   HGBA1C 6.4 05/10/2022   HGBA1C 5.9 (A) 02/14/2022   HGBA1C 6.1 (A) 08/28/2021    A1c 6.5 02/2021    Chronic kidney disease (CKD) stage G3a/A1, moderately decreased glomerular filtration rate (GFR) between 45-59 mL/min/1.73 square meter and albuminuria creatinine ratio less than 30 mg/g 03/01/2021    Priority: High   Statin myopathy 05/13/2018    Priority: High   Primary insomnia 11/04/2017    Priority: High   Immunosuppressed status 10/07/2017    Priority: High   Adenomatous polyp of colon 09/28/2017    Priority: High    Per [facility] liver clinic found in 2009; followed up in 2011.    Mixed hyperlipidemia 03/10/2013    Priority: High   Primary osteoarthritis of knees, bilateral 03/01/2013    Priority: High    Dr. Earma Reading saw in 08/2016 for pain and was considering Synvisc.  High risk for total knee due to immune suppressant post liver transplant. [facility] 2019: severe OA right knee with recurrent effusions. S/p synvisc w/o relief. Holding off on replacement.     Chronic back pain 10/13/2012     Priority: High   History of liver transplant (HCC) 02/22/2009    Priority: High    [facility]:due to AIH with cirrhosis.  1992.     Migraine with aura and without status migrainosus, not intractable 07/03/2018    Priority: Medium    Cervical facet joint syndrome 03/28/2018    Priority: Medium    Spondylosis of cervical region without myelopathy or radiculopathy 03/28/2018    Priority: Medium    Lumbar facet arthropathy 12/27/2017    Priority: Medium    Cervical stenosis of spine 01/25/2017    Priority: Medium    Fibromyalgia 08/24/2013    Priority: Medium    Hx of tuberculosis 10/13/2012    Priority: Medium    Anxiety disorder 09/16/2007    Priority: Medium     Has failed prozac, elavil, zoloft in past.  Used nightly  xanax since 2000; weaned 2019.     AS (sickle cell trait) 06/29/2019    Priority: Low    Social History: Patient  reports that she has never smoked. She has never used smokeless tobacco. She reports that she does not drink alcohol and does not use drugs.  Review of Systems: Ophthalmic: negative for eye pain, loss of vision or double vision Cardiovascular: negative for chest pain Respiratory: negative for SOB or persistent cough Gastrointestinal: negative for abdominal pain Genitourinary: negative for dysuria or gross hematuria MSK: negative for foot lesions Neurologic: negative for weakness or gait disturbance  Objective  Vitals: BP (!) 150/96   Pulse 82   Temp 98.1 F (36.7 C)   Ht  (1.626 m)   Wt 191 lb 6.4 oz (86.8 kg)   SpO2 96%   BMI 32.85 kg/m  General: well appearing, no acute distress  Psych:  Alert and oriented, normal mood and affect HEENT:  Normocephalic, atraumatic,   Diabetic education: ongoing education regarding chronic disease management for diabetes was given today. We continue to reinforce the ABC's of diabetic management: A1c (<7 or 8 dependent upon patient), tight blood pressure control, and cholesterol management with goal LDL <  100 minimally. We discuss diet strategies, exercise recommendations, medication options and possible side effects. At each visit, we review recommended immunizations and preventive care recommendations for diabetics and stress that good diabetic control can prevent other problems. See below for this patient's data.   Commons side effects, risks, benefits, and alternatives for medications and treatment plan prescribed today were discussed, and the patient expressed understanding of the given instructions. Patient is instructed to call or message via MyChart if he/she has any questions or concerns regarding our treatment plan. No barriers to understanding were identified. We discussed Red Flag symptoms and signs in detail. Patient expressed understanding regarding what to do in case of urgent or emergency type symptoms.  Medication list was reconciled, printed and provided to the patient in AVS. Patient instructions and summary information was reviewed with the patient as documented in the AVS. This note was prepared with assistance of Dragon voice recognition software. Occasional wrong-word or sound-a-like substitutions may have occurred due to the inherent limitations of voice recognition software

## 2022-09-20 NOTE — Patient Instructions (Addendum)
Please return in 6 months to recheck diabetes and blood pressure  Please monitor your blood pressure at home and return if readings remain elevated. Your diabetes remains well-controlled  If you have any questions or concerns, please don't hesitate to send me a message via MyChart or call the office at [phone]. Thank you for visiting with [place] today! It's our pleasure caring for you.   Please call the office checked below to schedule your appointment for your mammogram and/or bone density screen:   Mammogram    Bone Density    [facility]      [address] [place], [place]        [phone]           [facility]  [address] [place], [place]  [phone]

## 2022-09-24 ENCOUNTER — Ambulatory Visit: Payer: Medicare Other | Admitting: Family Medicine

## 2022-09-25 ENCOUNTER — Other Ambulatory Visit: Payer: Self-pay | Admitting: Family Medicine

## 2022-09-25 DIAGNOSIS — Z7952 Long term (current) use of systemic steroids: Secondary | ICD-10-CM

## 2022-09-25 DIAGNOSIS — Z9189 Other specified personal risk factors, not elsewhere classified: Secondary | ICD-10-CM

## 2022-09-26 DIAGNOSIS — Z944 Liver transplant status: Secondary | ICD-10-CM | POA: Diagnosis not present

## 2022-09-26 DIAGNOSIS — D849 Immunodeficiency, unspecified: Secondary | ICD-10-CM | POA: Diagnosis not present

## 2022-09-28 ENCOUNTER — Ambulatory Visit: Payer: Medicare Other | Admitting: Family Medicine

## 2022-09-28 ENCOUNTER — Ambulatory Visit: Payer: Self-pay

## 2022-09-28 VITALS — BP 168/88 | HR 64 | Ht 64.0 in | Wt 193.0 lb

## 2022-09-28 DIAGNOSIS — M5441 Lumbago with sciatica, right side: Secondary | ICD-10-CM | POA: Diagnosis not present

## 2022-09-28 DIAGNOSIS — G8929 Other chronic pain: Secondary | ICD-10-CM | POA: Diagnosis not present

## 2022-09-28 MED ORDER — OXYCODONE-ACETAMINOPHEN 5-325 MG PO TABS: 1.0000 | ORAL_TABLET | Freq: Three times a day (TID) | ORAL | 0 refills | Status: DC | PRN

## 2022-09-28 NOTE — Progress Notes (Signed)
Rubin Payor, PhD, LAT, ATC acting as a scribe for Cassandra Graham, MD.  CHERICA [name] is a 62 y.o. female who presents to [facility] today for cont'd R hip and low back pain. She has a hx of a liver transplant, cared for by [facility]. She had a left L5 nerve root block and transforaminal epidural steroid injection on December 15th and an ESI on 06/07/22. Pt was last seen by Dr. Denyse Amass on 09/17/22 and was given a R GT steroid injection and prescribed oxycodone. Today, pt reports prior GT injection didn't really last. Pt locates pain to her R-buttock, radiating down her right leg along the posterior-lateral aspect. + numbness/tingling. She is also having pain across her low back right worse than left.  Dx imaging: 09/17/22 L-spine & R hip XR 05/06/22 L-spine MRI 04/24/22 L-spine XR  Pertinent review of systems: No fevers or chills  Relevant historical information: Diabetes and migraine headaches.   Exam:  BP (!) 168/88   Pulse 64   Ht 5\' 4"  (1.626 m)   Wt 193 lb (87.5 kg)   SpO2 99%   BMI 33.13 kg/m  General: Well Developed, well nourished, and in no acute distress.   MSK: L-spine: Tender palpation right SI joint. Normal lumbar motion and strength.    Lab and Radiology Results  Procedure: Real-time Ultrasound Guided Injection of right SI joint Device: Philips Affiniti 50G Images permanently stored and available for review in PACS Verbal informed consent obtained.  Discussed risks and benefits of procedure. Warned about infection, bleeding, hyperglycemia damage to structures among others. Patient expresses understanding and agreement Time-out conducted.   Noted no overlying erythema, induration, or other signs of local infection.   Skin prepped in a sterile fashion.   Local anesthesia: Topical Ethyl chloride.   With sterile technique and under real time ultrasound guidance: 40 mg of Kenalog and 2 mL of Marcaine injected into right SI  joint. Fluid seen entering the joint cleft.   Completed without difficulty   Pain immediately resolved suggesting accurate placement of the medication.   Advised to call if fevers/chills, erythema, induration, drainage, or persistent bleeding.   Images permanently stored and available for review in the ultrasound unit.  Impression: Technically successful ultrasound guided injection.         Assessment and Plan: 62 y.o. female with right low back pain.  Etiology is not superclear at this time.  Pain could be due to multiple different causes including SI joint dysfunction as above and potentially facet arthritis or lumbar radiculopathy are all 3 at the same time.  She had good pain response to SI joint injection which indicates this is probably one of her pain generators.  If her pain is not resolved over the weekend would consider epidural steroid injection for lumbar radiculopathy.  She has an MRI from December 2023 that does indicate the potential for right L5 radiculopathy.  She will notify me early next week how she is feeling. Oxycodone refilled.  PDMP not reviewed this encounter. Orders Placed This Encounter  Procedures   [place] LIMITED JOINT SPACE STRUCTURES LOW RIGHT(NO LINKED CHARGES)    Order Specific Question:   Reason for Exam (SYMPTOM  OR DIAGNOSIS REQUIRED)    Answer:   low back pain    Order Specific Question:   Preferred imaging location?    Answer:   [facility]   No orders of the defined types were placed in this  encounter.    Discussed warning signs or symptoms. Please see discharge instructions. Patient expresses understanding.   The above documentation has been reviewed and is accurate and complete Cassandra [name], M.D.

## 2022-09-28 NOTE — Patient Instructions (Addendum)
Thank you for coming in today.   You received an injection today. Seek immediate medical attention if the joint becomes red, extremely painful, or is oozing fluid.   Let me know if not better, and I can order an epidural steroid injection . Oxycodone refilled

## 2022-10-12 ENCOUNTER — Encounter: Payer: Self-pay | Admitting: Physician Assistant

## 2022-10-12 ENCOUNTER — Ambulatory Visit (INDEPENDENT_AMBULATORY_CARE_PROVIDER_SITE_OTHER): Payer: Medicare Other | Admitting: Physician Assistant

## 2022-10-12 VITALS — BP 144/98 | HR 103 | Temp 97.8°F | Resp 16 | Ht 65.0 in | Wt 187.6 lb

## 2022-10-12 DIAGNOSIS — J320 Chronic maxillary sinusitis: Secondary | ICD-10-CM

## 2022-10-12 DIAGNOSIS — Z9109 Other allergy status, other than to drugs and biological substances: Secondary | ICD-10-CM | POA: Diagnosis not present

## 2022-10-12 MED ORDER — OLOPATADINE HCL 0.1 % OP SOLN: 1.0000 [drp] | Freq: Two times a day (BID) | OPHTHALMIC | 3 refills | Status: AC

## 2022-10-12 MED ORDER — DOXYCYCLINE HYCLATE 100 MG PO TABS: 100.0000 mg | ORAL_TABLET | Freq: Two times a day (BID) | ORAL | 0 refills | Status: DC

## 2022-10-12 MED ORDER — LEVOCETIRIZINE DIHYDROCHLORIDE 5 MG PO TABS: 5.0000 mg | ORAL_TABLET | Freq: Every evening | ORAL | 2 refills | Status: DC

## 2022-10-12 NOTE — Progress Notes (Unsigned)
Subjective:    Patient ID: Cassandra [name], female    DOB: [birth year], 62 y.o.   MRN: [id number]  Chief Complaint  Patient presents with   Nasal Congestion    Transplant dr today her she had infection end of April    Fatigue    Eye redness Fatigue Wants thyroid levels checked    HPI Patient is in today for R sided nasal / sinus pain x 1-2 weeks. Some itchy / crusting in eyes. Using Flonase and Claritin-D without much relief. No fever or chills. No cough or SOB. No other symptoms.   Past Medical History:  Diagnosis Date   Anxiety    AS (sickle cell trait) (HCC) 06/29/2019   Autoimmune hepatitis (HCC)    Chronic renal insufficiency    Cirrhosis (HCC)    COLONIC POLYPS, ADENOMATOUS, HX OF 05/05/2008   Qualifier: Diagnosis of  By: [name], [name]     Depression    External hemorrhoids    Fibromyalgia    GERD (gastroesophageal reflux disease)    Hyperlipidemia    Internal hemorrhoids    Iron deficiency anemia    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   Qualifier: Diagnosis of  By: [name], [name]     Migraine    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    Ruptured disc, cervical    multiple levels   Statin intolerance 05/13/2018    Past Surgical History:  Procedure Laterality Date   ABDOMINAL HYSTERECTOMY  2010   KNEE ARTHROSCOPY Right 2010   LIVER TRANSPLANT  1992   ROTATOR CUFF REPAIR Left 2010   x2    Family History  Problem Relation Age of Onset   Diabetes Brother    Heart disease Sister    Hypertension Sister    Diabetes Mother    Dementia Mother    Alzheimer's disease Mother    Gout Mother    Hypertension Mother    Stroke Mother    Cancer Sister    Colon cancer Neg Hx     Social History   Tobacco Use   Smoking status: Never   Smokeless tobacco: Never  Vaping Use   Vaping Use: Never used  Substance Use Topics   Alcohol use: Never    Alcohol/week: 0.0 standard drinks of alcohol   Drug use: Never     Allergies   Allergen Reactions   Erythromycin Diarrhea    REACTION: Nausea/vomiting   Ezetimibe Other (See Comments)    Muscle Cramps Nausea Diarrhea   Other Nausea Only and Other (See Comments)    Knocks her out, or makes her sleepy and does not take them. Muscle Cramps Nausea Diarrhea Uncoded Allergy. Allergen: vioxx   Rofecoxib Other (See Comments)    Pt not sure but knows she can not take it. REACTION: Nausea/vomiting   Statins Other (See Comments)    myalgias   Zonisamide Other (See Comments)    Fogginess Fogginess    Carvacrol     Other reaction(s): Muscle Pain   Elavil [Amitriptyline] Other (See Comments)    groggy   Metoprolol     Other reaction(s): Muscle Pain   Nsaids     REACTION: GI Cramping/nausea    Review of Systems NEGATIVE UNLESS OTHERWISE INDICATED IN HPI      Objective:     BP (!) 144/98   Pulse (!) 103   Temp 97.8 F (36.6 C) (Temporal)   Resp 16   Ht 5\' 5"  (1.651 m)  Wt 187 lb 9.6 oz (85.1 kg)   SpO2 100%   BMI 31.22 kg/m   Wt Readings from Last 3 Encounters:  10/12/22 187 lb 9.6 oz (85.1 kg)  09/28/22 193 lb (87.5 kg)  09/20/22 191 lb 6.4 oz (86.8 kg)    BP Readings from Last 3 Encounters:  10/12/22 (!) 144/98  09/28/22 (!) 168/88  09/20/22 (!) 150/96     Physical Exam Vitals and nursing note reviewed.  Constitutional:      General: She is not in acute distress.    Appearance: Normal appearance. She is not ill-appearing.  HENT:     Head: Normocephalic.     Right Ear: Tympanic membrane, ear canal and external ear normal.     Left Ear: Tympanic membrane, ear canal and external ear normal.     Nose: Congestion present.     Right Sinus: Maxillary sinus tenderness present.     Mouth/Throat:     Mouth: Mucous membranes are moist.     Pharynx: No oropharyngeal exudate or posterior oropharyngeal erythema.  Eyes:     Extraocular Movements: Extraocular movements intact.     Conjunctiva/sclera: Conjunctivae normal.     Pupils: Pupils  are equal, round, and reactive to light.  Cardiovascular:     Rate and Rhythm: Normal rate and regular rhythm.     Pulses: Normal pulses.     Heart sounds: Normal heart sounds. No murmur heard. Pulmonary:     Effort: Pulmonary effort is normal. No respiratory distress.     Breath sounds: Normal breath sounds. No wheezing.  Musculoskeletal:     Cervical back: Normal range of motion.  Skin:    General: Skin is warm.  Neurological:     Mental Status: She is alert and oriented to person, place, and time.  Psychiatric:        Mood and Affect: Mood normal.        Behavior: Behavior normal.        Assessment & Plan:  Right maxillary sinusitis  Environmental allergies  Other orders -     Doxycycline Hyclate; Take 1 tablet (100 mg total) by mouth 2 (two) times daily for 7 days.  Dispense: 14 tablet; Refill: 0 -     Olopatadine HCl; Place 1 drop into both eyes 2 (two) times daily.  Dispense: 5 mL; Refill: 3 -     Levocetirizine Dihydrochloride; Take 1 tablet (5 mg total) by mouth every evening.  Dispense: 30 tablet; Refill: 2   Persistent symptoms >10 days despite conservative efforts at home. Will Rx doxycycline at this time, take with food. Cautioned on antibiotic use and possible side effects. Pataday drops and Xyzal to use as directed. Advised nasal saline, humidifier, and pushing fluids. Call if worse or no improvement.       Return if symptoms worsen or fail to improve.     Tiphany Fayson M Toyoko Silos, PA-C

## 2022-10-15 ENCOUNTER — Ambulatory Visit: Payer: Medicare Other | Admitting: Family Medicine

## 2022-10-16 ENCOUNTER — Ambulatory Visit: Payer: Medicare Other | Admitting: Family Medicine

## 2022-10-16 ENCOUNTER — Ambulatory Visit (INDEPENDENT_AMBULATORY_CARE_PROVIDER_SITE_OTHER): Payer: Medicare Other | Admitting: Family Medicine

## 2022-10-16 ENCOUNTER — Encounter: Payer: Self-pay | Admitting: Family Medicine

## 2022-10-16 VITALS — BP 138/88 | HR 104 | Ht 65.0 in | Wt 191.2 lb

## 2022-10-16 VITALS — BP 148/106 | HR 103 | Temp 98.3°F | Ht 65.0 in | Wt 187.8 lb

## 2022-10-16 DIAGNOSIS — M25551 Pain in right hip: Secondary | ICD-10-CM | POA: Diagnosis not present

## 2022-10-16 DIAGNOSIS — D849 Immunodeficiency, unspecified: Secondary | ICD-10-CM

## 2022-10-16 DIAGNOSIS — H0520 Unspecified exophthalmos: Secondary | ICD-10-CM | POA: Diagnosis not present

## 2022-10-16 DIAGNOSIS — M5441 Lumbago with sciatica, right side: Secondary | ICD-10-CM

## 2022-10-16 DIAGNOSIS — G8929 Other chronic pain: Secondary | ICD-10-CM | POA: Diagnosis not present

## 2022-10-16 DIAGNOSIS — Z944 Liver transplant status: Secondary | ICD-10-CM | POA: Diagnosis not present

## 2022-10-16 DIAGNOSIS — J320 Chronic maxillary sinusitis: Secondary | ICD-10-CM

## 2022-10-16 DIAGNOSIS — Z9109 Other allergy status, other than to drugs and biological substances: Secondary | ICD-10-CM | POA: Diagnosis not present

## 2022-10-16 DIAGNOSIS — N1831 Chronic kidney disease, stage 3a: Secondary | ICD-10-CM

## 2022-10-16 LAB — TSH: TSH: 1 u[IU]/mL (ref 0.35–5.50)

## 2022-10-16 MED ORDER — AMOXICILLIN 875 MG PO TABS: 875.0000 mg | ORAL_TABLET | Freq: Two times a day (BID) | ORAL | 0 refills | Status: AC

## 2022-10-16 MED ORDER — OXYCODONE-ACETAMINOPHEN 5-325 MG PO TABS: 1.0000 | ORAL_TABLET | Freq: Three times a day (TID) | ORAL | 0 refills | Status: DC | PRN

## 2022-10-16 NOTE — Progress Notes (Signed)
Subjective  CC:  Chief Complaint  Patient presents with   Thyroid concerns    Pt would like to have her Thyroids checked    HPI: Cassandra [name] is a 62 y.o. female who presents to the office today to address the problems listed above in the chief complaint. Patient was seen last week and treated for maxillary sinusitis due to pain in sinuses, mucopurulent drainage and drainage from the eyes.  She also had redness in the eyes.  She took doxycycline but feels that that caused leg pain and myalgias.  She has stopped it.  She does have chronic back pain and fibromyalgia.  She sees multiple specialist for this.  No fevers or chills.  Feels tired.  Puffy eyes.  No GI symptoms.  No neurologic deficits.  She is a liver transplant patient, transplant specialists are trying to adjust her medications because she had some mild elevations in her liver test.  However she feels that the medication changes are contributing to some of her symptomatology.  Review of systems is positive for back pain and leg pain with limiting ambulation.  Review of systems negative for palpitations, chest pain or lower extremity edema.  Assessment  1. Right maxillary sinusitis   2. Proptosis   3. Environmental allergies   4. History of liver transplant (HCC)   5. Immunosuppressed status (HCC) Chronic  6. Chronic kidney disease (CKD) stage G3a/A1, moderately decreased glomerular filtration rate (GFR) between 45-59 mL/min/1.73 square meter and albuminuria creatinine ratio less than 30 mg/g (HCC) Chronic     Plan  Maxillary sinusitis and not tolerating doxycycline: Nontoxic-appearing changed to amoxicillin 875 twice daily.  Has mild chronic kidney disease.  Avoid Augmentin due to history of diarrhea.  Continue Xyzal.  Check thyroid by her request although was normal in December.  Continue allergy treatments. She does have some mild lid lag on the right: Likely will need surgery on that in the future.  She has seen plastic  surgery in the past.  Not currently obstructing her vision. Chronic back pain, fibromyalgia and leg pain: She is seeing sports medicine and follow-up tomorrow.  She is able to ambulate clinic today.  Follow up: 5 months for follow-up diabetes and blood pressure Visit date not found  Orders Placed This Encounter  Procedures   TSH   Meds ordered this encounter  Medications   amoxicillin (AMOXIL) 875 MG tablet    Sig: Take 1 tablet (875 mg total) by mouth 2 (two) times daily for 10 days.    Dispense:  20 tablet    Refill:  0      I reviewed the patients updated PMH, FH, and SocHx.    Patient Active Problem List   Diagnosis Date Noted   Lumbar spinal stenosis 05/10/2022    Priority: High   Diet-controlled type 2 diabetes mellitus (HCC) 08/28/2021    Priority: High   Chronic kidney disease (CKD) stage G3a/A1, moderately decreased glomerular filtration rate (GFR) between 45-59 mL/min/1.73 square meter and albuminuria creatinine ratio less than 30 mg/g (HCC) 03/01/2021    Priority: High   Statin myopathy 05/13/2018    Priority: High   Primary insomnia 11/04/2017    Priority: High   Immunosuppressed status (HCC) 10/07/2017    Priority: High   Adenomatous polyp of colon 09/28/2017    Priority: High   Combined hyperlipidemia associated with type 2 diabetes mellitus (HCC) 03/10/2013    Priority: High   Primary osteoarthritis of knees, bilateral 03/01/2013  Priority: High   Chronic back pain 10/13/2012    Priority: High   History of liver transplant (HCC) 02/22/2009    Priority: High   Migraine with aura and without status migrainosus, not intractable 07/03/2018    Priority: Medium    Cervical facet joint syndrome 03/28/2018    Priority: Medium    Spondylosis of cervical region without myelopathy or radiculopathy 03/28/2018    Priority: Medium    Lumbar facet arthropathy 12/27/2017    Priority: Medium    Cervical stenosis of spine 01/25/2017    Priority: Medium     Fibromyalgia 08/24/2013    Priority: Medium    Hx of tuberculosis 10/13/2012    Priority: Medium    Anxiety disorder 09/16/2007    Priority: Medium    AS (sickle cell trait) (HCC) 06/29/2019    Priority: Low   Current Meds  Medication Sig   amoxicillin (AMOXIL) 875 MG tablet Take 1 tablet (875 mg total) by mouth 2 (two) times daily for 10 days.   cyclobenzaprine (FLEXERIL) 10 MG tablet TAKE 1 TABLET BY MOUTH THREE TIMES DAILY AS NEEDED   diclofenac Sodium (VOLTAREN) 1 % GEL Apply 4 g topically 4 (four) times daily as needed.   fluticasone (FLONASE) 50 MCG/ACT nasal spray SHAKE LIQUID AND USE 2 SPRAYS IN EACH NOSTRIL DAILY   levocetirizine (XYZAL) 5 MG tablet Take 1 tablet (5 mg total) by mouth every evening.   levocetirizine (XYZAL) 5 MG tablet Take 1 tablet (5 mg total) by mouth every evening.   mycophenolate (CELLCEPT) 500 MG tablet Take 500 mg by mouth 2 (two) times daily.   olopatadine (PATANOL) 0.1 % ophthalmic solution Place 1 drop into both eyes 2 (two) times daily.   Omega-3 Fatty Acids (FISH OIL) 1000 MG CAPS Take 1 capsule (1,000 mg total) by mouth daily.   predniSONE (DELTASONE) 1 MG tablet Take 5 mg by mouth daily.   tacrolimus (PROGRAF) 1 MG capsule Take 2 capsules (2 mg total) by mouth 2 (two) times daily.   [DISCONTINUED] doxycycline (VIBRA-TABS) 100 MG tablet Take 1 tablet (100 mg total) by mouth 2 (two) times daily for 7 days.    Allergies: Patient is allergic to erythromycin, ezetimibe, other, rofecoxib, statins, zonisamide, carvacrol, elavil [amitriptyline], metoprolol, and nsaids. Family History: Patient family history includes Alzheimer's disease in her mother; Cancer in her sister; Dementia in her mother; Diabetes in her brother and mother; Gout in her mother; Heart disease in her sister; Hypertension in her mother and sister; Stroke in her mother. Social History:  Patient  reports that she has never smoked. She has never used smokeless tobacco. She reports that  she does not drink alcohol and does not use drugs.  Review of Systems: Constitutional: Negative for fever malaise or anorexia Cardiovascular: negative for chest pain Respiratory: negative for SOB or persistent cough Gastrointestinal: negative for abdominal pain  Objective  Vitals: BP (!) 148/106   Pulse (!) 103   Temp 98.3 F (36.8 C)   Ht 5\' 5"  (1.651 m)   Wt 187 lb 12.8 oz (85.2 kg)   SpO2 97%   BMI 31.25 kg/m  General: no acute distress , A&Ox3 HEENT: PEERL, right lid comes to the level of her pupil, mild puffiness around the eye, bilateral maxillary tenderness is present.  Conjunctiva normal, neck is supple Cardiovascular:  RRR without murmur or gallop.  Respiratory:  Good breath sounds bilaterally, CTAB with normal respiratory effort Skin:  Warm, no rashes  Commons side effects, risks,  benefits, and alternatives for medications and treatment plan prescribed today were discussed, and the patient expressed understanding of the given instructions. Patient is instructed to call or message via MyChart if he/she has any questions or concerns regarding our treatment plan. No barriers to understanding were identified. We discussed Red Flag symptoms and signs in detail. Patient expressed understanding regarding what to do in case of urgent or emergency type symptoms.  Medication list was reconciled, printed and provided to the patient in AVS. Patient instructions and summary information was reviewed with the patient as documented in the AVS. This note was prepared with assistance of Dragon voice recognition software. Occasional wrong-word or sound-a-like substitutions may have occurred due to the inherent limitations of voice recognition software

## 2022-10-16 NOTE — Progress Notes (Signed)
Rubin Payor, PhD, LAT, ATC acting as a scribe for [name], MD.  Cassandra [name] is a 63 y.o. female who presents to [facility] at [facility]  today for cont'd low back pain. Pt was last seen by Dr. Denyse Amass on 09/28/22 for LBP and was given a R SI joint steroid injection and her oxycodone was refilled.  She had a left L5 nerve root block and transforaminal epidural steroid injection on December 15th and an ESI on 06/07/22. Today, pt reports worsening sx with the bad weather. Notes being seen last week for sinus sx, prescribed Doxycycline, had allergic reaction - cramping in B LE. Would like to discuss ESI but expresses hesitancy.    Her pain is located in her right low back right lateral hip and does have some pain radiating down the right leg.  Her worst pain is right lateral hip.  Dx imaging: 09/17/22 L-spine & R hip XR 05/06/22 L-spine MRI 04/24/22 L-spine XR  Pertinent review of systems: No fevers or chills  Relevant historical information: Liver transplant   Exam:  BP 138/88   Pulse (!) 104   Ht 5\' 5"  (1.651 m)   Wt 191 lb 3.2 oz (86.7 kg)   SpO2 97%   BMI 31.82 kg/m  General: Well Developed, well nourished, and in no acute distress.   MSK: Right hip: Normal-appearing Normal hip motion. Tender palpation greater trochanter.  Hip abduction and external rotation strength are painful and weak at 4/5. Mild antalgic gait    Lab and Radiology Results  Hip greater trochanteric injection: right Consent obtained and timeout performed. Area of maximum tenderness palpated and identified. Skin cleaned with alcohol, cold spray applied. A 22g needle was used to access the greater trochanteric bursa. 40mg  of Kenalog and 2 mL of Marcaine were used to inject the trochanteric bursa. Patient tolerated the procedure well.  Narrative & Impression  CLINICAL DATA:  Lower back and right hip pain for 1 week.   EXAM: DG HIP (WITH OR WITHOUT PELVIS) 2-3V RIGHT    COMPARISON:  Pelvis and left hip radiographs 11/28/2021   FINDINGS: Mildly decreased bone mineralization. Mild bilateral sacroiliac subchondral sclerosis without significant joint space narrowing. Mild bilateral femoroacetabular joint space narrowing and mild bilateral peripheral acetabular degenerative osteophytosis. Mild right femoral head-neck junction degenerative osteophytosis seen on lateral view.   No acute fracture or dislocation. Vascular phleboliths overlie the pelvis.   IMPRESSION: Mild bilateral femoroacetabular osteoarthritis.     Electronically Signed   By: Neita Garnet M.D.   On: 09/18/2022 16:56    I, [name], personally (independently) visualized and performed the interpretation of the images attached in this note.    Assessment and Plan: 62 y.o. female with right lateral hip pain.  Pain thought to be due predominantly to greater trochanteric bursitis.  Plan for steroid injection today at greater trochanter bursa.  She does have multifactorial pain.  I believe some of the pain is due to SI joint dysfunction which was injected last visit.  She also likely has some lumbar radiculopathy.  If today's injection is not sufficient next step is probably an epidural steroid injection.  I can order that with a phone call or MyChart message.  Oxycodone refilled.  PDMP reviewed during this encounter. No orders of the defined types were placed in this encounter.  Meds ordered this encounter  Medications   oxyCODONE-acetaminophen (PERCOCET/ROXICET) 5-325 MG tablet    Sig: Take 1 tablet by mouth every 8 (eight)  hours as needed for severe pain.    Dispense:  15 tablet    Refill:  0     Discussed warning signs or symptoms. Please see discharge instructions. Patient expresses understanding.   The above documentation has been reviewed and is accurate and complete [name], M.D.

## 2022-10-16 NOTE — Patient Instructions (Addendum)
Thank you for coming in today.   You received an injection today. Seek immediate medical attention if the joint becomes red, extremely painful, or is oozing fluid.   If not better, let me know and I can order a back injection

## 2022-10-17 ENCOUNTER — Ambulatory Visit: Payer: Medicare Other | Admitting: Family Medicine

## 2022-10-30 DIAGNOSIS — Z944 Liver transplant status: Secondary | ICD-10-CM | POA: Diagnosis not present

## 2022-10-30 DIAGNOSIS — D849 Immunodeficiency, unspecified: Secondary | ICD-10-CM | POA: Diagnosis not present

## 2022-11-06 ENCOUNTER — Telehealth: Payer: Self-pay | Admitting: Family Medicine

## 2022-11-06 ENCOUNTER — Other Ambulatory Visit: Payer: Self-pay

## 2022-11-06 DIAGNOSIS — H02401 Unspecified ptosis of right eyelid: Secondary | ICD-10-CM

## 2022-11-06 NOTE — Telephone Encounter (Signed)
Patient called requesting to be referred to an ophthalmologist. States that she believes eye issues discussed @ 5/14 visit is not allergy related now. Please Advise.

## 2022-11-06 NOTE — Telephone Encounter (Signed)
Referral has been placed. 

## 2022-11-14 DIAGNOSIS — H25813 Combined forms of age-related cataract, bilateral: Secondary | ICD-10-CM | POA: Diagnosis not present

## 2022-11-14 DIAGNOSIS — H35721 Serous detachment of retinal pigment epithelium, right eye: Secondary | ICD-10-CM | POA: Diagnosis not present

## 2022-11-14 DIAGNOSIS — H538 Other visual disturbances: Secondary | ICD-10-CM | POA: Diagnosis not present

## 2022-11-14 DIAGNOSIS — S0993XS Unspecified injury of face, sequela: Secondary | ICD-10-CM | POA: Diagnosis not present

## 2022-11-15 ENCOUNTER — Encounter: Payer: Self-pay | Admitting: Family Medicine

## 2022-11-16 DIAGNOSIS — D849 Immunodeficiency, unspecified: Secondary | ICD-10-CM | POA: Diagnosis not present

## 2022-11-16 DIAGNOSIS — Z944 Liver transplant status: Secondary | ICD-10-CM | POA: Diagnosis not present

## 2022-12-11 DIAGNOSIS — R5383 Other fatigue: Secondary | ICD-10-CM | POA: Diagnosis not present

## 2022-12-11 DIAGNOSIS — Z944 Liver transplant status: Secondary | ICD-10-CM | POA: Diagnosis not present

## 2022-12-11 DIAGNOSIS — D849 Immunodeficiency, unspecified: Secondary | ICD-10-CM | POA: Diagnosis not present

## 2023-01-30 ENCOUNTER — Encounter: Payer: Self-pay | Admitting: Family Medicine

## 2023-01-30 ENCOUNTER — Ambulatory Visit (INDEPENDENT_AMBULATORY_CARE_PROVIDER_SITE_OTHER): Payer: Medicare Other | Admitting: Family Medicine

## 2023-01-30 VITALS — BP 144/86 | HR 108 | Temp 98.1°F | Ht 65.0 in | Wt 193.0 lb

## 2023-01-30 DIAGNOSIS — M25461 Effusion, right knee: Secondary | ICD-10-CM | POA: Diagnosis not present

## 2023-01-30 DIAGNOSIS — M170 Bilateral primary osteoarthritis of knee: Secondary | ICD-10-CM | POA: Diagnosis not present

## 2023-01-30 NOTE — Patient Instructions (Signed)
Please return in December for your complete physical, come fasting.  If you have any questions or concerns, please don't hesitate to send me a message via MyChart or call the office at [phone]. Thank you for visiting with [place] today! It's our pleasure caring for you.
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
## 2023-02-19 DIAGNOSIS — D849 Immunodeficiency, unspecified: Secondary | ICD-10-CM | POA: Diagnosis not present

## 2023-02-19 DIAGNOSIS — Z944 Liver transplant status: Secondary | ICD-10-CM | POA: Diagnosis not present

## 2023-02-21 ENCOUNTER — Ambulatory Visit: Payer: Medicare Other | Admitting: Family Medicine

## 2023-04-01 ENCOUNTER — Encounter: Payer: Self-pay | Admitting: Family Medicine

## 2023-04-01 ENCOUNTER — Ambulatory Visit (INDEPENDENT_AMBULATORY_CARE_PROVIDER_SITE_OTHER): Payer: Medicare Other | Admitting: Family Medicine

## 2023-04-01 VITALS — BP 136/88 | HR 95 | Temp 97.6°F | Ht 65.0 in | Wt 195.2 lb

## 2023-04-01 DIAGNOSIS — M1711 Unilateral primary osteoarthritis, right knee: Secondary | ICD-10-CM | POA: Diagnosis not present

## 2023-04-01 DIAGNOSIS — E119 Type 2 diabetes mellitus without complications: Secondary | ICD-10-CM | POA: Diagnosis not present

## 2023-04-01 DIAGNOSIS — M25461 Effusion, right knee: Secondary | ICD-10-CM | POA: Diagnosis not present

## 2023-04-01 LAB — POCT GLYCOSYLATED HEMOGLOBIN (HGB A1C): Hemoglobin A1C: 6.2 % — AB (ref 4.0–5.6)

## 2023-04-01 MED ORDER — TRIAMCINOLONE ACETONIDE 40 MG/ML IJ SUSP
40.0000 mg | Freq: Once | INTRAMUSCULAR | Status: AC
  Administered 2023-04-01: 40 mg via INTRAMUSCULAR

## 2023-04-01 NOTE — Patient Instructions (Signed)
Please follow up as scheduled for your next visit with me: 05/15/2023   If you have any questions or concerns, please don't hesitate to send me a message via MyChart or call the office at [phone]. Thank you for visiting with [place] today! It's our pleasure caring for you.

## 2023-04-01 NOTE — Progress Notes (Signed)
Subjective  CC:  Chief Complaint  Patient presents with   Knee Pain    HPI: Cassandra [name] is a 62 y.o. female who presents to the office today to address the problems listed above in the chief complaint.  She has OA of the  Right knee(s) and is having pain that impedes her quality of life and activity level. See past notes for further documentation. No contraindications to steroids or Synvisc are identified. We have used alternative measures for pain control including tylenol, nsaids, ice, rest, and other prescribed analgesics with variable success. Has effusion. See last note. Increased pain.   I reviewed the patients updated PMH, FH, and SocHx.    Patient Active Problem List   Diagnosis Date Noted   Lumbar spinal stenosis 05/10/2022    Priority: High   Diet-controlled type 2 diabetes mellitus (HCC) 08/28/2021    Priority: High   Chronic kidney disease (CKD) stage G3a/A1, moderately decreased glomerular filtration rate (GFR) between 45-59 mL/min/1.73 square meter and albuminuria creatinine ratio less than 30 mg/g (HCC) 03/01/2021    Priority: High   Statin myopathy 05/13/2018    Priority: High   Primary insomnia 11/04/2017    Priority: High   Immunosuppressed status (HCC) 10/07/2017    Priority: High   Adenomatous polyp of colon 09/28/2017    Priority: High   Combined hyperlipidemia associated with type 2 diabetes mellitus (HCC) 03/10/2013    Priority: High   Primary osteoarthritis of knees, bilateral 03/01/2013    Priority: High   Chronic back pain 10/13/2012    Priority: High   History of liver transplant (HCC) 02/22/2009    Priority: High   Migraine with aura and without status migrainosus, not intractable 07/03/2018    Priority: Medium    Cervical facet joint syndrome 03/28/2018    Priority: Medium    Spondylosis of cervical region without myelopathy or radiculopathy 03/28/2018    Priority: Medium    Lumbar facet arthropathy 12/27/2017    Priority: Medium     Cervical stenosis of spine 01/25/2017    Priority: Medium    Fibromyalgia 08/24/2013    Priority: Medium    Hx of tuberculosis 10/13/2012    Priority: Medium    Anxiety disorder 09/16/2007    Priority: Medium    AS (sickle cell trait) (HCC) 06/29/2019    Priority: Low   Current Meds  Medication Sig   cyclobenzaprine (FLEXERIL) 10 MG tablet TAKE 1 TABLET BY MOUTH THREE TIMES DAILY AS NEEDED   diclofenac Sodium (VOLTAREN) 1 % GEL Apply 4 g topically 4 (four) times daily as needed.   fluticasone (FLONASE) 50 MCG/ACT nasal spray SHAKE LIQUID AND USE 2 SPRAYS IN EACH NOSTRIL DAILY   levocetirizine (XYZAL) 5 MG tablet Take 1 tablet (5 mg total) by mouth every evening.   levocetirizine (XYZAL) 5 MG tablet Take 1 tablet (5 mg total) by mouth every evening.   mycophenolate (CELLCEPT) 500 MG tablet Take 500 mg by mouth 2 (two) times daily.   olopatadine (PATANOL) 0.1 % ophthalmic solution Place 1 drop into both eyes 2 (two) times daily.   Omega-3 Fatty Acids (FISH OIL) 1000 MG CAPS Take 1 capsule (1,000 mg total) by mouth daily.   predniSONE (DELTASONE) 1 MG tablet Take 5 mg by mouth daily.   tacrolimus (PROGRAF) 1 MG capsule Take 2 capsules (2 mg total) by mouth 2 (two) times daily.    Allergies: Patient is allergic to erythromycin, ezetimibe, other, rofecoxib, statins, zonisamide, carvacrol, doxycycline,  elavil [amitriptyline], metoprolol, and nsaids.  Review of Systems: Constitutional: Negative for fever malaise or anorexia Cardiovascular: negative for chest pain Respiratory: negative for SOB or persistent cough Gastrointestinal: negative for abdominal pain  Objective  Vitals: BP 136/88   Pulse 95   Temp 97.6 F (36.4 C)   Ht 5\' 5"  (1.651 m)   Wt 195 lb 3.2 oz (88.5 kg)   SpO2 99%   BMI 32.48 kg/m  General: no acute distress , A&Ox3 Cardiovascular:  RRR without murmur  Knee:  Right effusion w/ warmth, no redness, OA changes present Skin:  Warm, no rashes  Procedure note for  Knee Joint Aspiration and/or Injection:  Indications: pain relief, failed conservative therapies  Knee Arthrocentesis with Aspiration and Injection Procedure Note  Pre-operative Diagnosis: right knee effusion/OA with pain  Post-operative Diagnosis: same  Indications: Symptomatic relief of large effusion and Symptom relief from osteoarthritis  Anesthesia: Lidocaine 1% without epinephrine without added sodium bicarbonate  Procedure Details   Verbal consent was obtained for the procedure. Universal time out taken.  The Knee joint was prepped with alcohol and an 18 gauge needle was inserted into the joint from the lateral approach.  30 ml of clear yellow fluid was removed from the joint and discarded. Four ml 1% lidocaine and one ml of triamcinolone (KENALOG) 40mg /ml was then injected into the joint through the same needle. The needle was removed and the area cleansed and dressed.  Complications:  None; patient tolerated the procedure well.   Assessment  1. Primary osteoarthritis of right knee   2. Effusion of right knee joint   3. Diet-controlled type 2 diabetes mellitus (HCC)      Plan  S/p intra-articular knee joint aspiration and steroid injection:  Routine post procedure care discussed. Rest, Ice, Nsaids as needed and ROM exercises recommended. F/u care printed out for patient in AVS.  Discussed urgent f/u if develops increased pain, redness or fever.   Follow up: Return for as scheduled.   Commons side effects, risks, benefits, and alternatives for medications and treatment plan prescribed today were discussed, and the patient expressed understanding of the given instructions. Patient is instructed to call or message via MyChart if he/she has any questions or concerns regarding our treatment plan. No barriers to understanding were identified. We discussed Red Flag symptoms and signs in detail. Patient expressed understanding regarding what to do in case of urgent or emergency type  symptoms.  Medication list was reconciled, printed and provided to the patient in AVS. Patient instructions and summary information was reviewed with the patient as documented in the AVS. This note was prepared with assistance of Dragon voice recognition software. Occasional wrong-word or sound-a-like substitutions may have occurred due to the inherent limitations of voice recognition software  Orders Placed This Encounter  Procedures   POCT HgB A1C   No orders of the defined types were placed in this encounter.

## 2023-04-05 ENCOUNTER — Ambulatory Visit
Admission: RE | Admit: 2023-04-05 | Discharge: 2023-04-05 | Disposition: A | Discharge status: Home / Self Care | Payer: Medicare Other | Source: Ambulatory Visit | Attending: Family Medicine | Admitting: Family Medicine

## 2023-04-05 DIAGNOSIS — Z7952 Long term (current) use of systemic steroids: Secondary | ICD-10-CM

## 2023-04-05 DIAGNOSIS — Z9189 Other specified personal risk factors, not elsewhere classified: Secondary | ICD-10-CM

## 2023-04-22 DIAGNOSIS — D849 Immunodeficiency, unspecified: Secondary | ICD-10-CM | POA: Diagnosis not present

## 2023-04-22 DIAGNOSIS — Z944 Liver transplant status: Secondary | ICD-10-CM | POA: Diagnosis not present

## 2023-04-30 ENCOUNTER — Ambulatory Visit: Payer: Medicare Other | Admitting: Family Medicine

## 2023-04-30 ENCOUNTER — Encounter: Payer: Self-pay | Admitting: Family Medicine

## 2023-04-30 VITALS — BP 107/76 | HR 112 | Temp 97.2°F | Resp 18 | Ht 65.0 in | Wt 192.4 lb

## 2023-04-30 DIAGNOSIS — R059 Cough, unspecified: Secondary | ICD-10-CM

## 2023-04-30 DIAGNOSIS — Z944 Liver transplant status: Secondary | ICD-10-CM

## 2023-04-30 DIAGNOSIS — J069 Acute upper respiratory infection, unspecified: Secondary | ICD-10-CM

## 2023-04-30 DIAGNOSIS — R0981 Nasal congestion: Secondary | ICD-10-CM

## 2023-04-30 LAB — POCT INFLUENZA A/B
Influenza A, POC: NEGATIVE
Influenza B, POC: NEGATIVE

## 2023-04-30 LAB — POC COVID19 BINAXNOW: SARS Coronavirus 2 Ag: NEGATIVE

## 2023-04-30 MED ORDER — CEFDINIR 300 MG PO CAPS: 300.0000 mg | ORAL_CAPSULE | Freq: Two times a day (BID) | ORAL | 0 refills | Status: DC

## 2023-04-30 MED ORDER — ALBUTEROL SULFATE HFA 108 (90 BASE) MCG/ACT IN AERS: 2.0000 | INHALATION_SPRAY | Freq: Four times a day (QID) | RESPIRATORY_TRACT | 0 refills | Status: DC | PRN

## 2023-04-30 NOTE — Patient Instructions (Signed)
It was very nice to see you today!  Drink plenty of fluids, try to eat something Worse, ER  Antibiotics sent.    PLEASE NOTE:  If you had any lab tests please let us know if you have not heard back within a few days. You may see your results on MyChart before we have a chance to review them but we will give you a call once they are reviewed by [place]. If we ordered any referrals today, please let us know if you have not heard from their office within the next week.   Please try these tips to maintain a healthy lifestyle:  Eat most of your calories during the day when you are active. Eliminate processed foods including packaged sweets (pies, cakes, cookies), reduce intake of potatoes, white bread, white pasta, and white rice. Look for whole grain options, oat flour or almond flour.  Each meal should contain half fruits/vegetables, one quarter protein, and one quarter carbs (no bigger than a computer mouse).  Cut down on sweet beverages. This includes juice, soda, and sweet tea. Also watch fruit intake, though this is a healthier sweet option, it still contains natural sugar! Limit to 3 servings daily.  Drink at least 1 glass of water with each meal and aim for at least 8 glasses per day  Exercise at least 150 minutes every week.

## 2023-04-30 NOTE — Progress Notes (Signed)
Subjective:     Patient ID: Cassandra [name], female    DOB: 10-29-1960, 62 y.o.   MRN: [id number]  Chief Complaint  Patient presents with   Cough    Sx started 4 days ago, non-productive cough Neg covid test yesterday   Nasal Congestion    HPI  Cough / Nasal congestion - Pt complains of a dry cough, nasal congestion, occasional dizziness, fever, and decreased appetite starting 4 days ago. At-home Covid test was negative last night. She also endorses shortness of breath starting yesterday. It has been difficult to talk due to constant dry coughing. Overall her cough has improved since onset of sx. Pt denies prior hx of asthma. Pt had a fever on Friday and Saturday, reports a fever of 103. Endorses dry heaving on Saturday and vomiting between Saturday night and Sunday morning. She started taking Mucinex and Xyzal on Saturday. Pt has been drinking plenty of water, OJ, soup. Denies any palpitations, diarrhea, or hx of diabetes. She also mentions her dog has also been sick. Per pt, her pulse is not usually elevated.   Liver transplant patient - Currently on immunosuppressants. She reports recent labs obtained from [facility] revealed elevated liver tests.    Health Maintenance Due  Topic Date Due   Diabetic kidney evaluation - eGFR measurement  05/11/2023   Past Medical History:  Diagnosis Date   Anxiety    AS (sickle cell trait) (HCC) 06/29/2019   Autoimmune hepatitis (HCC)    Chronic renal insufficiency    Cirrhosis (HCC)    COLONIC POLYPS, ADENOMATOUS, HX OF 05/05/2008   Qualifier: Diagnosis of  By: [name], [name]     Depression    External hemorrhoids    Fibromyalgia    GERD (gastroesophageal reflux disease)    Hyperlipidemia    Internal hemorrhoids    Iron deficiency anemia    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   Qualifier: Diagnosis of  By: [name], [name]     Migraine    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    Ruptured disc, cervical     multiple levels   Statin intolerance 05/13/2018   Past Surgical History:  Procedure Laterality Date   ABDOMINAL HYSTERECTOMY  2010   KNEE ARTHROSCOPY Right 2010   LIVER TRANSPLANT  1992   ROTATOR CUFF REPAIR Left 2010   x2    Current Outpatient Medications:    albuterol (VENTOLIN HFA) 108 (90 Base) MCG/ACT inhaler, Inhale 2 puffs into the lungs every 6 (six) hours as needed for wheezing or shortness of breath., Disp: 8 g, Rfl: 0   cefdinir (OMNICEF) 300 MG capsule, Take 1 capsule (300 mg total) by mouth 2 (two) times daily., Disp: 14 capsule, Rfl: 0   cyclobenzaprine (FLEXERIL) 10 MG tablet, TAKE 1 TABLET BY MOUTH THREE TIMES DAILY AS NEEDED, Disp: 90 tablet, Rfl: 0   diclofenac Sodium (VOLTAREN) 1 % GEL, Apply 4 g topically 4 (four) times daily as needed., Disp: 350 g, Rfl: 3   fluticasone (FLONASE) 50 MCG/ACT nasal spray, SHAKE LIQUID AND USE 2 SPRAYS IN EACH NOSTRIL DAILY, Disp: 16 g, Rfl: 6   levocetirizine (XYZAL) 5 MG tablet, Take 1 tablet (5 mg total) by mouth every evening., Disp: 90 tablet, Rfl: 0   magnesium oxide (MAG-OX) 400 MG tablet, Take by mouth., Disp: , Rfl:    mycophenolate (CELLCEPT) 500 MG tablet, Take 500 mg by mouth 2 (two) times daily., Disp: , Rfl:    olopatadine (PATANOL) 0.1 %  ophthalmic solution, Place 1 drop into both eyes 2 (two) times daily., Disp: 5 mL, Rfl: 3   Omega-3 Fatty Acids (FISH OIL) 1000 MG CAPS, Take 1 capsule (1,000 mg total) by mouth daily., Disp: 90 capsule, Rfl: 3   predniSONE (DELTASONE) 1 MG tablet, Take 5 mg by mouth daily., Disp: , Rfl:    tacrolimus (PROGRAF) 1 MG capsule, Take 2 capsules (2 mg total) by mouth 2 (two) times daily., Disp: , Rfl:   Allergies  Allergen Reactions   Erythromycin Diarrhea    REACTION: Nausea/vomiting   Ezetimibe Other (See Comments)    Muscle Cramps Nausea Diarrhea   Other Nausea Only and Other (See Comments)    Knocks her out, or makes her sleepy and does not take them. Muscle  Cramps Nausea Diarrhea Uncoded Allergy. Allergen: vioxx   Rofecoxib Other (See Comments)    Pt not sure but knows she can not take it. REACTION: Nausea/vomiting   Statins Other (See Comments)    myalgias   Zonisamide Other (See Comments)    Fogginess Fogginess    Carvacrol     Other reaction(s): Muscle Pain   Doxycycline Other (See Comments)    LE cramping   Elavil [Amitriptyline] Other (See Comments)    groggy   Metoprolol     Other reaction(s): Muscle Pain   Nsaids     REACTION: GI Cramping/nausea   ROS neg/noncontributory except as noted HPI/below    Objective:    BP 107/76   Pulse (!) 112   Temp (!) 97.2 F (36.2 C) (Temporal)   Resp 18   Ht 5\' 5"  (1.651 m)   Wt 192 lb 6 oz (87.3 kg)   SpO2 100%   BMI 32.01 kg/m  Wt Readings from Last 3 Encounters:  04/30/23 192 lb 6 oz (87.3 kg)  04/01/23 195 lb 3.2 oz (88.5 kg)  01/30/23 193 lb (87.5 kg)   Physical Exam   Gen: WDWN NAD-non-toxic HEENT: NCAT, conjunctiva not injected, sclera nonicteric. +Left TM slightly dull. Right TM WNL, OP moist, no exudates.  NECK:  supple, no thyromegaly, no nodes, no carotid bruits CARDIAC: +Tachycardia, RRR, S1S2+, no murmur.  LUNGS: CTAB. No wheezes EXT:  no edema MSK: no gross abnormalities.  NEURO: A&O x3.  CN II-XII intact.  PSYCH: normal mood. Good eye contact  Reviewed labs from 11/15 from [facility] - labs obtained prior to onset of current symptoms.   Results for orders placed or performed in visit on 04/30/23  POC COVID-19  Result Value Ref Range   SARS Coronavirus 2 Ag Negative Negative  POCT Influenza A/B  Result Value Ref Range   Influenza A, POC Negative Negative   Influenza B, POC Negative Negative      Assessment & Plan:  Upper respiratory tract infection, unspecified type  Cough, unspecified type -     POC COVID-19 BinaxNow -     POCT Influenza A/B  Nasal congestion -     POC COVID-19 BinaxNow -     POCT Influenza A/B  History of liver transplant  (HCC)  Other orders -     Cefdinir; Take 1 capsule (300 mg total) by mouth 2 (two) times daily.  Dispense: 14 capsule; Refill: 0 -     Albuterol Sulfate HFA; Inhale 2 puffs into the lungs every 6 (six) hours as needed for wheezing or shortness of breath.  Dispense: 8 g; Refill: 0   URI-pt weak as well. Is a liver xplant pt on immunosuppressants.  Will tx w/omnicef 300mg  bid and albuterol 2 puffs q 6h prn.  Get plenty of fluids.  Worse, weaker, don't hesitate to go ER.  Feels like she can manage at home right now  Return if symptoms worsen or fail to improve.   I, Isabelle Course, acting as a scribe for Angelena Sole, MD., have documented all relevant documentation on the behalf of Angelena Sole, MD, as directed by  Angelena Sole, MD while in the presence of Angelena Sole, MD.  I, Angelena Sole, MD, have reviewed all documentation for this visit. The documentation on 04/30/23 for the exam, diagnosis, procedures, and orders are all accurate and complete.  Angelena Sole, MD

## 2023-05-15 ENCOUNTER — Encounter: Payer: Self-pay | Admitting: Family Medicine

## 2023-05-15 ENCOUNTER — Ambulatory Visit: Payer: Medicare Other | Admitting: Family Medicine

## 2023-05-15 VITALS — BP 138/88 | HR 85 | Temp 97.6°F | Ht 65.0 in | Wt 191.6 lb

## 2023-05-15 DIAGNOSIS — Z13820 Encounter for screening for osteoporosis: Secondary | ICD-10-CM | POA: Insufficient documentation

## 2023-05-15 DIAGNOSIS — D573 Sickle-cell trait: Secondary | ICD-10-CM

## 2023-05-15 DIAGNOSIS — G8929 Other chronic pain: Secondary | ICD-10-CM

## 2023-05-15 DIAGNOSIS — G720 Drug-induced myopathy: Secondary | ICD-10-CM

## 2023-05-15 DIAGNOSIS — Z0001 Encounter for general adult medical examination with abnormal findings: Secondary | ICD-10-CM

## 2023-05-15 DIAGNOSIS — Z944 Liver transplant status: Secondary | ICD-10-CM

## 2023-05-15 DIAGNOSIS — E782 Mixed hyperlipidemia: Secondary | ICD-10-CM | POA: Diagnosis not present

## 2023-05-15 DIAGNOSIS — E1169 Type 2 diabetes mellitus with other specified complication: Secondary | ICD-10-CM

## 2023-05-15 DIAGNOSIS — E119 Type 2 diabetes mellitus without complications: Secondary | ICD-10-CM

## 2023-05-15 DIAGNOSIS — N1831 Chronic kidney disease, stage 3a: Secondary | ICD-10-CM | POA: Diagnosis not present

## 2023-05-15 DIAGNOSIS — D849 Immunodeficiency, unspecified: Secondary | ICD-10-CM | POA: Diagnosis not present

## 2023-05-15 DIAGNOSIS — M545 Low back pain, unspecified: Secondary | ICD-10-CM | POA: Diagnosis not present

## 2023-05-15 DIAGNOSIS — M797 Fibromyalgia: Secondary | ICD-10-CM | POA: Diagnosis not present

## 2023-05-15 DIAGNOSIS — Z0000 Encounter for general adult medical examination without abnormal findings: Secondary | ICD-10-CM | POA: Diagnosis not present

## 2023-05-15 LAB — COMPREHENSIVE METABOLIC PANEL
ALT: 43 U/L — ABNORMAL HIGH (ref 0–35)
AST: 38 U/L — ABNORMAL HIGH (ref 0–37)
Albumin: 4 g/dL (ref 3.5–5.2)
Alkaline Phosphatase: 118 U/L — ABNORMAL HIGH (ref 39–117)
BUN: 28 mg/dL — ABNORMAL HIGH (ref 6–23)
CO2: 23 meq/L (ref 19–32)
Calcium: 9.7 mg/dL (ref 8.4–10.5)
Chloride: 107 meq/L (ref 96–112)
Creatinine, Ser: 1.25 mg/dL — ABNORMAL HIGH (ref 0.40–1.20)
GFR: 46.04 mL/min — ABNORMAL LOW (ref >60.00)
Glucose, Bld: 109 mg/dL — ABNORMAL HIGH (ref 70–99)
Potassium: 4 meq/L (ref 3.5–5.1)
Sodium: 138 meq/L (ref 135–145)
Total Bilirubin: 0.7 mg/dL (ref 0.2–1.2)
Total Protein: 7 g/dL (ref 6.0–8.3)

## 2023-05-15 LAB — CBC WITH DIFFERENTIAL/PLATELET
Basophils Absolute: 0 10*3/uL (ref 0.0–0.1)
Basophils Relative: 0.7 % (ref 0.0–3.0)
Eosinophils Absolute: 0.2 10*3/uL (ref 0.0–0.7)
Eosinophils Relative: 2.9 % (ref 0.0–5.0)
HCT: 42.3 % (ref 36.0–46.0)
Hemoglobin: 13.8 g/dL (ref 12.0–15.0)
Lymphocytes Relative: 42.3 % (ref 12.0–46.0)
Lymphs Abs: 2.4 10*3/uL (ref 0.7–4.0)
MCHC: 32.7 g/dL (ref 30.0–36.0)
MCV: 87.8 fL (ref 78.0–100.0)
Monocytes Absolute: 0.7 10*3/uL (ref 0.1–1.0)
Monocytes Relative: 11.7 % (ref 3.0–12.0)
Neutro Abs: 2.5 10*3/uL (ref 1.4–7.7)
Neutrophils Relative %: 42.4 % — ABNORMAL LOW (ref 43.0–77.0)
Platelets: 181 10*3/uL (ref 150.0–400.0)
RBC: 4.82 Mil/uL (ref 3.87–5.11)
RDW: 14.3 % (ref 11.5–15.5)
WBC: 5.8 10*3/uL (ref 4.0–10.5)

## 2023-05-15 LAB — LIPID PANEL
Cholesterol: 271 mg/dL — ABNORMAL HIGH (ref 0–200)
HDL: 55.1 mg/dL (ref >39.00)
LDL Cholesterol: 191 mg/dL — ABNORMAL HIGH (ref 0–99)
NonHDL: 215.74
Total CHOL/HDL Ratio: 5
Triglycerides: 124 mg/dL (ref 0.0–149.0)
VLDL: 24.8 mg/dL (ref 0.0–40.0)

## 2023-05-15 LAB — MICROALBUMIN / CREATININE URINE RATIO
Creatinine,U: 151.2 mg/dL
Microalb Creat Ratio: 1.5 mg/g (ref 0.0–30.0)
Microalb, Ur: 2.3 mg/dL — ABNORMAL HIGH (ref 0.0–1.9)

## 2023-05-15 LAB — TSH: TSH: 1.23 u[IU]/mL (ref 0.35–5.50)

## 2023-05-15 LAB — HEMOGLOBIN A1C: Hgb A1c MFr Bld: 6.6 % — ABNORMAL HIGH (ref 4.6–6.5)

## 2023-05-15 NOTE — Patient Instructions (Signed)
Please return in 6 months for diabetes recheck  I will release your lab results to you on your MyChart account with further instructions. You may see the results before I do, but when I review them I will send you a message with my report or have my assistant call you if things need to be discussed. Please reply to my message with any questions. Thank you!   If you have any questions or concerns, please don't hesitate to send me a message via MyChart or call the office at [phone]. Thank you for visiting with [place] today! It's our pleasure caring for you.   Please do these things to maintain good health!  Exercise at least 30-45 minutes a day,  4-5 days a week.  Eat a low-fat diet with lots of fruits and vegetables, up to 7-9 servings per day. Drink plenty of water daily. Try to drink 8 8oz glasses per day. Seatbelts can save your life. Always wear your seatbelt. Place Smoke Detectors on every level of your home and check batteries every year. Schedule an appointment with an eye doctor for an eye exam every 1-2 years Safe sex - use condoms to protect yourself from STDs if you could be exposed to these types of infections. Use birth control if you do not want to become pregnant and are sexually active. Avoid heavy alcohol use. If you drink, keep it to less than 2 drinks/day and not every day. Health Care Power of Attorney.  Choose someone you trust that could speak for you if you became unable to speak for yourself. Depression is common in our stressful world.If you're feeling down or losing interest in things you normally enjoy, please come in for a visit. If anyone is threatening or hurting you, please get help. Physical or Emotional Violence is never OK.

## 2023-05-15 NOTE — Progress Notes (Signed)
Subjective  Chief Complaint  Patient presents with   Annual Exam   Diabetes    HPI: Cassandra [name] is a 62 y.o. female who presents to [facility] [place] today for a Female Wellness Visit. She also has the concerns and/or needs as listed above in the chief complaint. These will be addressed in addition to the Health Maintenance Visit.   Wellness Visit: annual visit with health maintenance review and exam  HM: mammo due in January. CRC is current. Imms are current and she is doing well overall. Sees transplant docs soon.  Chronic disease f/u and/or acute problem visit: (deemed necessary to be done in addition to the wellness visit): Discussed the use of AI scribe software for clinical note transcription with the patient, who gave verbal consent to proceed.  History of Present Illness   The patient, with a history of diabetes and knee issues, presents for a routine physical. She reports no new health concerns since the last visit. She mentions a recent respiratory issue, characterized by difficulty breathing, but it appears to have resolved with abx treatement. I reviewed note. The patient also reports some recurrence of knee discomfort, particularly during rainy weather, but acknowledges improvement following a recent injection for OA and effusion. See note.  The patient's diabetes has been managed through diet control, and she denies symptoms of hyperglycemia such as blurred vision, increased thirst, or frequent urination. No foot concerns. No retionopathy. She is statin intolerant and not yet needing ace.   Chronic prednisone and immunosuppressed. 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       Assessment  1. Encounter for well adult exam with abnormal findings   2. History of liver transplant (HCC)   3. Immunosuppressed status (HCC)   4. Statin myopathy   5. Combined  hyperlipidemia associated with type 2 diabetes mellitus (HCC)   6. Diet-controlled type 2 diabetes mellitus (HCC)   7. Chronic kidney disease (CKD) stage G3a/A1, moderately decreased glomerular filtration rate (GFR) between 45-59 mL/min/1.73 square meter and albuminuria creatinine ratio less than 30 mg/g (HCC)   8. Chronic midline low back pain without sciatica   9. Fibromyalgia   10. AS (sickle cell trait) [facility])      Plan  Female Wellness Visit: Age appropriate Health Maintenance and Prevention measures were discussed with patient. Included topics are cancer screening recommendations, ways to keep healthy (see AVS) including dietary and exercise recommendations, regular eye and dental care, use of seat belts, and avoidance of moderate alcohol use and tobacco use. Mammo in january BMI: discussed patient's BMI and encouraged positive lifestyle modifications to help get to or maintain a target BMI. HM needs and immunizations were addressed and ordered. See below for orders. See HM and immunization section for updates. Routine labs and screening tests ordered including cmp, cbc and lipids where appropriate. Discussed recommendations regarding Vit D and calcium supplementation (see AVS)  Chronic disease management visit and/or acute problem visit: Assessment and Plan    Knee Pain/OA Intermittent knee pain exacerbated by weather changes. Previous improvement noted after injection, but symptoms have returned with weather changes. Pain improves when weather clears. - improved.  Diabetes Mellitus, Diet-Controlled Diabetes is well-controlled with diet. No symptoms of hyperglycemia reported. Adherence to diet confirmed. - check A1c, renal function and urine nephropathy screen not yet on ace - continue diabetic diet  Liver transplant -To see [facility] liver transplant  specialist for follow-up soon.  Fortunately, remained stable.  Continue on immunosuppressants.  Monitoring liver function  Chronic  kidney disease -Recheck today.  No symptoms  Fibromyalgia and chronic low back pain due to DJD -Fortunately, stable without significant flares recently. -Continue as needed meds  Hyperlipidemia -Fasting for recheck, unfortunately had statin myopathy and did not tolerate Zetia due to side effects.  Continue healthy diet  Follow up: 6 months for recheck diabetes  Orders Placed This Encounter  Procedures   CBC with Differential/Platelet   Comprehensive metabolic panel   Lipid panel   Hemoglobin A1c   TSH   Microalbumin / creatinine urine ratio   No orders of the defined types were placed in this encounter.     Body mass index is 31.88 kg/m. Wt Readings from Last 3 Encounters:  05/15/23 191 lb 9.6 oz (86.9 kg)  04/30/23 192 lb 6 oz (87.3 kg)  04/01/23 195 lb 3.2 oz (88.5 kg)     Patient Active Problem List   Diagnosis Date Noted Date Diagnosed   Lumbar spinal stenosis 05/10/2022     Priority: High   Diet-controlled type 2 diabetes mellitus (HCC) 08/28/2021     Priority: High    Neg urine a/c ratio 08/2021, not on ACE.  Lab Results  Component Value Date   HGBA1C 6.4 05/10/2022   HGBA1C 5.9 (A) 02/14/2022   HGBA1C 6.1 (A) 08/28/2021    A1c 6.5 02/2021    Chronic kidney disease (CKD) stage G3a/A1, moderately decreased glomerular filtration rate (GFR) between 45-59 mL/min/1.73 square meter and albuminuria creatinine ratio less than 30 mg/g (HCC) 03/01/2021     Priority: High   Statin myopathy 05/13/2018     Priority: High   Primary insomnia 11/04/2017     Priority: High   Immunosuppressed status (HCC) 10/07/2017     Priority: High   Adenomatous polyp of colon 09/28/2017     Priority: High    Per [facility] liver clinic found in 2009; followed up in 2011.    Combined hyperlipidemia associated with type 2 diabetes mellitus (HCC) 03/10/2013     Priority: High    Statin myopathy; did not tolerate zetia.    Primary osteoarthritis of knees, bilateral 03/01/2013      Priority: High    Dr. Earma Reading saw in 08/2016 for pain and was considering Synvisc.  High risk for total knee due to immune suppressant post liver transplant. [facility] ortho 2019: severe OA right knee with recurrent effusions. S/p synvisc w/o relief. Holding off on replacement.     Chronic back pain 10/13/2012     Priority: High   History of liver transplant (HCC) 02/22/2009     Priority: High    [facility]:due to AIH with cirrhosis.  1992.     Migraine with aura and without status migrainosus, not intractable 07/03/2018     Priority: Medium    Cervical facet joint syndrome 03/28/2018     Priority: Medium    Spondylosis of cervical region without myelopathy or radiculopathy 03/28/2018     Priority: Medium    Lumbar facet arthropathy 12/27/2017     Priority: Medium    Cervical stenosis of spine 01/25/2017     Priority: Medium    Fibromyalgia 08/24/2013     Priority: Medium    Hx of tuberculosis 10/13/2012     Priority: Medium    Anxiety disorder 09/16/2007     Priority: Medium     Has failed prozac, elavil, zoloft in past.  Used nightly  xanax since 2000; weaned 2019.     AS (sickle cell trait) (HCC) 06/29/2019     Priority: Low   Health Maintenance  Topic Date Due   Diabetic kidney evaluation - eGFR measurement  05/11/2023   Medicare Annual Wellness (AWV)  07/13/2023   MAMMOGRAM  06/17/2023   OPHTHALMOLOGY EXAM  06/28/2023   Diabetic kidney evaluation - Urine ACR  09/20/2023   HEMOGLOBIN A1C  09/30/2023   FOOT EXAM  01/30/2024   DTaP/Tdap/Td (4 - Td or Tdap) 05/04/2030   Colonoscopy  01/25/2031   Hepatitis C Screening  Completed   HIV Screening  Completed   HPV VACCINES  Aged Out   INFLUENZA VACCINE  Discontinued   COVID-19 Vaccine  Discontinued   Zoster Vaccines- Shingrix  Discontinued   Immunization History  Administered Date(s) Administered   Hepatitis A, Adult 09/27/2016, 09/26/2017   Influenza Split 03/05/2012   Influenza, Quadrivalent, Recombinant, Inj, Pf 04/29/2013,  03/28/2016   Influenza, Seasonal, Injecte, Preservative Fre 03/02/2014, 02/28/2015   Influenza,inj,Quad PF,6+ Mos 03/28/2016   Influenza-Unspecified 05/26/2013, 03/02/2014, 02/28/2015, 03/28/2016   PFIZER Comirnaty(Gray Top)Covid-19 Tri-Sucrose Vaccine 08/27/2019, 09/17/2019, 07/19/2020   PFIZER(Purple Top)SARS-COV-2 Vaccination 08/27/2019, 09/17/2019   PNEUMOCOCCAL CONJUGATE-20 09/07/2021   Pneumococcal Conjugate-13 09/22/2015   Pneumococcal Polysaccharide-23 09/27/2016   Tdap 05/17/2008, 12/03/2010   Tetanus 05/04/2020   We updated and reviewed the patient's past history in detail and it is documented below. Allergies: Patient is allergic to erythromycin, ezetimibe, other, rofecoxib, statins, zonisamide, carvacrol, doxycycline, elavil [amitriptyline], metoprolol, and nsaids. Past Medical History Patient  has a past medical history of Anxiety, AS (sickle cell trait) (HCC) (06/29/2019), Autoimmune hepatitis (HCC), Chronic renal insufficiency, Cirrhosis (HCC), COLONIC POLYPS, ADENOMATOUS, HX OF (05/05/2008), Depression, External hemorrhoids, Fibromyalgia, GERD (gastroesophageal reflux disease), Hyperlipidemia, Internal hemorrhoids, Iron deficiency anemia, Liver replaced by transplant (HCC) (02/22/2009), Migraine, 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, Ruptured disc, cervical, and Statin intolerance (05/13/2018). Past Surgical History Patient  has a past surgical history that includes Liver transplant (1992); Knee arthroscopy (Right, 2010); Rotator cuff repair (Left, 2010); and Abdominal hysterectomy (2010). Family History: Patient family history includes Alzheimer's disease in her mother; Cancer in her sister; Dementia in her mother; Diabetes in her brother and mother; Gout in her mother; Heart disease in her sister; Hypertension in her mother and sister; Stroke in her mother. Social History:  Patient  reports that she has never smoked. She has never used smokeless tobacco. She reports that she does  not drink alcohol and does not use drugs.  Review of Systems: Constitutional: negative for fever or malaise Ophthalmic: negative for photophobia, double vision or loss of vision Cardiovascular: negative for chest pain, dyspnea on exertion, or new LE swelling Respiratory: negative for SOB or persistent cough Gastrointestinal: negative for abdominal pain, change in bowel habits or melena Genitourinary: negative for dysuria or gross hematuria, no abnormal uterine bleeding or disharge Musculoskeletal: negative for new gait disturbance or muscular weakness Integumentary: negative for new or persistent rashes, no breast lumps Neurological: negative for TIA or stroke symptoms Psychiatric: negative for SI or delusions Allergic/Immunologic: negative for hives  Patient Care Team    Relationship Specialty Notifications Start End  Willow Ora, MD PCP - General Family Medicine  10/07/17   Napoleon Form, MD Consulting Physician Gastroenterology  11/04/17   Andi Devon  Pain Medicine  11/04/17   Anson Fret, MD Consulting Physician Neurology  05/22/19   Audrie Lia, MD Consulting Physician Internal Medicine  05/22/19   Marletta Lor, PA-C  Physician Assistant  05/22/19     Objective  Vitals: BP 138/88   Pulse 85   Temp 97.6 F (36.4 C)   Ht 5\' 5"  (1.651 m)   Wt 191 lb 9.6 oz (86.9 kg)   SpO2 96%   BMI 31.88 kg/m  General:  Well developed, well nourished, no acute distress  Psych:  Alert and orientedx3,normal mood and affect HEENT:  Normocephalic, atraumatic, non-icteric sclera,  supple neck without adenopathy, mass or thyromegaly Cardiovascular:  Normal S1, S2, RRR without gallop, rub or murmur Respiratory:  Good breath sounds bilaterally, CTAB with normal respiratory effort Gastrointestinal: normal bowel sounds, soft, non-tender, no noted masses. No HSM MSK: extremities without edema, joints without erythema or swelling Neurologic:    Mental status is normal.  Gross motor  and sensory exams are normal.  No tremor  Commons side effects, risks, benefits, and alternatives for medications and treatment plan prescribed today were discussed, and the patient expressed understanding of the given instructions. Patient is instructed to call or message via MyChart if he/she has any questions or concerns regarding our treatment plan. No barriers to understanding were identified. We discussed Red Flag symptoms and signs in detail. Patient expressed understanding regarding what to do in case of urgent or emergency type symptoms.  Medication list was reconciled, printed and provided to the patient in AVS. Patient instructions and summary information was reviewed with the patient as documented in the AVS. This note was prepared with assistance of Dragon voice recognition software. Occasional wrong-word or sound-a-like substitutions may have occurred due to the inherent limitations of voice recognition software

## 2023-05-15 NOTE — Progress Notes (Signed)
See mychart note DEXA 04/2023: T = -0.4: NORMAL. Recheck 5 years.

## 2023-05-16 ENCOUNTER — Telehealth: Payer: Self-pay | Admitting: Family Medicine

## 2023-05-16 MED ORDER — REPATHA 140 MG/ML ~~LOC~~ SOSY: 140.0000 mg | PREFILLED_SYRINGE | SUBCUTANEOUS | 11 refills | Status: DC

## 2023-05-16 NOTE — Telephone Encounter (Signed)
Patient states she wanted PCP to know that she has already tried repatha before but she doesn't want to take that due to having to self-inject. Patient also wanted pcp to know that her cholesterol levels were high due to her eating seafood.

## 2023-05-16 NOTE — Progress Notes (Signed)
See mychart note Dear Ms. Ryker, I have reviewed your lab work.  Most things are stable.  Your diabetic control.  Your liver tests remain a little elevated and your renal function is stable.  However, your cholesterol continues to elevate.  I know you cannot tolerate a statin when ready perhaps next to start different type of medication called Repatha.  It is a an injectable that you give yourself twice a month or every 14 days.  I will order it to see if your insurance approves it.  It works very well.  It is typically very well-tolerated Sincerely, Dr. Mardelle Matte

## 2023-05-16 NOTE — Addendum Note (Signed)
Addended by: Asencion Partridge on: 05/16/2023 01:56 PM   Modules accepted: Orders

## 2023-05-22 DIAGNOSIS — Z944 Liver transplant status: Secondary | ICD-10-CM | POA: Diagnosis not present

## 2023-05-22 DIAGNOSIS — D849 Immunodeficiency, unspecified: Secondary | ICD-10-CM | POA: Diagnosis not present

## 2023-06-25 DIAGNOSIS — Z944 Liver transplant status: Secondary | ICD-10-CM | POA: Diagnosis not present

## 2023-06-25 DIAGNOSIS — D849 Immunodeficiency, unspecified: Secondary | ICD-10-CM | POA: Diagnosis not present

## 2023-07-02 DIAGNOSIS — Z1231 Encounter for screening mammogram for malignant neoplasm of breast: Secondary | ICD-10-CM | POA: Diagnosis not present

## 2023-07-02 LAB — HM MAMMOGRAPHY

## 2023-07-03 ENCOUNTER — Encounter: Payer: Self-pay | Admitting: Family Medicine

## 2023-07-09 DIAGNOSIS — K838 Other specified diseases of biliary tract: Secondary | ICD-10-CM | POA: Diagnosis not present

## 2023-07-09 DIAGNOSIS — Z9049 Acquired absence of other specified parts of digestive tract: Secondary | ICD-10-CM | POA: Diagnosis not present

## 2023-07-09 DIAGNOSIS — Z4823 Encounter for aftercare following liver transplant: Secondary | ICD-10-CM | POA: Diagnosis not present

## 2023-07-09 DIAGNOSIS — Z944 Liver transplant status: Secondary | ICD-10-CM | POA: Diagnosis not present

## 2023-07-09 DIAGNOSIS — R7989 Other specified abnormal findings of blood chemistry: Secondary | ICD-10-CM | POA: Diagnosis not present

## 2023-07-17 ENCOUNTER — Ambulatory Visit: Payer: Medicare Other

## 2023-07-17 VITALS — Wt 191.0 lb

## 2023-07-17 DIAGNOSIS — Z0000 Encounter for general adult medical examination without abnormal findings: Secondary | ICD-10-CM

## 2023-07-17 NOTE — Progress Notes (Signed)
 Subjective:   Cassandra [name] is a 63 y.o. female who presents for Medicare Annual (Subsequent) preventive examination.  Visit Complete: Virtual I connected with  Garey Ham Guerrero on 07/17/23 by a video and audio enabled telemedicine application and verified that I am speaking with the correct person using two identifiers.  Patient Location: Home  Provider Location: Home Office  I discussed the limitations of evaluation and management by telemedicine. The patient expressed understanding and agreed to proceed.  Vital Signs: Because this visit was a virtual/telehealth visit, some criteria may be missing or patient reported. Any vitals not documented were not able to be obtained and vitals that have been documented are patient reported.  Patient Medicare AWV questionnaire was completed by the patient on 07/13/23; I have confirmed that all information answered by patient is correct and no changes since this date.        Objective:    Today's Vitals   07/17/23 1339  Weight: 191 lb (86.6 kg)   Body mass index is 31.78 kg/m.     07/17/2023    1:45 PM 07/12/2022    4:06 PM 01/22/2022    2:48 PM 01/21/2022   11:00 AM 07/27/2021    2:47 PM 07/06/2021   11:37 AM 11/20/2019    9:35 PM  Advanced Directives  Does Patient Have a Medical Advance Directive? Yes No No No Yes Yes No  Type of Estate agent of [place];Living will    Healthcare Power of [employer] Power of Attorney   Copy of Healthcare Power of Attorney in Chart? No - copy requested     No - copy requested   Would patient like information on creating a medical advance directive?  No - Patient declined  No - Patient declined   No - Patient declined    Current Medications (verified) Outpatient Encounter Medications as of 07/17/2023  Medication Sig   albuterol (VENTOLIN HFA) 108 (90 Base) MCG/ACT inhaler Inhale 2 puffs into the lungs every 6 (six) hours as needed for wheezing or shortness of breath.    diclofenac Sodium (VOLTAREN) 1 % GEL Apply 4 g topically 4 (four) times daily as needed.   fluticasone (FLONASE) 50 MCG/ACT nasal spray SHAKE LIQUID AND USE 2 SPRAYS IN EACH NOSTRIL DAILY   magnesium oxide (MAG-OX) 400 MG tablet Take by mouth.   mycophenolate (CELLCEPT) 500 MG tablet Take 500 mg by mouth 2 (two) times daily.   olopatadine (PATANOL) 0.1 % ophthalmic solution Place 1 drop into both eyes 2 (two) times daily.   Omega-3 Fatty Acids (FISH OIL) 1000 MG CAPS Take 1 capsule (1,000 mg total) by mouth daily.   predniSONE (DELTASONE) 1 MG tablet Take 5 mg by mouth daily.   tacrolimus (PROGRAF) 1 MG capsule Take 2 capsules (2 mg total) by mouth 2 (two) times daily.   levocetirizine (XYZAL) 5 MG tablet Take 1 tablet (5 mg total) by mouth every evening.   [DISCONTINUED] cyclobenzaprine (FLEXERIL) 10 MG tablet TAKE 1 TABLET BY MOUTH THREE TIMES DAILY AS NEEDED   [DISCONTINUED] Evolocumab (REPATHA) 140 MG/ML SOSY Inject 140 mg into the skin every 14 (fourteen) days.   No facility-administered encounter medications on file as of 07/17/2023.    Allergies (verified) Erythromycin, Ezetimibe, Other, Rofecoxib, Statins, Zonisamide, Carvacrol, Doxycycline, Elavil [amitriptyline], Metoprolol, and Nsaids   History: Past Medical History:  Diagnosis Date   Anxiety    AS (sickle cell trait) (HCC) 06/29/2019   Autoimmune hepatitis (HCC)    Chronic  renal insufficiency    Cirrhosis (HCC)    COLONIC POLYPS, ADENOMATOUS, HX OF 05/05/2008   Qualifier: Diagnosis of  By: [name], [name]     Depression    External hemorrhoids    Fibromyalgia    GERD (gastroesophageal reflux disease)    Hyperlipidemia    Internal hemorrhoids    Iron deficiency anemia    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   Qualifier: Diagnosis of  By: [name], [name]     Migraine    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    Ruptured disc, cervical    multiple levels   Statin intolerance 05/13/2018   Past  Surgical History:  Procedure Laterality Date   ABDOMINAL HYSTERECTOMY  2010   KNEE ARTHROSCOPY Right 2010   LIVER TRANSPLANT  1992   ROTATOR CUFF REPAIR Left 2010   x2   Family History  Problem Relation Age of Onset   Diabetes Brother    Heart disease Sister    Hypertension Sister    Diabetes Mother    Dementia Mother    Alzheimer's disease Mother    Gout Mother    Hypertension Mother    Stroke Mother    Cancer Sister    Colon cancer Neg Hx    Social History   Socioeconomic History   Marital status: Single    Spouse name: Not on file   Number of children: 1   Years of education: Not on file   Highest education level: Associate degree: academic program  Occupational History   Not on file  Tobacco Use   Smoking status: Never   Smokeless tobacco: Never  Vaping Use   Vaping status: Never Used  Substance and Sexual Activity   Alcohol use: Never    Alcohol/week: 0.0 standard drinks of alcohol   Drug use: Never   Sexual activity: Not on file  Other Topics Concern   Not on file  Social History Narrative   Lives at home alone    Right handed   Caffeine: none   Social Drivers of Health   Financial Resource Strain: Low Risk  (07/13/2023)   Overall Financial Resource Strain (CARDIA)    Difficulty of Paying Living Expenses: Not hard at all  Food Insecurity: Patient Declined (07/13/2023)   Hunger Vital Sign    Worried About Running Out of Food in the Last Year: Patient declined    Ran Out of Food in the Last Year: Patient declined  Transportation Needs: No Transportation Needs (07/13/2023)   PRAPARE - Administrator, Civil Service (Medical): No    Lack of Transportation (Non-Medical): No  Physical Activity: Insufficiently Active (07/13/2023)   Exercise Vital Sign    Days of Exercise per Week: 2 days    Minutes of Exercise per Session: 20 min  Stress: No Stress Concern Present (07/13/2023)   [employer] of Occupational Health - Occupational Stress  Questionnaire    Feeling of Stress : Not at all  Social Connections: Moderately Integrated (07/13/2023)   Social Connection and Isolation Panel [NHANES]    Frequency of Communication with Friends and Family: More than three times a week    Frequency of Social Gatherings with Friends and Family: Three times a week    Attends Religious Services: More than 4 times per year    Active Member of Clubs or Organizations: Yes    Attends Banker Meetings: 1 to 4 times per year    Marital Status: Widowed  Tobacco Counseling Counseling given: Not Answered   Clinical Intake:  Pre-visit preparation completed: Yes  Pain : No/denies pain     BMI - recorded: 31.78 Nutritional Status: BMI > 30  Obese Diabetes: Yes CBG done?: No Did pt. bring in CBG monitor from home?: No  How often do you need to have someone help you when you read instructions, pamphlets, or other written materials from your doctor or pharmacy?: 1 - Never  Interpreter Needed?: No  Information entered by :: Lanier Ensign, LPN   Activities of Daily Living    07/13/2023   11:06 AM  In your present state of health, do you have any difficulty performing the following activities:  Hearing? 0  Vision? 0  Difficulty concentrating or making decisions? 0  Walking or climbing stairs? 1  Dressing or bathing? 0  Doing errands, shopping? 0  Preparing Food and eating ? N  Using the Toilet? N  In the past six months, have you accidently leaked urine? N  Do you have problems with loss of bowel control? N  Managing your Medications? N  Managing your Finances? N  Housekeeping or managing your Housekeeping? N    Patient Care Team: Willow Ora, MD as PCP - General (Family Medicine) Napoleon Form, MD as Consulting Physician (Gastroenterology) Andi Devon (Pain Medicine) Anson Fret, MD as Consulting Physician (Neurology) Audrie Lia, MD as Consulting Physician (Internal Medicine) Jolene Provost (Physician Assistant)  Indicate any recent Medical Services you may have received from other than [facility] providers in the past year (date may be approximate).     Assessment:   This is a routine wellness examination for Cassandra [name].  Hearing/Vision screen Hearing Screening - Comments:: Pt denies any hearing issues  Vision Screening - Comments:: Pt follows up with dr Elmer Picker for annual eye exams    Goals Addressed             This Visit's Progress    Patient Stated       Lose weight        Depression Screen    07/17/2023    1:44 PM 05/15/2023    8:19 AM 04/01/2023   10:48 AM 10/16/2022    9:23 AM 07/12/2022    4:05 PM 07/11/2022    9:09 AM 05/10/2022    1:04 PM  PHQ 2/9 Scores  PHQ - 2 Score 0 0 0 0 0 0 0    Fall Risk    07/13/2023   11:06 AM 05/15/2023    8:19 AM 04/01/2023   10:47 AM 10/16/2022    9:23 AM 09/20/2022   11:36 AM  Fall Risk   Falls in the past year? 0 0 0 0 0  Number falls in past yr:  0 0 0 0  Injury with Fall?  0 0 0 0  Risk for fall due to : No Fall Risks No Fall Risks  No Fall Risks No Fall Risks  Follow up Falls prevention discussed Falls evaluation completed Falls evaluation completed Falls evaluation completed Falls evaluation completed    MEDICARE RISK AT HOME: Medicare Risk at Home Any stairs in or around the home?: (Patient-Rptd) No Home free of loose throw rugs in walkways, pet beds, electrical cords, etc?: (Patient-Rptd) Yes Adequate lighting in your home to reduce risk of falls?: (Patient-Rptd) Yes Life alert?: (Patient-Rptd) No Use of a cane, walker or w/c?: (Patient-Rptd) No Grab bars in the bathroom?: (Patient-Rptd) Yes Shower chair or bench in shower?: (Patient-Rptd)  No Elevated toilet seat or a handicapped toilet?: (Patient-Rptd) Yes  TIMED UP AND GO:  Was the test performed?  No    Cognitive Function:        07/17/2023    1:47 PM 07/12/2022    4:09 PM 07/06/2021   11:40 AM  6CIT Screen  What Year? 0 points 0 points 0  points  What month? 0 points 0 points 0 points  What time? 0 points 0 points 0 points  Count back from 20 0 points 0 points 0 points  Months in reverse 0 points 0 points 0 points  Repeat phrase 0 points 0 points 0 points  Total Score 0 points 0 points 0 points    Immunizations Immunization History  Administered Date(s) Administered   Hepatitis A, Adult 09/27/2016, 09/26/2017   Influenza Split 03/05/2012   Influenza, Quadrivalent, Recombinant, Inj, Pf 04/29/2013, 03/28/2016   Influenza, Seasonal, Injecte, Preservative Fre 03/02/2014, 02/28/2015   Influenza,inj,Quad PF,6+ Mos 03/28/2016   Influenza-Unspecified 05/26/2013, 03/02/2014, 02/28/2015, 03/28/2016   PFIZER Comirnaty(Gray Top)Covid-19 Tri-Sucrose Vaccine 08/27/2019, 09/17/2019, 07/19/2020   PFIZER(Purple Top)SARS-COV-2 Vaccination 08/27/2019, 09/17/2019   PNEUMOCOCCAL CONJUGATE-20 09/07/2021   Pneumococcal Conjugate-13 09/22/2015   Pneumococcal Polysaccharide-23 09/27/2016   Tdap 05/17/2008, 12/03/2010   Tetanus 05/04/2020    TDAP status: Up to date  Flu Vaccine status: Declined, Education has been provided regarding the importance of this vaccine but patient still declined. Advised may receive this vaccine at local pharmacy or Health Dept. Aware to provide a copy of the vaccination record if obtained from local pharmacy or Health Dept. Verbalized acceptance and understanding.  Pneumococcal vaccine status: Up to date  Covid-19 vaccine status: Declined, Education has been provided regarding the importance of this vaccine but patient still declined. Advised may receive this vaccine at local pharmacy or Health Dept.or vaccine clinic. Aware to provide a copy of the vaccination record if obtained from local pharmacy or Health Dept. Verbalized acceptance and understanding.  Qualifies for Shingles Vaccine? No    Screening Tests Health Maintenance  Topic Date Due   OPHTHALMOLOGY EXAM  06/28/2023   HEMOGLOBIN A1C  11/13/2023    FOOT EXAM  01/30/2024   Diabetic kidney evaluation - eGFR measurement  05/14/2024   Diabetic kidney evaluation - Urine ACR  05/14/2024   MAMMOGRAM  07/01/2024   Medicare Annual Wellness (AWV)  07/16/2024   DTaP/Tdap/Td (4 - Td or Tdap) 05/04/2030   Colonoscopy  01/25/2031   Pneumococcal Vaccine 5-83 Years old  Completed   Hepatitis C Screening  Completed   HIV Screening  Completed   HPV VACCINES  Aged Out   INFLUENZA VACCINE  Discontinued   COVID-19 Vaccine  Discontinued   Zoster Vaccines- Shingrix  Discontinued    Health Maintenance  Health Maintenance Due  Topic Date Due   OPHTHALMOLOGY EXAM  06/28/2023    Colorectal cancer screening: Type of screening: Colonoscopy. Completed 01/24/21. Repeat every 10 years  Mammogram status: Completed 07/02/23. Repeat every year    Additional Screening:  Hepatitis C Screening: Completed 02/24/21  Vision Screening: Recommended annual ophthalmology exams for early detection of glaucoma and other disorders of the eye. Is the patient up to date with their annual eye exam?  Yes  Who is the provider or what is the name of the office in which the patient attends annual eye exams? Dr Elmer Picker  If pt is not established with a provider, would they like to be referred to a provider to establish care? No .   Dental Screening:  Recommended annual dental exams for proper oral hygiene  Diabetic Foot Exam: Diabetic Foot Exam: Completed 11/06/22  Community Resource Referral / Chronic Care Management: CRR required this visit?  No   CCM required this visit?  No     Plan:     I have personally reviewed and noted the following in the patient's chart:   Medical and social history Use of alcohol, tobacco or illicit drugs  Current medications and supplements including opioid prescriptions. Patient is not currently taking opioid prescriptions. Functional ability and status Nutritional status Physical activity Advanced directives List of other  physicians Hospitalizations, surgeries, and ER visits in previous 12 months Vitals Screenings to include cognitive, depression, and falls Referrals and appointments  In addition, I have reviewed and discussed with patient certain preventive protocols, quality metrics, and best practice recommendations. A written personalized care plan for preventive services as well as general preventive health recommendations were provided to patient.     Marzella Schlein, LPN   1/61/0960   After Visit Summary: (MyChart) Due to this being a telephonic visit, the after visit summary with patients personalized plan was offered to patient via MyChart   Nurse Notes: none

## 2023-07-17 NOTE — Patient Instructions (Signed)
Cassandra [name] , Thank you for taking time to come for your Medicare Wellness Visit. I appreciate your ongoing commitment to your health goals. Please review the following plan we discussed and let me know if I can assist you in the future.   Referrals/Orders/Follow-Ups/Clinician Recommendations: Aim for 30 minutes of exercise or brisk walking, 6-8 glasses of water, and 5 servings of fruits and vegetables each day. Continue to lose weight   This is a list of the screening recommended for you and due dates:  Health Maintenance  Topic Date Due   Eye exam for diabetics  06/28/2023   Hemoglobin A1C  11/13/2023   Complete foot exam   01/30/2024   Yearly kidney function blood test for diabetes  05/14/2024   Yearly kidney health urinalysis for diabetes  05/14/2024   Mammogram  07/01/2024   Medicare Annual Wellness Visit  07/16/2024   DTaP/Tdap/Td vaccine (4 - Td or Tdap) 05/04/2030   Colon Cancer Screening  01/25/2031   Pneumococcal Vaccination  Completed   Hepatitis C Screening  Completed   HIV Screening  Completed   HPV Vaccine  Aged Out   Flu Shot  Discontinued   COVID-19 Vaccine  Discontinued   Zoster (Shingles) Vaccine  Discontinued    Advanced directives: (Copy Requested) Please bring a copy of your health care power of attorney and living will to the office to be added to your chart at your convenience.  Next Medicare Annual Wellness Visit scheduled for next year: Yes

## 2023-07-18 DIAGNOSIS — D849 Immunodeficiency, unspecified: Secondary | ICD-10-CM | POA: Diagnosis not present

## 2023-07-18 DIAGNOSIS — Z944 Liver transplant status: Secondary | ICD-10-CM | POA: Diagnosis not present

## 2023-08-02 ENCOUNTER — Other Ambulatory Visit: Payer: Self-pay | Admitting: Internal Medicine

## 2023-08-02 ENCOUNTER — Telehealth: Payer: Medicare Other | Admitting: Nurse Practitioner

## 2023-08-02 ENCOUNTER — Ambulatory Visit: Payer: Self-pay | Admitting: Family Medicine

## 2023-08-02 DIAGNOSIS — H01001 Unspecified blepharitis right upper eyelid: Secondary | ICD-10-CM | POA: Diagnosis not present

## 2023-08-02 DIAGNOSIS — Z944 Liver transplant status: Secondary | ICD-10-CM

## 2023-08-02 DIAGNOSIS — H01004 Unspecified blepharitis left upper eyelid: Secondary | ICD-10-CM | POA: Diagnosis not present

## 2023-08-02 DIAGNOSIS — R7401 Elevation of levels of liver transaminase levels: Secondary | ICD-10-CM

## 2023-08-02 DIAGNOSIS — R748 Abnormal levels of other serum enzymes: Secondary | ICD-10-CM

## 2023-08-02 MED ORDER — BACITRACIN-POLYMYXIN B 500-10000 UNIT/GM OP OINT: 1.0000 | TOPICAL_OINTMENT | Freq: Two times a day (BID) | OPHTHALMIC | 0 refills | Status: DC

## 2023-08-02 NOTE — Assessment & Plan Note (Signed)
 Acute Encouraged continued use of gentle cleansing.  Will also prescribe bacitracin polymyxin ointment that she can apply twice a day for a week to both eyes.  She does have allergy/intolerance to Ortho myosin, doxycycline, and Vioxx -consulted with pharmacy and no cross sensitivity expected. Patient has follow-up with PCP in 4 days and she was encouraged to keep this appointment to determine how she is tolerating medication and her response to the medication.  For now we will hold off on prescribing oral antibiotics as symptoms appear mild to moderate currently and if we can treat with topical option I think this would be safer in regards to her liver function at this time.  If she experiences treatment failure with topical antibiotic alone may consider oral antibiotic recommendation at that time.

## 2023-08-02 NOTE — Progress Notes (Signed)
 Established Patient Office Visit  An audio/visual tele-health visit was completed today for this patient. I connected with  Cassandra [name] on 08/02/23 utilizing audio/visual technology and verified that I am speaking with the correct person using two identifiers. The patient was located at their home, and I was located at the office of [facility] at [facility] during the encounter. I discussed the limitations of evaluation and management by telemedicine. The patient expressed understanding and agreed to proceed.    Subjective   Patient ID: Cassandra [name], female    DOB: Feb 28, 1961  Age: 63 y.o. MRN: [id number]  Chief Complaint  Patient presents with   Eye Problem    Patient noted bilateral discoloration of eyes and itchiness for the past 1-2 weeks (really bad this past Monday). Also having issues opening eyes in the morning, notes of "grit" "crust" in the eyes. Medication issue/possible allergy. Currently treating with cold water and compresses.    Patient has today for the above.  Over the last 1 to 2 weeks patient has been experiencing itching of bilateral eyes as well as discharge and redness of the eyes.  She reports some blurring of her vision but when she blinks her eyes blurring clears she also denies pain of the eye.  She reports she has a history of blepharitis and has been treated with both topical and oral antibiotics in the past.  She also reports baseline of dry eye for which she takes eyedrops.  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  She follows with [facility] regarding her liver transplant and reports having appointment coming up with them in the near future.    ROS: see HPI    Objective:     There were no vitals taken for this visit.   Physical Exam Comprehensive physical exam not completed today as office visit was conducted remotely.  No acute  distress identified on video.  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  Patient was alert and oriented, and appeared to have appropriate judgment.   No results found for any visits on 08/02/23.    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  Will also prescribe bacitracin polymyxin ointment that she can apply twice a day for a week to both eyes.  She does have allergy/intolerance to Ortho myosin, doxycycline, and Vioxx -consulted with pharmacy and no cross sensitivity expected. Patient has follow-up with PCP in 4 days and she was encouraged to keep this appointment to determine how she is tolerating medication and her response to the medication.  For now we will hold off on prescribing oral antibiotics as symptoms appear mild to moderate currently and if we can treat with topical option I think this would be safer in regards to her liver function at this time.  If she experiences treatment failure with topical antibiotic alone may consider oral antibiotic recommendation at that time.  Relevant Medications   bacitracin-polymyxin b (POLYSPORIN) ophthalmic ointment    Return in about 1 week (around 08/09/2023) for with PCP as scheduled.    Elenore Paddy, NP

## 2023-08-02 NOTE — Telephone Encounter (Signed)
 Please see pt triage note and advise; pt appt scheduled for Tuesday

## 2023-08-02 NOTE — Telephone Encounter (Signed)
  Chief Complaint: Eye swelling/eye itching Symptoms: swelling to eyelids and under eyes, itching to eyes-patient states similar symptoms to six months ago when she was diagnosed with allergies Frequency: 1 week Pertinent Negatives: Patient denies CP, SOB, no blurry vision Disposition: [] ED /[] Urgent Care (no appt availability in office) / [x] Appointment(In office/virtual)/ []  [place] [facility]/ [] Home Care/ [] Refused Recommended Disposition /[] [facility]/ []  Follow-up with PCP Additional Notes: patient called with concerns for swelling to her eyelids and under her eyes along with itching to her eyes. Patient requesting for medication to be sent in or for a virtual visit. Per protocol, patient recommended to be seen in 24 hours. Video Visit appointment made with alternative location with availability. Patient verbalized understanding and all questions answered.     Copied from CRM [id number]. Topic: Clinical - Red Word Triage >> Aug 02, 2023 12:26 PM Prudencio Pair wrote: Red Word that prompted transfer to Nurse Triage: Patient states she is having an allergic reaction again with her eyes. Trying to see if a prescription can be sent in or maybe be seen virtually. Already has an upcoming appt on Tuesday with Dr. Mardelle Matte. Reason for Disposition  [1] SEVERE eyelid swelling (i.e., shut or almost) AND [2] involves both eyes AND [3] itchy  Answer Assessment - Initial Assessment Questions 1. ONSET: "When did the swelling start?" (e.g., minutes, hours, days)     Started this past Monday 2. LOCATION: "What part of the eyelids is swollen?"     Both eyelids 3. SEVERITY: "How swollen is it?"     10 4. ITCHING: "Is there any itching?" If Yes, ask: "How much?"   (Scale 1-10; mild, moderate or severe)     Yes-severe at nighttime 5. PAIN: "Is the swelling painful to touch?" If Yes, ask: "How painful is it?"   (Scale 1-10; mild, moderate or severe)     Pain is 7 out of 10 6. FEVER: "Do you have a  fever?" If Yes, ask: "What is it, how was it measured, and when did it start?"      No 7. CAUSE: "What do you think is causing the swelling?"     Possibly allergies-patient states this happened several months ago and had similar symptoms 8. RECURRENT SYMPTOM: "Have you had eyelid swelling before?" If Yes, ask: "When was the last time?" "What happened that time?"     Yes-last June 9. OTHER SYMPTOMS: "Do you have any other symptoms?" (e.g., blurred vision, eye discharge, rash, runny nose)     Patient states she wakes up with stuff in her eye  Protocols used: Eye - Swelling-A-AH

## 2023-08-06 ENCOUNTER — Ambulatory Visit (INDEPENDENT_AMBULATORY_CARE_PROVIDER_SITE_OTHER): Payer: Medicare Other | Admitting: Family Medicine

## 2023-08-06 ENCOUNTER — Encounter: Payer: Self-pay | Admitting: Family Medicine

## 2023-08-06 VITALS — BP 156/94 | HR 84 | Temp 97.7°F | Ht 65.0 in | Wt 188.8 lb

## 2023-08-06 DIAGNOSIS — H1013 Acute atopic conjunctivitis, bilateral: Secondary | ICD-10-CM

## 2023-08-06 DIAGNOSIS — H01001 Unspecified blepharitis right upper eyelid: Secondary | ICD-10-CM

## 2023-08-06 DIAGNOSIS — H01004 Unspecified blepharitis left upper eyelid: Secondary | ICD-10-CM | POA: Diagnosis not present

## 2023-08-06 DIAGNOSIS — R030 Elevated blood-pressure reading, without diagnosis of hypertension: Secondary | ICD-10-CM

## 2023-08-06 NOTE — Progress Notes (Signed)
 Subjective  CC:  Chief Complaint  Patient presents with   Eye Problem    Eye swelling/eye itching Symptoms: swelling to eyelids and under eyes, itching to eyes-patient states similar symptoms to six months ago when she was diagnosed with allergies     HPI: Cassandra [name] is a 63 y.o. female who presents to the office today to address the problems listed above in the chief complaint. I reviewed recent telehealth visit.  Treated for blepharitis.  Patient reports typical symptoms of crusting of the eyelashes, redness of the eye associated with itching and drainage.  No photophobia.  No fevers or chills.  No sinus symptoms.  She does have chronic allergic rhinitis.  Seasonal symptoms are present.  Otherwise feeling well.  No systemic complaints. Elevated blood pressure: No history of hypertension.  No chest pain shortness of breath. BP Readings from Last 3 Encounters:  08/06/23 (!) 156/94  05/15/23 138/88  04/30/23 107/76    Assessment  1. Allergic conjunctivitis of both eyes   2. 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 Elevated blood pressure reading without diagnosis of hypertension      Plan  Blepharitis and conjunctivitis: Education given.  Continue antibiotic ointment for the eyelashes, gentle cleansing and Patanol for the allergic conjunctivitis.  Continue Xyzal for allergies.  She does have Flonase as well and I recommend starting neck that she has allergic rhinitis.  Reassured, no symptoms of sinus infection given.  No antibiotics warranted at this time. Elevated blood pressure: Will monitor.  Follow up: As needed Visit date not found  No orders of the defined types were placed in this encounter.  No orders of the defined types were placed in this encounter.     I reviewed the patients updated PMH, FH, and SocHx.    Patient Active Problem List   Diagnosis Date Noted   Lumbar spinal stenosis 05/10/2022    Priority: High   Diet-controlled  type 2 diabetes mellitus (HCC) 08/28/2021    Priority: High   Chronic kidney disease (CKD) stage G3a/A1, moderately decreased glomerular filtration rate (GFR) between 45-59 mL/min/1.73 square meter and albuminuria creatinine ratio less than 30 mg/g (HCC) 03/01/2021    Priority: High   Statin myopathy 05/13/2018    Priority: High   Primary insomnia 11/04/2017    Priority: High   Immunosuppressed status (HCC) 10/07/2017    Priority: High   Adenomatous polyp of colon 09/28/2017    Priority: High   Combined hyperlipidemia associated with type 2 diabetes mellitus (HCC) 03/10/2013    Priority: High   Primary osteoarthritis of knees, bilateral 03/01/2013    Priority: High   Chronic back pain 10/13/2012    Priority: High   History of liver transplant (HCC) 02/22/2009    Priority: High   Migraine with aura and without status migrainosus, not intractable 07/03/2018    Priority: Medium    Cervical facet joint syndrome 03/28/2018    Priority: Medium    Spondylosis of cervical region without myelopathy or radiculopathy 03/28/2018    Priority: Medium    Lumbar facet arthropathy 12/27/2017    Priority: Medium    Cervical stenosis of spine 01/25/2017    Priority: Medium    Fibromyalgia 08/24/2013    Priority: Medium    Hx of tuberculosis 10/13/2012    Priority: Medium    Anxiety disorder 09/16/2007    Priority: Medium    AS (sickle cell trait) (HCC) 06/29/2019    Priority: Low  Blepharitis of upper eyelids of both eyes 08/02/2023   Screening for osteoporosis 05/15/2023   Current Meds  Medication Sig   albuterol (VENTOLIN HFA) 108 (90 Base) MCG/ACT inhaler Inhale 2 puffs into the lungs every 6 (six) hours as needed for wheezing or shortness of breath.   bacitracin-polymyxin b (POLYSPORIN) ophthalmic ointment Place 1 Application into both eyes 2 (two) times daily. apply to eye every 12 hours while awake   diclofenac Sodium (VOLTAREN) 1 % GEL Apply 4 g topically 4 (four) times daily as  needed.   fluticasone (FLONASE) 50 MCG/ACT nasal spray SHAKE LIQUID AND USE 2 SPRAYS IN EACH NOSTRIL DAILY   levocetirizine (XYZAL) 5 MG tablet Take 1 tablet (5 mg total) by mouth every evening.   magnesium oxide (MAG-OX) 400 MG tablet Take by mouth.   mycophenolate (CELLCEPT) 500 MG tablet Take 500 mg by mouth 2 (two) times daily.   olopatadine (PATANOL) 0.1 % ophthalmic solution Place 1 drop into both eyes 2 (two) times daily.   Omega-3 Fatty Acids (FISH OIL) 1000 MG CAPS Take 1 capsule (1,000 mg total) by mouth daily.   predniSONE (DELTASONE) 1 MG tablet Take 5 mg by mouth daily.   tacrolimus (PROGRAF) 1 MG capsule Take 2 capsules (2 mg total) by mouth 2 (two) times daily.    Allergies: Patient is allergic to erythromycin, ezetimibe, other, rofecoxib, statins, zonisamide, carvacrol, doxycycline, elavil [amitriptyline], metoprolol, and nsaids. Family History: Patient family history includes Alzheimer's disease in her mother; Cancer in her sister; Dementia in her mother; Diabetes in her brother and mother; Gout in her mother; Heart disease in her sister; Hypertension in her mother and sister; Stroke in her mother. Social History:  Patient  reports that she has never smoked. She has never used smokeless tobacco. She reports that she does not drink alcohol and does not use drugs.  Review of Systems: Constitutional: Negative for fever malaise or anorexia Cardiovascular: negative for chest pain Respiratory: negative for SOB or persistent cough Gastrointestinal: negative for abdominal pain  Objective  Vitals: BP (!) 156/94   Pulse 84   Temp 97.7 F (36.5 C)   Ht 5\' 5"  (1.651 m)   Wt 188 lb 12.8 oz (85.6 kg)   SpO2 100%   BMI 31.42 kg/m  General: no acute distress , A&Ox3 HEENT: PEERL, conjunctiva mildly injected bilaterally, right upper eyelid ptosis present, eyelashes currently clear but patient finds them this morning.  Neck is supple No significant nasal congestion, no sinus  pain  Commons side effects, risks, benefits, and alternatives for medications and treatment plan prescribed today were discussed, and the patient expressed understanding of the given instructions. Patient is instructed to call or message via MyChart if he/she has any questions or concerns regarding our treatment plan. No barriers to understanding were identified. We discussed Red Flag symptoms and signs in detail. Patient expressed understanding regarding what to do in case of urgent or emergency type symptoms.  Medication list was reconciled, printed and provided to the patient in AVS. Patient instructions and summary information was reviewed with the patient as documented in the AVS. This note was prepared with assistance of Dragon voice recognition software. Occasional wrong-word or sound-a-like substitutions may have occurred due to the inherent limitations of voice recognition software

## 2023-08-16 DIAGNOSIS — D849 Immunodeficiency, unspecified: Secondary | ICD-10-CM | POA: Diagnosis not present

## 2023-08-16 DIAGNOSIS — Z944 Liver transplant status: Secondary | ICD-10-CM | POA: Diagnosis not present

## 2023-08-20 ENCOUNTER — Ambulatory Visit
Admission: RE | Admit: 2023-08-20 | Discharge: 2023-08-20 | Disposition: A | Discharge status: Home / Self Care | Source: Ambulatory Visit | Attending: Internal Medicine | Admitting: Internal Medicine

## 2023-08-20 DIAGNOSIS — R7401 Elevation of levels of liver transaminase levels: Secondary | ICD-10-CM

## 2023-08-20 DIAGNOSIS — R748 Abnormal levels of other serum enzymes: Secondary | ICD-10-CM

## 2023-08-20 DIAGNOSIS — Z944 Liver transplant status: Secondary | ICD-10-CM

## 2023-08-20 DIAGNOSIS — R945 Abnormal results of liver function studies: Secondary | ICD-10-CM | POA: Diagnosis not present

## 2023-08-20 MED ORDER — GADOPICLENOL 0.5 MMOL/ML IV SOLN
9.0000 mL | Freq: Once | INTRAVENOUS | Status: AC | PRN
  Administered 2023-08-20: 9 mL via INTRAVENOUS

## 2023-08-29 DIAGNOSIS — K838 Other specified diseases of biliary tract: Secondary | ICD-10-CM | POA: Diagnosis not present

## 2023-09-09 ENCOUNTER — Ambulatory Visit: Admitting: Family Medicine

## 2023-09-09 ENCOUNTER — Encounter: Payer: Self-pay | Admitting: Family Medicine

## 2023-09-09 VITALS — BP 147/76 | HR 90 | Temp 97.7°F | Ht 65.0 in | Wt 187.0 lb

## 2023-09-09 DIAGNOSIS — E119 Type 2 diabetes mellitus without complications: Secondary | ICD-10-CM

## 2023-09-09 DIAGNOSIS — M25461 Effusion, right knee: Secondary | ICD-10-CM | POA: Diagnosis not present

## 2023-09-09 DIAGNOSIS — M1711 Unilateral primary osteoarthritis, right knee: Secondary | ICD-10-CM

## 2023-09-09 MED ORDER — TRIAMCINOLONE ACETONIDE 40 MG/ML IJ SUSP
40.0000 mg | Freq: Once | INTRAMUSCULAR | Status: AC
  Administered 2023-09-09: 40 mg via INTRA_ARTICULAR

## 2023-09-09 MED ORDER — TRIAMCINOLONE ACETONIDE 40 MG/ML IJ SUSP: 40.0000 mg | Freq: Once | INTRAMUSCULAR | Status: DC

## 2023-09-09 NOTE — Progress Notes (Signed)
 Subjective  CC:  Chief Complaint  Patient presents with   Joint Swelling    HPI: Cassandra [name] is a 63 y.o. female who presents to the office today to address the problems listed above in the chief complaint.  She has OA of the  Right knee(s) and is having pain that impedes her quality of life and activity level. See past notes for further documentation. She was working on her home for an inspection last week: lots of walking on hard wood floors and bending/lifting w/ resultant increase in right knee pain and swelling. Took tumeric and iced knee over weekend with some resolution of sxs.  No contraindications to steroids or Synvisc are identified. We have used alternative measures for pain control including tylenol, nsaids, ice, rest, and other prescribed analgesics with variable success. She has well controlled type 2 diabetes. Last aspiration and injection was done in 03/2023.  I reviewed the patients updated PMH, FH, and SocHx.    Patient Active Problem List   Diagnosis Date Noted   Lumbar spinal stenosis 05/10/2022    Priority: High   Diet-controlled type 2 diabetes mellitus (HCC) 08/28/2021    Priority: High   Chronic kidney disease (CKD) stage G3a/A1, moderately decreased glomerular filtration rate (GFR) between 45-59 mL/min/1.73 square meter and albuminuria creatinine ratio less than 30 mg/g (HCC) 03/01/2021    Priority: High   Statin myopathy 05/13/2018    Priority: High   Primary insomnia 11/04/2017    Priority: High   Immunosuppressed status (HCC) 10/07/2017    Priority: High   Adenomatous polyp of colon 09/28/2017    Priority: High   Combined hyperlipidemia associated with type 2 diabetes mellitus (HCC) 03/10/2013    Priority: High   Primary osteoarthritis of knees, bilateral 03/01/2013    Priority: High   Chronic back pain 10/13/2012    Priority: High   History of liver transplant (HCC) 02/22/2009    Priority: High   Migraine with aura and without status  migrainosus, not intractable 07/03/2018    Priority: Medium    Cervical facet joint syndrome 03/28/2018    Priority: Medium    Spondylosis of cervical region without myelopathy or radiculopathy 03/28/2018    Priority: Medium    Lumbar facet arthropathy 12/27/2017    Priority: Medium    Cervical stenosis of spine 01/25/2017    Priority: Medium    Fibromyalgia 08/24/2013    Priority: Medium    Hx of tuberculosis 10/13/2012    Priority: Medium    Anxiety disorder 09/16/2007    Priority: Medium    AS (sickle cell trait) (HCC) 06/29/2019    Priority: Low   Blepharitis of upper eyelids of both eyes 08/02/2023   Screening for osteoporosis 05/15/2023   Current Meds  Medication Sig   albuterol (VENTOLIN HFA) 108 (90 Base) MCG/ACT inhaler Inhale 2 puffs into the lungs every 6 (six) hours as needed for wheezing or shortness of breath.   bacitracin-polymyxin b (POLYSPORIN) ophthalmic ointment Place 1 Application into both eyes 2 (two) times daily. apply to eye every 12 hours while awake   diclofenac Sodium (VOLTAREN) 1 % GEL Apply 4 g topically 4 (four) times daily as needed.   fluticasone (FLONASE) 50 MCG/ACT nasal spray SHAKE LIQUID AND USE 2 SPRAYS IN EACH NOSTRIL DAILY   levocetirizine (XYZAL) 5 MG tablet Take 1 tablet (5 mg total) by mouth every evening.   magnesium oxide (MAG-OX) 400 MG tablet Take by mouth.   mycophenolate (CELLCEPT) 500 MG  tablet Take 500 mg by mouth 2 (two) times daily.   olopatadine (PATANOL) 0.1 % ophthalmic solution Place 1 drop into both eyes 2 (two) times daily.   Omega-3 Fatty Acids (FISH OIL) 1000 MG CAPS Take 1 capsule (1,000 mg total) by mouth daily.   predniSONE (DELTASONE) 1 MG tablet Take 5 mg by mouth daily.   tacrolimus (PROGRAF) 1 MG capsule Take 2 capsules (2 mg total) by mouth 2 (two) times daily.    Allergies: Patient is allergic to erythromycin, ezetimibe, other, rofecoxib, statins, zonisamide, carvacrol, doxycycline, elavil [amitriptyline],  metoprolol, and nsaids.  Review of Systems: Constitutional: Negative for fever malaise or anorexia Cardiovascular: negative for chest pain Respiratory: negative for SOB or persistent cough Gastrointestinal: negative for abdominal pain  Objective  Vitals: BP (!) 147/76   Pulse 90   Temp 97.7 F (36.5 C)   Ht 5\' 5"  (1.651 m)   Wt 187 lb (84.8 kg)   SpO2 100%   BMI 31.12 kg/m  General: no acute distress , A&Ox3 Cardiovascular:  RRR without murmur  Knee:  Right effusion, warm, no erythema. Mild lateral and medial ttp. No calf ttp Skin:  Warm, no rashes  Procedure note for Knee Joint Aspiration and/or Injection:  Indications: pain relief, failed conservative therapies  Knee Arthrocentesis with Aspiration and Injection Procedure Note  Pre-operative Diagnosis: right knee OA w/ effusion  Post-operative Diagnosis: same  Indications: Symptomatic relief of large effusion  Anesthesia: Lidocaine 1% without epinephrine without added sodium bicarbonate  Procedure Details   Verbal consent was obtained for the procedure. Universal time out taken.  The Knee joint was prepped with alcohol and an 18 gauge needle was inserted into the joint from the lateral approach. 32ml of clear yellow fluid was removed from the joint and discarded. Four ml 1% lidocaine and one ml of triamcinolone (KENALOG) 40mg /ml was then injected into the joint through the same needle. The needle was removed and the area cleansed and dressed.  Complications:  None; patient tolerated the procedure well.   Assessment  1. Primary osteoarthritis of right knee   2. Effusion of right knee joint   3. Diet-controlled type 2 diabetes mellitus (HCC)      Plan  S/p intra-articular knee joint aspiration and steroid injection:  Routine post procedure care discussed. Rest, Ice, Nsaids as needed and ROM exercises recommended. F/u care printed out for patient in AVS.  Discussed urgent f/u if develops increased pain, redness or  fever.   Follow up: 3 mo for DM recheck   Commons side effects, risks, benefits, and alternatives for medications and treatment plan prescribed today were discussed, and the patient expressed understanding of the given instructions. Patient is instructed to call or message via MyChart if he/she has any questions or concerns regarding our treatment plan. No barriers to understanding were identified. We discussed Red Flag symptoms and signs in detail. Patient expressed understanding regarding what to do in case of urgent or emergency type symptoms.  Medication list was reconciled, printed and provided to the patient in AVS. Patient instructions and summary information was reviewed with the patient as documented in the AVS. This note was prepared with assistance of Dragon voice recognition software. Occasional wrong-word or sound-a-like substitutions may have occurred due to the inherent limitations of voice recognition software  No orders of the defined types were placed in this encounter.  Meds ordered this encounter  Medications   triamcinolone acetonide (KENALOG-40) injection 40 mg

## 2023-09-09 NOTE — Patient Instructions (Signed)
 Please return in June for diabetes recheck  If you have any questions or concerns, please don't hesitate to send me a message via MyChart or call the office at [phone]. Thank you for visiting with [place] today! It's our pleasure caring for you.

## 2023-09-11 DIAGNOSIS — D849 Immunodeficiency, unspecified: Secondary | ICD-10-CM | POA: Diagnosis not present

## 2023-09-11 DIAGNOSIS — E785 Hyperlipidemia, unspecified: Secondary | ICD-10-CM | POA: Diagnosis not present

## 2023-09-11 DIAGNOSIS — M791 Myalgia, unspecified site: Secondary | ICD-10-CM | POA: Diagnosis not present

## 2023-09-11 DIAGNOSIS — Z79899 Other long term (current) drug therapy: Secondary | ICD-10-CM | POA: Diagnosis not present

## 2023-09-11 DIAGNOSIS — T86818 Other complications of lung transplant: Secondary | ICD-10-CM | POA: Diagnosis not present

## 2023-09-11 DIAGNOSIS — R7989 Other specified abnormal findings of blood chemistry: Secondary | ICD-10-CM | POA: Diagnosis not present

## 2023-09-11 DIAGNOSIS — Z944 Liver transplant status: Secondary | ICD-10-CM | POA: Diagnosis not present

## 2023-09-11 DIAGNOSIS — Z5181 Encounter for therapeutic drug level monitoring: Secondary | ICD-10-CM | POA: Diagnosis not present

## 2023-09-11 DIAGNOSIS — Z4823 Encounter for aftercare following liver transplant: Secondary | ICD-10-CM | POA: Diagnosis not present

## 2023-09-11 DIAGNOSIS — K759 Inflammatory liver disease, unspecified: Secondary | ICD-10-CM | POA: Diagnosis not present

## 2023-09-11 DIAGNOSIS — K7400 Hepatic fibrosis, unspecified: Secondary | ICD-10-CM | POA: Diagnosis not present

## 2023-10-03 DIAGNOSIS — Z944 Liver transplant status: Secondary | ICD-10-CM | POA: Diagnosis not present

## 2023-10-03 DIAGNOSIS — D849 Immunodeficiency, unspecified: Secondary | ICD-10-CM | POA: Diagnosis not present

## 2023-10-17 ENCOUNTER — Emergency Department (HOSPITAL_BASED_OUTPATIENT_CLINIC_OR_DEPARTMENT_OTHER)
Admission: EM | Admit: 2023-10-17 | Discharge: 2023-10-17 | Disposition: A | Discharge status: Home / Self Care | Attending: Emergency Medicine | Admitting: Emergency Medicine

## 2023-10-17 ENCOUNTER — Emergency Department (HOSPITAL_BASED_OUTPATIENT_CLINIC_OR_DEPARTMENT_OTHER)

## 2023-10-17 ENCOUNTER — Encounter (HOSPITAL_BASED_OUTPATIENT_CLINIC_OR_DEPARTMENT_OTHER): Payer: Self-pay

## 2023-10-17 ENCOUNTER — Other Ambulatory Visit: Payer: Self-pay

## 2023-10-17 DIAGNOSIS — K838 Other specified diseases of biliary tract: Secondary | ICD-10-CM | POA: Diagnosis not present

## 2023-10-17 DIAGNOSIS — Z944 Liver transplant status: Secondary | ICD-10-CM | POA: Diagnosis not present

## 2023-10-17 DIAGNOSIS — R1011 Right upper quadrant pain: Secondary | ICD-10-CM | POA: Diagnosis not present

## 2023-10-17 DIAGNOSIS — N281 Cyst of kidney, acquired: Secondary | ICD-10-CM | POA: Diagnosis not present

## 2023-10-17 DIAGNOSIS — R112 Nausea with vomiting, unspecified: Secondary | ICD-10-CM | POA: Insufficient documentation

## 2023-10-17 DIAGNOSIS — R109 Unspecified abdominal pain: Secondary | ICD-10-CM | POA: Diagnosis not present

## 2023-10-17 LAB — RESP PANEL BY RT-PCR (RSV, FLU A&B, COVID)  RVPGX2
Influenza A by PCR: NEGATIVE
Influenza B by PCR: NEGATIVE
Resp Syncytial Virus by PCR: NEGATIVE
SARS Coronavirus 2 by RT PCR: NEGATIVE

## 2023-10-17 LAB — URINALYSIS, ROUTINE W REFLEX MICROSCOPIC
Bilirubin Urine: NEGATIVE
Glucose, UA: NEGATIVE mg/dL
Ketones, ur: NEGATIVE mg/dL
Nitrite: NEGATIVE
Protein, ur: 30 mg/dL — AB
Specific Gravity, Urine: 1.025 (ref 1.005–1.030)
pH: 5.5 (ref 5.0–8.0)

## 2023-10-17 LAB — COMPREHENSIVE METABOLIC PANEL WITH GFR
ALT: 65 U/L — ABNORMAL HIGH (ref 0–44)
AST: 60 U/L — ABNORMAL HIGH (ref 15–41)
Albumin: 4.3 g/dL (ref 3.5–5.0)
Alkaline Phosphatase: 167 U/L — ABNORMAL HIGH (ref 38–126)
Anion gap: 17 — ABNORMAL HIGH (ref 5–15)
BUN: 20 mg/dL (ref 8–23)
CO2: 19 mmol/L — ABNORMAL LOW (ref 22–32)
Calcium: 9.6 mg/dL (ref 8.9–10.3)
Chloride: 101 mmol/L (ref 98–111)
Creatinine, Ser: 1.22 mg/dL — ABNORMAL HIGH (ref 0.44–1.00)
GFR, Estimated: 50 mL/min — ABNORMAL LOW (ref >60)
Glucose, Bld: 170 mg/dL — ABNORMAL HIGH (ref 70–99)
Potassium: 3.6 mmol/L (ref 3.5–5.1)
Sodium: 137 mmol/L (ref 135–145)
Total Bilirubin: 0.4 mg/dL (ref 0.0–1.2)
Total Protein: 7.5 g/dL (ref 6.5–8.1)

## 2023-10-17 LAB — CBC
HCT: 43 % (ref 36.0–46.0)
Hemoglobin: 14.7 g/dL (ref 12.0–15.0)
MCH: 28.3 pg (ref 26.0–34.0)
MCHC: 34.2 g/dL (ref 30.0–36.0)
MCV: 82.7 fL (ref 80.0–100.0)
Platelets: 202 10*3/uL (ref 150–400)
RBC: 5.2 MIL/uL — ABNORMAL HIGH (ref 3.87–5.11)
RDW: 13.6 % (ref 11.5–15.5)
WBC: 9 10*3/uL (ref 4.0–10.5)
nRBC: 0 % (ref 0.0–0.2)

## 2023-10-17 LAB — URINALYSIS, MICROSCOPIC (REFLEX): WBC, UA: >50 WBC/hpf (ref 0–5)

## 2023-10-17 LAB — LIPASE, BLOOD: Lipase: 72 U/L — ABNORMAL HIGH (ref 11–51)

## 2023-10-17 MED ORDER — PROMETHAZINE HCL 25 MG PO TABS: 25.0000 mg | ORAL_TABLET | Freq: Four times a day (QID) | ORAL | 0 refills | Status: DC | PRN

## 2023-10-17 MED ORDER — SODIUM CHLORIDE 0.9 % IV SOLN
1.0000 g | Freq: Once | INTRAVENOUS | Status: AC
  Administered 2023-10-17: 1 g via INTRAVENOUS
  Filled 2023-10-17: qty 10

## 2023-10-17 MED ORDER — ONDANSETRON 4 MG PO TBDP
ORAL_TABLET | ORAL | Status: AC
  Filled 2023-10-17: qty 1

## 2023-10-17 MED ORDER — CEFPODOXIME PROXETIL 200 MG PO TABS
200.0000 mg | ORAL_TABLET | Freq: Two times a day (BID) | ORAL | 0 refills | Status: DC
Start: 2023-10-17 — End: 2023-10-26

## 2023-10-17 MED ORDER — ONDANSETRON HCL 4 MG/2ML IJ SOLN
4.0000 mg | Freq: Once | INTRAMUSCULAR | Status: AC
  Administered 2023-10-17: 4 mg via INTRAVENOUS
  Filled 2023-10-17: qty 2

## 2023-10-17 MED ORDER — IOHEXOL 300 MG/ML  SOLN
100.0000 mL | Freq: Once | INTRAMUSCULAR | Status: AC | PRN
  Administered 2023-10-17: 100 mL via INTRAVENOUS

## 2023-10-17 MED ORDER — SODIUM CHLORIDE 0.9 % IV SOLN
25.0000 mg | Freq: Four times a day (QID) | INTRAVENOUS | Status: DC | PRN
  Filled 2023-10-17: qty 1

## 2023-10-17 MED ORDER — HYDROMORPHONE HCL 1 MG/ML IJ SOLN
1.0000 mg | Freq: Once | INTRAMUSCULAR | Status: AC
  Administered 2023-10-17: 1 mg via INTRAVENOUS
  Filled 2023-10-17: qty 1

## 2023-10-17 MED ORDER — FENTANYL CITRATE PF 50 MCG/ML IJ SOSY
50.0000 ug | PREFILLED_SYRINGE | Freq: Once | INTRAMUSCULAR | Status: AC
  Administered 2023-10-17: 50 ug via INTRAVENOUS
  Filled 2023-10-17: qty 1

## 2023-10-17 MED ORDER — PROMETHAZINE HCL 25 MG/ML IJ SOLN
INTRAMUSCULAR | Status: AC
  Administered 2023-10-17: 25 mg
  Filled 2023-10-17: qty 1

## 2023-10-17 MED ORDER — ONDANSETRON HCL 4 MG/2ML IJ SOLN
4.0000 mg | Freq: Once | INTRAMUSCULAR | Status: AC | PRN
  Administered 2023-10-17: 4 mg via INTRAVENOUS
  Filled 2023-10-17: qty 2

## 2023-10-17 NOTE — Discharge Instructions (Addendum)
 As we discussed, all your labs and imaging were normal which is great news.  I would like for you to follow-up with your transplant team within the next 1 to 2 weeks if you are able. Take ibuprofen and/or Tylenol  as needed for pain.  You can follow-up with the emergency department for any worsening symptoms.  I have sent a prescription to your pharmacy for nausea and vomiting. Please take as directed. I have also sent you an antibiotic to your pharmacy.  I would speak with your transplant team to make sure you are able to take this medication.

## 2023-10-17 NOTE — ED Provider Notes (Signed)
 [place] [facility]PARTMENT AT [facility] Provider Note   CSN: [id number] Arrival date & time: 10/17/23  1113     History Chief Complaint  Patient presents with   Loss of Consciousness    Cassandra [name] is a 63 y.o. 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  Patient states that she recently had her medication dosing increased for her antirejection medications back in mid April.  Since then she has been having some constant nausea pretty much every day.  Over the last several days, patient has been having increased level of nausea with multiple episodes of vomiting.  She also states that she did lose consciousness while vomiting. Also having diarrhea. She did not have any chest pain or shortness of breath at that time.  She is also complaining of a right lateral thigh pain as she fell on this when she did lose consciousness.  Currently, patient is complaining of abdominal pain primarily to the right upper quadrant which she describes as a burning sensation.  It is 10/10 in severity.  She denies any fever, chills, cough, congestion.  She denies any urinary symptoms.  She has not missed any doses of her antirejection medications.   Loss of Consciousness      Home Medications Prior to Admission medications   Medication Sig Start Date End Date Taking? Authorizing Provider  cefpodoxime (VANTIN) 200 MG tablet Take 1 tablet (200 mg total) by mouth 2 (two) times daily. 10/17/23  Yes Jamal Mays, Deanta Mincey M, PA-C  albuterol  (VENTOLIN  HFA) 108 (90 Base) MCG/ACT inhaler Inhale 2 puffs into the lungs every 6 (six) hours as needed for wheezing or shortness of breath. 04/30/23   Christel Cousins, MD  bacitracin -polymyxin b  (POLYSPORIN ) ophthalmic ointment Place 1  Application into both eyes 2 (two) times daily. apply to eye every 12 hours while awake 08/02/23   Zorita Hiss, NP  diclofenac  Sodium (VOLTAREN ) 1 % GEL Apply 4 g topically 4 (four) times daily as needed. 02/28/22   Luevenia Saha, MD  fluticasone  (FLONASE ) 50 MCG/ACT nasal spray SHAKE LIQUID AND USE 2 SPRAYS IN EACH NOSTRIL DAILY 05/11/20   Luevenia Saha, MD  levocetirizine (XYZAL ) 5 MG tablet Take 1 tablet (5 mg total) by mouth every evening. 02/10/21   Buena Carmine, NP  magnesium  oxide (MAG-OX) 400 MG tablet Take by mouth.    [provider]  mycophenolate (CELLCEPT) 500 MG tablet Take 500 mg by mouth 2 (two) times daily.    [provider]  olopatadine  (PATANOL) 0.1 % ophthalmic solution Place 1 drop into both eyes 2 (two) times daily. 10/12/22   Allwardt, Alyssa M, PA-C  Omega-3 Fatty Acids (FISH OIL ) 1000 MG CAPS Take 1 capsule (1,000 mg total) by mouth daily. 03/16/22   Luevenia Saha, MD  predniSONE  (DELTASONE ) 1 MG tablet Take 5 mg by mouth daily. 07/13/21   [provider]  promethazine  (PHENERGAN ) 25 MG tablet Take 1 tablet (25 mg total) by mouth every 6 (six) hours as needed for nausea or vomiting. 10/17/23  Yes Jamal Mays, Aaleyah Witherow M, PA-C  tacrolimus  (PROGRAF ) 1 MG capsule Take 2 capsules (2 mg total) by mouth 2 (two) times daily. 02/22/20   Luevenia Saha, MD      Allergies    Erythromycin, Ezetimibe, Other, Rofecoxib, Statins,  Zonisamide, Carvacrol, Doxycycline , Elavil  [amitriptyline ], Metoprolol , and Nsaids    Review of Systems   Review of Systems  Cardiovascular:  Positive for syncope.  All other systems reviewed and are negative.   Physical Exam Updated Vital Signs BP (!) 166/98   Pulse 83   Temp 98.1 F (36.7 C) (Oral)   Resp 12   Wt 85.3 kg   SpO2 96%   BMI 31.28 kg/m  Physical Exam Vitals and nursing note reviewed.  Constitutional:      General: She is not in acute distress.    Appearance: Normal appearance.  HENT:     Head:  Normocephalic and atraumatic.  Eyes:     General:        Right eye: No discharge.        Left eye: No discharge.  Cardiovascular:     Comments: Regular rate and rhythm.  S1/S2 are distinct without any evidence of murmur, rubs, or gallops.  Radial pulses are 2+ bilaterally.  Dorsalis pedis pulses are 2+ bilaterally.  No evidence of pedal edema. Pulmonary:     Comments: Clear to auscultation bilaterally.  Normal effort.  No respiratory distress.  No evidence of wheezes, rales, or rhonchi heard throughout. Abdominal:     General: Abdomen is flat. Bowel sounds are normal. There is no distension.     Tenderness: There is generalized abdominal tenderness. There is no guarding or rebound.     Comments: Well-healed surgical scar over the right upper quadrant.  Biopsy wound is also healed.  No signs of erythema or fluctuance.  Abdomen is diffusely tender to light palpation.  Abdomen is overall soft and nondistended.  Musculoskeletal:        General: Normal range of motion.     Cervical back: Neck supple.  Skin:    General: Skin is warm and dry.     Findings: No rash.  Neurological:     General: No focal deficit present.     Mental Status: She is alert.  Psychiatric:        Mood and Affect: Mood normal.        Behavior: Behavior normal.     ED Results / Procedures / Treatments   Labs (all labs ordered are listed, but only abnormal results are displayed) Labs Reviewed  COMPREHENSIVE METABOLIC PANEL WITH GFR - Abnormal; Notable for the following components:      Result Value   CO2 19 (*)    Glucose, Bld 170 (*)    Creatinine, Ser 1.22 (*)    AST 60 (*)    ALT 65 (*)    Alkaline Phosphatase 167 (*)    GFR, Estimated 50 (*)    Anion gap 17 (*)    All other components within normal limits  CBC - Abnormal; Notable for the following components:   RBC 5.20 (*)    All other components within normal limits  URINALYSIS, ROUTINE W REFLEX MICROSCOPIC - Abnormal; Notable for the following  components:   APPearance HAZY (*)    Hgb urine dipstick SMALL (*)    Protein, ur 30 (*)    Leukocytes,Ua MODERATE (*)    All other components within normal limits  LIPASE, BLOOD - Abnormal; Notable for the following components:   Lipase 72 (*)    All other components within normal limits  URINALYSIS, MICROSCOPIC (REFLEX) - Abnormal; Notable for the following components:   Bacteria, UA MANY (*)    All other components within normal limits  RESP PANEL  BY RT-PCR (RSV, FLU A&B, COVID)  RVPGX2  CBG MONITORING, ED    EKG EKG Interpretation Date/Time:  Thursday Oct 17 2023 12:14:06 EDT Ventricular Rate:  78 PR Interval:  159 QRS Duration:  87 QT Interval:  383 QTC Calculation: 437 R Axis:   161  Text Interpretation: Right and left arm electrode reversal, interpretation assumes no reversal Sinus rhythm Right axis deviation Abnormal T, consider ischemia, lateral leads since last tracing no significant change Confirmed by Hershel Los [phone]) on 10/17/2023 2:35:17 PM  Radiology CT ABDOMEN PELVIS W CONTRAST Result Date: 10/17/2023 CLINICAL DATA:  Abdominal pain. EXAM: CT ABDOMEN AND PELVIS WITH CONTRAST TECHNIQUE: Multidetector CT imaging of the abdomen and pelvis was performed using the standard protocol following bolus administration of intravenous contrast. RADIATION DOSE REDUCTION: This exam was performed according to the departmental dose-optimization program which includes automated exposure control, adjustment of the mA and/or kV according to patient size and/or use of iterative reconstruction technique. CONTRAST:  OMNIPAQUE  IOHEXOL  300 MG/ML  SOLN COMPARISON:  CT dated 07/27/2021. FINDINGS: Lower chest: The visualized lung bases are clear. No intra-abdominal free air or free fluid. Hepatobiliary: Postsurgical changes of liver transplant as seen on the prior CT. Indeterminate small area of decreased enhancement involving the caudate and left lobe of the liver of indeterminate  etiology and may represent areas of fatty infiltration or inflammation. Correlation with LFTs recommended. There is dilatation of the common bile duct similar to prior CT. Cholecystectomy. Pancreas: Unremarkable. No pancreatic ductal dilatation or surrounding inflammatory changes. Spleen: Normal in size without focal abnormality. Adrenals/Urinary Tract: The adrenal glands are unremarkable. Small left renal interpolar cyst. There is no hydronephrosis on either side. There is symmetric enhancement and excretion of contrast by both kidneys. The visualized ureters and urinary bladder appear unremarkable. Stomach/Bowel: There is no bowel obstruction or active inflammation. The appendix is normal. Vascular/Lymphatic: Advanced aortoiliac atherosclerotic disease. The IVC is unremarkable. No portal venous gas. There is no adenopathy. Reproductive: Hysterectomy.  No suspicious adnexal masses Other: None Musculoskeletal: Degenerative changes of the spine. No acute osseous pathology. IMPRESSION: 1. Status post prior liver transplant. Small areas of decreased enhancement involving the caudate and left lobe of the liver may represent fatty infiltration or inflammation. Correlation with LFTs recommended. 2. No bowel obstruction. Normal appendix. 3.  Aortic Atherosclerosis (ICD10-I70.0). Electronically Signed   By: Angus Bark M.D.   On: 10/17/2023 13:57    Procedures Procedures    Medications Ordered in ED Medications  promethazine  (PHENERGAN ) 25 mg in sodium chloride  0.9 % 50 mL IVPB (0 mg Intravenous Stopped 10/17/23 1459)  ondansetron  (ZOFRAN ) injection 4 mg (4 mg Intravenous Given 10/17/23 1215)  fentaNYL  (SUBLIMAZE ) injection 50 mcg (50 mcg Intravenous Given 10/17/23 1239)  ondansetron  (ZOFRAN ) injection 4 mg (4 mg Intravenous Given 10/17/23 1306)  iohexol  (OMNIPAQUE ) 300 MG/ML solution 100 mL (100 mLs Intravenous Contrast Given 10/17/23 1311)  HYDROmorphone (DILAUDID) injection 1 mg (1 mg Intravenous Given  10/17/23 1338)  cefTRIAXone (ROCEPHIN) 1 g in sodium chloride  0.9 % 100 mL IVPB (1 g Intravenous New Bag/Given 10/17/23 1422)  promethazine  (PHENERGAN ) 25 MG/ML injection (25 mg  Given 10/17/23 1346)    ED Course/ Medical Decision Making/ A&P Clinical Course as of 10/17/23 1526  Thu Oct 17, 2023  1335 Patient still having vomiting despite 8 mg of Zofran .  QTc within normal limits.  Will plan to give her 25 mg of Phenergan .  Urine also appears to be infected.  Will also give her ceftriaxone  and 1 mg of Dilaudid. [CF]  1510 On repeat evaluation, patient is feeling much better from abdominal pain perspective and from a nausea and vomiting perspective.  We went over all labs and imaging with her at the bedside.  She does feel comfortable going home. [CF]  1510 Urinalysis, Routine w reflex microscopic -Urine, Clean Catch(!) There is evidence of urinary tract infection.  Given that the patient is immunocompromised I will likely treat with 1 g of ceftriaxone here. [CF]  1511 Lipase, blood(!) Elevated but does not meet criteria for pancreatitis. [CF]  1511 Comprehensive metabolic panel(!) No significant electrolyte abnormalities.  Creatinine at baseline.  There is some transaminitis which appears to be chronic and at baseline. [CF]  1511 CBC(!) No evidence of significant leukocytosis. [CF]  1511 Resp panel by RT-PCR (RSV, Flu A&B, Covid) Anterior Nasal Swab Negative. [CF]  1512 CT ABDOMEN PELVIS W CONTRAST I personally ordered and interpreted this study and do not see any evidence of acute abdomen.  I do agree with radiologist interpretation. [CF]    Clinical Course User Index [CF] Darletta Ehrich, PA-C   {   Click here for ABCD2, HEART and other calculators  Medical Decision Making DAYANNA INGHRAM is a 63 y.o. female patient presents to the emergency department today for further evaluation of abdominal pain and syncope.  Given the clinical context I do feel that the syncope is likely related  to vasovagal syncope as these episodes occurred while she was vomiting and bearing down.  Given the patient's history of liver transplant we will likely proceed with a CT scan to get a better picture.  This will be looking for acute hepatobiliary disease.  Also concerning for acute infectious process given the vomiting and diarrhea.  Will give the patient some pain medication and antiemetics.  Initial intake vitals does show that the patient was tachycardic and hypertensive.  During my interview her vital signs were normal on life cardiac monitoring.  This is likely a stress response.  Patient overall feeling much better.  CT scan was normal.  No signs of acute abdomen.  Treated UTI here given her immunosuppression.  Will send her with a short course of antibiotics.  I will have her follow-up with her transplant team for further evaluation.  She was encouraged to return to the emergency room if any worsening symptoms.  Strict turn precautions were discussed.  She is safe for discharge.  Amount and/or Complexity of Data Reviewed Labs: ordered. Decision-making details documented in ED Course. Radiology: ordered. Decision-making details documented in ED Course.  Risk Prescription drug management.    Final Clinical Impression(s) / ED Diagnoses Final diagnoses:  Right upper quadrant abdominal pain  Nausea and vomiting, unspecified vomiting type    Rx / DC Orders ED Discharge Orders          Ordered    promethazine  (PHENERGAN ) 25 MG tablet  Every 6 hours PRN        10/17/23 1522    cefpodoxime (VANTIN) 200 MG tablet  2 times daily        10/17/23 1524              Angelyn Kennel [place], [place] 10/17/23 1526    Hershel Los, MD 10/18/23 [phone]

## 2023-10-17 NOTE — ED Notes (Signed)
 Pt tolerating PO at this time.

## 2023-10-17 NOTE — ED Notes (Signed)
 Pt alert and oriented X 4 at the time of discharge. RR even and unlabored. No acute distress noted. Pt verbalized understanding of discharge instructions as discussed. Pt ambulatory to lobby at time of discharge.

## 2023-10-17 NOTE — ED Triage Notes (Addendum)
 Pt reports syncopal episode this morning while in the BR. Pt reports liver transplant over 30 years ago. Liver biopsy last month and anti rejection medication increased at that time. Pt has been nauseated  and multiple episodes of vomiting. Called [facility] and advised to come to ED. Complaining of abdominal burning  Landed on left side left arm and elbow pain

## 2023-10-21 DIAGNOSIS — D849 Immunodeficiency, unspecified: Secondary | ICD-10-CM | POA: Diagnosis not present

## 2023-10-21 DIAGNOSIS — K754 Autoimmune hepatitis: Secondary | ICD-10-CM | POA: Diagnosis not present

## 2023-10-21 DIAGNOSIS — Z5181 Encounter for therapeutic drug level monitoring: Secondary | ICD-10-CM | POA: Diagnosis not present

## 2023-10-21 DIAGNOSIS — Z944 Liver transplant status: Secondary | ICD-10-CM | POA: Diagnosis not present

## 2023-10-21 DIAGNOSIS — R112 Nausea with vomiting, unspecified: Secondary | ICD-10-CM | POA: Diagnosis not present

## 2023-10-22 ENCOUNTER — Telehealth: Payer: Self-pay | Admitting: Family Medicine

## 2023-10-22 NOTE — Telephone Encounter (Signed)
 LVM re: appointment change from Virtual to in office

## 2023-10-23 ENCOUNTER — Inpatient Hospital Stay (HOSPITAL_COMMUNITY)
Admission: EM | Admit: 2023-10-23 | Discharge: 2023-10-26 | DRG: 690 | Disposition: A | Discharge status: Home / Self Care | Attending: Student | Admitting: Student

## 2023-10-23 ENCOUNTER — Encounter (HOSPITAL_COMMUNITY): Payer: Self-pay

## 2023-10-23 ENCOUNTER — Encounter: Payer: Self-pay | Admitting: Family Medicine

## 2023-10-23 ENCOUNTER — Telehealth: Payer: Self-pay

## 2023-10-23 ENCOUNTER — Emergency Department (HOSPITAL_COMMUNITY)

## 2023-10-23 ENCOUNTER — Inpatient Hospital Stay: Admitting: Family Medicine

## 2023-10-23 ENCOUNTER — Ambulatory Visit (INDEPENDENT_AMBULATORY_CARE_PROVIDER_SITE_OTHER): Admitting: Family Medicine

## 2023-10-23 ENCOUNTER — Other Ambulatory Visit: Payer: Self-pay

## 2023-10-23 VITALS — BP 101/62 | HR 139 | Temp 97.7°F | Ht 65.0 in | Wt 182.4 lb

## 2023-10-23 DIAGNOSIS — Z944 Liver transplant status: Secondary | ICD-10-CM | POA: Diagnosis not present

## 2023-10-23 DIAGNOSIS — F419 Anxiety disorder, unspecified: Secondary | ICD-10-CM | POA: Diagnosis present

## 2023-10-23 DIAGNOSIS — A419 Sepsis, unspecified organism: Secondary | ICD-10-CM | POA: Diagnosis not present

## 2023-10-23 DIAGNOSIS — E119 Type 2 diabetes mellitus without complications: Secondary | ICD-10-CM

## 2023-10-23 DIAGNOSIS — E871 Hypo-osmolality and hyponatremia: Secondary | ICD-10-CM | POA: Diagnosis not present

## 2023-10-23 DIAGNOSIS — K219 Gastro-esophageal reflux disease without esophagitis: Secondary | ICD-10-CM | POA: Diagnosis not present

## 2023-10-23 DIAGNOSIS — R112 Nausea with vomiting, unspecified: Secondary | ICD-10-CM | POA: Diagnosis not present

## 2023-10-23 DIAGNOSIS — E1169 Type 2 diabetes mellitus with other specified complication: Secondary | ICD-10-CM | POA: Diagnosis not present

## 2023-10-23 DIAGNOSIS — Z8249 Family history of ischemic heart disease and other diseases of the circulatory system: Secondary | ICD-10-CM | POA: Diagnosis not present

## 2023-10-23 DIAGNOSIS — Z79624 Long term (current) use of inhibitors of nucleotide synthesis: Secondary | ICD-10-CM | POA: Diagnosis not present

## 2023-10-23 DIAGNOSIS — E860 Dehydration: Secondary | ICD-10-CM | POA: Diagnosis not present

## 2023-10-23 DIAGNOSIS — T50905D Adverse effect of unspecified drugs, medicaments and biological substances, subsequent encounter: Secondary | ICD-10-CM | POA: Diagnosis not present

## 2023-10-23 DIAGNOSIS — Z888 Allergy status to other drugs, medicaments and biological substances status: Secondary | ICD-10-CM | POA: Diagnosis not present

## 2023-10-23 DIAGNOSIS — I700 Atherosclerosis of aorta: Secondary | ICD-10-CM | POA: Diagnosis not present

## 2023-10-23 DIAGNOSIS — R000 Tachycardia, unspecified: Secondary | ICD-10-CM | POA: Diagnosis not present

## 2023-10-23 DIAGNOSIS — M797 Fibromyalgia: Secondary | ICD-10-CM | POA: Diagnosis not present

## 2023-10-23 DIAGNOSIS — D573 Sickle-cell trait: Secondary | ICD-10-CM | POA: Diagnosis present

## 2023-10-23 DIAGNOSIS — Z823 Family history of stroke: Secondary | ICD-10-CM

## 2023-10-23 DIAGNOSIS — Z833 Family history of diabetes mellitus: Secondary | ICD-10-CM

## 2023-10-23 DIAGNOSIS — E876 Hypokalemia: Secondary | ICD-10-CM | POA: Diagnosis not present

## 2023-10-23 DIAGNOSIS — N179 Acute kidney failure, unspecified: Secondary | ICD-10-CM | POA: Diagnosis not present

## 2023-10-23 DIAGNOSIS — D72829 Elevated white blood cell count, unspecified: Secondary | ICD-10-CM | POA: Diagnosis not present

## 2023-10-23 DIAGNOSIS — Z886 Allergy status to analgesic agent status: Secondary | ICD-10-CM | POA: Diagnosis not present

## 2023-10-23 DIAGNOSIS — J9811 Atelectasis: Secondary | ICD-10-CM | POA: Diagnosis not present

## 2023-10-23 DIAGNOSIS — R0602 Shortness of breath: Secondary | ICD-10-CM | POA: Diagnosis not present

## 2023-10-23 DIAGNOSIS — Z79621 Long term (current) use of calcineurin inhibitor: Secondary | ICD-10-CM

## 2023-10-23 DIAGNOSIS — K7460 Unspecified cirrhosis of liver: Secondary | ICD-10-CM | POA: Diagnosis present

## 2023-10-23 DIAGNOSIS — D849 Immunodeficiency, unspecified: Secondary | ICD-10-CM | POA: Diagnosis not present

## 2023-10-23 DIAGNOSIS — E1122 Type 2 diabetes mellitus with diabetic chronic kidney disease: Secondary | ICD-10-CM | POA: Diagnosis present

## 2023-10-23 DIAGNOSIS — Z820 Family history of epilepsy and other diseases of the nervous system: Secondary | ICD-10-CM

## 2023-10-23 DIAGNOSIS — E782 Mixed hyperlipidemia: Secondary | ICD-10-CM | POA: Diagnosis present

## 2023-10-23 DIAGNOSIS — F32A Depression, unspecified: Secondary | ICD-10-CM | POA: Diagnosis present

## 2023-10-23 DIAGNOSIS — Z7952 Long term (current) use of systemic steroids: Secondary | ICD-10-CM

## 2023-10-23 DIAGNOSIS — N390 Urinary tract infection, site not specified: Secondary | ICD-10-CM | POA: Diagnosis not present

## 2023-10-23 DIAGNOSIS — N183 Chronic kidney disease, stage 3 unspecified: Secondary | ICD-10-CM | POA: Diagnosis not present

## 2023-10-23 DIAGNOSIS — R197 Diarrhea, unspecified: Secondary | ICD-10-CM

## 2023-10-23 DIAGNOSIS — Z1152 Encounter for screening for COVID-19: Secondary | ICD-10-CM

## 2023-10-23 LAB — CBC WITH DIFFERENTIAL/PLATELET
Abs Immature Granulocytes: 0.25 10*3/uL — ABNORMAL HIGH (ref 0.00–0.07)
Basophils Absolute: 0.1 10*3/uL (ref 0.0–0.1)
Basophils Absolute: 0.1 10*3/uL (ref 0.0–0.1)
Basophils Relative: 0.3 % (ref 0.0–3.0)
Basophils Relative: 0 %
Eosinophils Absolute: 0.2 10*3/uL (ref 0.0–0.7)
Eosinophils Absolute: 0.2 10*3/uL (ref 0.0–0.5)
Eosinophils Relative: 0.8 % (ref 0.0–5.0)
Eosinophils Relative: 1 %
HCT: 39.9 % (ref 36.0–46.0)
HCT: 39.8 % (ref 36.0–46.0)
Hemoglobin: 13.3 g/dL (ref 12.0–15.0)
Hemoglobin: 13.5 g/dL (ref 12.0–15.0)
Immature Granulocytes: 1 %
Lymphocytes Relative: 9.8 % — ABNORMAL LOW (ref 12.0–46.0)
Lymphocytes Relative: 14 %
Lymphs Abs: 2 10*3/uL (ref 0.7–4.0)
Lymphs Abs: 3 10*3/uL (ref 0.7–4.0)
MCH: 28.5 pg (ref 26.0–34.0)
MCHC: 33.4 g/dL (ref 30.0–36.0)
MCHC: 33.9 g/dL (ref 30.0–36.0)
MCV: 84.4 fl (ref 78.0–100.0)
MCV: 84.1 fL (ref 80.0–100.0)
Monocytes Absolute: 2.1 10*3/uL — ABNORMAL HIGH (ref 0.1–1.0)
Monocytes Absolute: 2.2 10*3/uL — ABNORMAL HIGH (ref 0.1–1.0)
Monocytes Relative: 10.2 % (ref 3.0–12.0)
Monocytes Relative: 10 %
Neutro Abs: 16.1 10*3/uL — ABNORMAL HIGH (ref 1.4–7.7)
Neutro Abs: 16.6 10*3/uL — ABNORMAL HIGH (ref 1.7–7.7)
Neutrophils Relative %: 78.9 % — ABNORMAL HIGH (ref 43.0–77.0)
Neutrophils Relative %: 74 %
Platelets: 227 10*3/uL (ref 150.0–400.0)
Platelets: 259 10*3/uL (ref 150–400)
RBC: 4.73 Mil/uL (ref 3.87–5.11)
RBC: 4.73 MIL/uL (ref 3.87–5.11)
RDW: 14 % (ref 11.5–15.5)
RDW: 13.2 % (ref 11.5–15.5)
WBC: 20.4 10*3/uL (ref 4.0–10.5)
WBC: 22.3 10*3/uL — ABNORMAL HIGH (ref 4.0–10.5)
nRBC: 0 % (ref 0.0–0.2)

## 2023-10-23 LAB — COMPREHENSIVE METABOLIC PANEL WITH GFR
ALT: 175 U/L — ABNORMAL HIGH (ref 0–35)
ALT: 186 U/L — ABNORMAL HIGH (ref 0–44)
AST: 71 U/L — ABNORMAL HIGH (ref 0–37)
AST: 87 U/L — ABNORMAL HIGH (ref 15–41)
Albumin: 3.6 g/dL (ref 3.5–5.2)
Albumin: 3.2 g/dL — ABNORMAL LOW (ref 3.5–5.0)
Alkaline Phosphatase: 175 U/L — ABNORMAL HIGH (ref 39–117)
Alkaline Phosphatase: 205 U/L — ABNORMAL HIGH (ref 38–126)
Anion gap: 12 (ref 5–15)
BUN: 33 mg/dL — ABNORMAL HIGH (ref 6–23)
BUN: 35 mg/dL — ABNORMAL HIGH (ref 8–23)
CO2: 21 meq/L (ref 19–32)
CO2: 21 mmol/L — ABNORMAL LOW (ref 22–32)
Calcium: 9.2 mg/dL (ref 8.4–10.5)
Calcium: 8.9 mg/dL (ref 8.9–10.3)
Chloride: 94 meq/L — ABNORMAL LOW (ref 96–112)
Chloride: 95 mmol/L — ABNORMAL LOW (ref 98–111)
Creatinine, Ser: 1.66 mg/dL — ABNORMAL HIGH (ref 0.40–1.20)
Creatinine, Ser: 2.12 mg/dL — ABNORMAL HIGH (ref 0.44–1.00)
GFR, Estimated: 26 mL/min — ABNORMAL LOW (ref >60)
GFR: 32.65 mL/min — ABNORMAL LOW (ref >60.00)
Glucose, Bld: 118 mg/dL — ABNORMAL HIGH (ref 70–99)
Glucose, Bld: 143 mg/dL — ABNORMAL HIGH (ref 70–99)
Potassium: 3.3 meq/L — ABNORMAL LOW (ref 3.5–5.1)
Potassium: 3.1 mmol/L — ABNORMAL LOW (ref 3.5–5.1)
Sodium: 128 meq/L — ABNORMAL LOW (ref 135–145)
Sodium: 128 mmol/L — ABNORMAL LOW (ref 135–145)
Total Bilirubin: 0.9 mg/dL (ref 0.2–1.2)
Total Bilirubin: 1.2 mg/dL (ref 0.0–1.2)
Total Protein: 7.5 g/dL (ref 6.0–8.3)
Total Protein: 7.8 g/dL (ref 6.5–8.1)

## 2023-10-23 LAB — URINALYSIS, W/ REFLEX TO CULTURE (INFECTION SUSPECTED)
Bilirubin Urine: NEGATIVE
Glucose, UA: NEGATIVE mg/dL
Ketones, ur: NEGATIVE mg/dL
Nitrite: NEGATIVE
Protein, ur: NEGATIVE mg/dL
Specific Gravity, Urine: 1.013 (ref 1.005–1.030)
WBC, UA: >50 WBC/hpf (ref 0–5)
pH: 5 (ref 5.0–8.0)

## 2023-10-23 LAB — I-STAT CG4 LACTIC ACID, ED
Lactic Acid, Venous: 2.1 mmol/L (ref 0.5–1.9)
Lactic Acid, Venous: 1.3 mmol/L (ref 0.5–1.9)

## 2023-10-23 LAB — LIPASE: Lipase: 31 U/L (ref 11.0–59.0)

## 2023-10-23 MED ORDER — POTASSIUM CHLORIDE CRYS ER 20 MEQ PO TBCR
20.0000 meq | EXTENDED_RELEASE_TABLET | Freq: Once | ORAL | Status: AC
  Administered 2023-10-23: 20 meq via ORAL
  Filled 2023-10-23: qty 1

## 2023-10-23 MED ORDER — LACTATED RINGERS IV SOLN: INTRAVENOUS | Status: DC

## 2023-10-23 MED ORDER — SODIUM CHLORIDE 0.9 % IV BOLUS
1000.0000 mL | Freq: Once | INTRAVENOUS | Status: AC
  Administered 2023-10-23: 1000 mL via INTRAVENOUS

## 2023-10-23 MED ORDER — LACTATED RINGERS IV BOLUS (SEPSIS)
500.0000 mL | Freq: Once | INTRAVENOUS | Status: AC
  Administered 2023-10-24: 500 mL via INTRAVENOUS

## 2023-10-23 MED ORDER — SODIUM CHLORIDE 0.9 % IV SOLN: INTRAVENOUS | Status: DC

## 2023-10-23 MED ORDER — LACTATED RINGERS IV BOLUS (SEPSIS)
1000.0000 mL | Freq: Once | INTRAVENOUS | Status: AC
  Administered 2023-10-23: 1000 mL via INTRAVENOUS

## 2023-10-23 MED ORDER — SODIUM CHLORIDE 0.9 % IV SOLN
2.0000 g | INTRAVENOUS | Status: DC
  Administered 2023-10-23 – 2023-10-25 (×3): 2 g via INTRAVENOUS
  Filled 2023-10-23 (×3): qty 20

## 2023-10-23 NOTE — Progress Notes (Signed)
 Subjective  CC:  Chief Complaint  Patient presents with   Hospitalization Follow-up    10/17/2023 (3 hours) [place] [facility] at [facility]  Right upper quadrant abdominal  Pt stated that she took mycophenlate and it made her sick. Today has been the first day she has been able to get out of bed.     HPI: Cassandra [name] is a 63 y.o. female who presents to the office today to address the problems listed above in the chief complaint. Discussed the use of AI scribe software for clinical note transcription with the patient, who gave verbal consent to proceed.  History of Present Illness Cassandra [name] is a 63 year old female with a liver transplant who presents with fatigue and weakness.  She has been experiencing significant fatigue and weakness, which she attributes to recent episodes of vomiting and diarrhea. These symptoms began after taking a higher dose of her medication, which she subsequently reduced, leading to an improvement. Nausea has persisted for the past month, which she believes is related to her medication.  She describes a history of vomiting approximately 17 times, followed by additional episodes after returning home. The vomiting ceased around Saturday after receiving Phenergan  in the hospital. Diarrhea has mostly resolved except for minor episodes after taking her medication.  She experiences shortness of breath when moving from one room to another. No chest pain is present. She states that her shortness of breath is not related to her heart, as it has been checked previously.  She has not been able to eat much since Thursday, consuming only three bowls of soup, and has been primarily drinking Gatorade and water. She lacks appetite but acknowledges feeling hungry at times. She has not eaten anything on the day of the visit.  She mentions experiencing pain in her feet, which she believes is related to her medication. She also reports  feeling lightheaded upon standing and notes that her blood pressure has been low, although it typically runs high. She believes she is still dehydrated despite drinking fluids.   Assessment  1. Nausea vomiting and diarrhea   2. Adverse effect due to correct medicinal substance, properly given, subsequent encounter   3. History of liver transplant (HCC)   4. Immunosuppressed status (HCC)      Plan  Assessment and Plan Assessment & Plan Adverse effect of medication Nausea, vomiting, and joint pain improved after reducing medication dosage. Pharmacist advised against high doses. - Continue reduced dosage of medication. - Communicate with pharmacist regarding medication management.  Nausea and vomiting Symptoms subsided after reducing medication dosage. Phenergan  was ineffective. - Avoid use of Phenergan  due to lack of efficacy.  Dehydration Dehydration likely secondary to vomiting and diarrhea. Symptoms improved with reduced medication dosage. Inadequate nutrition contributing to fatigue. - Encourage increased fluid intake, including water and Gatorade. - Recheck blood work to assess hydration status. - Encourage nutritional intake to aid recovery.  Fatigue Fatigue likely due to dehydration and recent illness. Improvement noted but persists with activity. Shortness of breath likely related to fatigue and dehydration. - Encourage gradual increase in activity as tolerated. - Ensure adequate nutrition and hydration to support recovery. - Monitor for any persistent shortness of breath or fatigue.   Addendum: WBC 20K w/ left shift. Sent directly to ED for further eval.   Follow up: next week Orders Placed This Encounter  Procedures   CBC with Differential/Platelet   Comprehensive metabolic panel with GFR   Lipase  No orders of the defined types were placed in this encounter.    I reviewed the patients updated PMH, FH, and SocHx.  Patient Active Problem List   Diagnosis Date  Noted   Lumbar spinal stenosis 05/10/2022    Priority: High   Diet-controlled type 2 diabetes mellitus (HCC) 08/28/2021    Priority: High   Chronic kidney disease (CKD) stage G3a/A1, moderately decreased glomerular filtration rate (GFR) between 45-59 mL/min/1.73 square meter and albuminuria creatinine ratio less than 30 mg/g (HCC) 03/01/2021    Priority: High   Statin myopathy 05/13/2018    Priority: High   Primary insomnia 11/04/2017    Priority: High   Immunosuppressed status (HCC) 10/07/2017    Priority: High   Adenomatous polyp of colon 09/28/2017    Priority: High   Combined hyperlipidemia associated with type 2 diabetes mellitus (HCC) 03/10/2013    Priority: High   Primary osteoarthritis of knees, bilateral 03/01/2013    Priority: High   Chronic back pain 10/13/2012    Priority: High   History of liver transplant (HCC) 02/22/2009    Priority: High   Migraine with aura and without status migrainosus, not intractable 07/03/2018    Priority: Medium    Cervical facet joint syndrome 03/28/2018    Priority: Medium    Spondylosis of cervical region without myelopathy or radiculopathy 03/28/2018    Priority: Medium    Lumbar facet arthropathy 12/27/2017    Priority: Medium    Cervical stenosis of spine 01/25/2017    Priority: Medium    Fibromyalgia 08/24/2013    Priority: Medium    Hx of tuberculosis 10/13/2012    Priority: Medium    Anxiety disorder 09/16/2007    Priority: Medium    AS (sickle cell trait) (HCC) 06/29/2019    Priority: Low   Blepharitis of upper eyelids of both eyes 08/02/2023   Screening for osteoporosis 05/15/2023   No outpatient medications have been marked as taking for the 10/23/23 encounter (Office Visit) with Luevenia Saha, MD.   Current Facility-Administered Medications for the 10/23/23 encounter (Office Visit) with Luevenia Saha, MD  Medication   triamcinolone  acetonide (KENALOG -40) injection 40 mg   Allergies: Patient is allergic to  erythromycin, ezetimibe, other, rofecoxib, statins, zonisamide, carvacrol, doxycycline , elavil  [amitriptyline ], metoprolol , and nsaids. Family History: Patient family history includes Alzheimer's disease in her mother; Cancer in her sister; Dementia in her mother; Diabetes in her brother and mother; Gout in her mother; Heart disease in her sister; Hypertension in her mother and sister; Stroke in her mother. Social History:  Patient  reports that she has never smoked. She has never used smokeless tobacco. She reports that she does not drink alcohol and does not use drugs.  Review of Systems: Constitutional: Negative for fever malaise or anorexia Cardiovascular: negative for chest pain Respiratory: negative for SOB or persistent cough Gastrointestinal: negative for abdominal pain  Objective  Vitals: BP 101/62   Pulse (!) 139   Temp 97.7 F (36.5 C)   Ht 5\' 5"  (1.651 m)   Wt 182 lb 6.4 oz (82.7 kg)   SpO2 98%   BMI 30.35 kg/m  General: no acute distress , A&Ox3 HEENT: PEERL, conjunctiva normal, neck is supple Cardiovascular:  RRR without murmur or gallop.  Respiratory:  Good breath sounds bilaterally, CTAB with normal respiratory effort Soft nontender abdomen. Skin:  Warm, no rashes Commons side effects, risks, benefits, and alternatives for medications and treatment plan prescribed today were discussed, and the patient expressed understanding  of the given instructions. Patient is instructed to call or message via MyChart if he/she has any questions or concerns regarding our treatment plan. No barriers to understanding were identified. We discussed Red Flag symptoms and signs in detail. Patient expressed understanding regarding what to do in case of urgent or emergency type symptoms.  Medication list was reconciled, printed and provided to the patient in AVS. Patient instructions and summary information was reviewed with the patient as documented in the AVS. This note was prepared with  assistance of Dragon voice recognition software. Occasional wrong-word or sound-a-like substitutions may have occurred due to the inherent limitations of voice recognition software

## 2023-10-23 NOTE — ED Triage Notes (Signed)
 Pt arrived reporting she was sent by MD for WBC 23000.States went to Ed last week and this was the f/u appt with pcp. No pain at this time. States feel overall week. Adjustments made to medications recently. States N/V has stopped. Patient is seen by [facility] specialist.

## 2023-10-23 NOTE — ED Provider Triage Note (Signed)
 Emergency Medicine Provider Triage Evaluation Note  Cassandra [name] , a 63 y.o. female  was evaluated in triage.  Pt complains of SOB/abnormal labs.  Patient endorses shortness of breath for the past several days.  She had labs done with her primary care which showed an elevated white blood cell count.  Review of Systems  Positive: SOB Negative: Chest pain  Physical Exam  BP 104/73   Pulse (!) 120   Temp 98.9 F (37.2 C) (Oral)   Resp 16   Ht 5\' 5"  (1.651 m)   Wt 82 kg   SpO2 99%   BMI 30.08 kg/m  Gen:   Awake, no distress   Resp:  Normal effort  MSK:   Moves extremities without difficulty  Other:    Medical Decision Making  Medically screening exam initiated at 6:15 PM.  Appropriate orders placed.  Cassandra [name] was informed that the remainder of the evaluation will be completed by another provider, this initial triage assessment does not replace that evaluation, and the importance of remaining in the ED until their evaluation is complete.    Cassandra Dusky, PA-C 10/23/23 1816

## 2023-10-23 NOTE — ED Provider Notes (Addendum)
 [place] EMERGENCY DEPARTMENT AT [facility] Provider Note   CSN: [id number] Arrival date & time: 10/23/23  1712     History  Chief Complaint  Patient presents with   sent by doctor    Cassandra [name] is a 63 y.o. female.  63 year old female who presents due to increased white blood cell count.  Patient has history of liver transplant.  Has been having issues with her rejection medications.  Denies any fevers while she knows.  Has been having decreased oral intake but denies any vomiting or diarrhea.  No abdominal discomfort.  Has had some urinary discomfort.  Was seen about a week and a half ago for nausea vomiting diarrhea and was found to dehydrated and was medicated and sent home.  Seen here by her doctor today.  Patient is on Prograf  and CellCept       Home Medications Prior to Admission medications   Medication Sig Start Date End Date Taking? Authorizing Provider  albuterol  (VENTOLIN  HFA) 108 (90 Base) MCG/ACT inhaler Inhale 2 puffs into the lungs every 6 (six) hours as needed for wheezing or shortness of breath. 04/30/23   Christel Cousins, MD  bacitracin -polymyxin b  (POLYSPORIN ) ophthalmic ointment Place 1 Application into both eyes 2 (two) times daily. apply to eye every 12 hours while awake 08/02/23   Zorita Hiss, NP  cefpodoxime  (VANTIN ) 200 MG tablet Take 1 tablet (200 mg total) by mouth 2 (two) times daily. 10/17/23   Darletta Ehrich, PA-C  diclofenac  Sodium (VOLTAREN ) 1 % GEL Apply 4 g topically 4 (four) times daily as needed. 02/28/22   Luevenia Saha, MD  fluticasone  (FLONASE ) 50 MCG/ACT nasal spray SHAKE LIQUID AND USE 2 SPRAYS IN EACH NOSTRIL DAILY 05/11/20   Luevenia Saha, MD  levocetirizine (XYZAL ) 5 MG tablet Take 1 tablet (5 mg total) by mouth every evening. 02/10/21   Buena Carmine, NP  magnesium  oxide (MAG-OX) 400 MG tablet Take by mouth.    [provider]  mycophenolate (CELLCEPT) 500 MG tablet Take 500 mg by mouth 2 (two) times  daily.    [provider]  olopatadine  (PATANOL) 0.1 % ophthalmic solution Place 1 drop into both eyes 2 (two) times daily. 10/12/22   Allwardt, Alyssa M, PA-C  Omega-3 Fatty Acids (FISH OIL ) 1000 MG CAPS Take 1 capsule (1,000 mg total) by mouth daily. 03/16/22   Luevenia Saha, MD  predniSONE  (DELTASONE ) 1 MG tablet Take 5 mg by mouth daily. 07/13/21   [provider]  promethazine  (PHENERGAN ) 25 MG tablet Take 1 tablet (25 mg total) by mouth every 6 (six) hours as needed for nausea or vomiting. 10/17/23   Angelyn Kennel M, PA-C  tacrolimus  (PROGRAF ) 1 MG capsule Take 2 capsules (2 mg total) by mouth 2 (two) times daily. 02/22/20   Luevenia Saha, MD      Allergies    Erythromycin, Ezetimibe, Other, Rofecoxib, Statins, Zonisamide, Carvacrol, Doxycycline , Elavil  [amitriptyline ], Metoprolol , and Nsaids    Review of Systems   Review of Systems  All other systems reviewed and are negative.   Physical Exam Updated Vital Signs BP 104/73   Pulse (!) 120   Temp 98.9 F (37.2 C) (Oral)   Resp 16   Ht 1.651 m (5\' 5" )   Wt 82 kg   SpO2 99%   BMI 30.08 kg/m  Physical Exam Vitals and nursing note reviewed.  Constitutional:      General: She is not in acute  distress.    Appearance: Normal appearance. She is well-developed. She is not toxic-appearing.  HENT:     Head: Normocephalic and atraumatic.  Eyes:     General: Lids are normal.     Conjunctiva/sclera: Conjunctivae normal.     Pupils: Pupils are equal, round, and reactive to light.  Neck:     Thyroid : No thyroid  mass.     Trachea: No tracheal deviation.  Cardiovascular:     Rate and Rhythm: Regular rhythm. Tachycardia present.     Heart sounds: Normal heart sounds. No murmur heard.    No gallop.  Pulmonary:     Effort: Pulmonary effort is normal. No respiratory distress.     Breath sounds: Normal breath sounds. No stridor. No decreased breath sounds, wheezing, rhonchi or rales.  Abdominal:     General: There  is no distension.     Palpations: Abdomen is soft.     Tenderness: There is no abdominal tenderness. There is no rebound.  Musculoskeletal:        General: No tenderness. Normal range of motion.     Cervical back: Normal range of motion and neck supple.  Skin:    General: Skin is warm and dry.     Findings: No abrasion or rash.  Neurological:     Mental Status: She is alert and oriented to person, place, and time. Mental status is at baseline.     GCS: GCS eye subscore is 4. GCS verbal subscore is 5. GCS motor subscore is 6.     Cranial Nerves: No cranial nerve deficit.     Sensory: No sensory deficit.     Motor: Motor function is intact.  Psychiatric:        Attention and Perception: Attention normal.        Speech: Speech normal.        Behavior: Behavior normal.     ED Results / Procedures / Treatments   Labs (all labs ordered are listed, but only abnormal results are displayed) Labs Reviewed  COMPREHENSIVE METABOLIC PANEL WITH GFR - Abnormal; Notable for the following components:      Result Value   Sodium 128 (*)    Potassium 3.1 (*)    Chloride 95 (*)    CO2 21 (*)    Glucose, Bld 143 (*)    BUN 35 (*)    Creatinine, Ser 2.12 (*)    Albumin 3.2 (*)    AST 87 (*)    ALT 186 (*)    Alkaline Phosphatase 205 (*)    GFR, Estimated 26 (*)    All other components within normal limits  CBC WITH DIFFERENTIAL/PLATELET - Abnormal; Notable for the following components:   WBC 22.3 (*)    Neutro Abs 16.6 (*)    Monocytes Absolute 2.2 (*)    Abs Immature Granulocytes 0.25 (*)    All other components within normal limits  CULTURE, BLOOD (ROUTINE X 2)  CULTURE, BLOOD (ROUTINE X 2)  URINALYSIS, W/ REFLEX TO CULTURE (INFECTION SUSPECTED)  I-STAT CG4 LACTIC ACID, ED    EKG EKG Interpretation Date/Time:  Wednesday Oct 23 2023 18:25:17 EDT Ventricular Rate:  116 PR Interval:  143 QRS Duration:  87 QT Interval:  317 QTC Calculation: 441 R Axis:   22  Text  Interpretation: Sinus tachycardia Atrial premature complex Confirmed by Lind Repine ([id number]) on 10/23/2023 7:54:37 PM  Radiology DG Chest 2 View Result Date: 10/23/2023 CLINICAL DATA:  SOB EXAM: CHEST - 2 VIEW COMPARISON:  Chest x-ray 07/27/2021 FINDINGS: The heart and mediastinal contours are unchanged. Atherosclerotic plaque Left base atelectasis. No focal consolidation. No pulmonary edema. No pleural effusion. No pneumothorax. No acute osseous abnormality. Bilateral shoulder degenerative changes. IMPRESSION: 1. Left base atelectasis with otherwise no active cardiopulmonary disease. 2.  Aortic Atherosclerosis (ICD10-I70.0). Electronically Signed   By: Morgane  Naveau M.D.   On: 10/23/2023 19:23    Procedures Procedures    Medications Ordered in ED Medications  0.9 %  sodium chloride  infusion (has no administration in time range)  sodium chloride  0.9 % bolus 1,000 mL (has no administration in time range)    ED Course/ Medical Decision Making/ A&P                                 Medical Decision Making Risk Prescription drug management.   Patient here for leukocytosis 23,000.  Urinalysis is positive for infection.  Started on 2 g of Rocephin .  Appears clinically dehydrated which is confirmed by acute kidney injury.  Has elevated lactate of 2.1.  Patient meets SIRS criteria and will be given additional IV fluids and admitted to the hospitalist team  11:26 PM Spoke with hospitalist who requested I speak with physician at [facility].  I did speak with the Dr. Maree Shames who states that she has reviewed the patient's labs and states patient is stable to stay at [facility].        Final Clinical Impression(s) / ED Diagnoses Final diagnoses:  None    Rx / DC Orders ED Discharge Orders     None         Lind Repine, MD 10/23/23 2233    Lind Repine, MD 10/23/23 [phone]

## 2023-10-23 NOTE — Patient Instructions (Addendum)
 Please follow up as scheduled for your next visit with me: 11/07/2023   If you have any questions or concerns, please don't hesitate to send me a message via MyChart or call the office at [phone]. Thank you for visiting with us  today! It's our pleasure caring for you.

## 2023-10-23 NOTE — Sepsis Progress Note (Signed)
 Elink monitoring for the code sepsis protocol.

## 2023-10-23 NOTE — Telephone Encounter (Signed)
 [facility]  WBC 20.4   CRITICAL VALUE STICKER  CRITICAL VALUE: 20.4 WBC  RECEIVER (on-site recipient of call): Tryton Bodi  DATE & TIME NOTIFIED: Wednesday, 10/23/23-4:06pm  MESSENGER (representative from lab): [facility]  MD NOTIFIED: Gloris Lars  TIME OF NOTIFICATION: 4:11 pm   RESPONSE:

## 2023-10-24 ENCOUNTER — Encounter (HOSPITAL_COMMUNITY): Payer: Self-pay | Admitting: Internal Medicine

## 2023-10-24 DIAGNOSIS — M797 Fibromyalgia: Secondary | ICD-10-CM | POA: Diagnosis present

## 2023-10-24 DIAGNOSIS — F419 Anxiety disorder, unspecified: Secondary | ICD-10-CM | POA: Diagnosis present

## 2023-10-24 DIAGNOSIS — E860 Dehydration: Secondary | ICD-10-CM | POA: Diagnosis present

## 2023-10-24 DIAGNOSIS — Z8249 Family history of ischemic heart disease and other diseases of the circulatory system: Secondary | ICD-10-CM | POA: Diagnosis not present

## 2023-10-24 DIAGNOSIS — D72829 Elevated white blood cell count, unspecified: Secondary | ICD-10-CM | POA: Diagnosis not present

## 2023-10-24 DIAGNOSIS — E876 Hypokalemia: Secondary | ICD-10-CM | POA: Insufficient documentation

## 2023-10-24 DIAGNOSIS — Z886 Allergy status to analgesic agent status: Secondary | ICD-10-CM | POA: Diagnosis not present

## 2023-10-24 DIAGNOSIS — Z820 Family history of epilepsy and other diseases of the nervous system: Secondary | ICD-10-CM | POA: Diagnosis not present

## 2023-10-24 DIAGNOSIS — N390 Urinary tract infection, site not specified: Secondary | ICD-10-CM | POA: Insufficient documentation

## 2023-10-24 DIAGNOSIS — A419 Sepsis, unspecified organism: Secondary | ICD-10-CM | POA: Insufficient documentation

## 2023-10-24 DIAGNOSIS — N179 Acute kidney failure, unspecified: Secondary | ICD-10-CM | POA: Diagnosis not present

## 2023-10-24 DIAGNOSIS — Z79624 Long term (current) use of inhibitors of nucleotide synthesis: Secondary | ICD-10-CM | POA: Diagnosis not present

## 2023-10-24 DIAGNOSIS — E871 Hypo-osmolality and hyponatremia: Secondary | ICD-10-CM | POA: Diagnosis not present

## 2023-10-24 DIAGNOSIS — E1169 Type 2 diabetes mellitus with other specified complication: Secondary | ICD-10-CM | POA: Diagnosis present

## 2023-10-24 DIAGNOSIS — K219 Gastro-esophageal reflux disease without esophagitis: Secondary | ICD-10-CM | POA: Diagnosis present

## 2023-10-24 DIAGNOSIS — Z888 Allergy status to other drugs, medicaments and biological substances status: Secondary | ICD-10-CM | POA: Diagnosis not present

## 2023-10-24 DIAGNOSIS — K7460 Unspecified cirrhosis of liver: Secondary | ICD-10-CM | POA: Diagnosis present

## 2023-10-24 DIAGNOSIS — Z1152 Encounter for screening for COVID-19: Secondary | ICD-10-CM | POA: Diagnosis not present

## 2023-10-24 DIAGNOSIS — Z833 Family history of diabetes mellitus: Secondary | ICD-10-CM | POA: Diagnosis not present

## 2023-10-24 DIAGNOSIS — E782 Mixed hyperlipidemia: Secondary | ICD-10-CM | POA: Diagnosis present

## 2023-10-24 DIAGNOSIS — F32A Depression, unspecified: Secondary | ICD-10-CM | POA: Diagnosis present

## 2023-10-24 DIAGNOSIS — D573 Sickle-cell trait: Secondary | ICD-10-CM | POA: Diagnosis present

## 2023-10-24 DIAGNOSIS — E119 Type 2 diabetes mellitus without complications: Secondary | ICD-10-CM | POA: Diagnosis not present

## 2023-10-24 DIAGNOSIS — Z944 Liver transplant status: Secondary | ICD-10-CM | POA: Diagnosis not present

## 2023-10-24 DIAGNOSIS — N183 Chronic kidney disease, stage 3 unspecified: Secondary | ICD-10-CM | POA: Diagnosis present

## 2023-10-24 DIAGNOSIS — E1122 Type 2 diabetes mellitus with diabetic chronic kidney disease: Secondary | ICD-10-CM | POA: Diagnosis present

## 2023-10-24 DIAGNOSIS — Z823 Family history of stroke: Secondary | ICD-10-CM | POA: Diagnosis not present

## 2023-10-24 LAB — CBC
HCT: 30.9 % — ABNORMAL LOW (ref 36.0–46.0)
Hemoglobin: 10.8 g/dL — ABNORMAL LOW (ref 12.0–15.0)
MCH: 28.9 pg (ref 26.0–34.0)
MCHC: 35 g/dL (ref 30.0–36.0)
MCV: 82.6 fL (ref 80.0–100.0)
Platelets: 168 10*3/uL (ref 150–400)
RBC: 3.74 MIL/uL — ABNORMAL LOW (ref 3.87–5.11)
RDW: 13.1 % (ref 11.5–15.5)
WBC: 15.6 10*3/uL — ABNORMAL HIGH (ref 4.0–10.5)
nRBC: 0 % (ref 0.0–0.2)

## 2023-10-24 LAB — HEPATIC FUNCTION PANEL
ALT: 127 U/L — ABNORMAL HIGH (ref 0–44)
AST: 75 U/L — ABNORMAL HIGH (ref 15–41)
Albumin: 2.5 g/dL — ABNORMAL LOW (ref 3.5–5.0)
Alkaline Phosphatase: 171 U/L — ABNORMAL HIGH (ref 38–126)
Bilirubin, Direct: 0.3 mg/dL — ABNORMAL HIGH (ref 0.0–0.2)
Indirect Bilirubin: 0.6 mg/dL (ref 0.3–0.9)
Total Bilirubin: 0.9 mg/dL (ref 0.0–1.2)
Total Protein: 6.2 g/dL — ABNORMAL LOW (ref 6.5–8.1)

## 2023-10-24 LAB — HIV ANTIBODY (ROUTINE TESTING W REFLEX): HIV Screen 4th Generation wRfx: NONREACTIVE

## 2023-10-24 LAB — BASIC METABOLIC PANEL WITH GFR
Anion gap: 9 (ref 5–15)
BUN: 27 mg/dL — ABNORMAL HIGH (ref 8–23)
CO2: 20 mmol/L — ABNORMAL LOW (ref 22–32)
Calcium: 8.2 mg/dL — ABNORMAL LOW (ref 8.9–10.3)
Chloride: 104 mmol/L (ref 98–111)
Creatinine, Ser: 1.3 mg/dL — ABNORMAL HIGH (ref 0.44–1.00)
GFR, Estimated: 46 mL/min — ABNORMAL LOW (ref >60)
Glucose, Bld: 107 mg/dL — ABNORMAL HIGH (ref 70–99)
Potassium: 3.4 mmol/L — ABNORMAL LOW (ref 3.5–5.1)
Sodium: 133 mmol/L — ABNORMAL LOW (ref 135–145)

## 2023-10-24 LAB — LACTIC ACID, PLASMA
Lactic Acid, Venous: 1.2 mmol/L (ref 0.5–1.9)
Lactic Acid, Venous: 1.1 mmol/L (ref 0.5–1.9)

## 2023-10-24 LAB — CREATININE, SERUM
Creatinine, Ser: 1.02 mg/dL — ABNORMAL HIGH (ref 0.44–1.00)
GFR, Estimated: >60 mL/min (ref >60)

## 2023-10-24 MED ORDER — TACROLIMUS 1 MG PO CAPS
2.0000 mg | ORAL_CAPSULE | Freq: Two times a day (BID) | ORAL | Status: DC
Start: 2023-10-24 — End: 2023-10-26
  Administered 2023-10-24 – 2023-10-26 (×5): 2 mg via ORAL
  Filled 2023-10-24 (×5): qty 2

## 2023-10-24 MED ORDER — MAGNESIUM OXIDE -MG SUPPLEMENT 400 (240 MG) MG PO TABS
400.0000 mg | ORAL_TABLET | Freq: Every evening | ORAL | Status: DC
  Administered 2023-10-24 – 2023-10-25 (×2): 400 mg via ORAL
  Filled 2023-10-24 (×2): qty 1

## 2023-10-24 MED ORDER — OMEGA-3-ACID ETHYL ESTERS 1 G PO CAPS
1.0000 g | ORAL_CAPSULE | Freq: Every day | ORAL | Status: DC
  Administered 2023-10-24 – 2023-10-26 (×3): 1 g via ORAL
  Filled 2023-10-24 (×4): qty 1

## 2023-10-24 MED ORDER — PREDNISONE 5 MG PO TABS
5.0000 mg | ORAL_TABLET | Freq: Every day | ORAL | Status: DC
  Administered 2023-10-24 – 2023-10-26 (×3): 5 mg via ORAL
  Filled 2023-10-24 (×3): qty 1

## 2023-10-24 MED ORDER — ENOXAPARIN SODIUM 40 MG/0.4ML IJ SOSY
40.0000 mg | PREFILLED_SYRINGE | INTRAMUSCULAR | Status: DC
  Administered 2023-10-25 – 2023-10-26 (×2): 40 mg via SUBCUTANEOUS
  Filled 2023-10-24 (×2): qty 0.4

## 2023-10-24 MED ORDER — SODIUM CHLORIDE 0.9 % IV SOLN: INTRAVENOUS | Status: AC

## 2023-10-24 MED ORDER — MYCOPHENOLATE MOFETIL 250 MG PO CAPS
500.0000 mg | ORAL_CAPSULE | Freq: Two times a day (BID) | ORAL | Status: DC
  Administered 2023-10-24 – 2023-10-26 (×5): 500 mg via ORAL
  Filled 2023-10-24 (×5): qty 2

## 2023-10-24 MED ORDER — ENOXAPARIN SODIUM 30 MG/0.3ML IJ SOSY
30.0000 mg | PREFILLED_SYRINGE | INTRAMUSCULAR | Status: DC
  Administered 2023-10-24: 30 mg via SUBCUTANEOUS
  Filled 2023-10-24: qty 0.3

## 2023-10-24 NOTE — Plan of Care (Signed)

## 2023-10-24 NOTE — H&P (Addendum)
 History and Physical    Cassandra [name]:[id number] DOB: Aug 26, 1960 DOA: 10/23/2023  Patient coming from: Home.  Chief Complaint: Abnormal labs.  HPI: Cassandra [name] is a 63 y.o. female with history of liver transplant followed at [facility], diabetes mellitus type 2 on diet, was advised to come to the ER after blood work showed leukocytosis at patient's family practitioner's office.  Patient had come to the ER on 10/17/2023 with nausea vomiting weakness syncopal episode at that time workup showed possibility of UTI and was started on and antibiotics.    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  Patient's mycophenolate dose was increased to 3 times daily but patient feels that the increased dose made having nausea vomiting so patient herself decreased the dose to twice daily last 1 week.  Had recent televisit with transplant team with [facility] on 10/21/2023.  Repeat labs showed leukocytosis and primary care physician advised patient to come to the ER.  Patient has had received epidural injections before but patient states her last injection was 2021.  ED Course: In the ER patient is afebrile.  Labs show WBC count of 22.3 lactic acid of 2.1 which improved with fluids.  Creatinine worsened from 1.6-2.1.  Potassium 3.1 sodium 128.  UA is concerning for UTI chest x-ray unremarkable.  Blood cultures were drawn and started on empiric antibiotics for UTI.  ER physician had discussed with Dr. Maree Shames at [facility] who reviewed patient's labs and felt that patient can be managed at [facility].  Review of Systems: As per HPI, rest all negative.   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 She has never used smokeless tobacco. She reports that she does not drink alcohol and does not use drugs.  Allergies  Allergen Reactions   Erythromycin Diarrhea    REACTION: Nausea/vomiting   Ezetimibe Other (See Comments)    Muscle Cramps Nausea Diarrhea   Other Nausea Only and Other (See Comments)    Knocks her out, or makes her sleepy and does not take them. Muscle Cramps Nausea Diarrhea Uncoded Allergy. Allergen: vioxx   Rofecoxib Other (See Comments)    Pt not sure but knows she can not take it. REACTION: Nausea/vomiting   Statins Other (See Comments)    myalgias   Zonisamide Other (See Comments)    Fogginess Fogginess    Carvacrol  Other reaction(s): Muscle Pain   Doxycycline  Other (See Comments)    LE cramping   Elavil  [Amitriptyline ] Other (See Comments)    groggy   Metoprolol      Other reaction(s): Muscle Pain   Nsaids     REACTION: GI Cramping/nausea    Family History  Problem Relation Age of Onset   Diabetes Brother    Heart disease Sister    Hypertension Sister    Diabetes Mother    Dementia Mother    Alzheimer's disease Mother    Gout Mother    Hypertension Mother    Stroke Mother    Cancer Sister    Colon cancer Neg Hx     Prior to Admission medications   Medication Sig Start  Date End Date Taking? Authorizing Provider  bacitracin -polymyxin b  (POLYSPORIN ) ophthalmic ointment Place 1 Application into both eyes 2 (two) times daily. apply to eye every 12 hours while awake 08/02/23  Yes Zorita Hiss, NP  diclofenac  Sodium (VOLTAREN ) 1 % GEL Apply 4 g topically 4 (four) times daily as needed. 02/28/22  Yes Luevenia Saha, MD  fluticasone  (FLONASE ) 50 MCG/ACT nasal spray SHAKE LIQUID AND USE 2 SPRAYS IN EACH NOSTRIL DAILY Patient taking differently: Place 2 sprays into both nostrils daily as needed for allergies. 05/11/20  Yes Luevenia Saha, MD  levocetirizine (XYZAL ) 5 MG tablet Take 1 tablet (5 mg total) by mouth every evening. Patient taking differently: Take 5 mg by mouth daily as needed for allergies. 02/10/21  Yes Buena Carmine, NP  magnesium  oxide (MAG-OX) 400 MG tablet Take 400 mg by mouth every evening.   Yes [provider]  mycophenolate (CELLCEPT) 500 MG tablet Take 500 mg by mouth 2 (two) times daily.   Yes [provider]  olopatadine  (PATANOL) 0.1 % ophthalmic solution Place 1 drop into both eyes 2 (two) times daily. Patient taking differently: Place 1 drop into both eyes 2 (two) times daily as needed for allergies. 10/12/22  Yes Allwardt, Alyssa M, PA-C  Omega-3 Fatty Acids (FISH OIL ) 1000 MG CAPS Take 1 capsule (1,000 mg total) by mouth daily. 03/16/22  Yes Luevenia Saha, MD  predniSONE  (DELTASONE ) 5 MG tablet Take 5 mg by mouth daily with breakfast.   Yes [provider]  promethazine  (PHENERGAN ) 25 MG tablet Take 1 tablet (25 mg total) by mouth every 6 (six) hours as needed for nausea or vomiting. 10/17/23  Yes Angelyn Kennel M, PA-C  tacrolimus  (PROGRAF ) 1 MG capsule Take 2 capsules (2 mg total) by mouth 2 (two) times daily. 02/22/20  Yes Luevenia Saha, MD  tetrahydrozoline (EYE DROPS) 0.05 % ophthalmic solution Place 1 drop into both eyes 2 (two) times daily as needed.   Yes [provider]  albuterol  (VENTOLIN  HFA)  108 (90 Base) MCG/ACT inhaler Inhale 2 puffs into the lungs every 6 (six) hours as needed for wheezing or shortness of breath. Patient not taking: Reported on 10/23/2023 04/30/23   Christel Cousins, MD  cefpodoxime  (VANTIN ) 200 MG tablet Take 1 tablet (200 mg total) by mouth 2 (two) times daily. Patient not taking: Reported on 10/23/2023 10/17/23   Darletta Ehrich, PA-C    Physical Exam: Constitutional: Moderately built and nourished. Vitals:   10/23/23 1721 10/23/23 1736 10/23/23 2032 10/23/23 2355  BP: 104/73  128/78 136/72  Pulse: (!) 120  95 91  Resp: 16  18 18   Temp: 98.9 F (37.2 C)  98.8 F (37.1 C) 98.7 F (37.1  C)  TempSrc: Oral     SpO2: 99%  96% 99%  Weight:  82 kg    Height:  5\' 5"  (1.651 m)     Eyes: Anicteric no pallor. ENMT: No discharge from the ears/nose or mouth. Neck: No mass felt.  No neck rigidity. Respiratory: No rhonchi or crepitations. Cardiovascular: S1-S2 heard. Abdomen: Soft nontender bowel sound present. Musculoskeletal: No edema. Skin: No rash. Neurologic: Alert awake oriented time place and person.  Moves all extremities. Psychiatric: Appears normal.  Normal affect.   Labs on Admission: I have personally reviewed following labs and imaging studies  CBC: Recent Labs  Lab 10/17/23 1159 10/23/23 1409 10/23/23 1816  WBC 9.0 20.4 Repeated and verified X2.* 22.3*  NEUTROABS  --  16.1* 16.6*  HGB 14.7 13.3 13.5  HCT 43.0 39.9 39.8  MCV 82.7 84.4 84.1  PLT 202 227.0 259   Basic Metabolic Panel: Recent Labs  Lab 10/17/23 1159 10/23/23 1409 10/23/23 1816  NA 137 128* 128*  K 3.6 3.3* 3.1*  CL 101 94* 95*  CO2 19* 21 21*  GLUCOSE 170* 118* 143*  BUN 20 33* 35*  CREATININE 1.22* 1.66* 2.12*  CALCIUM 9.6 9.2 8.9   GFR: Estimated Creatinine Clearance: 28.7 mL/min (A) (by C-G formula based on SCr of 2.12 mg/dL (H)). Liver Function Tests: Recent Labs  Lab 10/17/23 1159 10/23/23 1409 10/23/23 1816  AST 60* 71* 87*  ALT 65* 175* 186*   ALKPHOS 167* 175* 205*  BILITOT 0.4 0.9 1.2  PROT 7.5 7.5 7.8  ALBUMIN 4.3 3.6 3.2*   Recent Labs  Lab 10/17/23 1222 10/23/23 1409  LIPASE 72* 31.0   No results for input(s): "AMMONIA" in the last 168 hours. Coagulation Profile: No results for input(s): "INR", "PROTIME" in the last 168 hours. Cardiac Enzymes: No results for input(s): "CKTOTAL", "CKMB", "CKMBINDEX", "TROPONINI" in the last 168 hours. BNP (last 3 results) No results for input(s): "PROBNP" in the last 8760 hours. HbA1C: No results for input(s): "HGBA1C" in the last 72 hours. CBG: No results for input(s): "GLUCAP" in the last 168 hours. Lipid Profile: No results for input(s): "CHOL", "HDL", "LDLCALC", "TRIG", "CHOLHDL", "LDLDIRECT" in the last 72 hours. Thyroid  Function Tests: No results for input(s): "TSH", "T4TOTAL", "FREET4", "T3FREE", "THYROIDAB" in the last 72 hours. Anemia Panel: No results for input(s): "VITAMINB12", "FOLATE", "FERRITIN", "TIBC", "IRON", "RETICCTPCT" in the last 72 hours. Urine analysis:    Component Value Date/Time   COLORURINE YELLOW 10/23/2023 2057   APPEARANCEUR CLOUDY (A) 10/23/2023 2057   LABSPEC 1.013 10/23/2023 2057   PHURINE 5.0 10/23/2023 2057   GLUCOSEU NEGATIVE 10/23/2023 2057   GLUCOSEU NEGATIVE 03/17/2013 1336   HGBUR SMALL (A) 10/23/2023 2057   BILIRUBINUR NEGATIVE 10/23/2023 2057   BILIRUBINUR Negative 04/10/2022 1058   KETONESUR NEGATIVE 10/23/2023 2057   PROTEINUR NEGATIVE 10/23/2023 2057   UROBILINOGEN 0.2 04/10/2022 1058   UROBILINOGEN 0.2 03/17/2013 1336   NITRITE NEGATIVE 10/23/2023 2057   LEUKOCYTESUR LARGE (A) 10/23/2023 2057   Sepsis Labs: @LABRCNTIP (procalcitonin:4,lacticidven:4) ) Recent Results (from the past 240 hours)  Resp panel by RT-PCR (RSV, Flu A&B, Covid) Anterior Nasal Swab     Status: None   Collection Time: 10/17/23 12:38 PM   Specimen: Anterior Nasal Swab  Result Value Ref Range Status   SARS Coronavirus 2 by RT PCR NEGATIVE NEGATIVE  Final    Comment: (NOTE) SARS-CoV-2 target nucleic acids are NOT DETECTED.  The SARS-CoV-2 RNA is generally detectable in upper respiratory specimens during the acute phase  of infection. The lowest concentration of SARS-CoV-2 viral copies this assay can detect is 138 copies/mL. A negative result does not preclude SARS-Cov-2 infection and should not be used as the sole basis for treatment or other patient management decisions. A negative result may occur with  improper specimen collection/handling, submission of specimen other than nasopharyngeal swab, presence of viral mutation(s) within the areas targeted by this assay, and inadequate number of viral copies(<138 copies/mL). A negative result must be combined with clinical observations, patient history, and epidemiological information. The expected result is Negative.  Fact Sheet for Patients:  BloggerCourse.com  Fact Sheet for Healthcare Providers:  SeriousBroker.it  This test is no t yet approved or cleared by the [place]  FDA and  has been authorized for detection and/or diagnosis of SARS-CoV-2 by FDA under an Emergency Use Authorization (EUA). This EUA will remain  in effect (meaning this test can be used) for the duration of the COVID-19 declaration under Section 564(b)(1) of the Act, 21 U.S.C.section 360bbb-3(b)(1), unless the authorization is terminated  or revoked sooner.       Influenza A by PCR NEGATIVE NEGATIVE Final   Influenza B by PCR NEGATIVE NEGATIVE Final    Comment: (NOTE) The Xpert Xpress SARS-CoV-2/FLU/RSV plus assay is intended as an aid in the diagnosis of influenza from Nasopharyngeal swab specimens and should not be used as a sole basis for treatment. Nasal washings and aspirates are unacceptable for Xpert Xpress SARS-CoV-2/FLU/RSV testing.  Fact Sheet for Patients: BloggerCourse.com  Fact Sheet for Healthcare  Providers: SeriousBroker.it  This test is not yet approved or cleared by the [place]  FDA and has been authorized for detection and/or diagnosis of SARS-CoV-2 by FDA under an Emergency Use Authorization (EUA). This EUA will remain in effect (meaning this test can be used) for the duration of the COVID-19 declaration under Section 564(b)(1) of the Act, 21 U.S.C. section 360bbb-3(b)(1), unless the authorization is terminated or revoked.     Resp Syncytial Virus by PCR NEGATIVE NEGATIVE Final    Comment: (NOTE) Fact Sheet for Patients: BloggerCourse.com  Fact Sheet for Healthcare Providers: SeriousBroker.it  This test is not yet approved or cleared by the [place]  FDA and has been authorized for detection and/or diagnosis of SARS-CoV-2 by FDA under an Emergency Use Authorization (EUA). This EUA will remain in effect (meaning this test can be used) for the duration of the COVID-19 declaration under Section 564(b)(1) of the Act, 21 U.S.C. section 360bbb-3(b)(1), unless the authorization is terminated or revoked.  Performed at [facility], [address] [place], [place] [address]      Radiological Exams on Admission: DG Chest 2 View Result Date: 10/23/2023 CLINICAL DATA:  SOB EXAM: CHEST - 2 VIEW COMPARISON:  Chest x-ray 07/27/2021 FINDINGS: The heart and mediastinal contours are unchanged. Atherosclerotic plaque Left base atelectasis. No focal consolidation. No pulmonary edema. No pleural effusion. No pneumothorax. No acute osseous abnormality. Bilateral shoulder degenerative changes. IMPRESSION: 1. Left base atelectasis with otherwise no active cardiopulmonary disease. 2.  Aortic Atherosclerosis (ICD10-I70.0). Electronically Signed   By: Morgane  Naveau M.D.   On: 10/23/2023 19:23    EKG: Independently reviewed.  Sinus tachycardia.  Assessment/Plan Principal Problem:    Leucocytosis Active Problems:   History of liver transplant (HCC)   Combined hyperlipidemia associated with type 2 diabetes mellitus (HCC)   Immunosuppressed status (HCC)   Diet-controlled type 2 diabetes mellitus (HCC)   ARF (acute renal failure) (HCC)   Hyponatremia    Possible  sepsis source not clear could be UTI but patient denies any dysuria or abdominal pain.  Will continue with ceftriaxone  follow cultures continue hydration.  Follow lactic acid levels. Acute renal failure on chronic kidney disease stage III with hyponatremia and hypokalemia could be from poor oral intake and dehydration.  Replace potassium continue hydration follow metabolic panel intake output.  Creatinine on4/9/25 was 1.4.  CT abdomen and pelvis done on 10/17/2023 did not show any renal obstruction.  Patient is making urine. Liver transplant for autoimmune hepatitis in 1992 with elevated LFTs being followed at [facility].  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  Patient's mycophenolate dose was increased but patient decreased it herself attributing to nausea vomiting.  ER physician discussed with Dr. Darrol [facility] transplant team who at this time reviewed patient's labs and felt that patient can be managed at [facility].  If there is any change to recontact them.  Follow LFTs.  Continue with mycophenolate tacrolimus  and prednisone .  Check tacrolimus  levels. History of diabetes mellitus type 2 on diet.  Last hemoglobin A1c was 6.6 about 5 months ago.   Since patient has possible sepsis will need close monitoring further workup and more than 2 midnight stay.   DVT prophylaxis: Lovenox. Code Status: Full code. Family Communication: Discussed with patient. Disposition Plan: Medical floor. Consults called: [facility] transplant team Dr. Maree Shames was contacted. Admission status: Observation.

## 2023-10-24 NOTE — ED Notes (Signed)
 ED TO INPATIENT HANDOFF REPORT  Name/Age/Gender Cassandra [name] 63 y.o. female  Code Status    Code Status Orders  (From admission, onward)           Start     Ordered   10/24/23 0241  Full code  Continuous       Question:  By:  Answer:  Consent: discussion documented in EHR   10/24/23 0242           Code Status History     This patient has a current code status but no historical code status.       Home/SNF/Other Home  Chief Complaint Leucocytosis [D72.829]  Level of Care/Admitting Diagnosis ED Disposition     ED Disposition  Admit   Condition  --   Comment  Hospital Area: [facility] [[id number]]  Level of Care: Telemetry [5]  Admit to tele based on following criteria: Monitor QTC interval  May place patient in observation at [facility] or [facility] if equivalent level of care is available:: No  Covid Evaluation: Asymptomatic - no recent exposure (last 10 days) testing not required  Diagnosis: Leucocytosis [200028]  Admitting Physician: Angelene Kelly [phone]  Attending Physician: Angelene Kelly [phone]          Medical History Past Medical History:  Diagnosis Date   Anxiety    AS (sickle cell trait) (HCC) 06/29/2019   Autoimmune hepatitis (HCC)    Chronic renal insufficiency    Cirrhosis (HCC)    COLONIC POLYPS, ADENOMATOUS, HX OF 05/05/2008   Qualifier: Diagnosis of  By: [name], [name]     Depression    External hemorrhoids    Fibromyalgia    GERD (gastroesophageal reflux disease)    Hyperlipidemia    Internal hemorrhoids    Iron deficiency anemia    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   Qualifier: Diagnosis of  By: Daphane Dynes NP, Maureen Sour     Migraine    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 Muscle Cramps Nausea Diarrhea Uncoded Allergy. Allergen: vioxx   Rofecoxib Other (See Comments)    Pt not sure but knows she can not take it. REACTION: Nausea/vomiting   Statins Other (See Comments)    myalgias   Zonisamide Other (See Comments)    Fogginess Fogginess    Carvacrol     Other reaction(s): Muscle Pain   Doxycycline  Other (See Comments)    LE cramping   Elavil  [Amitriptyline ] Other (See Comments)    groggy   Metoprolol      Other reaction(s): Muscle Pain   Nsaids     REACTION: GI Cramping/nausea    IV Location/Drains/Wounds Patient Lines/Drains/Airways Status     Active Line/Drains/Airways     Name Placement date Placement time Site Days   Peripheral IV 10/23/23 20 G Right Antecubital 10/23/23  2021  Antecubital  1            Labs/Imaging Results for orders placed or performed during the hospital encounter of 10/23/23 (from the past 48 hours)  Comprehensive metabolic panel  Status: Abnormal   Collection Time: 10/23/23  6:16 PM  Result Value Ref Range   Sodium 128 (L) 135 - 145 mmol/L   Potassium 3.1 (L) 3.5 - 5.1 mmol/L   Chloride 95 (L) 98 - 111 mmol/L   CO2 21 (L) 22 - 32 mmol/L   Glucose, Bld 143 (H) 70 - 99 mg/dL    Comment: Glucose reference range applies only to samples taken after fasting for at least 8 hours.   BUN 35 (H) 8 - 23 mg/dL   Creatinine, Ser 1.61 (H) 0.44 - 1.00 mg/dL   Calcium 8.9 8.9 - 09.6 mg/dL   Total Protein 7.8 6.5 - 8.1 g/dL   Albumin 3.2 (L) 3.5 - 5.0 g/dL   AST 87 (H) 15 - 41 U/L   ALT 186 (H) 0 - 44 U/L   Alkaline Phosphatase 205 (H) 38 - 126 U/L   Total Bilirubin 1.2 0.0 - 1.2 mg/dL   GFR, Estimated 26 (L) >60 mL/min    Comment: (NOTE) Calculated using the CKD-EPI Creatinine Equation (2021)    Anion gap 12 5 - 15    Comment: Performed at [facility], [address] [place], [place] [address]  CBC with Differential     Status: Abnormal   Collection Time: 10/23/23  6:16 PM  Result Value Ref Range   WBC 22.3 (H) 4.0 - 10.5 K/uL   RBC 4.73 3.87 - 5.11 MIL/uL   Hemoglobin 13.5 12.0 - 15.0 g/dL   HCT 98.1 19.1 - 47.8 %   MCV 84.1 80.0 - 100.0 fL   MCH 28.5 26.0 - 34.0 pg   MCHC 33.9 30.0 - 36.0 g/dL   RDW 29.5 62.1 - 30.8 %   Platelets 259 150 - 400 K/uL   nRBC 0.0 0.0 - 0.2 %   Neutrophils Relative % 74 %   Neutro Abs 16.6 (H) 1.7 - 7.7 K/uL   Lymphocytes Relative 14 %   Lymphs Abs 3.0 0.7 - 4.0 K/uL   Monocytes Relative 10 %   Monocytes Absolute 2.2 (H) 0.1 - 1.0 K/uL   Eosinophils Relative 1 %   Eosinophils Absolute 0.2 0.0 - 0.5 K/uL   Basophils Relative 0 %   Basophils Absolute 0.1 0.0 - 0.1 K/uL   Immature Granulocytes 1 %   Abs Immature Granulocytes 0.25 (H) 0.00 - 0.07 K/uL    Comment: Performed at [facility], [address] [place], [place] [address]  I-Stat CG4 Lactic Acid     Status: Abnormal   Collection Time: 10/23/23  8:26 PM  Result Value Ref Range   Lactic Acid, Venous 2.1 (HH) 0.5 - 1.9 mmol/L   Comment NOTIFIED PHYSICIAN   Urinalysis, w/ Reflex to Culture (Infection Suspected) -Urine, Clean Catch     Status: Abnormal   Collection Time: 10/23/23  8:57 PM  Result Value Ref Range   Specimen Source URINE, CLEAN CATCH    Color, Urine YELLOW YELLOW   APPearance CLOUDY (A) CLEAR   Specific Gravity, Urine 1.013 1.005 - 1.030   pH 5.0 5.0 - 8.0   Glucose, UA NEGATIVE NEGATIVE mg/dL   Hgb urine dipstick SMALL (A) NEGATIVE   Bilirubin Urine NEGATIVE NEGATIVE   Ketones, ur NEGATIVE NEGATIVE mg/dL   Protein, ur NEGATIVE NEGATIVE mg/dL   Nitrite NEGATIVE NEGATIVE   Leukocytes,Ua LARGE (A) NEGATIVE   RBC / HPF 6-10 0 - 5 RBC/hpf   WBC, UA >50 0 - 5 WBC/hpf  Comment:        Reflex urine culture not performed if WBC <=10, OR if Squamous epithelial cells >5. If  Squamous epithelial cells >5 suggest recollection.    Bacteria, UA RARE (A) NONE SEEN   Squamous Epithelial / HPF 6-10 0 - 5 /HPF   WBC Clumps PRESENT    Mucus PRESENT    Hyaline Casts, UA PRESENT     Comment: Performed at [facility], [address], [place], [place] [address]  I-Stat CG4 Lactic Acid     Status: None   Collection Time: 10/23/23 10:42 PM  Result Value Ref Range   Lactic Acid, Venous 1.3 0.5 - 1.9 mmol/L  CBC     Status: Abnormal   Collection Time: 10/24/23  3:50 AM  Result Value Ref Range   WBC 15.6 (H) 4.0 - 10.5 K/uL   RBC 3.74 (L) 3.87 - 5.11 MIL/uL   Hemoglobin 10.8 (L) 12.0 - 15.0 g/dL   HCT 60.4 (L) 54.0 - 98.1 %   MCV 82.6 80.0 - 100.0 fL   MCH 28.9 26.0 - 34.0 pg   MCHC 35.0 30.0 - 36.0 g/dL   RDW 19.1 47.8 - 29.5 %   Platelets 168 150 - 400 K/uL   nRBC 0.0 0.0 - 0.2 %    Comment: Performed at [facility], [address] [place], [place] [address]  Creatinine, serum     Status: Abnormal   Collection Time: 10/24/23  3:50 AM  Result Value Ref Range   Creatinine, Ser 1.02 (H) 0.44 - 1.00 mg/dL    Comment: DELTA CHECK NOTED   GFR, Estimated >60 >60 mL/min    Comment: (NOTE) Calculated using the CKD-EPI Creatinine Equation (2021) Performed at [facility], [address], [place], [place] [address]    DG Chest 2 View Result Date: 10/23/2023 CLINICAL DATA:  SOB EXAM: CHEST - 2 VIEW COMPARISON:  Chest x-ray 07/27/2021 FINDINGS: The heart and mediastinal contours are unchanged. Atherosclerotic plaque Left base atelectasis. No focal consolidation. No pulmonary edema. No pleural effusion. No pneumothorax. No acute osseous abnormality. Bilateral shoulder degenerative changes. IMPRESSION: 1. Left base atelectasis with otherwise no active cardiopulmonary disease. 2.  Aortic Atherosclerosis (ICD10-I70.0). Electronically Signed   By: Morgane  Naveau M.D.   On: 10/23/2023 19:23    Pending Labs Unresulted  Labs (From admission, onward)     Start     Ordered   10/31/23 0500  Creatinine, serum  (enoxaparin (LOVENOX)    CrCl >/= 30 ml/min)  Weekly,   R     Comments: while on enoxaparin therapy    10/24/23 0242   10/24/23 0241  HIV Antibody (routine testing w rflx)  (HIV Antibody (Routine testing w reflex) panel)  Once,   R        10/24/23 0242   10/23/23 1957  Culture, blood (Routine X 2) w Reflex to ID Panel  BLOOD CULTURE X 2,   R (with STAT occurrences)     Question:  Patient immune status  Answer:  Normal   10/23/23 1956            Vitals/Pain Today's Vitals   10/23/23 1736 10/23/23 2032 10/23/23 2355 10/24/23 0315  BP:  128/78 136/72 122/70  Pulse:  95 91 94  Resp:  18 18 18   Temp:  98.8 F (37.1 C) 98.7 F (37.1 C) 98.7 F (37.1 C)  TempSrc:      SpO2:  96% 99% 96%  Weight: 82 kg     Height: 5\' 5"  (1.651 m)     PainSc: 0-No pain       Isolation Precautions No active isolations  Medications Medications  cefTRIAXone  (ROCEPHIN ) 2 g in sodium chloride  0.9 % 100 mL IVPB (0 g Intravenous Stopped 10/23/23 2300)  0.9 %  sodium chloride  infusion (has no administration in time range)  predniSONE  (DELTASONE ) tablet 5 mg (has no administration in time range)  magnesium  oxide (MAG-OX) tablet 400 mg (has no administration in time range)  mycophenolate (CELLCEPT) capsule 500 mg (has no administration in time range)  tacrolimus  (PROGRAF ) capsule 2 mg (has no administration in time range)  omega-3 acid ethyl esters (LOVAZA) capsule 1 g (has no administration in time range)  enoxaparin (LOVENOX) injection 30 mg (has no administration in time range)  sodium chloride  0.9 % bolus 1,000 mL (0 mLs Intravenous Stopped 10/23/23 2237)  potassium chloride  SA (KLOR-CON  M) CR tablet 20 mEq (20 mEq Oral Given 10/23/23 2027)  lactated ringers bolus 1,000 mL (0 mLs Intravenous Stopped 10/24/23 0230)    And  lactated ringers bolus 1,000 mL (0 mLs Intravenous Stopped 10/24/23 0055)    And  lactated  ringers bolus 500 mL (0 mLs Intravenous Stopped 10/24/23 0258)    Mobility walks with person assist

## 2023-10-24 NOTE — Hospital Course (Addendum)
 63 year old woman PMH including liver transplant followed at [facility], diabetes mellitus type 2, was sent to the emergency department for leukocytosis.  Admitted for UTI, AKI, leukocytosis.  Consultants None   Procedures/Events None

## 2023-10-24 NOTE — Plan of Care (Signed)

## 2023-10-24 NOTE — Progress Notes (Signed)
  Progress Note   Patient: Cassandra [name]:[id number] DOB: [birth year] DOA: 10/23/2023     0 DOS: the patient was seen and examined on 10/24/2023   Brief hospital course: 63 year old woman PMH including liver transplant followed at [facility], diabetes mellitus type 2, was sent to the emergency department for leukocytosis.  Admitted for UTI, AKI, leukocytosis.  Consultants None   Procedures/Events None   Assessment and Plan: Leukocytosis Possible UTI Sepsis ruled out Etiology of leukocytosis not entirely clear.  Patient was started on antibiotic for UTI in the outpatient setting.  She reports no symptoms now.  Will treat empirically as UTI.  Leukocytosis has improved.  No signs or symptoms of severe infection.  AKI Baseline creatinine around 1.2.  Admission 2.12. Likely secondary to poor oral intake. Renal function improving.  Continue IV fluids and check BMP in AM. CT abdomen pelvis 5/15 no urinary obstruction noted.  Liver transplant  For autoimmune hepatitis in 1992  Elevated LFTs being followed at [facility].  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  Patient's mycophenolate dose was increased but patient decreased it herself attributing to nausea vomiting.  ER physician discussed with Dr. Darrol Emmer [facility] transplant team who at this time reviewed patient's labs and felt that patient can be managed at [facility].    Diabetes mellitus type 2 on diet.   Last hemoglobin A1c was 6.6 about 5 months ago. Appears stable  Appears to be improving clinically.  Check BMP in a.m. and continue antibiotics.  I did reach out to the [facility] liver [facility] today and spent substantial time getting in touch with the appropriate switchboard.  Nevertheless I did not receive a call back.  I will attempt again tomorrow.      Subjective:  Feels achy but ok  Physical Exam: Vitals:   10/24/23 0315  10/24/23 0438 10/24/23 0832 10/24/23 1304  BP: 122/70 123/65 103/74 119/62  Pulse: 94 97 94 80  Resp: 18 18    Temp: 98.7 F (37.1 C) 98.6 F (37 C) 99.9 F (37.7 C) 98.1 F (36.7 C)  TempSrc:  Oral Oral Oral  SpO2: 96% 100% 100%   Weight:      Height:       Physical Exam Vitals reviewed.  Constitutional:      General: She is not in acute distress.    Appearance: She is not ill-appearing or toxic-appearing.  Cardiovascular:     Rate and Rhythm: Normal rate and regular rhythm.     Heart sounds: No murmur heard. Pulmonary:     Effort: Pulmonary effort is normal. No respiratory distress.     Breath sounds: No wheezing, rhonchi or rales.  Abdominal:     Palpations: Abdomen is soft.  Neurological:     Mental Status: She is alert.  Psychiatric:        Mood and Affect: Mood normal.        Behavior: Behavior normal.     Data Reviewed: Sodium up to 133, potassium 3.4 Creatinine improved to 1.3, BUN down to 27 AST down to 75, ALT down to 127 Lactic acid normalized WBC down to 15.6 Hemoglobin down to 10.8  Family Communication: none  Disposition: Status is: Inpatient      Time spent: 45 minutes  Author: Jerline Moon, MD 10/24/2023 6:43 PM  For on call review [email or web address].

## 2023-10-25 DIAGNOSIS — D72829 Elevated white blood cell count, unspecified: Secondary | ICD-10-CM | POA: Diagnosis not present

## 2023-10-25 DIAGNOSIS — Z944 Liver transplant status: Secondary | ICD-10-CM | POA: Diagnosis not present

## 2023-10-25 DIAGNOSIS — N390 Urinary tract infection, site not specified: Secondary | ICD-10-CM | POA: Diagnosis not present

## 2023-10-25 DIAGNOSIS — E119 Type 2 diabetes mellitus without complications: Secondary | ICD-10-CM | POA: Diagnosis not present

## 2023-10-25 LAB — BASIC METABOLIC PANEL WITH GFR
Anion gap: 7 (ref 5–15)
BUN: 17 mg/dL (ref 8–23)
CO2: 20 mmol/L — ABNORMAL LOW (ref 22–32)
Calcium: 8.5 mg/dL — ABNORMAL LOW (ref 8.9–10.3)
Chloride: 110 mmol/L (ref 98–111)
Creatinine, Ser: 1.11 mg/dL — ABNORMAL HIGH (ref 0.44–1.00)
GFR, Estimated: 56 mL/min — ABNORMAL LOW (ref >60)
Glucose, Bld: 105 mg/dL — ABNORMAL HIGH (ref 70–99)
Potassium: 3.3 mmol/L — ABNORMAL LOW (ref 3.5–5.1)
Sodium: 137 mmol/L (ref 135–145)

## 2023-10-25 LAB — CBC
HCT: 33.4 % — ABNORMAL LOW (ref 36.0–46.0)
Hemoglobin: 11.2 g/dL — ABNORMAL LOW (ref 12.0–15.0)
MCH: 28.1 pg (ref 26.0–34.0)
MCHC: 33.5 g/dL (ref 30.0–36.0)
MCV: 83.9 fL (ref 80.0–100.0)
Platelets: 187 10*3/uL (ref 150–400)
RBC: 3.98 MIL/uL (ref 3.87–5.11)
RDW: 13.3 % (ref 11.5–15.5)
WBC: 11.3 10*3/uL — ABNORMAL HIGH (ref 4.0–10.5)
nRBC: 0 % (ref 0.0–0.2)

## 2023-10-25 MED ORDER — CALCIUM CARBONATE ANTACID 500 MG PO CHEW
1.0000 | CHEWABLE_TABLET | Freq: Four times a day (QID) | ORAL | Status: DC | PRN
  Administered 2023-10-25 – 2023-10-26 (×2): 200 mg via ORAL
  Filled 2023-10-25 (×2): qty 1

## 2023-10-25 MED ORDER — SODIUM CHLORIDE 0.9 % IV SOLN: INTRAVENOUS | Status: DC

## 2023-10-25 NOTE — Progress Notes (Signed)
  Progress Note   Patient: Cassandra [name] NFA:[id number] DOB: [birth year] DOA: 10/23/2023     1 DOS: the patient was seen and examined on 10/25/2023   Brief hospital course: 63 year old woman PMH including liver transplant followed at [facility], diabetes mellitus type 2, was sent to the emergency department for leukocytosis.  Admitted for UTI, AKI, leukocytosis.  Consultants None   Procedures/Events None   Assessment and Plan: Leukocytosis Possible UTI Sepsis ruled out Etiology of leukocytosis not entirely clear.  Patient was started on antibiotic for UTI in the outpatient setting.  She reports no symptoms now.  Treating empirically as UTI.   Leukocytosis improving.  Will continue antibiotics today, repeat CBC in AM. If improved tomorrow, may be able to go home.   AKI CT abdomen pelvis 5/15 no urinary obstruction noted. Baseline creatinine around 1.2.  Admission 2.12. Likely secondary to poor oral intake. Renal function appears back to baseline.     Liver transplant  For autoimmune hepatitis in 1992  Elevated LFTs being followed at [facility].  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  Patient's mycophenolate dose was increased but patient decreased it herself attributing to nausea vomiting.  ER physician discussed with Dr. Darrol Emmer [facility] transplant team who reviewed patient's labs and felt that patient can be managed at [facility].     Diabetes mellitus type 2 on diet.   Last hemoglobin A1c was 6.6 about 5 months ago. Appears stable   Overall improving.  Will continue empiric antibiotics today.  Check CMP and CBC in AM.  If leukocytosis improved and clinically feeling better, can likely go home.  Blood cultures no growth thus far.  No urine culture was obtained.      Subjective:  Was able to eat a little bit.  Feels a little bit better.  Physical Exam: Vitals:   10/24/23 1304  10/24/23 1900 10/24/23 2001 10/25/23 0448  BP: 119/62 (!) 144/82 126/62 (!) 146/76  Pulse: 80 91 85 84  Resp:  20 15 15   Temp: 98.1 F (36.7 C) 98.3 F (36.8 C) 98 F (36.7 C) 98.5 F (36.9 C)  TempSrc: Oral Oral  Oral  SpO2:  100% 98% 99%  Weight:      Height:       Physical Exam Vitals reviewed.  Constitutional:      General: She is not in acute distress.    Appearance: She is not ill-appearing or toxic-appearing.  Cardiovascular:     Rate and Rhythm: Normal rate and regular rhythm.     Heart sounds: No murmur heard. Pulmonary:     Effort: Pulmonary effort is normal. No respiratory distress.     Breath sounds: No wheezing, rhonchi or rales.  Neurological:     Mental Status: She is alert.  Psychiatric:        Mood and Affect: Mood normal.        Behavior: Behavior normal.     Data Reviewed: Potassium 3.3, replete Creatinine improved, 1.11 WBC down to 11.3  Hemoglobin stable 11.2  Family Communication: none  Disposition: Status is: Inpatient Remains inpatient appropriate because: UTI     Time spent: 20 minutes  Author: Jerline Moon, MD 10/25/2023 8:40 AM  For on call review [email or web address].

## 2023-10-26 DIAGNOSIS — E876 Hypokalemia: Secondary | ICD-10-CM

## 2023-10-26 DIAGNOSIS — Z944 Liver transplant status: Secondary | ICD-10-CM

## 2023-10-26 DIAGNOSIS — N390 Urinary tract infection, site not specified: Secondary | ICD-10-CM | POA: Diagnosis not present

## 2023-10-26 DIAGNOSIS — E119 Type 2 diabetes mellitus without complications: Secondary | ICD-10-CM | POA: Diagnosis not present

## 2023-10-26 LAB — CBC
HCT: 35.9 % — ABNORMAL LOW (ref 36.0–46.0)
Hemoglobin: 12.4 g/dL (ref 12.0–15.0)
MCH: 28.6 pg (ref 26.0–34.0)
MCHC: 34.5 g/dL (ref 30.0–36.0)
MCV: 82.7 fL (ref 80.0–100.0)
Platelets: 227 10*3/uL (ref 150–400)
RBC: 4.34 MIL/uL (ref 3.87–5.11)
RDW: 13.1 % (ref 11.5–15.5)
WBC: 10.3 10*3/uL (ref 4.0–10.5)
nRBC: 0 % (ref 0.0–0.2)

## 2023-10-26 LAB — COMPREHENSIVE METABOLIC PANEL WITH GFR
ALT: 119 U/L — ABNORMAL HIGH (ref 0–44)
AST: 75 U/L — ABNORMAL HIGH (ref 15–41)
Albumin: 2.7 g/dL — ABNORMAL LOW (ref 3.5–5.0)
Alkaline Phosphatase: 269 U/L — ABNORMAL HIGH (ref 38–126)
Anion gap: 11 (ref 5–15)
BUN: 11 mg/dL (ref 8–23)
CO2: 21 mmol/L — ABNORMAL LOW (ref 22–32)
Calcium: 8.7 mg/dL — ABNORMAL LOW (ref 8.9–10.3)
Chloride: 105 mmol/L (ref 98–111)
Creatinine, Ser: 1.18 mg/dL — ABNORMAL HIGH (ref 0.44–1.00)
GFR, Estimated: 52 mL/min — ABNORMAL LOW (ref >60)
Glucose, Bld: 96 mg/dL (ref 70–99)
Potassium: 3.3 mmol/L — ABNORMAL LOW (ref 3.5–5.1)
Sodium: 137 mmol/L (ref 135–145)
Total Bilirubin: 0.8 mg/dL (ref 0.0–1.2)
Total Protein: 7 g/dL (ref 6.5–8.1)

## 2023-10-26 LAB — TACROLIMUS LEVEL: Tacrolimus (FK506) - LabCorp: 13.4 ng/mL (ref 5.0–20.0)

## 2023-10-26 MED ORDER — POTASSIUM CHLORIDE CRYS ER 20 MEQ PO TBCR
60.0000 meq | EXTENDED_RELEASE_TABLET | Freq: Once | ORAL | Status: AC
  Administered 2023-10-26: 60 meq via ORAL
  Filled 2023-10-26: qty 3

## 2023-10-26 NOTE — Progress Notes (Signed)
   10/26/23 1126  TOC Brief Assessment  Insurance and Status Reviewed  Patient has primary care physician Yes  Home environment has been reviewed apartment  Prior level of function: independent  Prior/Current Home Services No current home services  Social Drivers of Health Review SDOH reviewed no interventions necessary  Readmission risk has been reviewed Yes  Transition of care needs no transition of care needs at this time    [place], MSW, LCSW 10/26/2023 11:27 AM

## 2023-10-26 NOTE — Progress Notes (Signed)
 AVS reviewed w/ pt who verbalized an understanding- no other questions at this time- pt dressed for d/c to home- PIV removed as noted - pt to lobby via w/c - home w/ family

## 2023-10-26 NOTE — Discharge Summary (Signed)
 Physician Discharge Summary  Cassandra [name] ZOX:[id number] DOB: [birth year] DOA: 10/23/2023  PCP: Luevenia Saha, MD  Admit date: 10/23/2023 Discharge date: 10/26/23  Admitted From: Home Disposition: Home Recommendations for Outpatient Follow-up:  Follow up with PCP in 1 to 2 weeks Advised to follow-up with her liver transplant providers Check CMP and CBC at follow-up. Please follow up on the following pending results: None  Home Health: No need identified Equipment/Devices: No need identified  Discharge Condition: Stable CODE STATUS: Full code  Follow-up Information     Luevenia Saha, MD. Schedule an appointment as soon as possible for a visit in 1 week(s).   Specialty: Family Medicine Contact information: [address] [place] [address] [phone]                 Hospital course 63 y.o. female with history of liver transplant followed at [facility], diabetes mellitus type 2 on diet and recent UTI for which she was on cefpodoxime  for 5 of 7 days course advised to come to the ER after blood work showed leukocytosis at patient's family practitioner's office.  Patient had come to the ER on 10/17/2023 with nausea vomiting weakness syncopal episode at that time workup showed possibility of UTI and was started on cefpodoxime .   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  Patient's mycophenolate dose was increased to 3 times daily but patient feels that the increased dose made having nausea vomiting so patient herself decreased the dose to twice daily last 1 week.  Had recent televisit with transplant team with [facility] on 10/21/2023.   Repeat labs showed leukocytosis and primary care physician advised patient to come to the ER.   In ED, afebrile.  WBC 22.3 with left shift.  Lactic acid was 2.1 but resolved with fluids. Cr 1.66 (was 1.22 on 5/15).  K3.1.  Sodium 128.  UA with  pyuria, rare bacteria and no nitrites. Blood cultures were drawn and started on empiric antibiotics for UTI.  ER physician had discussed with Dr. Maree Shames at [facility] who reviewed patient's labs and felt that patient can be managed at [facility].  Unfortunately, urine culture was not sent.  Patient's blood culture NGTD.  Unfortunately, urine culture was not sent on admission.  On further questioning, patient denies UTI symptoms such as dysuria, frequency, urgency, fever, suprapubic pain or new back pain on the day of presentation.  She reports taking a cefpodoxime  for 5 days when she was advised to go to ED.  On the day of discharge, patient felt well.  She received IV ceftriaxone  for 3 days which should complete a total of 8 days course.  AKI, hyponatremia and leukocytosis resolved.  Her potassium was 3.3 for which she received p.o. KCl 60 mill equivalent x 1 prior to discharge.  She had no nausea, vomiting or abdominal pain.  Tolerating regular diet.  She is discharged to follow-up with PCP and her liver transplant physicians.  Advised to maintain good hydration.   See individual problem list below for more.   Problems addressed during this hospitalization Principal Problem:   UTI (urinary tract infection) Active Problems:   History of liver transplant (HCC)   Immunosuppressed status (HCC)   Diet-controlled type 2 diabetes mellitus (HCC)   Leucocytosis   ARF (acute renal failure) (HCC)   Hyponatremia   Hypokalemia  Time spent 35 minutes  Vital signs Vitals:   10/25/23 0448 10/25/23 1311 10/25/23 2037 10/26/23 0416  BP: (!) 146/76 (!) 165/77 (!) 163/82 (!) 151/82  Pulse: 84 80 79 81  Temp: 98.5 F (36.9 C) 98.7 F (37.1 C) 98.7 F (37.1 C) 98.7 F (37.1 C)  Resp: 15 20 14 16   Height:      Weight:      SpO2: 99% 98% 100% 98%  TempSrc: Oral Oral Oral Oral  BMI (Calculated):         Discharge exam  GENERAL: No apparent  distress.  Nontoxic. HEENT: MMM.  Vision and hearing grossly intact.  NECK: Supple.  No apparent JVD.  RESP:  No IWOB.  Fair aeration bilaterally. CVS:  RRR. Heart sounds normal.  ABD/GI/GU: BS+. Abd soft, NTND.  MSK/EXT:  Moves extremities. No apparent deformity. No edema.  SKIN: no apparent skin lesion or wound NEURO: Awake and alert. Oriented appropriately.  No apparent focal neuro deficit. PSYCH: Calm. Normal affect.   Discharge Instructions Discharge Instructions     Diet Carb Modified   Complete by: As directed    Discharge instructions   Complete by: As directed    It has been a pleasure taking care of you!  You were hospitalized due to dehydration, acute kidney injury and possible urinary tract infection.  Your dehydration has resolved.  Your kidney has recovered.  You have completed antibiotic course for possible urinary tract infection.  Follow-up with your primary care doctor in 1 to 2 weeks.  You may follow-up with your hepatologist/transplant doctors per their recommendation.  Maintain good hydration.   Take care,   Increase activity slowly   Complete by: As directed       Allergies as of 10/26/2023       Reactions   Erythromycin Diarrhea   REACTION: Nausea/vomiting   Ezetimibe Other (See Comments)   Muscle Cramps Nausea Diarrhea   Other Nausea Only, Other (See Comments)   Knocks her out, or makes her sleepy and does not take them. Muscle Cramps Nausea Diarrhea Uncoded Allergy. Allergen: vioxx   Rofecoxib Other (See Comments)   Pt not sure but knows she can not take it. REACTION: Nausea/vomiting   Statins Other (See Comments)   myalgias   Zonisamide Other (See Comments)   Fogginess Fogginess   Carvacrol    Other reaction(s): Muscle Pain   Doxycycline  Other (See Comments)   LE cramping   Elavil  [amitriptyline ] Other (See Comments)   groggy   Metoprolol     Other reaction(s): Muscle Pain   Nsaids    REACTION: GI Cramping/nausea         Medication List     STOP taking these medications    cefpodoxime  200 MG tablet Commonly known as: VANTIN        TAKE these medications    albuterol  108 (90 Base) MCG/ACT inhaler Commonly known as: VENTOLIN  HFA Inhale 2 puffs into the lungs every 6 (six) hours as needed for wheezing or shortness of breath.   bacitracin -polymyxin b  ophthalmic ointment Commonly known as: POLYSPORIN  Place 1 Application into both eyes 2 (two) times daily. apply to eye every 12 hours while awake   diclofenac  Sodium 1 % Gel Commonly known as: Voltaren  Apply 4 g topically 4 (four) times daily as needed.   Eye Drops 0.05 % ophthalmic solution Generic drug: tetrahydrozoline Place 1 drop into both eyes 2 (two) times daily as needed.   Fish Oil  1000 MG Caps Take 1 capsule (  1,000 mg total) by mouth daily.   fluticasone  50 MCG/ACT nasal spray Commonly known as: FLONASE  SHAKE LIQUID AND USE 2 SPRAYS IN EACH NOSTRIL DAILY What changed: See the new instructions.   levocetirizine 5 MG tablet Commonly known as: XYZAL  Take 1 tablet (5 mg total) by mouth every evening. What changed:  when to take this reasons to take this   magnesium  oxide 400 MG tablet Commonly known as: MAG-OX Take 400 mg by mouth every evening.   mycophenolate 500 MG tablet Commonly known as: CELLCEPT Take 500 mg by mouth 2 (two) times daily.   olopatadine  0.1 % ophthalmic solution Commonly known as: PATANOL Place 1 drop into both eyes 2 (two) times daily. What changed:  when to take this reasons to take this   predniSONE  5 MG tablet Commonly known as: DELTASONE  Take 5 mg by mouth daily with breakfast.   promethazine  25 MG tablet Commonly known as: PHENERGAN  Take 1 tablet (25 mg total) by mouth every 6 (six) hours as needed for nausea or vomiting.   tacrolimus  1 MG capsule Commonly known as: Prograf  Take 2 capsules (2 mg total) by mouth 2 (two) times daily.        Consultations: Patient's liver transplant  clinic at [facility]  Procedures/Studies:   DG Chest 2 View Result Date: 10/23/2023 CLINICAL DATA:  SOB EXAM: CHEST - 2 VIEW COMPARISON:  Chest x-ray 07/27/2021 FINDINGS: The heart and mediastinal contours are unchanged. Atherosclerotic plaque Left base atelectasis. No focal consolidation. No pulmonary edema. No pleural effusion. No pneumothorax. No acute osseous abnormality. Bilateral shoulder degenerative changes. IMPRESSION: 1. Left base atelectasis with otherwise no active cardiopulmonary disease. 2.  Aortic Atherosclerosis (ICD10-I70.0). Electronically Signed   By: Morgane  Naveau M.D.   On: 10/23/2023 19:23   CT ABDOMEN PELVIS W CONTRAST Result Date: 10/17/2023 CLINICAL DATA:  Abdominal pain. EXAM: CT ABDOMEN AND PELVIS WITH CONTRAST TECHNIQUE: Multidetector CT imaging of the abdomen and pelvis was performed using the standard protocol following bolus administration of intravenous contrast. RADIATION DOSE REDUCTION: This exam was performed according to the departmental dose-optimization program which includes automated exposure control, adjustment of the mA and/or kV according to patient size and/or use of iterative reconstruction technique. CONTRAST:  OMNIPAQUE  IOHEXOL  300 MG/ML  SOLN COMPARISON:  CT dated 07/27/2021. FINDINGS: Lower chest: The visualized lung bases are clear. No intra-abdominal free air or free fluid. Hepatobiliary: Postsurgical changes of liver transplant as seen on the prior CT. Indeterminate small area of decreased enhancement involving the caudate and left lobe of the liver of indeterminate etiology and may represent areas of fatty infiltration or inflammation. Correlation with LFTs recommended. There is dilatation of the common bile duct similar to prior CT. Cholecystectomy. Pancreas: Unremarkable. No pancreatic ductal dilatation or surrounding inflammatory changes. Spleen: Normal in size without focal abnormality. Adrenals/Urinary Tract: The adrenal glands are unremarkable.  Small left renal interpolar cyst. There is no hydronephrosis on either side. There is symmetric enhancement and excretion of contrast by both kidneys. The visualized ureters and urinary bladder appear unremarkable. Stomach/Bowel: There is no bowel obstruction or active inflammation. The appendix is normal. Vascular/Lymphatic: Advanced aortoiliac atherosclerotic disease. The IVC is unremarkable. No portal venous gas. There is no adenopathy. Reproductive: Hysterectomy.  No suspicious adnexal masses Other: None Musculoskeletal: Degenerative changes of the spine. No acute osseous pathology. IMPRESSION: 1. Status post prior liver transplant. Small areas of decreased enhancement involving the caudate and left lobe of the liver may represent fatty infiltration or inflammation. Correlation with  LFTs recommended. 2. No bowel obstruction. Normal appendix. 3.  Aortic Atherosclerosis (ICD10-I70.0). Electronically Signed   By: Angus Bark M.D.   On: 10/17/2023 13:57       The results of significant diagnostics from this hospitalization (including imaging, microbiology, ancillary and laboratory) are listed below for reference.     Microbiology: Recent Results (from the past 240 hours)  Resp panel by RT-PCR (RSV, Flu A&B, Covid) Anterior Nasal Swab     Status: None   Collection Time: 10/17/23 12:38 PM   Specimen: Anterior Nasal Swab  Result Value Ref Range Status   SARS Coronavirus 2 by RT PCR NEGATIVE NEGATIVE Final    Comment: (NOTE) SARS-CoV-2 target nucleic acids are NOT DETECTED.  The SARS-CoV-2 RNA is generally detectable in upper respiratory specimens during the acute phase of infection. The lowest concentration of SARS-CoV-2 viral copies this assay can detect is 138 copies/mL. A negative result does not preclude SARS-Cov-2 infection and should not be used as the sole basis for treatment or other patient management decisions. A negative result may occur with  improper specimen  collection/handling, submission of specimen other than nasopharyngeal swab, presence of viral mutation(s) within the areas targeted by this assay, and inadequate number of viral copies(<138 copies/mL). A negative result must be combined with clinical observations, patient history, and epidemiological information. The expected result is Negative.  Fact Sheet for Patients:  BloggerCourse.com  Fact Sheet for Healthcare Providers:  SeriousBroker.it  This test is no t yet approved or cleared by the [place]  FDA and  has been authorized for detection and/or diagnosis of SARS-CoV-2 by FDA under an Emergency Use Authorization (EUA). This EUA will remain  in effect (meaning this test can be used) for the duration of the COVID-19 declaration under Section 564(b)(1) of the Act, 21 U.S.C.section 360bbb-3(b)(1), unless the authorization is terminated  or revoked sooner.       Influenza A by PCR NEGATIVE NEGATIVE Final   Influenza B by PCR NEGATIVE NEGATIVE Final    Comment: (NOTE) The Xpert Xpress SARS-CoV-2/FLU/RSV plus assay is intended as an aid in the diagnosis of influenza from Nasopharyngeal swab specimens and should not be used as a sole basis for treatment. Nasal washings and aspirates are unacceptable for Xpert Xpress SARS-CoV-2/FLU/RSV testing.  Fact Sheet for Patients: BloggerCourse.com  Fact Sheet for Healthcare Providers: SeriousBroker.it  This test is not yet approved or cleared by the [place]  FDA and has been authorized for detection and/or diagnosis of SARS-CoV-2 by FDA under an Emergency Use Authorization (EUA). This EUA will remain in effect (meaning this test can be used) for the duration of the COVID-19 declaration under Section 564(b)(1) of the Act, 21 U.S.C. section 360bbb-3(b)(1), unless the authorization is terminated or revoked.     Resp Syncytial  Virus by PCR NEGATIVE NEGATIVE Final    Comment: (NOTE) Fact Sheet for Patients: BloggerCourse.com  Fact Sheet for Healthcare Providers: SeriousBroker.it  This test is not yet approved or cleared by the [place]  FDA and has been authorized for detection and/or diagnosis of SARS-CoV-2 by FDA under an Emergency Use Authorization (EUA). This EUA will remain in effect (meaning this test can be used) for the duration of the COVID-19 declaration under Section 564(b)(1) of the Act, 21 U.S.C. section 360bbb-3(b)(1), unless the authorization is terminated or revoked.  Performed at [facility], [address] [place], [place] [address]   Culture, blood (Routine X 2) w Reflex to ID Panel  Status: None (Preliminary result)   Collection Time: 10/23/23  8:18 PM   Specimen: BLOOD LEFT FOREARM  Result Value Ref Range Status   Specimen Description   Final    BLOOD LEFT FOREARM Performed at [facility], [address], [place], [place] [address]    Special Requests   Final    BOTTLES DRAWN AEROBIC AND ANAEROBIC Blood Culture results may not be optimal due to an inadequate volume of blood received in culture bottles Performed at [facility], [address], [place], [place] [address]    Culture   Final    NO GROWTH 3 DAYS Performed at [facility], [address], [place], [place] [address]    Report Status PENDING  Incomplete  Culture, blood (Routine X 2) w Reflex to ID Panel     Status: None (Preliminary result)   Collection Time: 10/23/23  8:18 PM   Specimen: BLOOD  Result Value Ref Range Status   Specimen Description   Final    BLOOD LEFT ANTECUBITAL Performed at [facility], [address], [place], [place] [address]    Special Requests   Final    BOTTLES DRAWN AEROBIC AND ANAEROBIC Blood Culture results may not be optimal due to an inadequate volume of blood  received in culture bottles Performed at [facility], [address] [place], [place] [address]    Culture   Final    NO GROWTH 3 DAYS Performed at [facility], [address], [place], [place] [address]    Report Status PENDING  Incomplete     Labs:  CBC: Recent Labs  Lab 10/23/23 1409 10/23/23 1816 10/24/23 0350 10/25/23 0354 10/26/23 0442  WBC 20.4 Repeated and verified X2.* 22.3* 15.6* 11.3* 10.3  NEUTROABS 16.1* 16.6*  --   --   --   HGB 13.3 13.5 10.8* 11.2* 12.4  HCT 39.9 39.8 30.9* 33.4* 35.9*  MCV 84.4 84.1 82.6 83.9 82.7  PLT 227.0 259 168 187 227   BMP &GFR Recent Labs  Lab 10/23/23 1409 10/23/23 1816 10/24/23 0350 10/24/23 0802 10/25/23 0354 10/26/23 0442  NA 128* 128*  --  133* 137 137  K 3.3* 3.1*  --  3.4* 3.3* 3.3*  CL 94* 95*  --  104 110 105  CO2 21 21*  --  20* 20* 21*  GLUCOSE 118* 143*  --  107* 105* 96  BUN 33* 35*  --  27* 17 11  CREATININE 1.66* 2.12* 1.02* 1.30* 1.11* 1.18*  CALCIUM 9.2 8.9  --  8.2* 8.5* 8.7*   Estimated Creatinine Clearance: 51.6 mL/min (A) (by C-G formula based on SCr of 1.18 mg/dL (H)). Liver & Pancreas: Recent Labs  Lab 10/23/23 1409 10/23/23 1816 10/24/23 0802 10/26/23 0442  AST 71* 87* 75* 75*  ALT 175* 186* 127* 119*  ALKPHOS 175* 205* 171* 269*  BILITOT 0.9 1.2 0.9 0.8  PROT 7.5 7.8 6.2* 7.0  ALBUMIN 3.6 3.2* 2.5* 2.7*   Recent Labs  Lab 10/23/23 1409  LIPASE 31.0   No results for input(s): "AMMONIA" in the last 168 hours. Diabetic: No results for input(s): "HGBA1C" in the last 72 hours. No results for input(s): "GLUCAP" in the last 168 hours. Cardiac Enzymes: No results for input(s): "CKTOTAL", "CKMB", "CKMBINDEX", "TROPONINI" in the last 168 hours. No results for input(s): "PROBNP" in the last 8760 hours. Coagulation Profile: No results for input(s): "INR", "PROTIME" in the last 168 hours.  Thyroid  Function Tests: No results for input(s): "TSH", "T4TOTAL",  "FREET4", "T3FREE", "THYROIDAB" in the last 72 hours. Lipid Profile: No results for input(s): "CHOL", "HDL", "LDLCALC", "TRIG", "CHOLHDL", "LDLDIRECT" in the last 72 hours. Anemia Panel: No results for input(s): "VITAMINB12", "FOLATE", "FERRITIN", "TIBC", "IRON", "RETICCTPCT" in the last 72 hours. Urine analysis:    Component Value Date/Time   COLORURINE YELLOW 10/23/2023 2057   APPEARANCEUR CLOUDY (A) 10/23/2023 2057   LABSPEC 1.013 10/23/2023 2057   PHURINE 5.0 10/23/2023 2057   GLUCOSEU NEGATIVE 10/23/2023 2057   GLUCOSEU NEGATIVE 03/17/2013 1336   HGBUR SMALL (A) 10/23/2023 2057   BILIRUBINUR NEGATIVE 10/23/2023 2057   BILIRUBINUR Negative 04/10/2022 1058   KETONESUR NEGATIVE 10/23/2023 2057   PROTEINUR NEGATIVE 10/23/2023 2057   UROBILINOGEN 0.2 04/10/2022 1058   UROBILINOGEN 0.2 03/17/2013 1336   NITRITE NEGATIVE 10/23/2023 2057   LEUKOCYTESUR LARGE (A) 10/23/2023 2057   Sepsis Labs: Invalid input(s): "PROCALCITONIN", "LACTICIDVEN"   SIGNED:  Theadore Finger, MD  [facility] 10/26/2023, 5:56 PM

## 2023-10-28 LAB — CULTURE, BLOOD (ROUTINE X 2)
Culture: NO GROWTH
Culture: NO GROWTH

## 2023-10-29 ENCOUNTER — Telehealth: Payer: Self-pay | Admitting: *Deleted

## 2023-10-29 NOTE — Transitions of Care (Post Inpatient/ED Visit) (Signed)
 10/29/2023  Name: Cassandra [name] MRN: [id number] DOB: [birth year]  Today's TOC FU Call Status: Today's TOC FU Call Status:: Successful TOC FU Call Completed TOC FU Call Complete Date: 10/29/23 Patient's Name and Date of Birth confirmed.  Transition Care Management Follow-up Telephone Call Date of Discharge: 10/24/23 Discharge Facility: [facility]) Type of Discharge: Inpatient Admission Primary Inpatient Discharge Diagnosis:: urinary tract infection How have you been since you were released from the hospital?: Better Any questions or concerns?: No  Items Reviewed: Did you receive and understand the discharge instructions provided?: Yes Medications obtained,verified, and reconciled?: Yes (Medications Reviewed) Any new allergies since your discharge?: No Dietary orders reviewed?: No Do you have support at home?: Yes People in Home [RPT]: alone Name of Support/Comfort Primary Source: Brandy  Medications Reviewed Today: Medications Reviewed Today     Reviewed by Eilene Grater, RN (Case Manager) on 10/29/23 at [phone]  Med List Status: <None>   Medication Order Taking? Sig Documenting Provider Last Dose Status Informant  albuterol  (VENTOLIN  HFA) 108 (90 Base) MCG/ACT inhaler [id number] No Inhale 2 puffs into the lungs every 6 (six) hours as needed for wheezing or shortness of breath.  Patient not taking: Reported on 10/23/2023   Christel Cousins, MD Not Taking Active Self  bacitracin -polymyxin b  (POLYSPORIN ) ophthalmic ointment [id number] Yes Place 1 Application into both eyes 2 (two) times daily. apply to eye every 12 hours while awake Zorita Hiss, NP Taking Active Self  diclofenac  Sodium (VOLTAREN ) 1 % GEL [id number] Yes Apply 4 g topically 4 (four) times daily as needed. Luevenia Saha, MD Taking Active Self  fluticasone  (FLONASE ) 50 MCG/ACT nasal spray [id number] Yes SHAKE LIQUID AND USE 2 SPRAYS IN EACH NOSTRIL DAILY  Patient taking differently: Place 2 sprays into both  nostrils daily as needed for allergies.   Luevenia Saha, MD Taking Active Self  levocetirizine (XYZAL ) 5 MG tablet [id number] Yes Take 1 tablet (5 mg total) by mouth every evening.  Patient taking differently: Take 5 mg by mouth daily as needed for allergies.   Buena Carmine, NP Taking Active Self  magnesium  oxide (MAG-OX) 400 MG tablet [id number] Yes Take 400 mg by mouth every evening. [provider] Taking Active Self  mycophenolate (CELLCEPT) 500 MG tablet [id number] Yes Take 500 mg by mouth 2 (two) times daily. [provider] Taking Active Self  olopatadine  (PATANOL) 0.1 % ophthalmic solution [id number] Yes Place 1 drop into both eyes 2 (two) times daily.  Patient taking differently: Place 1 drop into both eyes 2 (two) times daily as needed for allergies.   Allwardt, Deleta Felix, PA-C Taking Active Self  Omega-3 Fatty Acids (FISH OIL ) 1000 MG CAPS [id number] Yes Take 1 capsule (1,000 mg total) by mouth daily. Luevenia Saha, MD Taking Active Self  predniSONE  (DELTASONE ) 5 MG tablet [id number] Yes Take 5 mg by mouth daily with breakfast. [provider] Taking Active Self  promethazine  (PHENERGAN ) 25 MG tablet [id number] Yes Take 1 tablet (25 mg total) by mouth every 6 (six) hours as needed for nausea or vomiting. Angelyn Kennel M, PA-C Taking Active Self  tacrolimus  (PROGRAF ) 1 MG capsule [id number] Yes Take 2 capsules (2 mg total) by mouth 2 (two) times daily. Luevenia Saha, MD Taking Active Self  tetrahydrozoline (EYE DROPS) 0.05 % ophthalmic solution [id number] Yes Place 1 drop into both eyes 2 (two) times daily as needed. [provider] Taking Active Self  triamcinolone  acetonide (KENALOG -40) injection 40 mg [id number]  Luevenia Saha, MD  Active             Home Care and Equipment/Supplies: Were Home Health Services Ordered?: NA Any new equipment or medical supplies ordered?: NA  Functional Questionnaire: Do you need assistance with  bathing/showering or dressing?: No Do you need assistance with meal preparation?: No Do you need assistance with eating?: No Do you have difficulty maintaining continence: No Do you need assistance with getting out of bed/getting out of a chair/moving?: No Do you have difficulty managing or taking your medications?: No  Follow up appointments reviewed: PCP Follow-up appointment confirmed?: Yes Date of PCP follow-up appointment?: 11/07/23 Follow-up Provider: Dr Karma Oz Specialist [facility] Follow-up appointment confirmed?: NA Do you need transportation to your follow-up appointment?: No Do you understand care options if your condition(s) worsen?: Yes-patient verbalized understanding  SDOH Interventions Today    Flowsheet Row Most Recent Value  SDOH Interventions   Food Insecurity Interventions Intervention Not Indicated  Housing Interventions Intervention Not Indicated  Transportation Interventions Intervention Not Indicated, Patient Resources (Friends/Family)  Utilities Interventions Intervention Not Indicated       Goals Addressed             This Visit's Progress    [facility] Transitions of Care (TOC) Care Plan       Problems:  Recent Hospitalization for treatment of CKD Stage 3A and Urinary tract infection Diet/Nutrition/Food Resources Patient was not taking in enough fluids and became dehydrated  Goal:  Over the next 30 days, the patient will not experience hospital readmission  Interventions:    Chronic Kidney Disease Interventions: Evaluation of current treatment plan related to chronic kidney disease self management and patient's adherence to plan as established by provider      Reviewed medications with patient and discussed importance of compliance    Reviewed scheduled/upcoming provider appointments including   Scheduling an appointment with the Transplant providers Last practice recorded BP readings:  BP Readings from Last 3 Encounters:  10/26/23 (!) 151/82   10/23/23 101/62  10/17/23 (!) 148/94   Most recent eGFR/CrCl: No results found for: "EGFR"  No components found for: "CRCL"  Patient Self Care Activities:        Increase fluid intake Attend all scheduled provider appointments Call pharmacy for medication refills 3-7 days in advance of running out of medications Call provider office for new concerns or questions  Notify RN Care Manager of TOC call rescheduling needs Participate in Transition of Care Program/Attend TOC scheduled calls Perform all self care activities independently  Perform IADL's (shopping, preparing meals, housekeeping, managing finances) independently Take medications as prescribed    Plan:  An initial telephone outreach has been scheduled for: [id number] Next PCP appointment scheduled for: [id number]   Telephone follow up appointment with care management team member scheduled for:  [id number] Cecilie Coffee 1:30         Una Ganser BSN RN [facility] Care Management Coordinator [email or web address] Direct Dial: [phone]  Fax: [phone] Website: [email or web address]

## 2023-10-31 DIAGNOSIS — N390 Urinary tract infection, site not specified: Secondary | ICD-10-CM | POA: Diagnosis not present

## 2023-10-31 DIAGNOSIS — Z5181 Encounter for therapeutic drug level monitoring: Secondary | ICD-10-CM | POA: Diagnosis not present

## 2023-10-31 DIAGNOSIS — R112 Nausea with vomiting, unspecified: Secondary | ICD-10-CM | POA: Diagnosis not present

## 2023-10-31 DIAGNOSIS — Z944 Liver transplant status: Secondary | ICD-10-CM | POA: Diagnosis not present

## 2023-10-31 DIAGNOSIS — D849 Immunodeficiency, unspecified: Secondary | ICD-10-CM | POA: Diagnosis not present

## 2023-11-01 DIAGNOSIS — Z944 Liver transplant status: Secondary | ICD-10-CM | POA: Diagnosis not present

## 2023-11-01 DIAGNOSIS — D849 Immunodeficiency, unspecified: Secondary | ICD-10-CM | POA: Diagnosis not present

## 2023-11-01 DIAGNOSIS — Z87440 Personal history of urinary (tract) infections: Secondary | ICD-10-CM | POA: Diagnosis not present

## 2023-11-05 ENCOUNTER — Other Ambulatory Visit: Payer: Self-pay | Admitting: *Deleted

## 2023-11-05 NOTE — Patient Instructions (Signed)
 Visit Information  Thank you for taking time to visit with me today. Please don't hesitate to contact me if I can be of assistance to you before our next scheduled telephone appointment.  Our next appointment is by telephone on 11/12/23 at 1015 am  Following is a copy of your care plan:   Goals Addressed             This Visit's Progress    [facility] Transitions of Care (TOC) Care Plan       Problems:  Recent Hospitalization for treatment of CKD Stage 3A and Urinary tract infection Diet/Nutrition/Food Resources Patient states since medication cellcept  was increased she has nausea at times, diarrhea maybe 1-2 times per day, joint pain, recurring infection, pt states she stays in close touch with [facility] and reported these side effects and let them know this increase in medication is not working well for her, she is awaiting call back from [place].  Pt denies pain today, denies signs/ symptoms of UTI  Goal:  Over the next 30 days, the patient will not experience hospital readmission  Interventions:    Chronic Kidney Disease Interventions: Evaluation of current treatment plan related to chronic kidney disease self management and patient's adherence to plan as established by provider      Reviewed medications with patient and discussed importance of compliance    Reviewed scheduled/upcoming provider appointments including   primary care provider 11/07/23 Last practice recorded BP readings:  BP Readings from Last 3 Encounters:  10/26/23 (!) 151/82  10/23/23 101/62  10/17/23 (!) 148/94  Reviewed signs / symptoms of infection/ UTI Reviewed importance of continuing to stay in close contact with [facility] Transplant team, continue reporting any unwanted side effects of medication, change in health status/ symptoms Most recent eGFR/CrCl: No results found for: "EGFR"  No components found for: "CRCL"  Patient Self Care Activities:        Increase fluid intake Attend all scheduled provider appointments Call  pharmacy for medication refills 3-7 days in advance of running out of medications Call provider office for new concerns or questions  Notify RN Care Manager of TOC call rescheduling needs Participate in Transition of Care Program/Attend TOC scheduled calls Perform all self care activities independently  Perform IADL's (shopping, preparing meals, housekeeping, managing finances) independently Take medications as prescribed   Please continue to stay in close contact with [facility] transplant team  Plan:  Next PCP appointment scheduled for: [id number]   Telephone follow up appointment with care management team member scheduled for:  11/12/23 @ 1015 am        Patient verbalizes understanding of instructions and care plan provided today and agrees to view in MyChart. Active MyChart status and patient understanding of how to access instructions and care plan via MyChart confirmed with patient.     Telephone follow up appointment with care management team member scheduled for: 11/12/23 @ 1015 am  Please call the care guide team at [phone] if you need to cancel or reschedule your appointment.   Please call the Suicide and Crisis Lifeline: 988 call the [place]  National Suicide Prevention Lifeline: 706 291 0432 or TTY: 530-579-3913 TTY (610) 211-6925) to talk to a trained counselor call 1-800-273-TALK (toll free, 24 hour hotline) go to [facility] [address] [place] [phone]) call 911 if you are experiencing a Mental Health or Behavioral Health Crisis or need someone to talk to.  Cecilie Coffee RNC, BSN RN Care Manager/ Transition of Care [place]/ [facility] Population  Health [phone]

## 2023-11-05 NOTE — Transitions of Care (Post Inpatient/ED Visit) (Signed)
 Transition of Care week 2  Visit Note  11/05/2023  Name: Cassandra [name] MRN: [id number]          DOB: Oct 30, 1960  Situation: Patient enrolled in [facility] 30-day program. Visit completed with patient by telephone.   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    Ruptured disc, cervical    multiple levels   Statin intolerance 05/13/2018    Assessment: Patient Reported Symptoms: Cognitive Cognitive Status: Able to follow simple commands, Alert and oriented to person, place, and time, Normal speech and language skills   Healing Pattern: Unsure Health Facilitated by: Rest, Healthy diet  Neurological Neurological Review of Symptoms: No symptoms reported    HEENT HEENT Symptoms Reported: No symptoms reported      Cardiovascular Cardiovascular Symptoms Reported: No symptoms reported Does patient have uncontrolled Hypertension?: No Cardiovascular Conditions: Hypertension Cardiovascular Management Strategies: Routine screening, Medication therapy Cardiovascular Self-Management Outcome: 4 (good) Cardiovascular Comment: pt states she checks blood pressure at home, pt is resting and does not have BP log with her  Respiratory Respiratory Symptoms Reported: No symptoms reported    Endocrine Patient reports the following symptoms related to hypoglycemia or hyperglycemia : No symptoms reported Is patient diabetic?: Yes Is  patient checking blood sugars at home?: No Endocrine Conditions: Diabetes Endocrine Management Strategies: Diet modification Endocrine Self-Management Outcome: 4 (good) Endocrine Comment: does not check CBG, diabetes is diet controlled  Gastrointestinal Gastrointestinal Symptoms Reported: Diarrhea Other Gastrointestinal Symptoms: decreased appetite Additional Gastrointestinal Details: diarrhea 1-2 x per day, pt states related to side effects of cellcept  (increased dosage), she reported to [facility] Transplant team and awaiting call back Gastrointestinal Conditions: Diarrhea Gastrointestinal Management Strategies: Adequate rest Gastrointestinal Self-Management Outcome: 3 (uncertain) Gastrointestinal Comment: pt is eating frequent small meals throughout the day Nutrition Risk Screen (CP): No indicators present  Genitourinary Genitourinary Symptoms Reported: No symptoms reported Additional Genitourinary Details: denies signs/ symptoms of UTI    Integumentary Integumentary Symptoms Reported: No symptoms reported    Musculoskeletal Musculoskelatal Symptoms Reviewed: Other Other Musculoskeletal Symptoms: joint pain Additional Musculoskeletal Details: pt feels joint pain is side effect of increased dosage of cellcept , [facility] Transplant team aware per pt Musculoskeletal Management Strategies: Adequate rest, Routine screening Musculoskeletal Self-Management Outcome: 3 (uncertain)      Psychosocial Psychosocial Symptoms Reported: No symptoms reported         There were no vitals filed for this visit.  Medications Reviewed Today     Reviewed by Daralyn Earl, RN (Registered Nurse) on 11/05/23 at 1317  Med List Status: <None>   Medication Order Taking? Sig Documenting Provider Last Dose Status Informant  albuterol  (VENTOLIN  HFA) 108 (90 Base) MCG/ACT inhaler [id number] No Inhale 2 puffs into the lungs every 6 (six) hours as needed for wheezing or shortness of breath.  Patient not taking:  Reported  on 10/23/2023   Christel Cousins, MD Not Taking Active Self  bacitracin -polymyxin b  (POLYSPORIN ) ophthalmic ointment [id number] No Place 1 Application into both eyes 2 (two) times daily. apply to eye every 12 hours while awake Zorita Hiss, NP Taking Active Self  diclofenac  Sodium (VOLTAREN ) 1 % GEL [id number] No Apply 4 g topically 4 (four) times daily as needed. Luevenia Saha, MD Taking Active Self  fluticasone  (FLONASE ) 50 MCG/ACT nasal spray [id number] No SHAKE LIQUID AND USE 2 SPRAYS IN EACH NOSTRIL DAILY  Patient taking differently: Place 2 sprays into both nostrils daily as needed for allergies.   Luevenia Saha, MD Taking Active Self  levocetirizine (XYZAL ) 5 MG tablet [id number] No Take 1 tablet (5 mg total) by mouth every evening.  Patient taking differently: Take 5 mg by mouth daily as needed for allergies.   Buena Carmine, NP Taking Active Self  magnesium  oxide (MAG-OX) 400 MG tablet [id number] No Take 400 mg by mouth every evening. [provider] Taking Active Self  mycophenolate  (CELLCEPT ) 500 MG tablet [id number] No Take 500 mg by mouth 2 (two) times daily. [provider] Taking Active Self  olopatadine  (PATANOL) 0.1 % ophthalmic solution [id number] No Place 1 drop into both eyes 2 (two) times daily.  Patient taking differently: Place 1 drop into both eyes 2 (two) times daily as needed for allergies.   Allwardt, Deleta Felix, PA-C Taking Active Self  Omega-3 Fatty Acids (FISH OIL ) 1000 MG CAPS [id number] No Take 1 capsule (1,000 mg total) by mouth daily. Luevenia Saha, MD Taking Active Self  predniSONE  (DELTASONE ) 5 MG tablet [id number] No Take 5 mg by mouth daily with breakfast. [provider] Taking Active Self  promethazine  (PHENERGAN ) 25 MG tablet [id number] No Take 1 tablet (25 mg total) by mouth every 6 (six) hours as needed for nausea or vomiting. Angelyn Kennel M, PA-C Taking Active Self  tacrolimus  (PROGRAF ) 1 MG capsule [id number] No Take 2 capsules  (2 mg total) by mouth 2 (two) times daily. Luevenia Saha, MD Taking Active Self  tetrahydrozoline (EYE DROPS) 0.05 % ophthalmic solution [id number] No Place 1 drop into both eyes 2 (two) times daily as needed. [provider] Taking Active Self  triamcinolone  acetonide (KENALOG -40) injection 40 mg [id number]   Andy, Camille L, MD  Active             Goals Addressed             This Visit's Progress    [facility] Transitions of Care (TOC) Care Plan       Problems:  Recent Hospitalization for treatment of CKD Stage 3A and Urinary tract infection Diet/Nutrition/Food Resources Patient states since medication cellcept  was increased she has nausea at times, diarrhea maybe 1-2 times per day, joint pain, recurring infection, pt states she stays in close touch with [facility] and reported these side effects and let them know this increase in medication is not working well for her, she is awaiting call back from [place].  Pt denies pain today, denies signs/ symptoms of UTI  Goal:  Over the next 30 days, the patient will not experience hospital readmission  Interventions:    Chronic Kidney Disease Interventions: Evaluation of current treatment plan related to chronic kidney disease self management and patient's adherence to plan as established by provider      Reviewed medications with patient and discussed importance of compliance    Reviewed scheduled/upcoming provider appointments including   primary  care provider 11/07/23 Last practice recorded BP readings:  BP Readings from Last 3 Encounters:  10/26/23 (!) 151/82  10/23/23 101/62  10/17/23 (!) 148/94  Reviewed signs / symptoms of infection/ UTI Reviewed importance of continuing to stay in close contact with [facility] Transplant team, continue reporting any unwanted side effects of medication, change in health status/ symptoms Most recent eGFR/CrCl: No results found for: "EGFR"  No components found for: "CRCL"  Patient Self Care Activities:         Increase fluid intake Attend all scheduled provider appointments Call pharmacy for medication refills 3-7 days in advance of running out of medications Call provider office for new concerns or questions  Notify RN Care Manager of TOC call rescheduling needs Participate in Transition of Care Program/Attend TOC scheduled calls Perform all self care activities independently  Perform IADL's (shopping, preparing meals, housekeeping, managing finances) independently Take medications as prescribed   Please continue to stay in close contact with [facility] transplant team  Plan:  Next PCP appointment scheduled for: [id number]   Telephone follow up appointment with care management team member scheduled for:  11/12/23 @ 1015 am        Recommendation:   PCP Follow-up 11/07/23 @ 9am Follow up with [facility] Transplant Team  Follow Up Plan:   Telephone follow-up 11/12/23 @ 1015 am  Cecilie Coffee [facility], BSN RN Care Manager/ Transition of Care [place]/ [facility] Population Health [phone]

## 2023-11-07 ENCOUNTER — Ambulatory Visit (INDEPENDENT_AMBULATORY_CARE_PROVIDER_SITE_OTHER): Admitting: Family Medicine

## 2023-11-07 VITALS — BP 106/74 | HR 100 | Temp 97.7°F | Ht 65.0 in | Wt 178.8 lb

## 2023-11-07 DIAGNOSIS — D849 Immunodeficiency, unspecified: Secondary | ICD-10-CM | POA: Diagnosis not present

## 2023-11-07 DIAGNOSIS — Z944 Liver transplant status: Secondary | ICD-10-CM | POA: Diagnosis not present

## 2023-11-07 DIAGNOSIS — E119 Type 2 diabetes mellitus without complications: Secondary | ICD-10-CM

## 2023-11-07 DIAGNOSIS — N1831 Chronic kidney disease, stage 3a: Secondary | ICD-10-CM | POA: Diagnosis not present

## 2023-11-07 DIAGNOSIS — Z51A Encounter for sepsis aftercare: Secondary | ICD-10-CM | POA: Diagnosis not present

## 2023-11-07 DIAGNOSIS — A419 Sepsis, unspecified organism: Secondary | ICD-10-CM

## 2023-11-07 DIAGNOSIS — D72825 Bandemia: Secondary | ICD-10-CM

## 2023-11-07 LAB — CBC WITH DIFFERENTIAL/PLATELET
Basophils Absolute: 0 10*3/uL (ref 0.0–0.1)
Basophils Relative: 0.5 % (ref 0.0–3.0)
Eosinophils Absolute: 0.2 10*3/uL (ref 0.0–0.7)
Eosinophils Relative: 2.4 % (ref 0.0–5.0)
HCT: 40.2 % (ref 36.0–46.0)
Hemoglobin: 13.2 g/dL (ref 12.0–15.0)
Lymphocytes Relative: 39.7 % (ref 12.0–46.0)
Lymphs Abs: 2.6 10*3/uL (ref 0.7–4.0)
MCHC: 32.8 g/dL (ref 30.0–36.0)
MCV: 85.4 fl (ref 78.0–100.0)
Monocytes Absolute: 0.8 10*3/uL (ref 0.1–1.0)
Monocytes Relative: 11.9 % (ref 3.0–12.0)
Neutro Abs: 3 10*3/uL (ref 1.4–7.7)
Neutrophils Relative %: 45.5 % (ref 43.0–77.0)
Platelets: 252 10*3/uL (ref 150.0–400.0)
RBC: 4.7 Mil/uL (ref 3.87–5.11)
RDW: 14.2 % (ref 11.5–15.5)
WBC: 6.7 10*3/uL (ref 4.0–10.5)

## 2023-11-07 LAB — HEMOGLOBIN A1C: Hgb A1c MFr Bld: 6.5 % (ref 4.6–6.5)

## 2023-11-07 NOTE — Patient Instructions (Signed)
Please follow up if symptoms do not improve or as needed.   

## 2023-11-07 NOTE — Progress Notes (Signed)
 Subjective  CC:  Chief Complaint  Patient presents with   Hospitalization Follow-up    10/23/2023 - 10/26/2023 (3 days) [facility]       HPI: Cassandra [name] is a 64 y.o. female who presents to the office today to address the problems listed above in the chief complaint. Discussed the use of AI scribe software for clinical note transcription with the patient, who gave verbal consent to proceed.  History of Present Illness Cassandra [name] is a 63 year old female with a history of liver transplant and immunosuppression who presents for hospital follow-up after treatment for sepsis.  Recently hospitalized for three days due to sepsis with an unclear source, treated with IV antibiotics, and leukocytosis of 22,000 resolved by discharge. Acute renal failure improved with IV fluids. No current urinary symptoms or abdominal pain. She did have n/v. She believes it was caused by her medication, cellcept .   History of medication issues related to liver transplant. Previously on cyclosporin, switched to mycophenolate  and CellCept  due to problems, but experienced dizziness and gastrointestinal issues, leading to discontinuation. Currently on prednisone  and Prograf . I reviewed notes from transplant team 5/29: changed to myfortic but pt stopped this as well due to side effects. [facility] is concerned about low grade rejection causing elevated LFTs. Pt is requesting 2nd opinion and referral.   Reports feeling weak and having a low appetite, which is improving. Experiences occasional dizziness upon standing, which is also improving. No palpitations or cp or sob or cough. No urinary sxs. No nv/d. Reports low grade fever yesterday.   Mentions a keloid on her abdomen that swelled and burst after using a heating pad. Healing is slow. Not taking any allergy medications.   Assessment  1. Sepsis, due to unspecified organism, unspecified whether acute organ dysfunction present (HCC)   2. Bandemia    3. Diet-controlled type 2 diabetes mellitus (HCC)   4. History of liver transplant (HCC)   5. Immunosuppressed status (HCC)   6. Chronic kidney disease (CKD) stage G3a/A1, moderately decreased glomerular filtration rate (GFR) between 45-59 mL/min/1.73 square meter and albuminuria creatinine ratio less than 30 mg/g (HCC)      Plan  Assessment and Plan Assessment & Plan Sepsis Recent sepsis treated with IV antibiotics. Asymptomatic but requires monitoring due to history of dizziness and fever. - Monitor for signs of infection. - Check blood work for post-hospitalization improvement.  Acute Renal Failure Acute renal failure improved with IV fluids. Requires monitoring. - Monitor renal function through blood tests.  Hyponatremia and Hypokalemia Hyponatremia and hypokalemia noted during hospitalization. Requires follow-up. - Follow up on electrolyte levels.  Liver Transplant Management Complications from immunosuppressive medications. Discontinued mycophenolate  due to adverse effects. Stopped myfortic. Requires careful management and seeks second opinion. - Refer to a new gastroenterologist or hepatologist for transplant management. - Consider referral to [facility] or [facility] for specialist care.  Keloid Keloid on abdomen, oozing and not healing well. Requires monitoring for infection or worsening. - Keep keloid covered. - Apply Vaseline to aid healing. - Monitor for redness or drainage and report if occurs.  Diet controlled diabetes due to chronic pred: check A1c today  Follow up: as scheduled. Orders Placed This Encounter  Procedures   CBC with Differential/Platelet   Comprehensive metabolic panel with GFR   Hemoglobin A1c   No orders of the defined types were placed in this encounter.    I reviewed the patients updated PMH, FH, and SocHx.  Patient Active Problem  List   Diagnosis Date Noted   Lumbar spinal stenosis 05/10/2022    Priority: High   Diet-controlled type 2  diabetes mellitus (HCC) 08/28/2021    Priority: High   Chronic kidney disease (CKD) stage G3a/A1, moderately decreased glomerular filtration rate (GFR) between 45-59 mL/min/1.73 square meter and albuminuria creatinine ratio less than 30 mg/g (HCC) 03/01/2021    Priority: High   Statin myopathy 05/13/2018    Priority: High   Primary insomnia 11/04/2017    Priority: High   Immunosuppressed status (HCC) 10/07/2017    Priority: High   Adenomatous polyp of colon 09/28/2017    Priority: High   Combined hyperlipidemia associated with type 2 diabetes mellitus (HCC) 03/10/2013    Priority: High   Primary osteoarthritis of knees, bilateral 03/01/2013    Priority: High   Chronic back pain 10/13/2012    Priority: High   History of liver transplant (HCC) 02/22/2009    Priority: High   Migraine with aura and without status migrainosus, not intractable 07/03/2018    Priority: Medium    Cervical facet joint syndrome 03/28/2018    Priority: Medium    Spondylosis of cervical region without myelopathy or radiculopathy 03/28/2018    Priority: Medium    Lumbar facet arthropathy 12/27/2017    Priority: Medium    Cervical stenosis of spine 01/25/2017    Priority: Medium    Fibromyalgia 08/24/2013    Priority: Medium    Hx of tuberculosis 10/13/2012    Priority: Medium    Anxiety disorder 09/16/2007    Priority: Medium    AS (sickle cell trait) (HCC) 06/29/2019    Priority: Low   Sepsis (HCC) 10/24/2023   Screening for osteoporosis 05/15/2023   Current Meds  Medication Sig   magnesium  oxide (MAG-OX) 400 MG tablet Take 400 mg by mouth every evening.   Omega-3 Fatty Acids (FISH OIL ) 1000 MG CAPS Take 1 capsule (1,000 mg total) by mouth daily.   predniSONE  (DELTASONE ) 5 MG tablet Take 5 mg by mouth daily with breakfast.   tacrolimus  (PROGRAF ) 1 MG capsule Take 2 capsules (2 mg total) by mouth 2 (two) times daily.   Allergies: Patient is allergic to erythromycin, ezetimibe, other, rofecoxib,  statins, zonisamide, carvacrol, doxycycline , elavil  [amitriptyline ], metoprolol , and nsaids. Family History: Patient family history includes Alzheimer's disease in her mother; Cancer in her sister; Dementia in her mother; Diabetes in her brother and mother; Gout in her mother; Heart disease in her sister; Hypertension in her mother and sister; Stroke in her mother. Social History:  Patient  reports that she has never smoked. She has never used smokeless tobacco. She reports that she does not drink alcohol and does not use drugs.  Review of Systems: Constitutional: Negative for fever malaise or anorexia Cardiovascular: negative for chest pain Respiratory: negative for SOB or persistent cough Gastrointestinal: negative for abdominal pain  Objective  Vitals: BP 106/74   Pulse 100   Temp 97.7 F (36.5 C)   Ht 5\' 5"  (1.651 m)   Wt 178 lb 12.8 oz (81.1 kg)   SpO2 98%   BMI 29.75 kg/m  General: no acute distress , A&Ox3 HEENT: PEERL, conjunctiva normal, neck is supple Cardiovascular:  RRR without murmur or gallop.  Respiratory:  Good breath sounds bilaterally, CTAB with normal respiratory effort Abdomen: soft, nontender. 2x3cm open wound on right upper abdomen with granulation tissue. No drainage.  Skin:  Warm, no rashes Commons side effects, risks, benefits, and alternatives for medications and treatment plan prescribed  today were discussed, and the patient expressed understanding of the given instructions. Patient is instructed to call or message via MyChart if he/she has any questions or concerns regarding our treatment plan. No barriers to understanding were identified. We discussed Red Flag symptoms and signs in detail. Patient expressed understanding regarding what to do in case of urgent or emergency type symptoms.  Medication list was reconciled, printed and provided to the patient in AVS. Patient instructions and summary information was reviewed with the patient as documented in the  AVS. This note was prepared with assistance of Dragon voice recognition software. Occasional wrong-word or sound-a-like substitutions may have occurred due to the inherent limitations of voice recognition software

## 2023-11-08 LAB — COMPREHENSIVE METABOLIC PANEL WITH GFR
ALT: 35 U/L (ref 0–35)
AST: 33 U/L (ref 0–37)
Albumin: 3.7 g/dL (ref 3.5–5.2)
Alkaline Phosphatase: 169 U/L — ABNORMAL HIGH (ref 39–117)
BUN: 21 mg/dL (ref 6–23)
CO2: 21 meq/L (ref 19–32)
Calcium: 9.4 mg/dL (ref 8.4–10.5)
Chloride: 103 meq/L (ref 96–112)
Creatinine, Ser: 1.18 mg/dL (ref 0.40–1.20)
GFR: 49.17 mL/min — ABNORMAL LOW (ref >60.00)
Glucose, Bld: 111 mg/dL — ABNORMAL HIGH (ref 70–99)
Potassium: 3.8 meq/L (ref 3.5–5.1)
Sodium: 137 meq/L (ref 135–145)
Total Bilirubin: 0.6 mg/dL (ref 0.2–1.2)
Total Protein: 7.6 g/dL (ref 6.0–8.3)

## 2023-11-10 ENCOUNTER — Ambulatory Visit: Payer: Self-pay | Admitting: Family Medicine

## 2023-11-10 NOTE — Progress Notes (Signed)
 See mychart note Dear Ms. Offenberger, Your lab results all have improved. Hope you are recovering well. Sincerely, Dr. Jonelle Neri

## 2023-11-12 ENCOUNTER — Other Ambulatory Visit: Payer: Self-pay | Admitting: *Deleted

## 2023-11-12 NOTE — Patient Instructions (Signed)
 Visit Information  Thank you for taking time to visit with me today. Please don't hesitate to contact me if I can be of assistance to you before our next scheduled telephone appointment.  Our next appointment is by telephone on 11/19/23 @ 115 pm  Following is a copy of your care plan:   Goals Addressed             This Visit's Progress    VBCI Transitions of Care (TOC) Care Plan       Problems:  Recent Hospitalization for treatment of CKD Stage 3A and Urinary tract infection Diet/Nutrition/Food Resources Patient states since medication cellcept  was increased she has nausea at times, diarrhea maybe 1-2 times per day, joint pain, recurring infection, pt states she stays in close touch with [facility] and reported these side effects and let them know this increase in medication is not working well for her, she is awaiting call back from [place].  Pt denies pain today, denies signs/ symptoms of UTI 11/12/23- Pt states she is only taking prograf  and prednisone , states she is not taking cellcept  and nausea, diarrhea, joint pain has resolved, she discussed with primary care provider and referral in process for new GI and new hepatologist for [facility] or [facility], pt states she is having bloodwork and urinalysis at [facility] this week to be sent to [facility] and will follow up with them.  Pt saw primary care provider on 11/07/23.  Goal:  Over the next 30 days, the patient will not experience hospital readmission  Interventions:    Chronic Kidney Disease Interventions: Evaluation of current treatment plan related to chronic kidney disease self management and patient's adherence to plan as established by provider      Reviewed medications with patient and discussed importance of compliance    Reviewed scheduled/upcoming provider appointments including   primary care provider 11/07/23 Last practice recorded BP readings:  BP Readings from Last 3 Encounters:  10/26/23 (!) 151/82  10/23/23 101/62  10/17/23 (!) 148/94   Reviewed signs / symptoms of infection/ UTI Reinforced importance of continuing to stay in close contact with [facility] Transplant team, continue reporting any unwanted side effects of medication, change in health status/ symptoms Most recent eGFR/CrCl: No results found for: "EGFR"  No components found for: "CRCL" Reviewed AVS from primary care provider visit  Patient Self Care Activities:        Increase fluid intake Attend all scheduled provider appointments Call pharmacy for medication refills 3-7 days in advance of running out of medications Call provider office for new concerns or questions  Notify RN Care Manager of TOC call rescheduling needs Participate in Transition of Care Program/Attend TOC scheduled calls Perform all self care activities independently  Perform IADL's (shopping, preparing meals, housekeeping, managing finances) independently Take medications as prescribed   Please continue to stay in close contact with [facility] transplant team, complete bloodwork and urinalysis this week  Plan:    Telephone follow up appointment with care management team member scheduled for:  11/19/23 @ 115 pm with Orpha Blade RN        Patient verbalizes understanding of instructions and care plan provided today and agrees to view in MyChart. Active MyChart status and patient understanding of how to access instructions and care plan via MyChart confirmed with patient.     Telephone follow up appointment with care management team member scheduled for: 11/19/23 @ 115 pm  Please call the care guide team at [phone] if you need to cancel or  reschedule your appointment.   Please call the Suicide and Crisis Lifeline: 988 call the [place]  National Suicide Prevention Lifeline: (626)758-6531 or TTY: 936 848 9980 TTY 901-413-3582) to talk to a trained counselor call 1-800-273-TALK (toll free, 24 hour hotline) go to [facility] [address] [place]  [phone]) call 911 if you are experiencing a Mental Health or Behavioral Health Crisis or need someone to talk to.  Cecilie Coffee [facility], BSN RN Care Manager/ Transition of Care [place]/ [facility] [phone]

## 2023-11-12 NOTE — Transitions of Care (Post Inpatient/ED Visit) (Signed)
 Transition of Care week 3  Visit Note  11/12/2023  Name: Cassandra [name] MRN: [id number]          DOB: [birth year]  Situation: Patient enrolled in [facility] 30-day program. Visit completed with patient by telephone.   Background:   Past Medical History:  Diagnosis Date   Anxiety    AS (sickle cell trait) (HCC) 06/29/2019   Autoimmune hepatitis (HCC)    Chronic renal insufficiency    Cirrhosis (HCC)    COLONIC POLYPS, ADENOMATOUS, HX OF 05/05/2008   Qualifier: Diagnosis of  By: [name], [name]     Depression    External hemorrhoids    Fibromyalgia    GERD (gastroesophageal reflux disease)    Hyperlipidemia    Internal hemorrhoids    Iron deficiency anemia    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   Qualifier: Diagnosis of  By: Daphane Dynes NP, Maureen Sour     Migraine    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    Ruptured disc, cervical    multiple levels   Statin intolerance 05/13/2018    Assessment: Patient Reported Symptoms: Cognitive Cognitive Status: Able to follow simple commands, Alert and oriented to person, place, and time, Normal speech and language skills      Neurological Neurological Review of Symptoms: No symptoms reported    HEENT HEENT Symptoms Reported: No symptoms reported      Cardiovascular Cardiovascular Symptoms Reported: No symptoms reported Does patient have uncontrolled Hypertension?: No    Respiratory Respiratory Symptoms Reported: No symptoms reported    Endocrine Patient reports the following symptoms related to hypoglycemia or hyperglycemia : No symptoms reported Is patient diabetic?: Yes Is patient checking blood sugars at home?: No Endocrine Conditions: Diabetes Endocrine Management Strategies: Diet modification, Medication therapy Endocrine Self-Management Outcome: 4 (good) Endocrine Comment: does not check CBG, diet controlled  Gastrointestinal Gastrointestinal Symptoms Reported: No symptoms reported Other Gastrointestinal  Symptoms: diarrhea has resolved      Genitourinary Genitourinary Symptoms Reported: No symptoms reported Additional Genitourinary Details: denies signs/ symptoms of UTI    Integumentary Integumentary Symptoms Reported: No symptoms reported    Musculoskeletal Musculoskelatal Symptoms Reviewed: No symptoms reported Other Musculoskeletal Symptoms: states joint pain has resolved        Psychosocial Psychosocial Symptoms Reported: No symptoms reported         There were no vitals filed for this visit.  Medications Reviewed Today     Reviewed by Daralyn Earl, RN (Registered Nurse) on 11/12/23 at 1050  Med List Status: <None>   Medication Order Taking? Sig Documenting Provider Last Dose Status Informant  albuterol  (VENTOLIN  HFA) 108 (90 Base) MCG/ACT inhaler [id number] Yes Inhale 2 puffs into the lungs every 6 (six) hours as needed for wheezing or shortness of breath. Christel Cousins, MD Taking Active Self  diclofenac  Sodium (VOLTAREN ) 1 % GEL [id number] No Apply 4 g topically 4 (four) times daily as needed.  Patient not taking: Reported on 11/07/2023   Luevenia Saha, MD Not Taking Active Self  fluticasone  (FLONASE ) 50 MCG/ACT nasal spray [id number] Yes SHAKE LIQUID AND USE 2 SPRAYS IN EACH NOSTRIL DAILY Luevenia Saha, MD Taking Active Self  levocetirizine (XYZAL ) 5 MG tablet [id number] Yes Take 1 tablet (5 mg total) by mouth every evening. Buena Carmine, NP Taking Active Self  magnesium  oxide (MAG-OX) 400 MG tablet [id number] Yes Take 400 mg by mouth every evening. [provider] Taking Active Self  olopatadine  (PATANOL) 0.1 % ophthalmic solution [id number]  Yes Place 1 drop into both eyes 2 (two) times daily. Allwardt, Deleta Felix, PA-C Taking Active Self  Omega-3 Fatty Acids (FISH OIL ) 1000 MG CAPS [id number] Yes Take 1 capsule (1,000 mg total) by mouth daily. Luevenia Saha, MD Taking Active Self  predniSONE  (DELTASONE ) 5 MG tablet [id number] Yes Take 5 mg by mouth daily with  breakfast. [provider] Taking Active Self  tacrolimus  (PROGRAF ) 1 MG capsule [id number] Yes Take 2 capsules (2 mg total) by mouth 2 (two) times daily. Luevenia Saha, MD Taking Active Self            Goals Addressed             This Visit's Progress    VBCI Transitions of Care (TOC) Care Plan       Problems:  Recent Hospitalization for treatment of CKD Stage 3A and Urinary tract infection Diet/Nutrition/Food Resources Patient states since medication cellcept  was increased she has nausea at times, diarrhea maybe 1-2 times per day, joint pain, recurring infection, pt states she stays in close touch with [facility] and reported these side effects and let them know this increase in medication is not working well for her, she is awaiting call back from [place].  Pt denies pain today, denies signs/ symptoms of UTI 11/12/23- Pt states she is only taking prograf  and prednisone , states she is not taking cellcept  and nausea, diarrhea, joint pain has resolved, she discussed with primary care provider and referral in process for new GI and new hepatologist for [facility] or [facility], pt states she is having bloodwork and urinalysis [facility] this week to be sent to [facility] and will follow up with them.  Pt saw primary care provider on 11/07/23.  Goal:  Over the next 30 days, the patient will not experience hospital readmission  Interventions:    Chronic Kidney Disease Interventions: Evaluation of current treatment plan related to chronic kidney disease self management and patient's adherence to plan as established by provider      Reviewed medications with patient and discussed importance of compliance    Reviewed scheduled/upcoming provider appointments including   primary care provider 11/07/23 Last practice recorded BP readings:  BP Readings from Last 3 Encounters:  10/26/23 (!) 151/82  10/23/23 101/62  10/17/23 (!) 148/94  Reviewed signs / symptoms of infection/ UTI Reinforced importance of  continuing to stay in close contact with [facility] Transplant team, continue reporting any unwanted side effects of medication, change in health status/ symptoms Most recent eGFR/CrCl: No results found for: "EGFR"  No components found for: "CRCL" Reviewed AVS from primary care provider visit  Patient Self Care Activities:        Increase fluid intake Attend all scheduled provider appointments Call pharmacy for medication refills 3-7 days in advance of running out of medications Call provider office for new concerns or questions  Notify RN Care Manager of TOC call rescheduling needs Participate in Transition of Care Program/Attend TOC scheduled calls Perform all self care activities independently  Perform IADL's (shopping, preparing meals, housekeeping, managing finances) independently Take medications as prescribed   Please continue to stay in close contact with [facility] transplant team, complete bloodwork and urinalysis this week  Plan:    Telephone follow up appointment with care management team member scheduled for:  11/19/23 @ 115 pm with Orpha Blade RN        Recommendation:   PCP Follow-up  Follow Up Plan:   Telephone follow-up 11/19/23 @ 115 pm  Concha Deed  Anniece Kind, BSN RN Care Manager/ Transition of Care [place]/ [facility] [phone]

## 2023-11-13 DIAGNOSIS — D849 Immunodeficiency, unspecified: Secondary | ICD-10-CM | POA: Diagnosis not present

## 2023-11-13 DIAGNOSIS — Z944 Liver transplant status: Secondary | ICD-10-CM | POA: Diagnosis not present

## 2023-11-19 ENCOUNTER — Telehealth: Payer: Self-pay

## 2023-11-19 NOTE — Patient Instructions (Signed)
 Visit Information  Thank you for taking time to visit with me today. Please don't hesitate to contact me if I can be of assistance to you before our next scheduled telephone appointment.  Our next appointment is by telephone on 11/28/23 at 1:15pm  Following is a copy of your care plan:   Goals Addressed             This Visit's Progress    VBCI Transitions of Care (TOC) Care Plan       Problems:  Recent Hospitalization for treatment of CKD Stage 3A and Urinary tract infection Diet/Nutrition/Food Resources Patient states since medication cellcept  was increased she has nausea at times, diarrhea maybe 1-2 times per day, joint pain, recurring infection, pt states she stays in close touch with [facility] and reported these side effects and let them know this increase in medication is not working well for her, she is awaiting call back from [place].  Pt denies pain today, denies signs/ symptoms of UTI 11/12/23- Pt states she is only taking prograf  and prednisone , states she is not taking cellcept  and nausea, diarrhea, joint pain has resolved, she discussed with primary care provider and referral in process for new GI and new hepatologist for [facility] or [facility], pt states she is having bloodwork and urinalysis at [facility] this week to be sent to [facility] and will follow up with them.  Pt saw primary care provider on 11/07/23. Update 11/19/23: Patient states she has not yet heard from the referrals and we discussed calling her PCP office and talking to referral coordinator - patient will call if she does not hear anything by the end of the week   Goal:  Over the next 30 days, the patient will not experience hospital readmission  Interventions:    Chronic Kidney Disease Interventions: Evaluation of current treatment plan related to chronic kidney disease self management and patient's adherence to plan as established by provider      Reviewed medications with patient and discussed importance of compliance    Reviewed  scheduled/upcoming provider appointments including   primary care provider 11/07/23 Last practice recorded BP readings:  BP Readings from Last 3 Encounters:  10/26/23 (!) 151/82  10/23/23 101/62  10/17/23 (!) 148/94  Reviewed signs / symptoms of infection/ UTI Reinforced importance of continuing to stay in close contact with [facility] Transplant team, continue reporting any unwanted side effects of medication, change in health status/ symptoms Most recent eGFR/CrCl: No results found for: EGFR  No components found for: CRCL Reviewed AVS from primary care provider visit  Patient Self Care Activities:        Increase fluid intake Attend all scheduled provider appointments Call pharmacy for medication refills 3-7 days in advance of running out of medications Call provider office for new concerns or questions  Notify RN Care Manager of TOC call rescheduling needs Participate in Transition of Care Program/Attend TOC scheduled calls Perform all self care activities independently  Perform IADL's (shopping, preparing meals, housekeeping, managing finances) independently Take medications as prescribed   Please continue to stay in close contact with [facility] transplant team, complete bloodwork and urinalysis this week  Plan:    Telephone follow up appointment with care management team member scheduled for:  11/28/23 1:15pm with primary TOC RN Orpha Blade - plan for [facility] program closure         Patient verbalizes understanding of instructions and care plan provided today and agrees to view in MyChart. Active MyChart status and patient understanding  of how to access instructions and care plan via MyChart confirmed with patient.     Telephone follow up appointment with care management team member scheduled for:11/28/23 1:15pm The patient has been provided with contact information for the care management team and has been advised to call with any health related questions or concerns.   Please call the care  guide team at [phone] if you need to cancel or reschedule your appointment.   Please call the Suicide and Crisis Lifeline: 988 call 1-800-273-TALK (toll free, 24 hour hotline) call 911 if you are experiencing a Mental Health or Behavioral Health Crisis or need someone to talk to.  Tonia Frankel RN, CCM [place]  VBCI-Population Health RN Care Manager [phone]

## 2023-11-19 NOTE — Transitions of Care (Post Inpatient/ED Visit) (Signed)
 Transition of Care week 4  Visit Note  11/19/2023  Name: Cassandra [name] MRN: [id number]          DOB: Jan 02, 1961  Situation: Patient enrolled in [facility] 30-day program. Visit completed with patient by telephone.   Background:   Initial Transition Care Management Follow-up Telephone Call    Past Medical History:  Diagnosis Date   Anxiety    AS (sickle cell trait) (HCC) 06/29/2019   Autoimmune hepatitis (HCC)    Chronic renal insufficiency    Cirrhosis (HCC)    COLONIC POLYPS, ADENOMATOUS, HX OF 05/05/2008   Qualifier: Diagnosis of  By: [name], [name]     Depression    External hemorrhoids    Fibromyalgia    GERD (gastroesophageal reflux disease)    Hyperlipidemia    Internal hemorrhoids    Iron deficiency anemia    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   Qualifier: Diagnosis of  By: Daphane Dynes NP, Maureen Sour     Migraine    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    Ruptured disc, cervical    multiple levels   Statin intolerance 05/13/2018    Assessment: Patient Reported Symptoms: Cognitive Cognitive Status: Alert and oriented to person, place, and time, Normal speech and language skills Cognitive/Intellectual Conditions Management [RPT]: None reported or documented in medical history or problem list      Neurological Neurological Review of Symptoms: No symptoms reported    HEENT HEENT Symptoms Reported: No symptoms reported HEENT Comment: Patient reports having allergies but not too bothered at this time - also reports history of deviated septum and reports history of surgery    Cardiovascular Cardiovascular Symptoms Reported: No symptoms reported Weight: 178 lb (80.7 kg) Cardiovascular Comment: patient states she has not checked BP today and did not check it yesterday  Respiratory Respiratory Symptoms Reported: No symptoms reported Respiratory Conditions: Seasonal allergies  Endocrine Patient reports the following symptoms related to hypoglycemia or  hyperglycemia : No symptoms reported Is patient diabetic?: Yes Is patient checking blood sugars at home?: No Endocrine Conditions: Diabetes Endocrine Comment: patient states she does her sugar - reviewed diet, patient is on Presnisone  Gastrointestinal Gastrointestinal Symptoms Reported: No symptoms reported Additional Gastrointestinal Details: Patient reports all symptoms have resolved - No nausea, vomiting, diarrhea-Patient states she communicates with [facility] Team weekly Gastrointestinal Comment: Patient states she is eating and following low carb diet and doing well    Genitourinary Genitourinary Symptoms Reported: No symptoms reported Additional Genitourinary Details: Patient denies any signs/symptoms of UTI Genitourinary Self-Management Outcome: 4 (good)  Integumentary Integumentary Symptoms Reported: No symptoms reported    Musculoskeletal Musculoskelatal Symptoms Reviewed: No symptoms reported Other Musculoskeletal Symptoms: Patient denied any pain today Musculoskeletal Self-Management Outcome: 4 (good) Falls in the past year?: Yes (patient reports 10/17/23 while vomiting, she passed out - fell - sustatined bruising denies any falls since then)    Psychosocial Psychosocial Symptoms Reported: No symptoms reported         There were no vitals filed for this visit.  Medications Reviewed Today     Reviewed by Sharmaine Dearth, RN (Registered Nurse) on 11/19/23 at 1326  Med List Status: <None>   Medication Order Taking? Sig Documenting Provider Last Dose Status Informant  albuterol  (VENTOLIN  HFA) 108 (90 Base) MCG/ACT inhaler [id number] Yes Inhale 2 puffs into the lungs every 6 (six) hours as needed for wheezing or shortness of breath. Christel Cousins, MD  Active Self  diclofenac  Sodium (VOLTAREN ) 1 % GEL  [id number] Yes Apply 4 g topically 4 (four) times daily as needed. Luevenia Saha, MD  Active Self  fluticasone  (FLONASE ) 50 MCG/ACT nasal spray [id number] Yes SHAKE  LIQUID AND USE 2 SPRAYS IN EACH NOSTRIL DAILY Luevenia Saha, MD  Active Self  levocetirizine (XYZAL ) 5 MG tablet [id number] Yes Take 1 tablet (5 mg total) by mouth every evening. Buena Carmine, NP  Active Self  magnesium  oxide (MAG-OX) 400 MG tablet [id number] Yes Take 400 mg by mouth every evening. [provider]  Active Self  olopatadine  (PATANOL) 0.1 % ophthalmic solution [id number] Yes Place 1 drop into both eyes 2 (two) times daily. Allwardt, Deleta Felix, PA-C  Active Self  Omega-3 Fatty Acids (FISH OIL ) 1000 MG CAPS [id number] Yes Take 1 capsule (1,000 mg total) by mouth daily. Luevenia Saha, MD  Active Self  predniSONE  (DELTASONE ) 5 MG tablet [id number] Yes Take 5 mg by mouth daily with breakfast. [provider]  Active Self  tacrolimus  (PROGRAF ) 1 MG capsule [id number] Yes Take 2 capsules (2 mg total) by mouth 2 (two) times daily. Luevenia Saha, MD  Active Self            Recommendation:   Continue Current Plan of Care  Follow Up Plan:   Telephone follow up appointment date/time:  11/28/23 1:15pm with primary TOC RN, Suan Elm RN, CCM Dunbar  VBCI-Population Health RN Care Manager [phone]

## 2023-11-21 DIAGNOSIS — D849 Immunodeficiency, unspecified: Secondary | ICD-10-CM | POA: Diagnosis not present

## 2023-11-21 DIAGNOSIS — Z944 Liver transplant status: Secondary | ICD-10-CM | POA: Diagnosis not present

## 2023-11-21 DIAGNOSIS — R300 Dysuria: Secondary | ICD-10-CM | POA: Diagnosis not present

## 2023-11-26 ENCOUNTER — Other Ambulatory Visit: Payer: Self-pay

## 2023-11-26 ENCOUNTER — Telehealth: Payer: Self-pay

## 2023-11-26 DIAGNOSIS — Z944 Liver transplant status: Secondary | ICD-10-CM

## 2023-11-26 NOTE — Telephone Encounter (Signed)
 Copied from CRM [id number]. Topic: Referral - Status >> Nov 26, 2023  9:27 AM Franky GRADE wrote: Reason for CRM: Patient is calling to see if there was an update on the referrals for gastroenterologist or hepatologist for transplant management. - Consider referral to [facility] or [facility] for specialist care that Dr.Andy was going to place during her last visit on 11/07/2023.

## 2023-11-28 ENCOUNTER — Telehealth: Payer: Self-pay | Admitting: *Deleted

## 2023-11-28 ENCOUNTER — Other Ambulatory Visit: Payer: Self-pay | Admitting: *Deleted

## 2023-11-28 ENCOUNTER — Ambulatory Visit (INDEPENDENT_AMBULATORY_CARE_PROVIDER_SITE_OTHER): Admitting: Family Medicine

## 2023-11-28 ENCOUNTER — Encounter: Payer: Self-pay | Admitting: Family Medicine

## 2023-11-28 VITALS — BP 110/74 | HR 110 | Temp 98.1°F | Ht 65.0 in | Wt 176.8 lb

## 2023-11-28 DIAGNOSIS — R0609 Other forms of dyspnea: Secondary | ICD-10-CM | POA: Diagnosis not present

## 2023-11-28 DIAGNOSIS — R509 Fever, unspecified: Secondary | ICD-10-CM | POA: Diagnosis not present

## 2023-11-28 DIAGNOSIS — N3090 Cystitis, unspecified without hematuria: Secondary | ICD-10-CM

## 2023-11-28 DIAGNOSIS — N390 Urinary tract infection, site not specified: Secondary | ICD-10-CM

## 2023-11-28 LAB — POC URINALSYSI DIPSTICK (AUTOMATED)
Bilirubin, UA: POSITIVE
Blood, UA: POSITIVE
Glucose, UA: NEGATIVE
Ketones, UA: POSITIVE
Nitrite, UA: NEGATIVE
Protein, UA: POSITIVE — AB
Spec Grav, UA: 1.02 (ref 1.010–1.025)
Urobilinogen, UA: 0.2 U/dL
pH, UA: 5 (ref 5.0–8.0)

## 2023-11-28 MED ORDER — CEFDINIR 300 MG PO CAPS
300.0000 mg | ORAL_CAPSULE | Freq: Two times a day (BID) | ORAL | 0 refills | Status: DC
Start: 2023-11-28 — End: 2024-01-27

## 2023-11-28 NOTE — Patient Instructions (Signed)
 Visit Information  Thank you for taking time to visit with me today. Please don't hesitate to contact me if I can be of assistance to you before our next scheduled telephone appointment.  Our next appointment is no further scheduled appointments.  - with TOC team, longitudinal case management will outreach you  Following is a copy of your care plan:   Goals Addressed             This Visit's Progress    COMPLETED: VBCI Transitions of Care (TOC) Care Plan       Problems:  Recent Hospitalization for treatment of CKD Stage 3A and Urinary tract infection Diet/Nutrition/Food Resources Patient states since medication cellcept  was increased she has nausea at times, diarrhea maybe 1-2 times per day, joint pain, recurring infection, pt states she stays in close touch with [facility] and reported these side effects and let them know this increase in medication is not working well for her, she is awaiting call back from [place].  Pt denies pain today, denies signs/ symptoms of UTI 11/12/23- Pt states she is only taking prograf  and prednisone , states she is not taking cellcept  and nausea, diarrhea, joint pain has resolved, she discussed with primary care provider and referral in process for new GI and new hepatologist for [facility] or [facility], pt states she is having bloodwork and urinalysis at [facility] this week to be sent to [facility] and will follow up with them.  Pt saw primary care provider on 11/07/23.  11/28/23- pt reports she saw primary care provider today and diagnosed with UTI although she has no symptoms of UTI, reports she has some shortness of breath, worse with exertion and the outside heat, having CXR tomorrow 6/27, pt agrees to transfer to longitudinal case management  Goal:  Over the next 30 days, the patient will not experience hospital readmission  Interventions:   Chronic Kidney Disease Interventions: Evaluation of current treatment plan related to chronic kidney disease self management and patient's  adherence to plan as established by provider      Reviewed medications with patient and discussed importance of compliance    Reviewed scheduled/upcoming provider appointments including   primary care provider Last practice recorded BP readings:  BP Readings from Last 3 Encounters:  10/26/23 (!) 151/82  10/23/23 101/62  10/17/23 (!) 148/94  Reinforced signs / symptoms of infection/ UTI Reviewed importance of staying well hydrated, drinking water Reinforced importance of continuing to stay in close contact with [facility]plant team, continue reporting any unwanted side effects of medication, change in health status/ symptoms Most recent eGFR/CrCl: No results found for: EGFR  No components found for: CRCL Reviewed AVS from primary care provider visit today Reviewed plan of care with pt including TOC case closure and transfer to longitudinal case management, order placed  Patient Self Care Activities:        Increase fluid intake Attend all scheduled provider appointments Call pharmacy for medication refills 3-7 days in advance of running out of medications Call provider office for new concerns or questions  Notify RN Care Manager of TOC call rescheduling needs Participate in Transition of Care Program/Attend TOC scheduled calls Perform all self care activities independently  Perform IADL's (shopping, preparing meals, housekeeping, managing finances) independently Take medications as prescribed   Please continue to stay in close contact with [facility] transplant team, complete bloodwork and urinalysis this week  Plan:    Transfer to longitudinal case management        Patient verbalizes understanding  of instructions and care plan provided today and agrees to view in MyChart. Active MyChart status and patient understanding of how to access instructions and care plan via MyChart confirmed with patient.     No further follow up required: TOC case closure  Please call the care guide  team at [phone] if you need to cancel or reschedule your appointment.   Please call the Suicide and Crisis Lifeline: 988 call the [place]  National Suicide Prevention Lifeline: 815-160-7516 or TTY: 419-666-1944 TTY 712-129-3610) to talk to a trained counselor call 1-800-273-TALK (toll free, 24 hour hotline) go to [facility] [address] [place] [phone]) call 911 if you are experiencing a Mental Health or Behavioral Health Crisis or need someone to talk to.  Mliss Creed [facility], BSN RN Care Manager/ Transition of Care [place]/ [facility] [phone]

## 2023-11-28 NOTE — Patient Instructions (Addendum)
 It was very nice to see you today!  Worse-ER     PLEASE NOTE:  If you had any lab tests please let us  know if you have not heard back within a few days. You may see your results on MyChart before we have a chance to review them but we will give you a call once they are reviewed by us . If we ordered any referrals today, please let us  know if you have not heard from their office within the next week.   Please try these tips to maintain a healthy lifestyle:  Eat most of your calories during the day when you are active. Eliminate processed foods including packaged sweets (pies, cakes, cookies), reduce intake of potatoes, white bread, white pasta, and white rice. Look for whole grain options, oat flour or almond flour.  Each meal should contain half fruits/vegetables, one quarter protein, and one quarter carbs (no bigger than a computer mouse).  Cut down on sweet beverages. This includes juice, soda, and sweet tea. Also watch fruit intake, though this is a healthier sweet option, it still contains natural sugar! Limit to 3 servings daily.  Drink at least 1 glass of water with each meal and aim for at least 8 glasses per day  Exercise at least 150 minutes every week.

## 2023-11-28 NOTE — Telephone Encounter (Signed)
Dr. Kulik, please see message and advise. 

## 2023-11-28 NOTE — Telephone Encounter (Signed)
 Copied from CRM [id number]. Topic: Clinical - Medical Advice >> Nov 28, 2023 11:03 AM Adelita E wrote: Reason for CRM: Patient saw Dr. Wendolyn today and after her appt she went over to [facility] to get an x-ray, patient stated the x-ray department is closed until Monday 6/30. Patient questioning if she can go anywhere else for this. Callback number [phone].

## 2023-11-28 NOTE — Telephone Encounter (Signed)
 Pt will be going to [facility] for x-ray. They will contact pt.

## 2023-11-28 NOTE — Progress Notes (Signed)
 Subjective:     Patient ID: Cassandra [name], female    DOB: May 18, 1961, 63 y.o.   MRN: [id number]  Chief Complaint  Patient presents with   Fever    Pt c/o fever and chills with highest fever 101.5 2 nights ago. This only occurs at night.    HPI Discussed the use of AI scribe software for clinical note transcription with the patient, who gave verbal consent to proceed.  History of Present Illness Cassandra [name] is a 63 year old female with a history of liver transplant who presents with fevers.  She has been experiencing fevers and chills since November 05, 2023, primarily at night, with temperatures reaching up to 101.58F. The chills are severe enough to require her to turn off the air conditioning despite warm temperatures. She initially attempted to manage the symptoms at home with Tylenol  and two leftover antibiotic pills, believed to be cephalexin , but the symptoms persisted.  She has a history of liver transplant and is on immunosuppressive medications. She stopped taking mycophenolate  due to intolerance and was switched to McFerrick, another form of mycophenolate , which she also stopped. Currently, she is taking Prograf  and prednisone . Previous increases in prednisone  and mycophenolate  had caused her blood sugar to rise.  She has experienced shortness of breath, which has worsened since Saturday, and denies any cough, wheezing, or congestion. No nausea, vomiting, diarrhea, or urinary symptoms such as burning or frequency. She mentions feeling dehydrated and has been drinking Gatorade and water to compensate.  Her past medical history includes a significant episode where she was advised to go to the hospital due to a high white blood cell count, but no evidence of UTI was found despite initial suspicions. A recent urine test showed no UTI, and a liver biopsy indicated no chronic rejection.  She has lost weight unintentionally and has been trying to maintain hydration. She has been  resting more recently and has noticed a decrease in appetite, though she continues to eat and drink regularly.    There are no preventive care reminders to display for this patient.  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    Ruptured disc, cervical    multiple levels   Statin intolerance 05/13/2018    Past Surgical History:  Procedure Laterality Date   ABDOMINAL HYSTERECTOMY  2010   KNEE ARTHROSCOPY Right 2010   LIVER TRANSPLANT  1992   ROTATOR CUFF REPAIR Left 2010   x2     Current Outpatient Medications:    albuterol  (VENTOLIN  HFA) 108 (90 Base) MCG/ACT inhaler, Inhale 2 puffs into the lungs every 6 (six) hours as needed for wheezing or shortness of breath., Disp: 8 g, Rfl: 0   cefdinir  (OMNICEF ) 300 MG capsule, Take 1 capsule (300 mg total) by mouth 2 (two) times daily., Disp: 20 capsule,  Rfl: 0   diclofenac  Sodium (VOLTAREN ) 1 % GEL, Apply 4 g topically 4 (four) times daily as needed., Disp: 350 g, Rfl: 3   fluticasone  (FLONASE ) 50 MCG/ACT nasal spray, SHAKE LIQUID AND USE 2 SPRAYS IN EACH NOSTRIL DAILY, Disp: 16 g, Rfl: 6   levocetirizine (XYZAL ) 5 MG tablet, Take 1 tablet (5 mg total) by mouth every evening., Disp: 90 tablet, Rfl: 0   magnesium  oxide (MAG-OX) 400 MG tablet, Take 400 mg by mouth every evening., Disp: , Rfl:    olopatadine  (PATANOL) 0.1 % ophthalmic solution, Place 1 drop into both eyes 2 (two) times  daily., Disp: 5 mL, Rfl: 3   Omega-3 Fatty Acids (FISH OIL ) 1000 MG CAPS, Take 1 capsule (1,000 mg total) by mouth daily., Disp: 90 capsule, Rfl: 3   predniSONE  (DELTASONE ) 5 MG tablet, Take 5 mg by mouth daily with breakfast., Disp: , Rfl:    tacrolimus  (PROGRAF ) 1 MG capsule, Take 2 capsules (2 mg total) by mouth 2 (two) times daily., Disp: , Rfl:   Allergies  Allergen Reactions   Erythromycin Diarrhea    REACTION: Nausea/vomiting   Ezetimibe Other (See Comments)    Muscle Cramps Nausea Diarrhea   Other Nausea Only and Other (See Comments)    Knocks her out, or makes her sleepy and does not take them. Muscle Cramps Nausea Diarrhea Uncoded Allergy. Allergen: vioxx   Rofecoxib Other (See Comments)    Pt not sure but knows she can not take it. REACTION: Nausea/vomiting   Statins Other (See Comments)    myalgias   Zonisamide Other (See Comments)    Fogginess Fogginess    Carvacrol     Other reaction(s): Muscle Pain   Doxycycline  Other (See Comments)    LE cramping   Elavil  [Amitriptyline ] Other (See Comments)    groggy   Metoprolol      Other reaction(s): Muscle Pain   Nsaids     REACTION: GI Cramping/nausea   ROS neg/noncontributory except as noted HPI/below      Objective:     BP 110/74   Pulse (!) 110   Temp 98.1 F (36.7 C)   Ht 5' 5 (1.651 m)   Wt 176 lb 12.8 oz (80.2 kg)   SpO2 100%   BMI 29.42 kg/m  Wt Readings from Last 3 Encounters:  11/28/23 176 lb 12.8 oz (80.2 kg)  11/19/23 178 lb (80.7 kg)  11/07/23 178 lb 12.8 oz (81.1 kg)    Physical Exam   Gen: WDWN NAD HEENT: NCAT, conjunctiva not injected, sclera nonicteric TM WNL B, OP moist, no exudates  NECK:  supple, no thyromegaly, no nodes, no carotid bruits CARDIAC: tachy RRR, S1S2+, no murmur. DP 1+B LUNGS: CTAB. No wheezes ABDOMEN:  BS+, soft, NTND, No HSM, no masses. Sl cvatB EXT:  no edema MSK: no gross abnormalities.  NEURO: A&O x3.  CN II-XII intact.  PSYCH: normal mood. Good eye  contact  Results for orders placed or performed in visit on 11/28/23  POCT Urinalysis Dipstick (Automated)   Collection Time: 11/28/23  8:41 AM  Result Value Ref Range   Color, UA brown    Clarity, UA cloudy    Glucose, UA Negative Negative   Bilirubin, UA positive    Ketones, UA positive    Spec Grav, UA 1.020 1.010 - 1.025   Blood, UA positive    pH, UA 5.0 5.0 - 8.0   Protein, UA Positive (A) Negative   Urobilinogen, UA 0.2 0.2 or 1.0  E.U./dL   Nitrite, UA neg    Leukocytes, UA Large (3+) (A) Negative       Assessment & Plan:  Fever, unspecified fever cause -     Urine Culture; Future -     POCT Urinalysis Dipstick (Automated) -     DG Chest 2 View; Future -     CBC with Differential/Platelet; Future -     Comprehensive metabolic panel with GFR; Future -     Sedimentation rate; Future -     Culture, blood (single) w Reflex to ID Panel; Future  DOE (dyspnea on exertion) -     DG Chest 2 View; Future  Cystitis  Other orders -     Cefdinir ; Take 1 capsule (300 mg total) by mouth 2 (two) times daily.  Dispense: 20 capsule; Refill: 0  Assessment and Plan Assessment & Plan Fever and Chills   She has experienced fever and chills since June 3rd, primarily at night, with temperatures reaching 101.78F. Despite self-management with Tylenol  , symptoms persist, suggesting an underlying infection, possibly a UTI, as indicated by leukocytes and hematuria. Her dyspnea has worsened since Saturday, and dehydration is being managed with increased fluid intake. Immunosuppressants heighten her infection risk. Order stat labs and a chest x-ray at [facility] office. Start Omnicef  (cefdinir ) for 10 days. Advise increased fluid intake and instruct her to go to the ER if symptoms worsen.  Urinary Tract Infection (UTI)   Urinalysis reveals leukocytes and hematuria, confirming a UTI. Bilirubin presence suggests possible hepatic involvement, but the primary concern is infection. Her liver transplant and  immunosuppressants increase infection risk. Start Omnicef  (cefdinir ) for 10 days. Advise increased fluid intake to help clear the infection. Monitor for worsening symptoms and advise an ER visit if necessary.  Shortness of Breath   She reports worsening dyspnea since Saturday, exacerbated by exertion, though oxygen saturation remains at 100%, which is reassuring. This could be related to the current infection, anemia, or other. Order a chest x-ray to evaluate for pulmonary causes. Advise an ER visit if dyspnea worsens.  Liver Transplant Status   As a liver transplant recipient on Prograf  and prednisone , there is concern for rejection, but biopsy notes indicate no chronic rejection, suggesting medication-related injury. She was advised to restart mycophenolate  but is hesitant due to previous side effects and lack of evidence for rejection. Continue the current immunosuppressant regimen with Prograf  and prednisone . Monitor liver function tests closely and discuss concerns about mycophenolate  with the transplant team.  General Health Maintenance   She is advised to maintain hydration and nutrition, especially given recent weight loss and decreased appetite. Protein drinks are suggested to supplement nutrition if food intake is inadequate. Encourage increased fluid intake, especially water and Gatorade, and suggest protein drinks to maintain nutrition.  Follow-up   Close monitoring is required due to her complex medical history and current symptoms. Immediate follow-up with lab results and imaging is necessary to guide further management. Follow up with lab and x-ray results by noon. Advise an ER visit if symptoms worsen, especially over the weekend.    Return if symptoms worsen or fail to improve.  Jenkins CHRISTELLA Carrel, MD

## 2023-11-28 NOTE — Patient Outreach (Signed)
 Transition of Care week 5  Visit Note  11/28/2023  Name: Cassandra [name] MRN: [id number]          DOB: 1960-07-29  Situation: Patient enrolled in [facility] 30-day program. Visit completed with patient by telephone.   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    Ruptured disc, cervical    multiple levels   Statin intolerance 05/13/2018    Assessment: Patient Reported Symptoms: Cognitive Cognitive Status: Able to follow simple commands, Alert and oriented to person, place, and time, Normal speech and language skills      Neurological Neurological Review of Symptoms: No symptoms reported    HEENT HEENT Symptoms Reported: No symptoms reported      Cardiovascular Cardiovascular Symptoms Reported: No symptoms reported    Respiratory Respiratory Symptoms Reported: Shortness of breath Other Respiratory Symptoms: dypsnea mainly with exertion, hot weather makes it worse Additional Respiratory Details: saw primary care provider today 6/25 Respiratory Conditions: Shortness of breath Respiratory Self-Management Outcome: 3 (uncertain) Respiratory Comment: pt is CXR tomorrow 6/27  Endocrine Patient reports the following symptoms related to hypoglycemia or hyperglycemia : No symptoms reported Is patient diabetic?: Yes Is patient checking blood sugars at home?: No Endocrine Conditions: Diabetes Endocrine Management  Strategies: Adequate rest, Medication therapy, Diet modification Endocrine Self-Management Outcome: 4 (good) Endocrine Comment: pt states she no longer checks her blood sugar  Gastrointestinal Gastrointestinal Symptoms Reported: No symptoms reported      Genitourinary Genitourinary Symptoms Reported: No symptoms reported Additional Genitourinary Details: Pt saw primary care provider today and diagnosed with UTI, pt states she has NO symptoms Genitourinary Conditions: Urinary tract infection Genitourinary Management Strategies: Adequate rest, Medication therapy Genitourinary Self-Management Outcome: 3 (uncertain) Genitourinary Comment: pt is on antibiotic prescribed today 6/26  Integumentary Integumentary Symptoms Reported: No symptoms reported    Musculoskeletal Musculoskelatal Symptoms Reviewed: No symptoms reported        Psychosocial Psychosocial Symptoms Reported: No symptoms reported         There were no vitals filed for this visit.  Medications Reviewed Today     Reviewed by Aura Mliss LABOR, RN (Registered Nurse) on 11/28/23 at 1408  Med List Status: <None>   Medication Order Taking? Sig Documenting Provider Last Dose Status Informant  albuterol  (VENTOLIN  HFA) 108 (90 Base) MCG/ACT inhaler [id number] Yes Inhale 2 puffs into the lungs every 6 (six) hours as needed for wheezing or shortness of breath. Wendolyn Jenkins Jansky, MD  Active Self  cefdinir  (OMNICEF ) 300 MG capsule [id number] Yes Take 1 capsule (300 mg total) by mouth 2 (two) times daily. Wendolyn Jenkins Jansky, MD  Active   diclofenac  Sodium (VOLTAREN ) 1 % GEL [id number] Yes Apply 4 g topically 4 (four) times daily as needed. Jodie Lavern CROME, MD  Active Self  fluticasone  (FLONASE ) 50 MCG/ACT nasal spray  [id number] Yes SHAKE LIQUID AND USE 2 SPRAYS IN EACH NOSTRIL DAILY Jodie Lavern CROME, MD  Active Self  levocetirizine (XYZAL ) 5 MG tablet [id number] Yes Take 1 tablet (5 mg total) by mouth every evening. Arloa Suzen RAMAN, NP  Active  Self  magnesium  oxide (MAG-OX) 400 MG tablet [id number] Yes Take 400 mg by mouth every evening. [provider]  Active Self  olopatadine  (PATANOL) 0.1 % ophthalmic solution [id number] Yes Place 1 drop into both eyes 2 (two) times daily. Allwardt, Mardy HERO, PA-C  Active Self  Omega-3 Fatty Acids (FISH OIL ) 1000 MG CAPS [id number] Yes Take 1 capsule (1,000 mg total) by mouth daily. Jodie Lavern CROME, MD  Active Self  predniSONE  (DELTASONE ) 5 MG tablet [id number] Yes Take 5 mg by mouth daily with breakfast. [provider]  Active Self  tacrolimus  (PROGRAF ) 1 MG capsule [id number] Yes Take 2 capsules (2 mg total) by mouth 2 (two) times daily. Jodie Lavern CROME, MD  Active Self            Goals Addressed             This Visit's Progress    COMPLETED: VBCI Transitions of Care (TOC) Care Plan       Problems:  Recent Hospitalization for treatment of CKD Stage 3A and Urinary tract infection Diet/Nutrition/Food Resources Patient states since medication cellcept  was increased she has nausea at times, diarrhea maybe 1-2 times per day, joint pain, recurring infection, pt states she stays in close touch with [facility] and reported these side effects and let them know this increase in medication is not working well for her, she is awaiting call back from [place].  Pt denies pain today, denies signs/ symptoms of UTI 11/12/23- Pt states she is only taking prograf  and prednisone , states she is not taking cellcept  and nausea, diarrhea, joint pain has resolved, she discussed with primary care provider and referral in process for new GI and new hepatologist for [facility] or [facility], pt states she is having bloodwork and urinalysis at [facility] this week to be sent to [facility] and will follow up with them.  Pt saw primary care provider on 11/07/23.  11/28/23- pt reports she saw primary care provider today and diagnosed with UTI although she has no symptoms of UTI, reports she has some shortness of breath, worse with  exertion and the outside heat, having CXR tomorrow 6/27, pt agrees to transfer to longitudinal case management  Goal:  Over the next 30 days, the patient will not experience hospital readmission  Interventions:   Chronic Kidney Disease Interventions: Evaluation of current treatment plan related to chronic kidney disease self management and patient's adherence to plan as established by provider      Reviewed medications with patient and discussed importance of compliance    Reviewed scheduled/upcoming provider appointments including   primary care provider Last practice recorded BP readings:  BP Readings from Last 3 Encounters:  10/26/23 (!) 151/82  10/23/23 101/62  10/17/23 (!) 148/94  Reinforced signs / symptoms of infection/ UTI Reviewed importance of staying well hydrated, drinking water Reinforced importance of continuing to stay in close contact with [facility]plant team, continue reporting any unwanted side effects of medication, change in health status/ symptoms Most recent eGFR/CrCl: No results found for: EGFR  No components found for: CRCL Reviewed AVS from primary care provider visit today Reviewed plan of care with pt including TOC case closure and transfer to longitudinal case management, order placed  Patient Self Care  Activities:        Increase fluid intake Attend all scheduled provider appointments Call pharmacy for medication refills 3-7 days in advance of running out of medications Call provider office for new concerns or questions  Notify RN Care Manager of TOC call rescheduling needs Participate in Transition of Care Program/Attend TOC scheduled calls Perform all self care activities independently  Perform IADL's (shopping, preparing meals, housekeeping, managing finances) independently Take medications as prescribed   Please continue to stay in close contact with [facility] transplant team, complete bloodwork and urinalysis this week  Plan:    Transfer to  longitudinal case management        Recommendation:   PCP Follow-up Specialty provider follow-up [facility] transplant team  Follow Up Plan:   Closing From:  Transitions of Care Program Transfer to longitudinal case management  Mliss Creed [facility], BSN RN Care Manager/ Transition of Care [place]/ [facility] [phone]

## 2023-11-29 ENCOUNTER — Ambulatory Visit: Admitting: Family Medicine

## 2023-11-29 ENCOUNTER — Other Ambulatory Visit (INDEPENDENT_AMBULATORY_CARE_PROVIDER_SITE_OTHER)

## 2023-11-29 ENCOUNTER — Other Ambulatory Visit

## 2023-11-29 DIAGNOSIS — R509 Fever, unspecified: Secondary | ICD-10-CM | POA: Diagnosis not present

## 2023-11-29 LAB — COMPREHENSIVE METABOLIC PANEL WITH GFR
ALT: 208 U/L — ABNORMAL HIGH (ref 0–35)
AST: 212 U/L — ABNORMAL HIGH (ref 0–37)
Albumin: 3.6 g/dL (ref 3.5–5.2)
Alkaline Phosphatase: 659 U/L — ABNORMAL HIGH (ref 39–117)
BUN: 17 mg/dL (ref 6–23)
CO2: 21 meq/L (ref 19–32)
Calcium: 9.7 mg/dL (ref 8.4–10.5)
Chloride: 103 meq/L (ref 96–112)
Creatinine, Ser: 1.32 mg/dL — ABNORMAL HIGH (ref 0.40–1.20)
GFR: 42.96 mL/min — ABNORMAL LOW (ref >60.00)
Glucose, Bld: 124 mg/dL — ABNORMAL HIGH (ref 70–99)
Potassium: 3.9 meq/L (ref 3.5–5.1)
Sodium: 137 meq/L (ref 135–145)
Total Bilirubin: 0.9 mg/dL (ref 0.2–1.2)
Total Protein: 8.2 g/dL (ref 6.0–8.3)

## 2023-11-29 LAB — URINE CULTURE
MICRO NUMBER:: 16629280
SPECIMEN QUALITY:: ADEQUATE

## 2023-11-29 LAB — CBC WITH DIFFERENTIAL/PLATELET
Basophils Absolute: 0 10*3/uL (ref 0.0–0.1)
Basophils Relative: 0.6 % (ref 0.0–3.0)
Eosinophils Absolute: 0.3 10*3/uL (ref 0.0–0.7)
Eosinophils Relative: 3.5 % (ref 0.0–5.0)
HCT: 35.9 % — ABNORMAL LOW (ref 36.0–46.0)
Hemoglobin: 11.9 g/dL — ABNORMAL LOW (ref 12.0–15.0)
Lymphocytes Relative: 30.8 % (ref 12.0–46.0)
Lymphs Abs: 2.5 10*3/uL (ref 0.7–4.0)
MCHC: 33.3 g/dL (ref 30.0–36.0)
MCV: 84.2 fl (ref 78.0–100.0)
Monocytes Absolute: 0.7 10*3/uL (ref 0.1–1.0)
Monocytes Relative: 9.2 % (ref 3.0–12.0)
Neutro Abs: 4.5 10*3/uL (ref 1.4–7.7)
Neutrophils Relative %: 55.9 % (ref 43.0–77.0)
Platelets: 219 10*3/uL (ref 150.0–400.0)
RBC: 4.26 Mil/uL (ref 3.87–5.11)
RDW: 14.2 % (ref 11.5–15.5)
WBC: 8 10*3/uL (ref 4.0–10.5)

## 2023-11-29 LAB — SEDIMENTATION RATE: Sed Rate: 98 mm/h — ABNORMAL HIGH (ref 0–30)

## 2023-12-01 ENCOUNTER — Ambulatory Visit: Payer: Self-pay | Admitting: Family Medicine

## 2023-12-01 NOTE — Progress Notes (Signed)
 Urine culture was negative.  Sed rate-inflammation-is very high and so are liver tests-she needs to call her transplant team immediately!!!!!!!

## 2023-12-01 NOTE — Progress Notes (Signed)
 Urine negative.  How is she feeling?  Needs to get labs done

## 2023-12-02 ENCOUNTER — Other Ambulatory Visit

## 2023-12-02 ENCOUNTER — Ambulatory Visit (INDEPENDENT_AMBULATORY_CARE_PROVIDER_SITE_OTHER)

## 2023-12-02 DIAGNOSIS — R509 Fever, unspecified: Secondary | ICD-10-CM | POA: Diagnosis not present

## 2023-12-02 DIAGNOSIS — R0609 Other forms of dyspnea: Secondary | ICD-10-CM | POA: Diagnosis not present

## 2023-12-02 DIAGNOSIS — R0602 Shortness of breath: Secondary | ICD-10-CM | POA: Diagnosis not present

## 2023-12-04 NOTE — Telephone Encounter (Signed)
 Referral notes state to refer to a new gastroenterologist or hepatologist for transplant management, consider referral to [facility] or [facility] for specialist care.

## 2023-12-04 NOTE — Telephone Encounter (Signed)
 Referral received from [facility] Referral is requesting an appointment for liver transplant management. Looks like patient is currently managed with [facility], transplant in 1992. The office visit in the referral packet does state that [facility] is concerned about low grade rejection and patient is requesting a second opinion.   Office note with Dr. Myrna at [facility] along with recent labs are available in Care Everywhere.

## 2023-12-04 NOTE — Telephone Encounter (Signed)
 Referral for Encounter for immunosuppression management after liver transplant   Please advise how to schedule Thanks

## 2023-12-05 DIAGNOSIS — D849 Immunodeficiency, unspecified: Secondary | ICD-10-CM | POA: Diagnosis not present

## 2023-12-05 DIAGNOSIS — Z944 Liver transplant status: Secondary | ICD-10-CM | POA: Diagnosis not present

## 2023-12-05 LAB — CULTURE, BLOOD (SINGLE)
MICRO NUMBER:: 16634149
Result:: NO GROWTH
SPECIMEN QUALITY:: ADEQUATE

## 2023-12-05 NOTE — Telephone Encounter (Signed)
 Spoke with patient to offer 7/25 at 12:00 but I see that slot is already taken. Is there another date/time you would like for me to add this patient?

## 2023-12-05 NOTE — Telephone Encounter (Signed)
 Spoke with patient, she accepted 12/31/23 at 12:00 pm with Dr. March. Patient activated her protal.

## 2023-12-09 ENCOUNTER — Telehealth: Payer: Self-pay | Admitting: *Deleted

## 2023-12-09 NOTE — Progress Notes (Unsigned)
 Complex Care Management Note Care Guide Note  12/09/2023 Name: Cassandra [name] MRN: [id number] DOB: 1961-04-06   Complex Care Management Outreach Attempts: An unsuccessful telephone outreach was attempted today to offer the patient information about available complex care management services.  Follow Up Plan:  Additional outreach attempts will be made to offer the patient complex care management information and services.   Encounter Outcome:  No Answer  Thedford Franks, CMA [place]  [facility], [facility] Guide Direct Dial: [phone]  Fax: [phone] Website: [email or web address]

## 2023-12-10 DIAGNOSIS — R634 Abnormal weight loss: Secondary | ICD-10-CM | POA: Diagnosis not present

## 2023-12-10 DIAGNOSIS — R0602 Shortness of breath: Secondary | ICD-10-CM | POA: Diagnosis not present

## 2023-12-10 DIAGNOSIS — T8641 Liver transplant rejection: Secondary | ICD-10-CM | POA: Diagnosis not present

## 2023-12-10 DIAGNOSIS — D849 Immunodeficiency, unspecified: Secondary | ICD-10-CM | POA: Diagnosis not present

## 2023-12-10 DIAGNOSIS — R8281 Pyuria: Secondary | ICD-10-CM | POA: Diagnosis not present

## 2023-12-10 DIAGNOSIS — Z5181 Encounter for therapeutic drug level monitoring: Secondary | ICD-10-CM | POA: Diagnosis not present

## 2023-12-10 DIAGNOSIS — Z944 Liver transplant status: Secondary | ICD-10-CM | POA: Diagnosis not present

## 2023-12-10 DIAGNOSIS — Z79899 Other long term (current) drug therapy: Secondary | ICD-10-CM | POA: Diagnosis not present

## 2023-12-10 DIAGNOSIS — K838 Other specified diseases of biliary tract: Secondary | ICD-10-CM | POA: Diagnosis not present

## 2023-12-10 DIAGNOSIS — R509 Fever, unspecified: Secondary | ICD-10-CM | POA: Diagnosis not present

## 2023-12-10 NOTE — Progress Notes (Signed)
 Complex Care Management Note  Care Guide Note 12/10/2023 Name: [name] MRN: [id number] DOB: [birth year]  Cassandra [name] is a 63 y.o. year old female who sees Jodie Lavern CROME, MD for primary care. I reached out to Cassandra [name] by phone today to offer complex care management services.  [name] was given information about Complex Care Management services today including:   The Complex Care Management services include support from the care team which includes your Nurse Care Manager, Clinical Social Worker, or Pharmacist.  The Complex Care Management team is here to help remove barriers to the health concerns and goals most important to you. Complex Care Management services are voluntary, and the patient may decline or stop services at any time by request to their care team member.   Complex Care Management Consent Status: Patient agreed to services and verbal consent obtained.   Follow up plan:  Telephone appointment with complex care management team member scheduled for:  12/16/2023  Encounter Outcome:  Patient Scheduled  Thedford Franks, CMA [place]  [facility], [facility] Guide Direct Dial: [phone]  Fax: [phone] Website: [email or web address]

## 2023-12-12 ENCOUNTER — Ambulatory Visit: Admitting: Physician Assistant

## 2023-12-16 ENCOUNTER — Other Ambulatory Visit: Payer: Self-pay

## 2023-12-16 ENCOUNTER — Ambulatory Visit (INDEPENDENT_AMBULATORY_CARE_PROVIDER_SITE_OTHER): Admitting: Family Medicine

## 2023-12-16 ENCOUNTER — Encounter: Payer: Self-pay | Admitting: Family Medicine

## 2023-12-16 ENCOUNTER — Telehealth: Payer: Self-pay

## 2023-12-16 VITALS — BP 135/86 | HR 110 | Temp 98.8°F | Ht 65.0 in | Wt 173.8 lb

## 2023-12-16 DIAGNOSIS — R0609 Other forms of dyspnea: Secondary | ICD-10-CM

## 2023-12-16 DIAGNOSIS — R7989 Other specified abnormal findings of blood chemistry: Secondary | ICD-10-CM | POA: Diagnosis not present

## 2023-12-16 DIAGNOSIS — D849 Immunodeficiency, unspecified: Secondary | ICD-10-CM

## 2023-12-16 DIAGNOSIS — Z944 Liver transplant status: Secondary | ICD-10-CM | POA: Diagnosis not present

## 2023-12-16 DIAGNOSIS — R509 Fever, unspecified: Secondary | ICD-10-CM | POA: Diagnosis not present

## 2023-12-16 LAB — POCT URINALYSIS DIPSTICK
Bilirubin, UA: NEGATIVE
Blood, UA: 2 — AB
Glucose, UA: NEGATIVE
Ketones, UA: NEGATIVE
Nitrite, UA: NEGATIVE
Protein, UA: NEGATIVE
Spec Grav, UA: 1.015 (ref 1.010–1.025)
Urobilinogen, UA: 1 U/dL
pH, UA: 5 (ref 5.0–8.0)

## 2023-12-16 NOTE — Telephone Encounter (Signed)
 Copied from CRM [id number]. Topic: General - Other >> Dec 13, 2023  4:41 PM Lavanda D wrote: Reason for CRM: Manuelita Novak a liver doctor at [facility] would like to speak with Dr. Jodie. Stated it is not urgent but would like a call back if possible.   Cell: # [phone]  Please see call msg regarding patient.

## 2023-12-16 NOTE — Patient Outreach (Signed)
 Complex Care Management   Visit Note  12/16/2023  Name:  Cassandra [name] MRN: [id number] DOB: 02/07/1961  Situation: Referral received for Complex Care Management related to PMH of Liver transplant (patient is followed by [facility]), CKD 3a and recent UTI with recurrent fevers. I obtained verbal consent from Patient.  Visit completed with patient  on the phone  Background:   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    Ruptured disc, cervical    multiple levels   Statin intolerance 05/13/2018    Assessment: Patient Reported Symptoms:  Cognitive Cognitive Status: Alert and oriented to person, place, and time, Normal speech and language skills, Insightful and able to interpret abstract concepts Cognitive/Intellectual Conditions Management [RPT]: None reported or documented in medical history or problem list   Health Maintenance Behaviors: Annual physical exam, Healthy diet, Spiritual practice(s) Healing Pattern: Unsure Health Facilitated by: Healthy diet, Rest  Neurological Neurological Review of Symptoms: No symptoms reported    HEENT HEENT Symptoms Reported: No symptoms reported      Cardiovascular Cardiovascular Symptoms Reported: No symptoms reported Cardiovascular Self-Management Outcome: 4 (good)  Respiratory      Endocrine Endocrine Symptoms Reported: No symptoms reported Is patient diabetic?: Yes Is patient  checking blood sugars at home?: No Endocrine Self-Management Outcome: 4 (good) Endocrine Comment: Patient states she no longer checks her blood sugar. Latest A1c = 6.5 on 11/07/23  Gastrointestinal Gastrointestinal Symptoms Reported: No symptoms reported      Genitourinary Genitourinary Symptoms Reported: Other Other Genitourinary Symptoms: Has appt today 12/16/23 w PCP to follow up on fever & suspected UTI Additional Genitourinary Details: Pt is seeing her PCP this afternoon, 12/16/23 - patient has a fever and symptoms consistent with UTI - wil follow up wtih patient 12/20/23 @ 2:30p to complete Initial Assessment for CCM enrollment Genitourinary Comment: As of 12/16/23, pt no longer on antibiotic, has Acute PCP appt with Dr. Jodie this afternoon to have symptoms of UTI and fever assessed  Integumentary Integumentary Symptoms Reported: No symptoms reported    Musculoskeletal Musculoskelatal Symptoms Reviewed: No symptoms reported   Falls in the past year?: No Number of falls in past year: 1 or less Was there an injury with Fall?: No Fall Risk Category Calculator: 0 Patient Fall Risk Level: Low Fall Risk Patient at Risk for Falls Due to: No Fall Risks  Psychosocial Psychosocial Symptoms Reported: No symptoms reported Behavioral Health Self-Management Outcome: 4 (good)   Quality of Family Relationships: helpful, involved, supportive      12/16/2023    3:12 PM  Depression screen PHQ 2/9  Decreased Interest 0  Down, Depressed, Hopeless 0  PHQ - 2 Score 0    There were no vitals filed for this visit.  Medications Reviewed Today     Reviewed by [name], RN (Registered Nurse) on  12/16/23 at 2355  Med List Status: <None>   Medication Order Taking? Sig Documenting Provider Last Dose Status Informant  albuterol  (VENTOLIN  HFA) 108 (90 Base) MCG/ACT inhaler [id number] Yes Inhale 2 puffs into the lungs every 6 (six) hours as needed for wheezing or shortness of breath. Wendolyn Jenkins Jansky,  MD  Active Self  cefdinir  (OMNICEF ) 300 MG capsule [id number] Yes Take 1 capsule (300 mg total) by mouth 2 (two) times daily. Wendolyn Jenkins Jansky, MD  Active   diclofenac  Sodium (VOLTAREN ) 1 % GEL [id number] Yes Apply 4 g topically 4 (four) times daily as needed. Jodie Lavern CROME, MD  Active Self  fluticasone  (FLONASE ) 50 MCG/ACT nasal spray [id number] Yes SHAKE LIQUID AND USE 2 SPRAYS IN EACH NOSTRIL DAILY Jodie Lavern CROME, MD  Active Self  levocetirizine (XYZAL ) 5 MG tablet [id number] Yes Take 1 tablet (5 mg total) by mouth every evening. Arloa Suzen RAMAN, NP  Active Self  magnesium  oxide (MAG-OX) 400 MG tablet [id number] Yes Take 400 mg by mouth every evening. [provider]  Active Self  olopatadine  (PATANOL) 0.1 % ophthalmic solution [id number] Yes Place 1 drop into both eyes 2 (two) times daily. Allwardt, Mardy HERO, PA-C  Active Self  Omega-3 Fatty Acids (FISH OIL ) 1000 MG CAPS [id number] Yes Take 1 capsule (1,000 mg total) by mouth daily. Jodie Lavern CROME, MD  Active Self  predniSONE  (DELTASONE ) 5 MG tablet [id number] Yes Take 5 mg by mouth daily with breakfast. [provider]  Active Self  tacrolimus  (PROGRAF ) 1 MG capsule [id number] Yes Take 2 capsules (2 mg total) by mouth 2 (two) times daily. Jodie Lavern CROME, MD  Active Self            Recommendation:   PCP Follow-up scheduled for this afternoon, 7/14/ 25 @ 3pm regarding recurrent fevers and symptoms of infection, possibly UTI  Follow Up Plan:   Telephone follow up appointment date/time:  12/20/23 at 2:30pm with RN Care Manager Channing L to complete Initial Assessment for CCM enrollment and completion of Care Plan.  Sangeeta Youse A. Gordy RN, BA, [facility], CRRN   [facility] Population Health RN Care Manager Direct Dial: [phone]  Fax: [phone]

## 2023-12-17 ENCOUNTER — Ambulatory Visit: Payer: Self-pay | Admitting: Family Medicine

## 2023-12-17 LAB — URINE CULTURE
MICRO NUMBER:: 16694841
Result:: NO GROWTH
SPECIMEN QUALITY:: ADEQUATE

## 2023-12-17 LAB — URINALYSIS, MICROSCOPIC ONLY

## 2023-12-17 NOTE — Progress Notes (Signed)
 See mychart note Dear Ms. Setzer, Your urine shows that the specimen was not a perfect clean catch, thus there are some abnormals noted but the urine culture is again negative. There is no sign of bladder or kidney infection at this time. Please let me know if your fevers persist. Sincerely, Dr. Jodie

## 2023-12-17 NOTE — Progress Notes (Signed)
 Subjective  CC:  Chief Complaint  Patient presents with   Fever    HPI: Cassandra [name] is a 63 y.o. female who presents to the office today to address the problems listed above in the chief complaint. 63 year old female with history of liver transplant here for follow-up.  I spoke to her liver transplant specialist at [facility], Dr. Myrna today.  Complicated case.  Most recently, hospitalized several months ago for nausea and vomiting.  Then was readmitted due to possible sepsis with elevated white count to 22,000 and fevers.  No clear source of infection was ever clearly identified.  She has had urinary testing that showed pyuria but follow-up cultures were negative.  She has been treated with several rounds of antibiotics.  Also ongoing has been worked up for elevating LFTs.  She is status post liver biopsy, differential includes possible rejection versus medication side effect versus cholangitis or obstruction.  Unfortunately, her testing has been inconclusive.  She had a liver MRI done and I think back in May which looked stable, a follow-up CT scan showed possible biliary duct dilation but Dr. Rayfield thought this could be related to her past history of transplant.  Patient is very concerned that it was the antirejection medications that are causing her problems and so has since stopped them.  Her specialist has tried different dosing regimens and different medications without alleviation of the problem.  A few weeks ago her liver tests were elevated in the 200s, when rechecked last week they were down to the 100s.  She is no longer having nausea.  However she does report a new fever again over the weekend to 100.1.  I reviewed recent lab work.  White blood cell count is stable.  Most recent urine culture was again negative. Fever: Thought previously could be related to sinus infection.  This has been treated effectively.  No skin lesions, no red hot swollen joints, has long history of recurrent knee  effusions but none now.  No urinary symptoms.  No longer having diarrhea.  No new rashes. Also, patient has been complaining of dyspnea on exertion.  Described more as decrease stamina.  Now when she is doing household tasks or walking long distances, she will feel winded.  She denies chest pain, diaphoresis, nausea or vomiting palpitations or lightheadedness.  She has no known lung disease.  A recent BMP was normal.  A recent chest x-ray was normal.  No history of heart disease.  However she does have risk factors of diet-controlled diabetes and hyperlipidemia.  Assessment  1. Fever, unspecified fever cause   2. DOE (dyspnea on exertion)   3. Immunosuppressed status (HCC)   4. History of liver transplant (HCC)   5. Elevated liver function tests      Plan  Fever, dyspnea exertion, elevated liver test in patient with liver transplant: Unclear etiology.  Dr. Myrna is continuing to follow her lab test and considering further workup for possible rejection or other hepatic causes for symptom complex.  Today I will check urine culture to ensure this remains negative.  No other clear source of infection at this time.  Not sure if inflammatory response or drug fever is a possibility.  May warrant referral to infectious disease for further clarification.  Patient to monitor her fever curve. Shortness of breath: Patient has been very stressed and less active, unclear if this is related to deconditioning or other.  Will check echocardiogram and consider referral to cardiology and/or pulmonology if persist.  Patient understands and agrees.  Follow up: as scheduled  I spent a total of 52 minutes for this patient encounter. Time spent included preparation, face-to-face counseling with the patient and coordination of care, telephone call to liver specialist, review of chart and records, and documentation of the encounter.   Orders Placed This Encounter  Procedures   Urine Culture   Urine Microscopic Only    POCT Urinalysis Dipstick   ECHOCARDIOGRAM COMPLETE   No orders of the defined types were placed in this encounter.     I reviewed the patients updated PMH, FH, and SocHx.    Patient Active Problem List   Diagnosis Date Noted   Lumbar spinal stenosis 05/10/2022    Priority: High   Diet-controlled type 2 diabetes mellitus (HCC) 08/28/2021    Priority: High   Chronic kidney disease (CKD) stage G3a/A1, moderately decreased glomerular filtration rate (GFR) between 45-59 mL/min/1.73 square meter and albuminuria creatinine ratio less than 30 mg/g (HCC) 03/01/2021    Priority: High   Statin myopathy 05/13/2018    Priority: High   Primary insomnia 11/04/2017    Priority: High   Immunosuppressed status (HCC) 10/07/2017    Priority: High   Adenomatous polyp of colon 09/28/2017    Priority: High   Combined hyperlipidemia associated with type 2 diabetes mellitus (HCC) 03/10/2013    Priority: High   Primary osteoarthritis of knees, bilateral 03/01/2013    Priority: High   Chronic back pain 10/13/2012    Priority: High   History of liver transplant (HCC) 02/22/2009    Priority: High   Migraine with aura and without status migrainosus, not intractable 07/03/2018    Priority: Medium    Cervical facet joint syndrome 03/28/2018    Priority: Medium    Spondylosis of cervical region without myelopathy or radiculopathy 03/28/2018    Priority: Medium    Lumbar facet arthropathy 12/27/2017    Priority: Medium    Cervical stenosis of spine 01/25/2017    Priority: Medium    Fibromyalgia 08/24/2013    Priority: Medium    Hx of tuberculosis 10/13/2012    Priority: Medium    Anxiety disorder 09/16/2007    Priority: Medium    AS (sickle cell trait) (HCC) 06/29/2019    Priority: Low   Sepsis (HCC) 10/24/2023   Screening for osteoporosis 05/15/2023   Current Meds  Medication Sig   albuterol  (VENTOLIN  HFA) 108 (90 Base) MCG/ACT inhaler Inhale 2 puffs into the lungs every 6 (six) hours as  needed for wheezing or shortness of breath.   cefdinir  (OMNICEF ) 300 MG capsule Take 1 capsule (300 mg total) by mouth 2 (two) times daily.   diclofenac  Sodium (VOLTAREN ) 1 % GEL Apply 4 g topically 4 (four) times daily as needed.   fluticasone  (FLONASE ) 50 MCG/ACT nasal spray SHAKE LIQUID AND USE 2 SPRAYS IN EACH NOSTRIL DAILY   levocetirizine (XYZAL ) 5 MG tablet Take 1 tablet (5 mg total) by mouth every evening.   magnesium  oxide (MAG-OX) 400 MG tablet Take 400 mg by mouth every evening.   olopatadine  (PATANOL) 0.1 % ophthalmic solution Place 1 drop into both eyes 2 (two) times daily.   Omega-3 Fatty Acids (FISH OIL ) 1000 MG CAPS Take 1 capsule (1,000 mg total) by mouth daily.   predniSONE  (DELTASONE ) 5 MG tablet Take 5 mg by mouth daily with breakfast.   tacrolimus  (PROGRAF ) 1 MG capsule Take 2 capsules (2 mg total) by mouth 2 (two) times daily.    Allergies: Patient is  allergic to erythromycin, ezetimibe, other, rofecoxib, statins, zonisamide, carvacrol, doxycycline , elavil  [amitriptyline ], metoprolol , and nsaids. Family History: Patient family history includes Alzheimer's disease in her mother; Cancer in her sister; Dementia in her mother; Diabetes in her brother and mother; Gout in her mother; Heart disease in her sister; Hypertension in her mother and sister; Stroke in her mother. Social History:  Patient  reports that she has never smoked. She has never used smokeless tobacco. She reports that she does not drink alcohol and does not use drugs.  Review of Systems: Constitutional: Negative for fever malaise or anorexia Cardiovascular: negative for chest pain Respiratory: negative for SOB or persistent cough Gastrointestinal: negative for abdominal pain  Objective  Vitals: BP 135/86   Pulse (!) 110   Temp 98.8 F (37.1 C)   Ht 5' 5 (1.651 m)   Wt 173 lb 12.8 oz (78.8 kg)   SpO2 100%   BMI 28.92 kg/m  General: no acute distress , A&Ox3, patient appears well today HEENT:  PEERL, conjunctiva normal, neck is supple Cardiovascular:  RRR without murmur or gallop.  No rub Respiratory:  Good breath sounds bilaterally, CTAB with normal respiratory effort, no wheezing rales or rhonchi Abdomen is soft Skin:  Warm, no rashes, no petechiae,   Commons side effects, risks, benefits, and alternatives for medications and treatment plan prescribed today were discussed, and the patient expressed understanding of the given instructions. Patient is instructed to call or message via MyChart if he/she has any questions or concerns regarding our treatment plan. No barriers to understanding were identified. We discussed Red Flag symptoms and signs in detail. Patient expressed understanding regarding what to do in case of urgent or emergency type symptoms.  Medication list was reconciled, printed and provided to the patient in AVS. Patient instructions and summary information was reviewed with the patient as documented in the AVS. This note was prepared with assistance of Dragon voice recognition software. Occasional wrong-word or sound-a-like substitutions may have occurred due to the inherent limitations of voice recognition software

## 2023-12-19 ENCOUNTER — Telehealth: Payer: Self-pay

## 2023-12-19 ENCOUNTER — Ambulatory Visit: Admitting: Family Medicine

## 2023-12-19 NOTE — Telephone Encounter (Signed)
 Copied from CRM [id number]. Topic: General - Other >> Dec 18, 2023 11:37 AM Burnard DEL wrote: Reason for CRM: Patient called in stating that for the last 2 days she has not had any issues with breathing of Shortness of breathe ,and she hasn't had any fevers as well. She thinks that all of her symptoms were from the weather. She doesn't think she needs the echocardiogram anymore.  [email or web address]

## 2023-12-20 ENCOUNTER — Other Ambulatory Visit: Payer: Self-pay

## 2023-12-22 NOTE — Patient Instructions (Signed)
 Visit Information  Thank you for taking time to visit with me today. Please don't hesitate to contact me if I can be of assistance to you before our next scheduled telephone appointment.  Our next appointment is by telephone on 01/03/24 at 2pm  Following is a copy of your care plan:   Goals Addressed             This Visit's Progress    VBCI RN Care Plan       Problems:  Chronic Disease Management support and education needs related to CKD Stage 3A  Goal: Over the next 6 months the Patient will continue to work with RN Care Manager and/or Social Worker to address care management and care coordination needs related to CKD Stage 3A as evidenced by adherence to care management team scheduled appointments      Interventions:    Chronic Kidney Disease Interventions: Evaluation of current treatment plan related to chronic kidney disease self management and patient's adherence to plan as established by provider      Reviewed medications with patient and discussed importance of compliance    Counseled on the importance of exercise goals with target of 150 minutes per week     Advised patient, providing education and rationale, to monitor blood pressure daily and record, calling PCP for findings outside established parameters    Discussed complications of poorly controlled blood pressure such as heart disease, stroke, circulatory complications, vision complications, kidney impairment, sexual dysfunction    Reviewed scheduled/upcoming provider appointments including    Discussed plans with patient for ongoing care management follow up and provided patient with direct contact information for care management team    Screening for signs and symptoms of depression related to chronic disease state      Assessed social determinant of health barriers    Last practice recorded BP readings:  BP Readings from Last 3 Encounters:  12/16/23 135/86  11/28/23 110/74  11/07/23 106/74   Most recent  eGFR/CrCl: No results found for: EGFR  No components found for: CRCL  Patient Self-Care Activities:  Attend all scheduled provider appointments Call pharmacy for medication refills 3-7 days in advance of running out of medications Call provider office for new concerns or questions  Perform all self care activities independently  Perform IADL's (shopping, preparing meals, housekeeping, managing finances) independently Take medications as prescribed   Work with the social worker to address care coordination needs and will continue to work with the clinical team to address health care and disease management related needs  Plan:  The patient has been provided with contact information for the care management team and has been advised to call with any health related questions or concerns.           VBCI RN Care Plan       Problems:  Chronic Disease Management support and education needs related to Immunosuppressed Status d/t History of Liver Transplant  Goal: Over the next 6 months the Patient will continue to work with RN Care Manager and/or Social Worker to address care management and care coordination needs related to Immunosuppressed Status d/t History of Liver Transplant as evidenced by adherence to care management team scheduled appointments      Interventions:   Evaluation of current treatment plan related to Immunosuppressed Status d/t History of Liver Transplant,  self-management and patient's adherence to plan as established by provider. Discussed plans with patient for ongoing care management follow up and provided patient with direct contact information  for care management team Discussed plans with patient for ongoing care management follow up and provided patient with direct contact information for care management team Screening for signs and symptoms of depression related to chronic disease state  Assessed social determinant of health barriers  Patient Self-Care  Activities:  Attend all scheduled provider appointments Call pharmacy for medication refills 3-7 days in advance of running out of medications Call provider office for new concerns or questions  Perform all self care activities independently  Perform IADL's (shopping, preparing meals, housekeeping, managing finances) independently Take medications as prescribed    Plan:  The patient has been provided with contact information for the care management team and has been advised to call with any health related questions or concerns.              Patient verbalizes understanding of instructions and care plan provided today and agrees to view in MyChart. Active MyChart status and patient understanding of how to access instructions and care plan via MyChart confirmed with patient.     The patient has been provided with contact information for the care management team and has been advised to call with any health related questions or concerns.   Please call the care guide team at [phone] if you need to cancel or reschedule your appointment.   Please call 1-800-273-TALK (toll free, 24 hour hotline) if you are experiencing a Mental Health or Behavioral Health Crisis or need someone to talk to.  Rejeana Fadness A. Gordy RN, BA, [facility], CRRN [place]  [facility] Population Health RN Care Manager Direct Dial: [phone]  Fax: [phone]

## 2023-12-23 NOTE — Patient Outreach (Signed)
 Complex Care Management   Visit Note  12/20/2023  Name:  Cassandra [name] MRN: [id number] DOB: 1961-02-25  Situation: Referral received for Complex Care Management related to Chronic Kidney Disease and and Immunosuppressed status secondary to History of Liver Transplant I obtained verbal consent from Patient.  Visit completed with patient  on the phone  Background:   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    Ruptured disc, cervical    multiple levels   Statin intolerance 05/13/2018    Assessment: Patient Reported Symptoms:  Cognitive Cognitive Status: Alert and oriented to person, place, and time, Insightful and able to interpret abstract concepts, Normal speech and language skills Cognitive/Intellectual Conditions Management [RPT]: None reported or documented in medical history or problem list   Health Maintenance Behaviors: Annual physical exam, Healthy diet, Spiritual practice(s) Healing Pattern: Unsure Health Facilitated by: Healthy diet, Prayer/meditation, Rest  Neurological Neurological Review of Symptoms: No symptoms reported    HEENT HEENT Symptoms Reported: No symptoms reported      Cardiovascular Cardiovascular Symptoms Reported: No symptoms reported Cardiovascular Self-Management Outcome: 4 (good)  Respiratory Respiratory Symptoms Reported: No symptoms reported    Endocrine Endocrine Symptoms Reported: No  symptoms reported Is patient diabetic?: Yes Endocrine Self-Management Outcome: 4 (good)  Gastrointestinal Gastrointestinal Symptoms Reported: No symptoms reported      Genitourinary Genitourinary Symptoms Reported: No symptoms reported Other Genitourinary Symptoms: 7/14 followed up with her PCP, Dr. Lavern Heck, negative for UTI, patient told me today, 7/18 that her fever has resolved and not experiencing any dyspnea on exertion and suspects her fever and dyspnea was d/t to her Sickle Cell Trait Genitourinary Management Strategies: Coping strategies Genitourinary Self-Management Outcome: 4 (good) Genitourinary Comment: symptom free of fever/UTI  Integumentary Integumentary Symptoms Reported: No symptoms reported    Musculoskeletal Musculoskelatal Symptoms Reviewed: No symptoms reported        Psychosocial Psychosocial Symptoms Reported: No symptoms reported Behavioral Health Self-Management Outcome: 4 (good) Major Change/Loss/Stressor/Fears (CP): Medical condition, self Techniques to Cope with Loss/Stress/Change: Spiritual practice(s) Quality of Family Relationships: helpful, supportive, involved Do you feel physically threatened by others?: No      12/16/2023    3:12 PM  Depression screen PHQ 2/9  Decreased Interest 0  Down, Depressed, Hopeless 0  PHQ - 2 Score 0    There were no vitals filed for this visit.  Medications Reviewed Today   Medications were not reviewed in this encounter     Recommendation:   Continue Current Plan of Care  Follow Up Plan:   Telephone follow up appointment date/time:  01/03/2024 at 2pm with RN Care Manager Channing Channing A. Gordy RN, BA, [facility], CRRN   [facility] Population Health RN Care Manager Direct Dial: [phone]  Fax: [phone]

## 2023-12-25 DIAGNOSIS — D849 Immunodeficiency, unspecified: Secondary | ICD-10-CM | POA: Diagnosis not present

## 2023-12-25 DIAGNOSIS — Z9482 Intestine transplant status: Secondary | ICD-10-CM | POA: Diagnosis not present

## 2023-12-25 DIAGNOSIS — Z944 Liver transplant status: Secondary | ICD-10-CM | POA: Diagnosis not present

## 2023-12-31 DIAGNOSIS — T8641 Liver transplant rejection: Secondary | ICD-10-CM | POA: Diagnosis not present

## 2023-12-31 DIAGNOSIS — Z7960 Long term (current) use of unspecified immunomodulators and immunosuppressants: Secondary | ICD-10-CM | POA: Diagnosis not present

## 2023-12-31 DIAGNOSIS — Z944 Liver transplant status: Secondary | ICD-10-CM | POA: Diagnosis not present

## 2024-01-03 ENCOUNTER — Other Ambulatory Visit: Payer: Self-pay

## 2024-01-06 NOTE — Patient Instructions (Signed)
 Visit Information  Thank you for taking time to visit with me today. Please don't hesitate to contact me if I can be of assistance to you before our next scheduled telephone appointment.  Our next appointment is by telephone on 01/31/24 at 2pm  Following is a copy of your care plan:   Goals Addressed             This Visit's Progress    VBCI RN Care Plan       Problems:  Chronic Disease Management support and education needs related to CKD Stage 3A  Goal: Over the next 5 months the Patient will continue to work with RN Care Manager and/or Social Worker to address care management and care coordination needs related to CKD Stage 3A as evidenced by adherence to care management team scheduled appointments      Interventions:    Chronic Kidney Disease Interventions: Evaluation of current treatment plan related to chronic kidney disease self management and patient's adherence to plan as established by provider      Reviewed medications with patient and discussed importance of compliance    Counseled on the importance of exercise goals with target of 150 minutes per week     Advised patient, providing education and rationale, to monitor blood pressure daily and record, calling PCP for findings outside established parameters    Discussed complications of poorly controlled blood pressure such as heart disease, stroke, circulatory complications, vision complications, kidney impairment, sexual dysfunction    Reviewed scheduled/upcoming provider appointments including    Discussed plans with patient for ongoing care management follow up and provided patient with direct contact information for care management team    Screening for signs and symptoms of depression related to chronic disease state      Assessed social determinant of health barriers    Last practice recorded BP readings:  BP Readings from Last 3 Encounters:  12/16/23 135/86  11/28/23 110/74  11/07/23 106/74   Most recent  eGFR/CrCl: No results found for: EGFR  No components found for: CRCL  Patient Self-Care Activities:  Attend all scheduled provider appointments Call pharmacy for medication refills 3-7 days in advance of running out of medications Call provider office for new concerns or questions  Perform all self care activities independently  Perform IADL's (shopping, preparing meals, housekeeping, managing finances) independently Take medications as prescribed   Work with the social worker to address care coordination needs and will continue to work with the clinical team to address health care and disease management related needs  Plan:  The patient has been provided with contact information for the care management team and has been advised to call with any health related questions or concerns.  Next tele-appt with RN Care Manager Channing CROME is scheduled for 01/31/24 at 2pm.           VBCI RN Care Plan       Problems:  Chronic Disease Management support and education needs related to Immunosuppressed Status d/t History of Liver Transplant  Goal: Over the next 5 months the Patient will continue to work with RN Care Manager and/or Social Worker to address care management and care coordination needs related to Immunosuppressed Status d/t History of Liver Transplant as evidenced by adherence to care management team scheduled appointments      Interventions:   Evaluation of current treatment plan related to Immunosuppressed Status d/t History of Liver Transplant,  self-management and patient's adherence to plan as established by provider. Discussed plans  with patient for ongoing care management follow up and provided patient with direct contact information for care management team Discussed plans with patient for ongoing care management follow up and provided patient with direct contact information for care management team Screening for signs and symptoms of depression related to chronic disease  state  Assessed social determinant of health barriers  Patient Self-Care Activities:  Attend all scheduled provider appointments Call pharmacy for medication refills 3-7 days in advance of running out of medications Call provider office for new concerns or questions  Perform all self care activities independently  Perform IADL's (shopping, preparing meals, housekeeping, managing finances) independently Take medications as prescribed    Plan:  The patient has been provided with contact information for the care management team and has been advised to call with any health related questions or concerns.  Next appointment with RN Care Manager Tashaya Ancrum L. on 01/31/2024 at 2 pm             Patient verbalizes understanding of instructions and care plan provided today and agrees to view in MyChart. Active MyChart status and patient understanding of how to access instructions and care plan via MyChart confirmed with patient.     The patient has been provided with contact information for the care management team and has been advised to call with any health related questions or concerns.   Please call the care guide team at [phone] if you need to cancel or reschedule your appointment.   Please call 1-800-273-TALK (toll free, 24 hour hotline) if you are experiencing a Mental Health or Behavioral Health Crisis or need someone to talk to.  Almeda Ezra A. Gordy RN, BA, [facility], CRRN [place]  [facility] Population Health RN Care Manager Direct Dial: [phone]  Fax: [phone]

## 2024-01-06 NOTE — Patient Outreach (Signed)
 Complex Care Management   Visit Note  01/03/2024  Name:  Cassandra [name] MRN: [id number] DOB: 05/22/1961  Situation: Referral received for Complex Care Management related to Chronic Kidney Disease and Immunosuppressed Status due to History of Liver Transplant. I obtained verbal consent from Patient.  Visit completed with patient  on the phone  Background:   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    Ruptured disc, cervical    multiple levels   Statin intolerance 05/13/2018    Assessment: Patient Reported Symptoms:  Cognitive Cognitive Status: Normal speech and language skills, Insightful and able to interpret abstract concepts, Alert and oriented to person, place, and time Cognitive/Intellectual Conditions Management [RPT]: None reported or documented in medical history or problem list   Health Maintenance Behaviors: Annual physical exam, Healthy diet, Spiritual practice(s), Stress management Health Facilitated by: Healthy diet, Prayer/meditation, Rest, Stress management  Neurological Neurological Review of Symptoms: No symptoms reported    HEENT HEENT Symptoms Reported: No symptoms reported      Cardiovascular Cardiovascular Symptoms Reported: No symptoms reported    Respiratory Respiratory Symptoms Reported: No symptoms reported    Endocrine Endocrine Symptoms Reported: No symptoms reported Is patient diabetic?:  Yes Is patient checking blood sugars at home?: No Endocrine Self-Management Outcome: 4 (good)  Gastrointestinal Gastrointestinal Symptoms Reported: No symptoms reported      Genitourinary Genitourinary Symptoms Reported: No symptoms reported    Integumentary Integumentary Symptoms Reported: No symptoms reported    Musculoskeletal Musculoskelatal Symptoms Reviewed: No symptoms reported        Psychosocial Psychosocial Symptoms Reported: No symptoms reported Behavioral Health Self-Management Outcome: 4 (good) Major Change/Loss/Stressor/Fears (CP): Medical condition, self Techniques to Cope with Loss/Stress/Change: Spiritual practice(s) Quality of Family Relationships: helpful, supportive, involved Do you feel physically threatened by others?: No      01/06/2024   11:35 PM  Depression screen PHQ 2/9  Decreased Interest 0  Down, Depressed, Hopeless 0  PHQ - 2 Score 0    There were no vitals filed for this visit.  Medications Reviewed Today   Medications were not reviewed in this encounter     Recommendation:   Continue Current Plan of Care  Follow Up Plan:   Telephone follow up appointment date/time:  01/31/24 @ 2pm with RN Care Manager Channing LITTIE Channing A. Gordy RN, BA, [facility], CRRN [place]  [facility] Population Health RN Care Manager Direct Dial: [phone]  Fax: [phone]

## 2024-01-16 DIAGNOSIS — H1045 Other chronic allergic conjunctivitis: Secondary | ICD-10-CM | POA: Diagnosis not present

## 2024-01-16 DIAGNOSIS — H35721 Serous detachment of retinal pigment epithelium, right eye: Secondary | ICD-10-CM | POA: Diagnosis not present

## 2024-01-16 DIAGNOSIS — H25813 Combined forms of age-related cataract, bilateral: Secondary | ICD-10-CM | POA: Diagnosis not present

## 2024-01-16 DIAGNOSIS — S0993XS Unspecified injury of face, sequela: Secondary | ICD-10-CM | POA: Diagnosis not present

## 2024-01-17 DIAGNOSIS — Z9482 Intestine transplant status: Secondary | ICD-10-CM | POA: Diagnosis not present

## 2024-01-17 DIAGNOSIS — Z944 Liver transplant status: Secondary | ICD-10-CM | POA: Diagnosis not present

## 2024-01-17 DIAGNOSIS — D849 Immunodeficiency, unspecified: Secondary | ICD-10-CM | POA: Diagnosis not present

## 2024-01-27 ENCOUNTER — Ambulatory Visit (INDEPENDENT_AMBULATORY_CARE_PROVIDER_SITE_OTHER): Admitting: Family Medicine

## 2024-01-27 ENCOUNTER — Encounter: Payer: Self-pay | Admitting: Family Medicine

## 2024-01-27 VITALS — BP 131/87 | HR 84 | Temp 98.1°F | Resp 18 | Wt 178.2 lb

## 2024-01-27 DIAGNOSIS — D849 Immunodeficiency, unspecified: Secondary | ICD-10-CM | POA: Diagnosis not present

## 2024-01-27 DIAGNOSIS — Z944 Liver transplant status: Secondary | ICD-10-CM

## 2024-01-27 DIAGNOSIS — R7989 Other specified abnormal findings of blood chemistry: Secondary | ICD-10-CM | POA: Diagnosis not present

## 2024-01-27 DIAGNOSIS — J0101 Acute recurrent maxillary sinusitis: Secondary | ICD-10-CM

## 2024-01-27 DIAGNOSIS — J302 Other seasonal allergic rhinitis: Secondary | ICD-10-CM | POA: Diagnosis not present

## 2024-01-27 MED ORDER — AZELASTINE HCL 0.1 % NA SOLN: 1.0000 | Freq: Two times a day (BID) | NASAL | 2 refills | Status: AC | PRN

## 2024-01-27 MED ORDER — CEFDINIR 300 MG PO CAPS: 300.0000 mg | ORAL_CAPSULE | Freq: Two times a day (BID) | ORAL | 0 refills | Status: DC

## 2024-01-27 NOTE — Progress Notes (Signed)
 Subjective  CC:  Chief Complaint  Patient presents with   seasonal allergies    Pt c/o of water eyes, sneezing, mild cough for 2 weeks. Pt used cetrizine for symptoms.     HPI: Cassandra [name] is a 63 y.o. female who presents to the office today to address the problems listed above in the chief complaint. Discussed the use of AI scribe software for clinical note transcription with the patient, who gave verbal consent to proceed.  History of Present Illness Cassandra [name] is a 64 year old female with a history of liver transplant and elevated lfts who presents with sinus congestion and allergy symptoms.  She has been experiencing significant sinus congestion, coughing, sneezing, and itchy eyes for the past two to three weeks, coinciding with the allergy season. She describes 'eye fleas,' which are crusty deposits around her eyes, primarily at the bottom upon waking. She has been using Zyrtec, Patanol eye drops, and Flonase  twice daily for her allergies.  She has a history of liver issues and has been under the care of multiple specialists. There has been confusion and conflict regarding her medication management, particularly with prednisone  and Prograf , which were adjusted by her liver specialist. An increase in prednisone  to 20 mg led to high blood pressure and heart palpitations, prompting a reduction back to 5 mg. She is frustrated with her current liver specialist and mentions previous consultations and work-ups that have returned negative results for liver rejection. I have reviewed recent specialists notes and labs and documentation.   She mentions a past sinus infection treated with antibiotics, which she believes helped reduce her symptoms. She is concerned that her current symptoms may be due to a sinus infection rather than just allergies, as she experiences headaches and discomfort in her back teeth. She recalls that previous antibiotic treatment, with cefdinir , was effective.  She took this in June.   No fever but reports sinus congestion, sneezing, itchy eyes, and discomfort in her back teeth.   Assessment  1. Chronic seasonal allergic rhinitis   2. Acute recurrent maxillary sinusitis   3. History of liver transplant (HCC)   4. Immunosuppressed status (HCC)   5. Elevated liver function tests      Plan  Assessment and Plan Assessment & Plan Allergic rhinitis with conjunctivitis and possible acute sinus infection Congestion, coughing, sneezing, and crusty eyes consistent with allergic rhinitis and conjunctivitis. Headaches and back teeth discomfort suggest possible acute sinus infection. - Prescribed cefdinir  for possible sinus infection. - Added Astelin  nasal spray to current regimen. - Continued Flonase  twice daily. - Considered switching from Zyrtec to Xyzal  if symptoms persist.  Liver transplant recipient with abnormal liver function tests Liver transplant recipient with recent abnormal liver function tests. Discrepancy between liver specialist's opinion and her perception of health. Concerns about medication adjustments affecting liver function tests. - I listened to her concerns.   Elevated blood pressure today: will monitor    Follow up: as scheduled. Dec cpe No orders of the defined types were placed in this encounter.  Meds ordered this encounter  Medications   cefdinir  (OMNICEF ) 300 MG capsule    Sig: Take 1 capsule (300 mg total) by mouth 2 (two) times daily.    Dispense:  14 capsule    Refill:  0   azelastine  (ASTELIN ) 0.1 % nasal spray    Sig: Place 1 spray into both nostrils 2 (two) times daily as needed for rhinitis.    Dispense:  30 mL  Refill:  2     I reviewed the patients updated PMH, FH, and SocHx.  Patient Active Problem List   Diagnosis Date Noted   Lumbar spinal stenosis 05/10/2022    Priority: High   Diet-controlled type 2 diabetes mellitus (HCC) 08/28/2021    Priority: High   Chronic kidney disease (CKD) stage  G3a/A1, moderately decreased glomerular filtration rate (GFR) between 45-59 mL/min/1.73 square meter and albuminuria creatinine ratio less than 30 mg/g (HCC) 03/01/2021    Priority: High   Statin myopathy 05/13/2018    Priority: High   Primary insomnia 11/04/2017    Priority: High   Immunosuppressed status (HCC) 10/07/2017    Priority: High   Adenomatous polyp of colon 09/28/2017    Priority: High   Combined hyperlipidemia associated with type 2 diabetes mellitus (HCC) 03/10/2013    Priority: High   Primary osteoarthritis of knees, bilateral 03/01/2013    Priority: High   Chronic back pain 10/13/2012    Priority: High   History of liver transplant (HCC) 02/22/2009    Priority: High   Migraine with aura and without status migrainosus, not intractable 07/03/2018    Priority: Medium    Cervical facet joint syndrome 03/28/2018    Priority: Medium    Spondylosis of cervical region without myelopathy or radiculopathy 03/28/2018    Priority: Medium    Lumbar facet arthropathy 12/27/2017    Priority: Medium    Cervical stenosis of spine 01/25/2017    Priority: Medium    Fibromyalgia 08/24/2013    Priority: Medium    Hx of tuberculosis 10/13/2012    Priority: Medium    Anxiety disorder 09/16/2007    Priority: Medium    AS (sickle cell trait) (HCC) 06/29/2019    Priority: Low   Sepsis (HCC) 10/24/2023   Screening for osteoporosis 05/15/2023   Current Meds  Medication Sig   albuterol  (VENTOLIN  HFA) 108 (90 Base) MCG/ACT inhaler Inhale 2 puffs into the lungs every 6 (six) hours as needed for wheezing or shortness of breath.   azelastine  (ASTELIN ) 0.1 % nasal spray Place 1 spray into both nostrils 2 (two) times daily as needed for rhinitis.   diclofenac  Sodium (VOLTAREN ) 1 % GEL Apply 4 g topically 4 (four) times daily as needed.   fluticasone  (FLONASE ) 50 MCG/ACT nasal spray SHAKE LIQUID AND USE 2 SPRAYS IN EACH NOSTRIL DAILY   levocetirizine (XYZAL ) 5 MG tablet Take 1 tablet (5 mg  total) by mouth every evening.   magnesium  oxide (MAG-OX) 400 MG tablet Take 400 mg by mouth every evening.   olopatadine  (PATANOL) 0.1 % ophthalmic solution Place 1 drop into both eyes 2 (two) times daily.   Omega-3 Fatty Acids (FISH OIL ) 1000 MG CAPS Take 1 capsule (1,000 mg total) by mouth daily.   predniSONE  (DELTASONE ) 5 MG tablet Take 5 mg by mouth daily with breakfast.   tacrolimus  (PROGRAF ) 1 MG capsule Take 2 capsules (2 mg total) by mouth 2 (two) times daily.   [DISCONTINUED] cefdinir  (OMNICEF ) 300 MG capsule Take 1 capsule (300 mg total) by mouth 2 (two) times daily.   Allergies: Patient is allergic to erythromycin, ezetimibe, other, rofecoxib, statins, zonisamide, carvacrol, doxycycline , elavil  [amitriptyline ], metoprolol , mycophenolate  mofetil, and nsaids. Family History: Patient family history includes Alzheimer's disease in her mother; Cancer in her sister; Dementia in her mother; Diabetes in her brother and mother; Gout in her mother; Heart disease in her sister; Hypertension in her mother and sister; Stroke in her mother. Social History:  Patient  reports that she has never smoked. She has never used smokeless tobacco. She reports that she does not drink alcohol and does not use drugs.  Review of Systems: Constitutional: Negative for fever malaise or anorexia Cardiovascular: negative for chest pain Respiratory: negative for SOB or persistent cough Gastrointestinal: negative for abdominal pain  Objective  Vitals: BP 131/87 (BP Location: Left Arm, Patient Position: Sitting, Cuff Size: Large)   Pulse 84   Temp 98.1 F (36.7 C) (Temporal)   Resp 18   Wt 178 lb 3.2 oz (80.8 kg)   SpO2 100%   BMI 29.65 kg/m  General: no acute distress , A&Ox3 HEENT: PEERL, conjunctiva normal, neck is supple, c/o max sinus ttp; + nasal congestion.  Cardiovascular:  RRR without murmur or gallop.  Respiratory:  Good breath sounds bilaterally, CTAB with normal respiratory effort Skin:  Warm,  no rashes [facility] side effects, risks, benefits, and alternatives for medications and treatment plan prescribed today were discussed, and the patient expressed understanding of the given instructions. Patient is instructed to call or message via MyChart if he/she has any questions or concerns regarding our treatment plan. No barriers to understanding were identified. We discussed Red Flag symptoms and signs in detail. Patient expressed understanding regarding what to do in case of urgent or emergency type symptoms.  Medication list was reconciled, printed and provided to the patient in AVS. Patient instructions and summary information was reviewed with the patient as documented in the AVS. This note was prepared with assistance of Dragon voice recognition software. Occasional wrong-word or sound-a-like substitutions may have occurred due to the inherent limitations of voice recognition software

## 2024-01-28 ENCOUNTER — Ambulatory Visit (HOSPITAL_COMMUNITY)
Admission: RE | Admit: 2024-01-28 | Discharge: 2024-01-28 | Disposition: A | Discharge status: Home / Self Care | Source: Ambulatory Visit | Attending: Cardiovascular Disease | Admitting: Cardiovascular Disease

## 2024-01-28 DIAGNOSIS — R0609 Other forms of dyspnea: Secondary | ICD-10-CM | POA: Insufficient documentation

## 2024-01-28 LAB — ECHOCARDIOGRAM COMPLETE
Area-P 1/2: 3.37 cm2
S' Lateral: 2.1 cm

## 2024-01-30 ENCOUNTER — Ambulatory Visit: Admitting: Family Medicine

## 2024-01-31 ENCOUNTER — Other Ambulatory Visit: Payer: Self-pay

## 2024-02-04 NOTE — Progress Notes (Signed)
 See mychart note

## 2024-02-06 NOTE — Patient Outreach (Signed)
 Complex Care Management   Visit Note  01/31/2024  Name:  Cassandra [name] MRN: [id number] DOB: Mar 01, 1961  Situation: Referral received for Complex Care Management related to Immunosuppressed status/Hx of Liver TP/Elevated Liver Function Tests I obtained verbal consent from Patient.  Visit completed with Patient  on the phone  Background:   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    Ruptured disc, cervical    multiple levels   Sickle cell anemia (HCC)    Statin intolerance 05/13/2018   01/31/2024 RNCM engagement - patient states she is still working on connecting with a GI Specialist who is not associated with her previous [facility] Specialist who she no longer sees for her TP follow-up needs (had liver transplant 34 years ago and does not believe she is in rejection). States she is doing well medically and sees her PCP, Dr. Lavern Heck as her main, primary provider who knows her best and whom she trusts. Patient is committed to following up with PCP's recommendation to see GI Specialist - patient currently considering/researching Gastroenterologists @ [facility].   There were no vitals filed for this visit.  Medications Reviewed Today     Reviewed by [name], RN (Registered Nurse) on 01/31/24 at 1450  Med List Status: <None>   Medication Order Taking? Sig Documenting Provider Last Dose Status  Informant  albuterol  (VENTOLIN  HFA) 108 (90 Base) MCG/ACT inhaler [id number] Yes Inhale 2 puffs into the lungs every 6 (six) hours as needed for wheezing or shortness of breath. Wendolyn Jenkins Jansky, MD  Active Self  azelastine  (ASTELIN ) 0.1 % nasal spray [id number] Yes Place 1 spray into both nostrils 2 (two) times daily as needed for rhinitis. Heck Lavern CROME, MD  Active   cefdinir  (OMNICEF ) 300 MG capsule [id number] Yes Take 1 capsule (300 mg total) by mouth 2 (two) times daily. Heck Lavern CROME, MD  Active   diclofenac  Sodium (VOLTAREN ) 1 % GEL [id number] Yes Apply 4 g topically 4 (four) times daily as needed. Heck Lavern CROME, MD  Active Self  fluticasone  (FLONASE ) 50 MCG/ACT nasal spray [id number] Yes SHAKE LIQUID AND USE 2 SPRAYS IN EACH NOSTRIL DAILY Heck Lavern CROME, MD  Active Self  levocetirizine (XYZAL ) 5 MG tablet [id number] Yes Take 1 tablet (5 mg total) by mouth every evening. Arloa Suzen RAMAN, NP  Active Self  magnesium  oxide (MAG-OX) 400 MG tablet [id number] Yes Take 400 mg by mouth every evening. [provider]  Active Self  olopatadine  (PATANOL) 0.1 % ophthalmic solution [id number] Yes Place 1 drop into both eyes 2 (two) times daily. Allwardt, Mardy HERO, PA-C  Active Self  Omega-3 Fatty Acids (FISH OIL ) 1000 MG CAPS [id number] Yes Take 1 capsule (1,000 mg total) by mouth daily. Heck Lavern CROME, MD  Active Self  predniSONE  (DELTASONE ) 5 MG tablet  [id number] Yes Take 5 mg by mouth daily with breakfast. [provider]  Active Self  tacrolimus  (PROGRAF ) 1 MG capsule [id number] Yes Take 2 capsules (2 mg total) by mouth 2 (two) times daily. Jodie Lavern CROME, MD  Active Self            Recommendation:   Specialty provider follow-up with Gastroenterologist as soon as possible - patient considering [facility] for f/u on her liver  Follow Up Plan:   Telephone follow up appointment date/time:  02/28/2024 at 3 pm with Channing CROME, RN Care Manager.  Lanay Zinda A. Gordy RN, BA, [facility],  CRRN [place]  [facility] Population Health RN Care Manager Direct Dial: [phone]  Fax: [phone]

## 2024-02-06 NOTE — Patient Instructions (Signed)
 Visit Information  Thank you for taking time to visit with me today. Please don't hesitate to contact me if I can be of assistance to you before our next scheduled telephone appointment.  Our next appointment is by telephone on 02/28/24 at 3pm.  Following is a copy of your care plan:   Goals Addressed             This Visit's Progress    VBCI RN Care Plan       Problems:  Chronic Disease Management support and education needs related to CKD Stage 3A  Goal: Over the next 4 months the Patient will continue to work with RN Care Manager and/or Social Worker to address care management and care coordination needs related to CKD Stage 3A as evidenced by adherence to care management team scheduled appointments      Interventions:    Chronic Kidney Disease Interventions: Evaluation of current treatment plan related to chronic kidney disease self management and patient's adherence to plan as established by provider      Reviewed medications with patient and discussed importance of compliance    Counseled on the importance of exercise goals with target of 150 minutes per week     Advised patient, providing education and rationale, to monitor blood pressure daily and record, calling PCP for findings outside established parameters    Discussed complications of poorly controlled blood pressure such as heart disease, stroke, circulatory complications, vision complications, kidney impairment, sexual dysfunction    Reviewed scheduled/upcoming provider appointments including    Discussed plans with patient for ongoing care management follow up and provided patient with direct contact information for care management team    Screening for signs and symptoms of depression related to chronic disease state      Assessed social determinant of health barriers    Last practice recorded BP readings:  BP Readings from Last 3 Encounters:  12/16/23 135/86  11/28/23 110/74  11/07/23 106/74   Most recent  eGFR/CrCl: No results found for: EGFR  No components found for: CRCL  Patient Self-Care Activities:  Attend all scheduled provider appointments Call pharmacy for medication refills 3-7 days in advance of running out of medications Call provider office for new concerns or questions  Perform all self care activities independently  Perform IADL's (shopping, preparing meals, housekeeping, managing finances) independently Take medications as prescribed   Work with the social worker to address care coordination needs and will continue to work with the clinical team to address health care and disease management related needs  Plan:  The patient has been provided with contact information for the care management team and has been advised to call with any health related questions or concerns.  Next tele-appt with RN Care Manager Channing CROME is scheduled for 02/28/24 at 3pm.           VBCI RN Care Plan       Problems:  Chronic Disease Management support and education needs related to Immunosuppressed Status d/t History of Liver Transplant  Goal: Over the next 4 months the Patient will continue to work with RN Care Manager and/or Social Worker to address care management and care coordination needs related to Immunosuppressed Status d/t History of Liver Transplant as evidenced by adherence to care management team scheduled appointments      Interventions:   Evaluation of current treatment plan related to Immunosuppressed Status d/t History of Liver Transplant,  self-management and patient's adherence to plan as established by provider. Discussed plans  with patient for ongoing care management follow up and provided patient with direct contact information for care management team Discussed plans with patient for ongoing care management follow up and provided patient with direct contact information for care management team Screening for signs and symptoms of depression related to chronic disease  state  Assessed social determinant of health barriers  Patient Self-Care Activities:  Attend all scheduled provider appointments Call pharmacy for medication refills 3-7 days in advance of running out of medications Call provider office for new concerns or questions  Perform all self care activities independently  Perform IADL's (shopping, preparing meals, housekeeping, managing finances) independently Take medications as prescribed    Plan:  The patient has been provided with contact information for the care management team and has been advised to call with any health related questions or concerns.  Next appointment with RN Care Manager Gloria Ricardo L. on 02/28/2024 at 3 pm             Patient verbalizes understanding of instructions and care plan provided today and agrees to view in MyChart. Active MyChart status and patient understanding of how to access instructions and care plan via MyChart confirmed with patient.     The patient has been provided with contact information for the care management team and has been advised to call with any health related questions or concerns.   Please call the care guide team at [phone] if you need to cancel or reschedule your appointment.   Please call 1-800-273-TALK (toll free, 24 hour hotline) if you are experiencing a Mental Health or Behavioral Health Crisis or need someone to talk to.  Garcia Dalzell A. Gordy RN, BA, [facility], CRRN [place]  [facility] Population Health RN Care Manager Direct Dial: [phone]  Fax: [phone]

## 2024-02-20 DIAGNOSIS — Z9482 Intestine transplant status: Secondary | ICD-10-CM | POA: Diagnosis not present

## 2024-02-20 DIAGNOSIS — D849 Immunodeficiency, unspecified: Secondary | ICD-10-CM | POA: Diagnosis not present

## 2024-02-28 ENCOUNTER — Other Ambulatory Visit: Payer: Self-pay

## 2024-03-04 DIAGNOSIS — T8641 Liver transplant rejection: Secondary | ICD-10-CM | POA: Diagnosis not present

## 2024-03-04 DIAGNOSIS — Z944 Liver transplant status: Secondary | ICD-10-CM | POA: Diagnosis not present

## 2024-03-05 ENCOUNTER — Other Ambulatory Visit: Payer: Self-pay

## 2024-03-10 ENCOUNTER — Other Ambulatory Visit: Payer: Self-pay

## 2024-03-11 ENCOUNTER — Telehealth: Payer: Self-pay | Admitting: Gastroenterology

## 2024-03-11 ENCOUNTER — Telehealth: Payer: Self-pay | Admitting: Family Medicine

## 2024-03-11 NOTE — Telephone Encounter (Signed)
 Request received to transfer GI care from outside practice to [facility].  We appreciate the interest in our practice, however at this time we cannot accommodate this transfe

## 2024-03-11 NOTE — Telephone Encounter (Signed)
 Patient called in regards to appointment. States she didn't get a call yesterday but wanted to reschedule. Please call patient when able.

## 2024-03-11 NOTE — Telephone Encounter (Signed)
 Good afternoon Dr. Shila,   Patient is wishing to be seen for elevated liver enzymes. Patient was seen with [facility] in 2022. Patient then went to [facility] and was last seen July 2025. Patient is requesting to transfer her care back to you. States her provider at [facility] stated that it is the patient's fault that she is having liver complications due to lowering a dosage on a medication. Patient stated she lower dosage due to not being able to tolerate the high dosage. States she does not agree with the recommendation such as ERCP due to being advised before this was not needed but was told it is needed now. Please review and advise further on scheduling.   Thank you.

## 2024-03-13 ENCOUNTER — Other Ambulatory Visit: Payer: Self-pay

## 2024-03-15 NOTE — Patient Instructions (Signed)
 Visit Information  Thank you for taking time to visit with me today. Please don't hesitate to contact me if I can be of assistance to you before our next scheduled telephone appointment.  Our next appointment is by telephone on 03/26/24 at 10 am  Following is a copy of your care plan:   Goals Addressed             This Visit's Progress    VBCI RN Care Plan       Problems:  Chronic Disease Management support and education needs related to CKD Stage 3A  Goal: Over the next 4 months the Patient will continue to work with RN Care Manager and/or Social Worker to address care management and care coordination needs related to CKD Stage 3A as evidenced by adherence to care management team scheduled appointments      Interventions:    Chronic Kidney Disease Interventions: Evaluation of current treatment plan related to chronic kidney disease self management and patient's adherence to plan as established by provider      Reviewed medications with patient and discussed importance of compliance    Counseled on the importance of exercise goals with target of 150 minutes per week     Advised patient, providing education and rationale, to monitor blood pressure daily and record, calling PCP for findings outside established parameters    Discussed complications of poorly controlled blood pressure such as heart disease, stroke, circulatory complications, vision complications, kidney impairment, sexual dysfunction    Reviewed scheduled/upcoming provider appointments including    Discussed plans with patient for ongoing care management follow up and provided patient with direct contact information for care management team    Screening for signs and symptoms of depression related to chronic disease state      Assessed social determinant of health barriers    Last practice recorded BP readings:  BP Readings from Last 3 Encounters:  12/16/23 135/86  11/28/23 110/74  11/07/23 106/74   Most recent  eGFR/CrCl: No results found for: EGFR  No components found for: CRCL  Patient Self-Care Activities:  Attend all scheduled provider appointments Call pharmacy for medication refills 3-7 days in advance of running out of medications Call provider office for new concerns or questions  Perform all self care activities independently  Perform IADL's (shopping, preparing meals, housekeeping, managing finances) independently Take medications as prescribed   Work with the social worker to address care coordination needs and will continue to work with the clinical team to address health care and disease management related needs  Plan:  The patient has been provided with contact information for the care management team and has been advised to call with any health related questions or concerns.  Next tele-appt with RN Care Manager Channing CROME is scheduled for 03/26/24 at 10 am.           VBCI RN Care Plan       Problems:  Chronic Disease Management support and education needs related to Immunosuppressed Status d/t History of Liver Transplant  Goal: Over the next 4 months the Patient will continue to work with RN Care Manager and/or Social Worker to address care management and care coordination needs related to Immunosuppressed Status d/t History of Liver Transplant as evidenced by adherence to care management team scheduled appointments      Interventions:   Evaluation of current treatment plan related to Immunosuppressed Status d/t History of Liver Transplant,  self-management and patient's adherence to plan as established by provider.  Discussed plans with patient for ongoing care management follow up and provided patient with direct contact information for care management team Discussed plans with patient for ongoing care management follow up and provided patient with direct contact information for care management team Screening for signs and symptoms of depression related to chronic disease  state  Assessed social determinant of health barriers  Patient Self-Care Activities:  Attend all scheduled provider appointments Call pharmacy for medication refills 3-7 days in advance of running out of medications Call provider office for new concerns or questions  Perform all self care activities independently  Perform IADL's (shopping, preparing meals, housekeeping, managing finances) independently Take medications as prescribed    Plan:  The patient has been provided with contact information for the care management team and has been advised to call with any health related questions or concerns.  Next appointment with RN Care Manager Herby Amick L. on 03/26/2024 at 10 am             Patient verbalizes understanding of instructions and care plan provided today and agrees to view in MyChart. Active MyChart status and patient understanding of how to access instructions and care plan via MyChart confirmed with patient.     The patient has been provided with contact information for the care management team and has been advised to call with any health related questions or concerns.   Please call the care guide team at [phone] if you need to cancel or reschedule your appointment.   Please call 1-800-273-TALK (toll free, 24 hour hotline) if you are experiencing a Mental Health or Behavioral Health Crisis or need someone to talk to.  Skylene Deremer A. Gordy RN, BA, [facility], CRRN [place]  [facility] Population Health RN Care Manager Direct Dial: [phone]  Fax: [phone]

## 2024-03-15 NOTE — Patient Outreach (Signed)
 Complex Care Management   Visit Note  03/15/2024  Name:  Cassandra [name] MRN: [id number] DOB: 1961/05/12  Situation: Referral received for Complex Care Management related to Immunocompromised d/t hx of liver TP >30 yrs ago and continued need for anti-rejection meds. I obtained verbal consent from Patient.  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   Qualifier: Diagnosis of  By: [name], [name]     Migraine    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    Ruptured disc, cervical    multiple levels   Sickle cell anemia (HCC)    Statin intolerance 05/13/2018    Assessment: Patient Reported Symptoms:  Cognitive Cognitive Status: Normal speech and language skills, Alert and oriented to person, place, and time, Insightful and able to interpret abstract concepts Cognitive/Intellectual Conditions Management [RPT]: None reported or documented in medical history or problem list   Health Maintenance Behaviors: Annual physical exam, Healthy diet, Spiritual practice(s) Health Facilitated by: Healthy diet, Prayer/meditation, Rest, Stress management  Neurological Neurological Review of Symptoms: No symptoms reported    HEENT HEENT Symptoms Reported: No symptoms reported      Cardiovascular Cardiovascular Symptoms Reported: No symptoms reported    Respiratory Respiratory Symptoms Reported: No symptoms reported    Endocrine Endocrine Symptoms Reported: No symptoms reported     Gastrointestinal        Genitourinary      Integumentary Integumentary Symptoms Reported: No symptoms reported    Musculoskeletal Musculoskelatal Symptoms Reviewed: No symptoms reported        Psychosocial Psychosocial Symptoms Reported: No symptoms reported     Quality of Family Relationships: supportive, helpful, involved Do you feel physically threatened by others?: No    03/15/2024    PHQ2-9 Depression Screening   Little interest or pleasure in doing things    Feeling down, depressed, or hopeless    PHQ-2 - Total Score    Trouble falling or staying asleep, or sleeping too much    Feeling tired or having little energy    Poor appetite or overeating     Feeling bad about yourself - or that you are a failure or have let yourself or your family down    Trouble concentrating on things, such as reading the newspaper or watching television    Moving or speaking so slowly that other people could have noticed.  Or the opposite - being so fidgety or restless that you have been moving around a lot more than usual    Thoughts that you would be better off dead, or hurting yourself in some way    PHQ2-9 Total Score    If you checked off any problems, how difficult have these problems made it for you to do your work, take care of things at home, or get along with other people    Depression Interventions/Treatment      There were no vitals filed for  this visit.  Medications Reviewed Today     Reviewed by [name], RN (Registered Nurse) on 03/13/24 at 1002  Med List Status: <None>   Medication Order Taking? Sig Documenting Provider Last Dose Status Informant  albuterol  (VENTOLIN  HFA) 108 (90 Base) MCG/ACT inhaler [id number] Yes Inhale 2 puffs into the lungs every 6 (six) hours as needed for wheezing or shortness of breath. Wendolyn Jenkins Jansky, MD  Active Self  azelastine  (ASTELIN ) 0.1 % nasal spray [id number] Yes Place 1 spray into both nostrils 2 (two) times daily as needed for  rhinitis. Jodie Lavern CROME, MD  Active   cefdinir  (OMNICEF ) 300 MG capsule [id number] Yes Take 1 capsule (300 mg total) by mouth 2 (two) times daily. Jodie Lavern CROME, MD  Active   diclofenac  Sodium (VOLTAREN ) 1 % GEL [id number] Yes Apply 4 g topically 4 (four) times daily as needed. Jodie Lavern CROME, MD  Active Self  fluticasone  (FLONASE ) 50 MCG/ACT nasal spray [id number] Yes SHAKE LIQUID AND USE 2 SPRAYS IN EACH NOSTRIL DAILY Jodie Lavern CROME, MD  Active Self  levocetirizine (XYZAL ) 5 MG tablet [id number] Yes Take 1 tablet (5 mg total) by mouth every evening. Arloa Suzen RAMAN, NP  Active Self  magnesium  oxide (MAG-OX) 400 MG tablet [id number] Yes Take 400 mg by mouth every evening. [provider]  Active Self  olopatadine  (PATANOL) 0.1 % ophthalmic solution [id number] Yes Place 1 drop into both eyes 2 (two) times daily. Allwardt, Mardy HERO, PA-C  Active Self  Omega-3 Fatty Acids (FISH OIL ) 1000 MG CAPS [id number] Yes Take 1 capsule (1,000 mg total) by mouth daily. Jodie Lavern CROME, MD  Active Self  predniSONE  (DELTASONE ) 5 MG tablet [id number] Yes Take 5 mg by mouth daily with breakfast. [provider]  Active Self  tacrolimus  (PROGRAF ) 1 MG capsule [id number] Yes Take 2 capsules (2 mg total) by mouth 2 (two) times daily. Jodie Lavern CROME, MD  Active Self            Recommendation:   Continue Current Plan of Care  Follow Up Plan:   Telephone follow up appointment date/time:  03/26/2024 at 10am with Channing CROME RN Care Manager   Shalayna Ornstein A. Gordy RN, BA, [facility], CRRN [place]  [facility] Population Health RN Care Manager Direct Dial: [phone]  Fax: [phone]

## 2024-03-17 NOTE — Progress Notes (Signed)
 Cassandra [name]                                          MRN: [id number]   03/17/2024   The VBCI Quality Team Specialist reviewed this patient medical record for the purposes of chart review for care gap closure. The following were reviewed: chart review for care gap closure-kidney health evaluation for diabetes:eGFR  and uACR.    VBCI Quality Team

## 2024-03-18 NOTE — Telephone Encounter (Signed)
 Patient has been advised

## 2024-03-20 DIAGNOSIS — Z944 Liver transplant status: Secondary | ICD-10-CM | POA: Diagnosis not present

## 2024-03-20 DIAGNOSIS — T8641 Liver transplant rejection: Secondary | ICD-10-CM | POA: Diagnosis not present

## 2024-03-26 ENCOUNTER — Other Ambulatory Visit: Payer: Self-pay

## 2024-04-03 ENCOUNTER — Other Ambulatory Visit: Payer: Self-pay

## 2024-04-03 NOTE — Patient Outreach (Signed)
 Complex Care Management   Visit Note  04/03/2024  Name:  Cassandra [name] MRN: [id number] DOB: 1960/06/21  Situation: Referral received for Complex Care Management related to Immunosuppressed Status of History of Liver Transplant I obtained verbal consent from Patient.  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   Qualifier: Diagnosis of  By: [name], [name]     Migraine    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    Ruptured disc, cervical    multiple levels   Sickle cell anemia (HCC)    Statin intolerance 05/13/2018    Assessment: Patient Reported Symptoms:  Cognitive Cognitive Status: Normal speech and language skills, Alert and oriented to person, place, and time, Insightful and able to interpret abstract concepts Cognitive/Intellectual Conditions Management [RPT]: None reported or documented in medical history or problem list   Health Maintenance Behaviors: Annual physical exam, Healthy diet, Spiritual practice(s) Healing Pattern: Unsure Health Facilitated by: Healthy diet, Prayer/meditation, Rest, Stress management  Neurological Neurological Review of Symptoms: No symptoms reported    HEENT HEENT Symptoms Reported: No symptoms reported      Cardiovascular Cardiovascular Symptoms Reported: No symptoms reported Cardiovascular Self-Management Outcome: 4 (good)  Respiratory Respiratory Symptoms Reported: No symptoms reported    Endocrine Endocrine Symptoms Reported:  No symptoms reported Is patient diabetic?: Yes Is patient checking blood sugars at home?: No Endocrine Self-Management Outcome: 4 (good)  Gastrointestinal Gastrointestinal Symptoms Reported: No symptoms reported      Genitourinary Genitourinary Symptoms Reported: No symptoms reported Genitourinary Management Strategies: Coping strategies Genitourinary Self-Management Outcome: 4 (good)  Integumentary Integumentary Symptoms Reported: No symptoms reported    Musculoskeletal Musculoskelatal Symptoms Reviewed: No symptoms reported        Psychosocial Psychosocial Symptoms Reported: No symptoms reported     Quality of Family Relationships: supportive, helpful, involved Do you feel physically threatened by others?: No    04/03/2024    PHQ2-9 Depression Screening   Little interest or pleasure in doing things Not at all  Feeling down, depressed, or hopeless Not at all  PHQ-2 - Total Score 0  Trouble falling or staying asleep, or sleeping too much    Feeling tired or having little energy    Poor appetite or overeating     Feeling bad about yourself - or that you are a failure or have let yourself or your family down    Trouble concentrating on things, such as reading the newspaper or watching television    Moving or speaking so slowly that other people could have noticed.  Or the opposite - being so fidgety or restless that you have been moving around a lot more than usual    Thoughts that you would be better off dead, or hurting yourself in some way    PHQ2-9 Total Score    If you checked off any problems, how difficult have these problems made it for you to do  your work, take care of things at home, or get along with other people    Depression Interventions/Treatment      There were no vitals filed for this visit.  Medications Reviewed Today     Reviewed by [name], RN (Registered Nurse) on 04/03/24 at 1025  Med List Status: <None>   Medication Order Taking? Sig  Documenting Provider Last Dose Status Informant  albuterol  (VENTOLIN  HFA) 108 (90 Base) MCG/ACT inhaler [id number] Yes Inhale 2 puffs into the lungs every 6 (six) hours as needed for wheezing or shortness of breath. Wendolyn Jenkins Jansky, MD  Active Self  azelastine  (ASTELIN ) 0.1 % nasal spray [id number] Yes Place 1 spray into both nostrils 2 (two) times daily as needed for rhinitis. Jodie Lavern CROME, MD  Active   cefdinir  (OMNICEF ) 300 MG capsule [id number] Yes Take 1 capsule (300 mg total) by mouth 2 (two) times daily. Jodie Lavern CROME, MD  Active   diclofenac  Sodium (VOLTAREN ) 1 % GEL [id number] Yes Apply 4 g topically 4 (four) times daily as needed. Jodie Lavern CROME, MD  Active Self  fluticasone  (FLONASE ) 50 MCG/ACT nasal spray [id number] Yes SHAKE LIQUID AND USE 2 SPRAYS IN EACH NOSTRIL DAILY Jodie Lavern CROME, MD  Active Self  levocetirizine (XYZAL ) 5 MG tablet [id number] Yes Take 1 tablet (5 mg total) by mouth every evening. Arloa Suzen RAMAN, NP  Active Self  magnesium  oxide (MAG-OX) 400 MG tablet [id number] Yes Take 400 mg by mouth every evening. [provider]  Active Self  olopatadine  (PATANOL) 0.1 % ophthalmic solution [id number] Yes Place 1 drop into both eyes 2 (two) times daily. Allwardt, Mardy HERO, PA-C  Active Self  Omega-3 Fatty Acids (FISH OIL ) 1000 MG CAPS [id number] Yes Take 1 capsule (1,000 mg total) by mouth daily. Jodie Lavern CROME, MD  Active Self  predniSONE  (DELTASONE ) 5 MG tablet [id number] Yes Take 5 mg by mouth daily with breakfast. [provider]  Active Self  tacrolimus  (PROGRAF ) 1 MG capsule [id number] Yes Take 2 capsules (2 mg total) by mouth 2 (two) times daily. Jodie Lavern CROME, MD  Active Self            Recommendation:   Continue Current Plan of Care  Follow Up Plan:   Patient has met all care management goals. Care Management case will be closed. Patient has been provided contact information should new needs arise.    Samuell Knoble A. Gordy RN, BA, [facility],  CRRN [place]  [facility] Population Health RN Care Manager Direct Dial: [phone]  Fax: [phone]

## 2024-04-03 NOTE — Patient Instructions (Signed)
 Visit Information  Thank you for taking time to visit with me today. Please don't hesitate to contact me if I can be of assistance to you before our next scheduled telephone appointment.  Our next appointment is by telephone on 04/24/24 at 10am  Following is a copy of your care plan:   Goals Addressed             This Visit's Progress    VBCI RN Care Plan       Problems:  Chronic Disease Management support and education needs related to Immunosuppressed Status d/t History of Liver Transplant  Goal: Over the next 3 months the Patient will continue to work with RN Care Manager and/or Social Worker to address care management and care coordination needs related to Immunosuppressed Status d/t History of Liver Transplant as evidenced by adherence to care management team scheduled appointments      Interventions:   Evaluation of current treatment plan related to Immunosuppressed Status d/t History of Liver Transplant,  self-management and patient's adherence to plan as established by provider. Discussed plans with patient for ongoing care management follow up and provided patient with direct contact information for care management team Discussed plans with patient for ongoing care management follow up and provided patient with direct contact information for care management team Screening for signs and symptoms of depression related to chronic disease state  Assessed social determinant of health barriers  Patient Self-Care Activities:  Attend all scheduled provider appointments Call pharmacy for medication refills 3-7 days in advance of running out of medications Call provider office for new concerns or questions  Perform all self care activities independently  Perform IADL's (shopping, preparing meals, housekeeping, managing finances) independently Take medications as prescribed    Plan:  The patient has been provided with contact information for the care management team and has been  advised to call with any health related questions or concerns.  Next appointment with RN Care Manager Dayane Hillenburg L. on 04/24/2024 at 10 am             Care plan and visit instructions communicated with the patient verbally today. Patient agrees to receive a copy in MyChart. Active MyChart status and patient understanding of how to access instructions and care plan via MyChart confirmed with patient.     The patient has been provided with contact information for the care management team and has been advised to call with any health related questions or concerns.   Please call the care guide team at [phone] if you need to cancel or reschedule your appointment.   Please call 1-800-273-TALK (toll free, 24 hour hotline) if you are experiencing a Mental Health or Behavioral Health Crisis or need someone to talk to.   Nahima Ales A. Gordy RN, BA, [facility], CRRN [place]  [facility] Population Health RN Care Manager Direct Dial: [phone]  Fax: [phone]

## 2024-04-22 NOTE — Progress Notes (Signed)
 DE LIBMAN                                          MRN: [id number]   04/22/2024   The VBCI Quality Team Specialist reviewed this patient medical record for the purposes of chart review for care gap closure. The following were reviewed: chart review for care gap closure-kidney health evaluation for diabetes:eGFR  and uACR.    VBCI Quality Team

## 2024-04-24 ENCOUNTER — Other Ambulatory Visit: Payer: Self-pay

## 2024-04-26 NOTE — Patient Outreach (Signed)
 04/24/2024   Called patient at scheduled telephone apptointment time of 03/24/24 at 10 am but after speaking to patient for a brief time, both RNCM and patient chose to move appointment forward to 05/08/24 at 10am. Patient stated she was feeling fairly well and stable (reported no notable changes in health status) but was in need of additional rest at time of call and felt she needed to go back to sleep.  Next appointment scheduled to 05/08/24 at 10am.  Rhina Kramme A. Gordy RN, BA, [facility], CRRN [place]  [facility] Population Health RN Care Manager Direct Dial: [phone]  Fax: [phone]

## 2024-05-08 ENCOUNTER — Other Ambulatory Visit

## 2024-05-12 ENCOUNTER — Other Ambulatory Visit: Payer: Self-pay

## 2024-05-12 NOTE — Patient Outreach (Signed)
 Complex Care Management   Visit Note  05/12/2024  Name:  Cassandra [name] MRN: [id number] DOB: 1960-12-03  Situation: Referral received for Complex Care Management related to Chronic Kidney Disease I obtained verbal consent from Patient.  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   Qualifier: Diagnosis of  By: [name], [name]     Migraine    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    Ruptured disc, cervical    multiple levels   Sickle cell anemia (HCC)    Statin intolerance 05/13/2018    Assessment: Patient Reported Symptoms:  Cognitive Cognitive Status: No symptoms reported   Health Maintenance Behaviors: Annual physical exam Healing Pattern: Average  Neurological Neurological Review of Symptoms: No symptoms reported Neurological Management Strategies: Routine screening Neurological Self-Management Outcome: 4 (good)  HEENT HEENT Symptoms Reported: No symptoms reported HEENT Management Strategies: Routine screening HEENT Self-Management Outcome: 4 (good)    Cardiovascular Cardiovascular Symptoms Reported: No symptoms reported Does patient have uncontrolled Hypertension?: No Cardiovascular Management Strategies: Routine screening Cardiovascular Self-Management Outcome: 4 (good)  Respiratory Respiratory Symptoms Reported: No symptoms reported Respiratory Management Strategies: Routine screening Respiratory Self-Management Outcome: 4 (good)  Endocrine Endocrine  Symptoms Reported: No symptoms reported Is patient diabetic?: Yes Is patient checking blood sugars at home?: No Endocrine Self-Management Outcome: 4 (good) Endocrine Comment: no longer checks  Gastrointestinal Gastrointestinal Symptoms Reported: No symptoms reported Gastrointestinal Management Strategies: Adequate rest Gastrointestinal Self-Management Outcome: 3 (uncertain)    Genitourinary Genitourinary Symptoms Reported: No symptoms reported Genitourinary Management Strategies: Adequate rest Genitourinary Self-Management Outcome: 4 (good) Genitourinary Comment: no uti symptoms  Integumentary Integumentary Symptoms Reported: No symptoms reported Skin Management Strategies: Adequate rest, Routine screening Skin Self-Management Outcome: 4 (good)  Musculoskeletal Musculoskelatal Symptoms Reviewed: No symptoms reported Musculoskeletal Management Strategies: Adequate rest, Routine screening Musculoskeletal Self-Management Outcome: 4 (good) Falls in the past year?: No Number of falls in past year: 1 or less Was there an injury with Fall?: No Fall Risk Category Calculator: 0 Patient Fall Risk Level: Low Fall Risk Patient at Risk for Falls Due to: No Fall Risks Fall risk Follow up: Education provided, Falls prevention discussed  Psychosocial Psychosocial Symptoms Reported: No symptoms reported Behavioral Management Strategies: Coping strategies Behavioral Health Self-Management Outcome: 4 (good) Major Change/Loss/Stressor/Fears (CP): Denies Techniques to Cope with Loss/Stress/Change: Spiritual practice(s) Quality of Family Relationships: supportive, involved Do you feel physically threatened by others?: No    05/12/2024    PHQ2-9 Depression Screening   Little interest or pleasure in doing things Not at all  Feeling down, depressed, or hopeless Not at all  PHQ-2 - Total Score 0  Trouble falling or staying asleep, or sleeping too much    Feeling tired or having little energy    Poor  appetite or overeating     Feeling bad about yourself - or that you are a failure or have let yourself or your family down  Trouble concentrating on things, such as reading the newspaper or watching television    Moving or speaking so slowly that other people could have noticed.  Or the opposite - being so fidgety or restless that you have been moving around a lot more than usual    Thoughts that you would be better off dead, or hurting yourself in some way    PHQ2-9 Total Score    If you checked off any problems, how difficult have these problems made it for you to do your work, take care of things at home, or get along with other people    Depression Interventions/Treatment      There were no vitals filed for this visit. Pain Score: 0-No pain  Medications Reviewed Today     Reviewed by Nivia, Camdyn Beske , RN (Registered Nurse) on 05/12/24 at 1006  Med List Status: <None>   Medication Order Taking? Sig Documenting Provider Last Dose Status Informant  albuterol  (VENTOLIN  HFA) 108 (90 Base) MCG/ACT inhaler [id number]  Inhale 2 puffs into the lungs every 6 (six) hours as needed for wheezing or shortness of breath.  Patient not taking: Reported on 05/12/2024   Wendolyn Jenkins Jansky, MD  Active Self  azelastine  (ASTELIN ) 0.1 % nasal spray [id number] Yes Place 1 spray into both nostrils 2 (two) times daily as needed for rhinitis. Jodie Lavern CROME, MD  Active   cefdinir  (OMNICEF ) 300 MG capsule [id number]  Take 1 capsule (300 mg total) by mouth 2 (two) times daily.  Patient not taking: Reported on 05/12/2024   Jodie Lavern CROME, MD  Active   diclofenac  Sodium (VOLTAREN ) 1 % GEL [id number] Yes Apply 4 g topically 4 (four) times daily as needed. Jodie Lavern CROME, MD  Active Self  fluticasone  (FLONASE ) 50 MCG/ACT nasal spray [id number]  SHAKE LIQUID AND USE 2 SPRAYS IN EACH NOSTRIL DAILY  Patient not taking: Reported on 05/12/2024   Jodie Lavern CROME, MD  Active Self  levocetirizine (XYZAL ) 5 MG tablet [id number]   Take 1 tablet (5 mg total) by mouth every evening.  Patient not taking: Reported on 05/12/2024   Arloa Suzen RAMAN, NP  Active Self  magnesium  oxide (MAG-OX) 400 MG tablet [id number] Yes Take 400 mg by mouth every evening. [provider]  Active Self  olopatadine  (PATANOL) 0.1 % ophthalmic solution [id number] Yes Place 1 drop into both eyes 2 (two) times daily. Allwardt, Mardy HERO, PA-C  Active Self  Omega-3 Fatty Acids (FISH OIL ) 1000 MG CAPS [id number] Yes Take 1 capsule (1,000 mg total) by mouth daily. Jodie Lavern CROME, MD  Active Self  predniSONE  (DELTASONE ) 5 MG tablet [id number] Yes Take 5 mg by mouth daily with breakfast. [provider]  Active Self  tacrolimus  (PROGRAF ) 1 MG capsule [id number] Yes Take 2 capsules (2 mg total) by mouth 2 (two) times daily. Jodie Lavern CROME, MD  Active Self            Recommendation:   Continue Current Plan of Care  Follow Up Plan:   Telephone follow-up in 1 month  Jacqueli Pangallo RN RN Care Manager [facility] [phone]

## 2024-05-12 NOTE — Patient Instructions (Signed)
 Visit Information  Thank you for taking time to visit with me today. Please don't hesitate to contact me if I can be of assistance to you before our next scheduled appointment.  Your next care management appointment is by telephone on 06/13/23 at 10:00am  Telephone follow-up in 1 month  Please call the care guide team at [phone] if you need to cancel, schedule, or reschedule an appointment.   Please call the Suicide and Crisis Lifeline: 988 call the [place]  National Suicide Prevention Lifeline: 3255287921 or TTY: 234-024-5072 TTY 5802105830) to talk to a trained counselor call 1-800-273-TALK (toll free, 24 hour hotline) if you are experiencing a Mental Health or Behavioral Health Crisis or need someone to talk to.  Avice Funchess RN RN Care Manager [facility] [phone]

## 2024-05-25 NOTE — Progress Notes (Signed)
 Cassandra [name]                                          MRN: [id number]   05/25/2024   The [facility] Quality Team Specialist reviewed this patient medical record for the purposes of chart review for care gap closure. The following were reviewed: chart review for care gap closure-kidney health evaluation for diabetes:eGFR  and uACR.    [facility] Quality Team

## 2024-06-02 ENCOUNTER — Encounter: Payer: Self-pay | Admitting: Family Medicine

## 2024-06-02 NOTE — Telephone Encounter (Signed)
 Forms will be placed up front for pt to come in and fill out her portion

## 2024-06-05 ENCOUNTER — Telehealth: Payer: Self-pay | Admitting: Family Medicine

## 2024-06-05 ENCOUNTER — Encounter: Payer: Self-pay | Admitting: Internal Medicine

## 2024-06-05 ENCOUNTER — Ambulatory Visit: Admitting: Internal Medicine

## 2024-06-05 VITALS — BP 128/80 | HR 80 | Temp 98.7°F | Ht 65.0 in | Wt 175.0 lb

## 2024-06-05 DIAGNOSIS — R21 Rash and other nonspecific skin eruption: Secondary | ICD-10-CM | POA: Diagnosis not present

## 2024-06-05 MED ORDER — METHYLPREDNISOLONE ACETATE 80 MG/ML IJ SUSP
80.0000 mg | Freq: Once | INTRAMUSCULAR | Status: AC
  Administered 2024-06-05: 80 mg via INTRAMUSCULAR

## 2024-06-05 MED ORDER — METHYLPREDNISOLONE 4 MG PO TBPK: ORAL_TABLET | ORAL | 0 refills | Status: AC

## 2024-06-05 MED ORDER — TRIAMCINOLONE ACETONIDE 0.1 % EX CREA: 1.0000 | TOPICAL_CREAM | Freq: Two times a day (BID) | CUTANEOUS | 1 refills | Status: AC

## 2024-06-05 NOTE — Addendum Note (Signed)
 Addended by: WONDA BETTER on: 06/05/2024 11:21 AM   Modules accepted: Orders

## 2024-06-05 NOTE — Patient Instructions (Signed)
 You had the steroid shot today  Please take all new medication as prescribed  - the cream for the most itchy areas, and the medrol  pak instead of the prednisone   Please continue all other medications as before, and refills have been done if requested.  Please have the pharmacy call with any other refills you may need.  Please keep your appointments with your specialists as you may have planned

## 2024-06-05 NOTE — Assessment & Plan Note (Signed)
 C/w dermatitis vs hives - for depomedrol 80 mg IM, medrol  pack, and triam cr prn most itchy areas, and consider allergy referral if recurs.

## 2024-06-05 NOTE — Progress Notes (Signed)
 Patient ID: Cassandra [name], female   DOB: 1960-11-12, 64 y.o.   MRN: [id number]

## 2024-06-05 NOTE — Telephone Encounter (Signed)
 Type of form received:PCP verification, medical release form, and disability parking placard.  Additional comments:   Received by: handed to Therisa after it had been filled out in lobby  Form should be Faxed to: [phone]  Form should be mailed to:    Is patient requesting call for pickup: yes for one sheet    Form placed:  pcp box  Attach charge sheet.   Individual made aware of 3-5 business day turn around (Y/N)? Yes

## 2024-06-05 NOTE — Progress Notes (Signed)
 Patient ID: Cassandra [name], female   DOB: 12/16/1960, 64 y.o.   MRN: [id number]        Chief Complaint: follow up rash       HPI:  Cassandra [name] is a 64 y.o. female here with c/o 1 wk onset diffuse random marked itchy spots of erythema, nontender, no fever but can't stop scratching, no obvious med or environmental source it seems, no sick contacts.  Did have something similar a few years ago tx with steroid.  Pt declines prednisone  as this increases her BP  Pt denies chest pain, increased sob or doe, wheezing, orthopnea, PND, increased LE swelling, palpitations, dizziness or syncope.  Pt denies polydipsia, polyuria, or new focal neuro s/s.         Wt Readings from Last 3 Encounters:  06/05/24 175 lb (79.4 kg)  01/27/24 178 lb 3.2 oz (80.8 kg)  12/16/23 173 lb 12.8 oz (78.8 kg)   BP Readings from Last 3 Encounters:  06/05/24 128/80  01/27/24 131/87  12/16/23 135/86         Past Medical History:  Diagnosis Date   Anxiety    AS (sickle cell trait) 06/29/2019   Autoimmune hepatitis (HCC)    Chronic renal insufficiency    Cirrhosis (HCC)    COLONIC POLYPS, ADENOMATOUS, HX OF 05/05/2008   Qualifier: Diagnosis of  By: Marcelo CMA (AAMA), [name]     Depression    External hemorrhoids    Fibromyalgia    GERD (gastroesophageal reflux disease)    Hyperlipidemia    Internal hemorrhoids    Iron deficiency anemia    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   Qualifier: Diagnosis of  By: [name], [name]     Migraine    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 She has never used smokeless tobacco. She reports that she does not drink alcohol and does not  use drugs. family history includes Alzheimer's disease in her mother; Cancer in her sister; Dementia in her mother; Diabetes in her brother and mother; Gout in her mother; Heart disease in her sister; Hypertension in her mother and sister; Stroke in her mother. Allergies[1] Medications Ordered Prior to Encounter[2]      ROS:  All others reviewed and negative.  Objective        PE:  BP 128/80 (BP Location: Right Arm, Patient Position: Sitting, Cuff Size: Normal)   Pulse 80   Temp 98.7 F (37.1 C) (Oral)   Ht 5' 5 (1.651 m)   Wt 175 lb (79.4 kg)   SpO2 100%   BMI 29.12 kg/m                 Constitutional: Pt appears in NAD               HENT: Head: NCAT.                Right Ear: External ear normal.                 Left Ear: External ear normal.  Eyes: . Pupils are equal, round, and reactive to light. Conjunctivae and EOM are normal               Nose: without d/c or deformity               Neck: Neck supple. Gross normal ROM               Cardiovascular: Normal rate and regular rhythm.                 Pulmonary/Chest: Effort normal and breath sounds without rales or wheezing.                              Neurological: Pt is alert. At baseline orientation, motor grossly intact               Skin: Skin is warm. No rashes, no other new lesions, LE edema - none               Psychiatric: Pt behavior is normal without agitation   Micro: none  Cardiac tracings I have personally interpreted today:  none  Pertinent Radiological findings (summarize): none   Lab Results  Component Value Date   WBC 8.0 11/29/2023   HGB 11.9 (L) 11/29/2023   HCT 35.9 (L) 11/29/2023   PLT 219.0 11/29/2023   GLUCOSE 124 (H) 11/29/2023   CHOL 271 (H) 05/15/2023   TRIG 124.0 05/15/2023   HDL 55.10 05/15/2023   LDLDIRECT 152.0 05/10/2022   LDLCALC 191 (H) 05/15/2023   ALT 208 (H) 11/29/2023   AST 212 (H) 11/29/2023   NA 137 11/29/2023   K 3.9 11/29/2023   CL 103 11/29/2023    CREATININE 1.32 (H) 11/29/2023   BUN 17 11/29/2023   CO2 21 11/29/2023   TSH 1.23 05/15/2023   INR 1.0 07/27/2021   HGBA1C 6.5 11/07/2023   Assessment/Plan:  Cassandra [name] is a 64 y.o. 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  Rash C/w dermatitis vs hives - for depomedrol 80 mg IM, medrol  pack, and triam cr prn most itchy areas, and consider allergy referral if recurs.    Followup: Return if symptoms worsen or fail to improve.  Lynwood Rush, MD 06/05/2024 9:33 AM [facility]     [1]  Allergies Allergen Reactions   Erythromycin Diarrhea    REACTION: Nausea/vomiting   Ezetimibe Other (See Comments)    Muscle Cramps Nausea Diarrhea   Other Nausea Only and Other (See Comments)    Knocks her out, or makes her sleepy and does not take them. Muscle Cramps Nausea Diarrhea Uncoded Allergy. Allergen: vioxx   Rofecoxib Other (See Comments)    Pt not sure but knows she can not take it. REACTION: Nausea/vomiting   Statins Other (See Comments)    myalgias   Zonisamide Other (See Comments)    Fogginess Fogginess    Carvacrol     Other reaction(s): Muscle Pain   Doxycycline  Other (See Comments)    LE cramping  Elavil  [Amitriptyline ] Other (See Comments)    groggy   Metoprolol      Other reaction(s): Muscle Pain   Mycophenolate  Mofetil Other (See Comments)   Nsaids     REACTION: GI  Cramping/nausea  [2]  Current Outpatient Medications on File Prior to Visit  Medication Sig Dispense Refill   albuterol  (VENTOLIN  HFA) 108 (90 Base) MCG/ACT inhaler Inhale 2 puffs into the lungs every 6 (six) hours as needed for wheezing or shortness of breath. (Patient not taking: Reported on 05/12/2024) 8 g 0   azelastine  (ASTELIN ) 0.1 % nasal spray Place 1 spray into both nostrils 2 (two) times daily as needed for rhinitis. 30 mL 2   cefdinir  (OMNICEF ) 300 MG capsule Take 1 capsule (300 mg total) by mouth 2 (two) times daily. (Patient not taking: Reported on 05/12/2024) 14 capsule 0   diclofenac  Sodium (VOLTAREN ) 1 % GEL Apply 4 g topically 4 (four) times daily as needed. 350 g 3   fluticasone  (FLONASE ) 50 MCG/ACT nasal spray SHAKE LIQUID AND USE 2 SPRAYS IN EACH NOSTRIL DAILY (Patient not taking: Reported on 05/12/2024) 16 g 6   levocetirizine (XYZAL ) 5 MG tablet Take 1 tablet (5 mg total) by mouth every evening. (Patient not taking: Reported on 05/12/2024) 90 tablet 0   magnesium  oxide (MAG-OX) 400 MG tablet Take 400 mg by mouth every evening.     olopatadine  (PATANOL) 0.1 % ophthalmic solution Place 1 drop into both eyes 2 (two) times daily. 5 mL 3   Omega-3 Fatty Acids (FISH OIL ) 1000 MG CAPS Take 1 capsule (1,000 mg total) by mouth daily. 90 capsule 3   predniSONE  (DELTASONE ) 5 MG tablet Take 5 mg by mouth daily with breakfast.     tacrolimus  (PROGRAF ) 1 MG capsule Take 2 capsules (2 mg total) by mouth 2 (two) times daily.     No current facility-administered medications on file prior to visit.

## 2024-06-05 NOTE — Telephone Encounter (Signed)
 Paper has been placed in provider's box

## 2024-06-08 DIAGNOSIS — Z0279 Encounter for issue of other medical certificate: Secondary | ICD-10-CM

## 2024-06-12 ENCOUNTER — Ambulatory Visit: Admitting: Internal Medicine

## 2024-06-12 ENCOUNTER — Encounter: Payer: Self-pay | Admitting: Internal Medicine

## 2024-06-12 VITALS — BP 116/84 | HR 80 | Temp 97.9°F | Ht 65.0 in | Wt 173.0 lb

## 2024-06-12 DIAGNOSIS — R17 Unspecified jaundice: Secondary | ICD-10-CM

## 2024-06-12 LAB — COMPREHENSIVE METABOLIC PANEL WITH GFR
ALT: 344 U/L — ABNORMAL HIGH (ref 3–35)
AST: 220 U/L — ABNORMAL HIGH (ref 5–37)
Albumin: 3.6 g/dL (ref 3.5–5.2)
Alkaline Phosphatase: 688 U/L — ABNORMAL HIGH (ref 39–117)
BUN: 28 mg/dL — ABNORMAL HIGH (ref 6–23)
CO2: 24 meq/L (ref 19–32)
Calcium: 9.3 mg/dL (ref 8.4–10.5)
Chloride: 104 meq/L (ref 96–112)
Creatinine, Ser: 1.28 mg/dL — ABNORMAL HIGH (ref 0.40–1.20)
GFR: 44.41 mL/min — ABNORMAL LOW
Glucose, Bld: 267 mg/dL — ABNORMAL HIGH (ref 70–99)
Potassium: 3.7 meq/L (ref 3.5–5.1)
Sodium: 135 meq/L (ref 135–145)
Total Bilirubin: 6 mg/dL — ABNORMAL HIGH (ref 0.2–1.2)
Total Protein: 7 g/dL (ref 6.0–8.3)

## 2024-06-12 MED ORDER — KETOCONAZOLE 2 % EX CREA: 1.0000 | TOPICAL_CREAM | Freq: Every day | CUTANEOUS | 0 refills | Status: AC | PRN

## 2024-06-12 NOTE — Patient Instructions (Addendum)
" ° ° ° ° °  Blood work was ordered.   Have this done downstairs.       Medications changes include :   ketoconazole  cream daily     "

## 2024-06-12 NOTE — Telephone Encounter (Signed)
 Printed forms and called Patient to inform ready for pick up

## 2024-06-12 NOTE — Progress Notes (Signed)
 "   Subjective:    Patient ID: Cassandra [name], female    DOB: December 16, 1960, 64 y.o.   MRN: [id number]      HPI Cassandra [name] is here for  Chief Complaint  Patient presents with   Rash    Both arms, feet, and face   Jaundice    Patient reports yellow tint to her eyes   He was seen here 1/2 for a rash/itching-diagnosed with dermatitis versus hives.  There was no obvious cause for this.  She received a Depo-Medrol  80 mg IM x 1 and was prescribed Medrol  Dosepak, triamcinolone  cream for most itchy areas.  Allergy referral was discussed if no improvement or recurrence.      Discussed the use of AI scribe software for clinical note transcription with the patient, who gave verbal consent to proceed.  History of Present Illness Cassandra [name] is a 64 year old female with a history of liver transplant who presents with a rash and itching after bathing.  She experiences a rash and itching in various areas of her body, particularly after bathing. The rash is persistent and worsens with water exposure. It is more intense than previous episodes and is located on her nose and legs. She has used an old cream that provided some relief, clearing up parts of the rash on her legs.  She has a history of liver transplant 34 years ago and has been experiencing elevated liver function tests. Her bilirubin was noted to be 5.2 in her last blood work over a month ago. She has not noticed any symptoms of jaundice herself, but her daughter observed a yellowish tint in her eyes when she looks up.  She has been using prednisone , which can cause heart palpitations if used excessively. She mentions a previous increase in her medication that made her sick, leading to hospitalization and subsequent medical bills. She has switched doctors for a second opinion regarding her treatment.  She reports having eczema and white spots appearing on her feet and other areas. She has used a cream prescribed on the second of the  month, which helped clear up some areas, but she is concerned about the availability of the cream she used in the past.  It was called Versiclear lotion.  She has an old bottle and has used a little bit and it has helped slightly.  She was wondering if this was still available.  She does have follow-up liver blood work next week, but is interested in having it rechecked today.  After reviewing her chart it looks like they are concerned about rejection of her liver transplant.  She follows closely with [facility] and liver and transplant.     Medications and allergies reviewed with patient and updated if appropriate.  Medications Ordered Prior to Encounter[1]  Review of Systems  Constitutional:  Negative for fatigue and fever.  Gastrointestinal:  Negative for abdominal pain and nausea.  Skin:  Positive for rash.       Itching       Objective:   Vitals:   06/12/24 1533  BP: 116/84  Pulse: 80  Temp: 97.9 F (36.6 C)  SpO2: 98%   BP Readings from Last 3 Encounters:  06/12/24 116/84  06/05/24 128/80  01/27/24 131/87   Wt Readings from Last 3 Encounters:  06/12/24 173 lb (78.5 kg)  06/05/24 175 lb (79.4 kg)  01/27/24 178 lb 3.2 oz (80.8 kg)   Body mass index is 28.79 kg/m.    Physical  Exam Constitutional:      General: She is not in acute distress.    Appearance: Normal appearance. She is not ill-appearing.  Eyes:     General: Scleral icterus (Bilateral) present.  Skin:    General: Skin is warm and dry.     Comments: Several areas of excoriation, some papules and keratosis.  On arms and legs in different areas.  Appears to be different rashes.  Some of the rash she states is similar to the tinea versicolor that she had in the past.  Neurological:     Mental Status: She is alert.            Assessment & Plan:     Assessment and Plan Assessment & Plan Chronic dermatitis with tinea versicolor, eczema? She feels the dermatitis is exacerbated by  water exposure.  Her worsening liver function is also likely a component to her symptoms.  Will continue current treatment effective for eczema, tinea versicolor. - Prescribed ketoconazole  cream for tinea versicolor or potential fungal infection.   Jaundice with abnormal liver function tests Jaundice with elevated bilirubin and liver function tests.  Following closely with [facility] and liver and transplant.  It appears there are some concerns about liver rejection.  Liver tests and bilirubin have been increasing over the months.  Appears jaundiced here today. - Ordered complete metabolic panel. - She does have follow-up blood work with her [facility] specialist next week.        [1]  Current Outpatient Medications on File Prior to Visit  Medication Sig Dispense Refill   azelastine  (ASTELIN ) 0.1 % nasal spray Place 1 spray into both nostrils 2 (two) times daily as needed for rhinitis. 30 mL 2   diclofenac  Sodium (VOLTAREN ) 1 % GEL Apply 4 g topically 4 (four) times daily as needed. 350 g 3   levocetirizine (XYZAL ) 5 MG tablet Take 1 tablet (5 mg total) by mouth every evening. (Patient not taking: Reported on 05/12/2024) 90 tablet 0   magnesium  oxide (MAG-OX) 400 MG tablet Take 400 mg by mouth every evening.     methylPREDNISolone  (MEDROL  DOSEPAK) 4 MG TBPK tablet 4 tab by mouth once daily for 3 days, then 2 tabs x 3 days, then 1 tab x 3 days 21 tablet 0   olopatadine  (PATANOL) 0.1 % ophthalmic solution Place 1 drop into both eyes 2 (two) times daily. 5 mL 3   Omega-3 Fatty Acids (FISH OIL ) 1000 MG CAPS Take 1 capsule (1,000 mg total) by mouth daily. 90 capsule 3   predniSONE  (DELTASONE ) 5 MG tablet Take 5 mg by mouth daily with breakfast.     tacrolimus  (PROGRAF ) 1 MG capsule Take 2 capsules (2 mg total) by mouth 2 (two) times daily.     triamcinolone  cream (KENALOG ) 0.1 % Apply 1 Application topically 2 (two) times daily. 30 g 1   No current facility-administered medications on  file prior to visit.   "

## 2024-06-13 ENCOUNTER — Ambulatory Visit: Payer: Self-pay | Admitting: Internal Medicine

## 2024-06-16 ENCOUNTER — Telehealth: Payer: Self-pay

## 2024-06-16 ENCOUNTER — Other Ambulatory Visit: Payer: Self-pay

## 2024-06-16 DIAGNOSIS — L309 Dermatitis, unspecified: Secondary | ICD-10-CM

## 2024-06-16 NOTE — Telephone Encounter (Signed)
 Copied from CRM #[id number]. Topic: Referral - Question >> Jun 16, 2024  9:56 AM Terri MATSU wrote: Reason for CRM: Katie from [facility] is needing a referral for patient visit for today but will have to be rescheduled. Referral can be faxed to [phone]  [facility] to place referral to derm for her eczema   Message sent to pcp to address

## 2024-06-16 NOTE — Telephone Encounter (Signed)
 Referral has been placed.

## 2024-06-18 ENCOUNTER — Ambulatory Visit: Payer: Self-pay

## 2024-06-18 NOTE — Telephone Encounter (Signed)
 FYI Only or Action Required?: Action required by provider: referral request and update on patient condition.  Patient was last seen in primary care on 06/12/2024 by Geofm Glade PARAS, MD.  Called Nurse Triage reporting Rash.  Symptoms began several weeks ago.  Interventions attempted: OTC medications: ketonazole.  Symptoms are: gradually worsening.  Triage Disposition: See Physician Within 24 Hours  Patient/caregiver understands and will follow disposition?: Yes   Copied from CRM #[id number]. Topic: Clinical - Red Word Triage >> Jun 18, 2024 10:27 AM Drema MATSU wrote: Red Word that prompted transfer to Nurse Triage: Patient stated that she has a rash in the inner part of arms, back, face, and one bump down below, and on her leg. She states that the rash has worsened. Reason for Disposition  SEVERE itching (i.e., interferes with sleep, normal activities or school)  Answer Assessment - Initial Assessment Questions Referral for dermatology pending, patient has not been able to see dermatology, please resolve  1. APPEARANCE of RASH: What does the rash look like? (e.g., blisters, dry flaky skin, red spots, redness, sores)     Raised, redness 2. SIZE: How big are the spots? (e.g., tip of pen, eraser, coin; inches, centimeters)     Small, and some with white spots 3. LOCATION: Where is the rash located?     Worsening, now breaking out on head and nose, and shoulder blade, and chest 4. COLOR: What color is the rash? (Note: It is difficult to assess rash color in people with darker-colored skin. When this situation occurs, simply ask the caller to describe what they see.)     redness 5. ONSET: When did the rash begin?     Two weeks ago 6. FEVER: Do you have a fever? If Yes, ask: What is your temperature, how was it measured, and when did it start?     denies 7. ITCHING: Does the rash itch? If Yes, ask: How bad is the itch? (Scale 1-10; or mild, moderate, severe)     Severe at  times 8. CAUSE: What do you think is causing the rash?     Previously diagnosed with dermatitis or eczema  9. MEDICINE FACTORS: Have you started any new medicines within the last 2 weeks? (e.g., antibiotics)      ketonazole 10. OTHER SYMPTOMS: Do you have any other symptoms? (e.g., dizziness, headache, sore throat, joint pain)       denies  Protocols used: Rash or Redness - [facility]

## 2024-06-19 ENCOUNTER — Encounter: Payer: Self-pay | Admitting: Internal Medicine

## 2024-06-19 ENCOUNTER — Ambulatory Visit (INDEPENDENT_AMBULATORY_CARE_PROVIDER_SITE_OTHER): Admitting: Internal Medicine

## 2024-06-19 VITALS — BP 110/72 | HR 64 | Temp 98.0°F | Ht 65.0 in | Wt 171.6 lb

## 2024-06-19 DIAGNOSIS — K831 Obstruction of bile duct: Secondary | ICD-10-CM

## 2024-06-19 DIAGNOSIS — Z944 Liver transplant status: Secondary | ICD-10-CM

## 2024-06-19 DIAGNOSIS — L2981 Cholestatic pruritus: Secondary | ICD-10-CM

## 2024-06-19 NOTE — Patient Instructions (Addendum)
 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 The combination of markedly elevated alkaline phosphatase (~700 units/L) and bilirubin (~5 mg/dL) persisting for 6 months, particularly following mycophenolate  dose escalation, suggests either drug-induced cholestasis, immune-mediated cholangiopathy, or biliary complications.[1][2]  Initial laboratory evaluation should include fractionation of bilirubin to confirm conjugated hyperbilirubinemia, GGT to verify hepatic origin of alkaline phosphatase, and autoimmune markers (antimitochondrial antibody, antinuclear antibody, smooth muscle antibody) to assess for recurrent primary biliary cholangitis or autoimmune cholangiopathy.[3] Additional testing should exclude viral hepatitis, hemochromatosis, and other metabolic causes.[3][4] Serial monitoring of liver biochemistries and clinical scoring systems (Child-Pugh, MELD) can track disease progression.[5]  Medication-induced cholestasis warrants careful consideration. Mycophenolate  can cause hepatotoxicity that reduces tacrolimus  metabolism, potentially leading to elevated tacrolimus  levels and compounding liver injury.[6] Although mycophenolate  was discontinued, tacrolimus  itself can contribute to cholestasis in transplant recipients.[7][8] Therapeutic drug monitoring of tacrolimus  levels is essential, with dose adjustment if levels are elevated.[8] Consider switching to alternative immunosuppression (such as mTOR inhibitors) if drug-induced injury is suspected.[6]  Liver biopsy may be necessary for definitive diagnosis when non-invasive testing is unrevealing, particularly to differentiate between rejection (acute, chronic, or antibody-mediated), recurrent autoimmune disease, and biliary strictures.[1][2][9][3] Post-transplant  cholestasis often shows overlapping histological features of rejection and biliary injury, making biopsy interpretation challenging but crucial.[2] Donor-specific antibodies should be tested if antibody-mediated rejection is suspected.[2]  Empiric therapy with ursodeoxycholic acid (13-23 mg/kg/day) may be considered if autoimmune cholangiopathy or recurrent primary sclerosing cholangitis is suspected based on clinical context and autoimmune markers, particularly if alkaline phosphatase remains persistently elevated after 6 months of observation.[10] Treatment should be continued only if meaningful reduction in alkaline phosphatase or symptom improvement occurs within 12 months.[10]  Close collaboration with the transplant hepatology team is essential, as post-transplant intrahepatic cholestasis carries significant risk of graft loss and may require retransplantation if unresolved.[1][7] Regular clinical follow-up with serial liver biochemistries will help guide management decisions and timing of potential liver biopsy.   Would you like me to review the evidence and protocols for performing a liver biopsy in post-transplant patients with persistent cholestasis, including its diagnostic yield and impact on management decisions? References Post-Liver Transplant Intrahepatic Cholestasis: Etiology, Clinical Presentation, Therapy. Ponziani FR, Bhoori S, Pompili M, et al. European Review for Medical and [employer]. 2017;21(1 Suppl):23-36. 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 Angelico R, Gerlach UA, Gunson BK, et al. Transplantation. 2018;102(8):1307-1315. doi:10.1097/TP.0000000000002139. ACG Clinical Guideline: Evaluation of Abnormal Liver Chemistries. Kwo PY, Cohen SM, Lim JK. The American Journal of Gastroenterology. 2017;112(1):18-35. doi:10.1038/ajg.2016.517. American Gastroenterological  Association Medical Position Statement: Evaluation of Liver Chemistry Tests. Gastroenterology. 2002;123(4):1364-6. doi:10.1053/gast.2002.36060. Noninvasive Imaging of Hepatic Dysfunction: A State-of-the-Art Review. [place] ONEIDA Cena SHELLY Ethlyn [employer], Rhenda B. World Journal of Gastroenterology. 2022;28(16):1625-1640. doi:10.3748/wjg.386-315-4457. 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 Chen CC, Chang [place], [facility] [place]. Transplantation Proceedings. 2022;54(10):2739-2743. doi:10.1016/j.transproceed.2022.10.036. Posttransplant Immune-Mediated Cholangiopathies. Dumortier J, Conti F, Scoazec JY. Current Opinion in Gastroenterology. 2022;38(2):98-103. doi:10.1097/MOG.0000000000000815. Tacrolimus . Food and Drug Administration. Updated date: 2024-05-05. Evaluation of Abnormal Liver-Enzyme Results in Asymptomatic Patients. Fredirick SHARA Aho MM. The New England Journal of Medicine. 2000;342(17):1266-71. doi:10.1056/[id number]. AASLD Practice Guidance on Primary Sclerosing Cholangitis and Cholangiocarcinoma. Bowlus CL, Irena CROME, Bergquist A, et al. Hepatology [place], [place] 2023;77(2):659-702. doi:10.1002/hep.32771.    Cholestyramine  (4-16 g/day) is the recommended first-line treatment for  cholestatic pruritus in this liver transplant patient, taken approximately 20 minutes before meals and spaced from other medications due to drug interactions.[1][2][3] Non-pharmacologic measures including moisturizing creams, avoidance of hot baths and harsh soaps, loose-fitting clothing, and cool humidified air should be implemented concurrently.[1][2]  For refractory pruritus, second-line options include rifampin, naltrexone, and sertraline, though each has important limitations in this patient. Rifampin (150-300 mg twice daily) is contraindicated when bilirubin exceeds 2.5 mg/dL due to hepatotoxicity risk, making it inappropriate given her bilirubin  of ~5 mg/dL.[1] Naltrexone can be initiated at 12.5 mg daily and titrated to 50 mg/day, but requires monitoring for hepatotoxicity and should be avoided if the patient uses opioids for pain.[1][3] Sertraline (starting at 25 mg, titrating to 75-100 mg/day) has shown moderate antipruritic effects independent of depression improvement, though careful dose titration is needed given hepatic metabolism.[1][3]  Phenobarbital (60-100 mg at bedtime) may address both pruritus and sleep disturbance in refractory cases.[4][2] While antihistamines like hydroxyzine  (25 mg nightly) are generally ineffective for cholestatic pruritus, their sedating properties may help with pruritus-mediated sleep disturbances, though they carry risk of exacerbating encephalopathy.[1][5]  For severe refractory pruritus, invasive options include plasmapheresis, albumin dialysis, or molecular adsorbent recirculating system, which can provide relief ranging from weeks to years.[6][7][8] Nemolizumab, recently FDA-approved for prurigo nodularis and atopic dermatitis, shows promise based on a 2025 case report of complete pruritus resolution in a post-transplant [facility] patient with cholestatic pruritus who failed conventional therapies including naltrexone and dupilumab.[9][10] The patient experienced sustained relief with standard dosing (60 mg loading, then 30 mg every 4 weeks), though this represents off-label use requiring further study.[10]  Close collaboration with transplant hepatology remains essential, as intractable pruritus can rarely serve as an indication for retransplantation.[4][2]   Would you like me to review the most recent literature on the safety and efficacy of nemolizumab for cholestatic pruritus in post-liver transplant patients, including any reported adverse effects or long-term outcomes? References AASLD Practice Guidance: Palliative Care and Symptom-Based Management in Decompensated Cirrhosis. Rogal SS, Conny CROME, Patel A,  et al. Hepatology [place] 2022;76(3):819-853. doi:10.1002/hep.67621. AASLD Practice Guidance on Primary Sclerosing Cholangitis and Cholangiocarcinoma. Bowlus CL, Irena CROME, Bergquist A, et al. Hepatology [place] 2023;77(2):659-702. doi:10.1002/hep.32771. Primary Biliary Cholangitis: 2018 Practice Guidance From the American Association for the Study of Liver Diseases. Lindor KD, Bowlus CL, Felton JINNY Beryl JAYSON, Mayo M. Hepatology [place] 2019;69(1):394-419. doi:10.1002/hep.69854. ACG Clinical Guideline: Primary Sclerosing Cholangitis. Lindor KD, Ledell BOTH, [place]. The American Journal of Gastroenterology. 2015;110(5):646-59; quiz 660. doi:10.1038/ajg.2015.112. Diagnosis and Management of Cirrhosis and Its Complications: A Review. Louis IVER Molt ND. JAMA. 2023;329(18):1589-1602. doi:10.1001/jama.7976.4002. Primary Biliary Cirrhosis. Lorene LEDELL Lash AH, Lindor KD. Lancet [place], [place]). 2015;386(10003):1565-75. doi:10.1016/S0140-6736(15)00154-3. Pruritus in Chronic Liver Disease. Selim R, Venancio ALF Clinics in Liver Disease. 2023;27(1):47-55. doi:10.1016/j.cld.2022.08.011. Management of Chronic Hepatic Itch. Dll MM, Berneta AE. Dermatologic Clinics. 2018;36(3):293-300. doi:10.1016/j.det.2018.02.008. FDA Orange Book. FDA Orange Book. Nemolizumab for Cholestatic Pruritus in a Patient With Liver Transplant and Primary Sclerosing Cholangitis. Soto-Canetti G, Talia J. JAMA Dermatology. 7974;:[id number]. doi:10.1001/jamadermatol.2025.1471.

## 2024-06-19 NOTE — Progress Notes (Signed)
 ==============================  [facility]: [phone]   -- Medical Office Visit --  Patient: Cassandra [name]      Age: 64 y.o.       Sex:  female  Date:   06/19/2024 Today's Healthcare Provider: Bernardino KANDICE Cone, MD  ==============================   Chief Complaint: Rash (Eczema on neck back and now on face forehead itchy as well states skin dr referral in will get team to re send referral as well.)  Discussed the use of AI scribe software for clinical note transcription with the patient, who gave verbal consent to proceed.  History of Present Illness 63 year old female with a history of liver transplant who presents with jaundice and pruritus.  She noticed yellowing of the eyes approximately six to seven days ago, first observed by her daughters. She experiences generalized pruritus, which is more severe than her usual eczema flare-ups that occur around November and December. The pruritus affects her face, shoulders, and back, and she notes that her skin is dry, possibly due to water issues at home.  She has a history of liver transplant and mentions elevated bilirubin levels, with the most recent level being 5.0. Blood work was done yesterday, and results are pending. She has experienced fluctuating bilirubin levels since December.  She has a history of kidney issues, with past kidney shutdown, but her creatinine levels have remained stable at 1.3 for the past ten years. She mentions a past increase in mycophenolate  dosage, which she believes may have contributed to her current issues. She is no longer taking mycophenolate  but continues on Prograf . She has not been to [facility] for a while due to dissatisfaction with her care there.  Her current medications include Prograf , and she has stopped taking mycophenolate . She is sensitive to many medications and cannot take Benadryl. She is seeking relief for her pruritus and is interested in non-drug  treatments, such as creams or salves.  She mentions a past experience with a liver biopsy that showed no chronic rejection and was told she had a bowel obstruction. She has been advised to undergo an ERCP and another liver biopsy, but she is hesitant about these procedures.  No recent flu or flu shot due to past adverse reaction. Reports stable bowel movements.  Wt Readings from Last 50 Encounters:  06/19/24 171 lb 9.6 oz (77.8 kg)  06/12/24 173 lb (78.5 kg)  06/05/24 175 lb (79.4 kg)  01/27/24 178 lb 3.2 oz (80.8 kg)  12/16/23 173 lb 12.8 oz (78.8 kg)  11/28/23 176 lb 12.8 oz (80.2 kg)  11/19/23 178 lb (80.7 kg)  11/07/23 178 lb 12.8 oz (81.1 kg)  10/23/23 180 lb 12.4 oz (82 kg)  10/23/23 182 lb 6.4 oz (82.7 kg)  10/17/23 188 lb (85.3 kg)  09/09/23 187 lb (84.8 kg)  08/06/23 188 lb 12.8 oz (85.6 kg)  07/17/23 191 lb (86.6 kg)  05/15/23 191 lb 9.6 oz (86.9 kg)  04/30/23 192 lb 6 oz (87.3 kg)  04/01/23 195 lb 3.2 oz (88.5 kg)  01/30/23 193 lb (87.5 kg)  10/16/22 191 lb 3.2 oz (86.7 kg)  10/16/22 187 lb 12.8 oz (85.2 kg)  10/12/22 187 lb 9.6 oz (85.1 kg)  09/28/22 193 lb (87.5 kg)  09/20/22 191 lb 6.4 oz (86.8 kg)  09/17/22 191 lb (86.6 kg)  07/11/22 190 lb 3.2 oz (86.3 kg)  05/31/22 187 lb (84.8 kg)  05/10/22 188 lb 6.4 oz (85.5 kg)  04/30/22 187 lb (  84.8 kg)  04/24/22 186 lb (84.4 kg)  04/12/22 187 lb (84.8 kg)  04/10/22 184 lb 4 oz (83.6 kg)  02/23/22 184 lb (83.5 kg)  02/14/22 183 lb (83 kg)  01/25/22 186 lb (84.4 kg)  01/22/22 178 lb (80.7 kg)  12/13/21 187 lb 9.6 oz (85.1 kg)  11/28/21 186 lb 3.2 oz (84.5 kg)  10/05/21 191 lb (86.6 kg)  09/25/21 189 lb 9.6 oz (86 kg)  08/28/21 190 lb 12.8 oz (86.5 kg)  08/07/21 192 lb 8 oz (87.3 kg)  07/28/21 191 lb 2 oz (86.7 kg)  06/26/21 202 lb 1.6 oz (91.7 kg)  05/08/21 201 lb 9.6 oz (91.4 kg)  02/28/21 204 lb 12.8 oz (92.9 kg)  08/24/20 202 lb 6.4 oz (91.8 kg)  08/02/20 213 lb (96.6 kg)  07/13/20 204 lb 9.6 oz (92.8 kg)   06/06/20 205 lb (93 kg)  05/18/20 202 lb 6.4 oz (91.8 kg)   Lab Results  Component Value Date/Time   ALT 344 (H) 06/12/2024 04:13 PM   ALT 208 (H) 11/29/2023 08:03 AM   ALT 35 11/07/2023 09:23 AM   ALT 119 (H) 10/26/2023 04:42 AM   ALT 127 (H) 10/24/2023 08:02 AM   Lab Results  Component Value Date/Time   AST 220 (H) 06/12/2024 04:13 PM   AST 212 (H) 11/29/2023 08:03 AM   AST 33 11/07/2023 09:23 AM   AST 75 (H) 10/26/2023 04:42 AM   AST 75 (H) 10/24/2023 08:02 AM   Lab Results  Component Value Date/Time   ALKPHOS 688 (H) 06/12/2024 04:13 PM   ALKPHOS 659 (H) 11/29/2023 08:03 AM   ALKPHOS 169 (H) 11/07/2023 09:23 AM   ALKPHOS 269 (H) 10/26/2023 04:42 AM   ALKPHOS 171 (H) 10/24/2023 08:02 AM    Lab Results  Component Value Date   BILITOT 6.0 (H) 06/12/2024   BILITOT 0.9 11/29/2023   BILITOT 0.6 11/07/2023   BILITOT 0.8 10/26/2023   BILITOT 0.9 10/24/2023   BILITOT 1.2 10/23/2023   BILITOT 0.9 10/23/2023   BILITOT 0.4 10/17/2023   BILITOT 0.7 05/15/2023   BILITOT 0.6 05/10/2022   BILITOT 0.6 01/22/2022   BILITOT 0.6 07/27/2021   BILITOT 0.3 02/28/2021   BILITOT 0.4 02/22/2020   BILITOT 0.8 11/20/2019   BILITOT 0.5 09/03/2019   BILITOT 0.3 08/10/2019   BILITOT 0.5 06/22/2019   BILITOT 0.5 05/19/2019   BILITOT 0.4 04/22/2019   BILITOT 0.3 01/21/2019   BILITOT 0.3 12/10/2018   BILITOT 0.5 10/24/2018   BILITOT 0.5 06/19/2018   BILITOT 0.5 05/07/2018   BILITOT 0.5 03/06/2018   BILITOT 0.4 11/21/2017   BILITOT 0.3 10/17/2017   BILITOT 0.3 07/17/2017   BILITOT 0.4 04/01/2017   BILITOT 0.4 03/15/2017   BILITOT 0.5 12/26/2016   BILITOT 0.5 10/09/2016   BILITOT 0.3 09/04/2016   BILITOT 0.4 05/25/2016   BILITOT 0.5 04/19/2016   BILITOT 0.6 03/02/2016   BILITOT 0.4 02/03/2016   BILITOT 0.3 01/18/2016   BILITOT 0.4 10/25/2015   BILITOT 0.3 08/01/2015   BILITOT 0.4 07/29/2015   BILITOT 0.4 05/11/2015   BILITOT 0.5 04/11/2015   BILITOT 0.5 04/06/2015    BILITOT 0.6 02/17/2015   BILITOT 0.4 12/14/2014   BILITOT 0.4 11/23/2014   BILITOT 0.5 06/23/2014   BILITOT 0.5 06/23/2014   Lab Results  Component Value Date   CREATININE 1.28 (H) 06/12/2024   CREATININE 1.32 (H) 11/29/2023   CREATININE 1.18 11/07/2023   CREATININE 1.18 (H) 10/26/2023   CREATININE 1.11 (H)  10/25/2023   CREATININE 1.30 (H) 10/24/2023   CREATININE 1.02 (H) 10/24/2023   CREATININE 2.12 (H) 10/23/2023   CREATININE 1.66 (H) 10/23/2023   CREATININE 1.22 (H) 10/17/2023   CREATININE 1.25 (H) 05/15/2023   CREATININE 1.16 05/10/2022   CREATININE 1.12 (H) 01/22/2022   CREATININE 1.29 (H) 07/27/2021   CREATININE 1.23 (H) 02/28/2021   CREATININE 1.24 (H) 02/22/2020   CREATININE 1.17 (H) 11/20/2019   CREATININE 1.22 (H) 09/03/2019   CREATININE 1.29 (H) 08/10/2019   CREATININE 1.48 (H) 06/22/2019   CREATININE 1.30 (H) 05/19/2019   CREATININE 1.22 (H) 04/22/2019   CREATININE 1.24 (H) 01/21/2019   CREATININE 1.24 (H) 12/10/2018   CREATININE 1.31 (H) 10/24/2018   CREATININE 1.27 (H) 06/19/2018   CREATININE 1.39 (H) 05/07/2018   CREATININE 1.20 03/06/2018   CREATININE 1.24 (H) 11/21/2017   CREATININE 1.21 (H) 10/17/2017   CREATININE 1.32 (H) 07/17/2017   CREATININE 1.25 (H) 04/01/2017   CREATININE 1.32 (H) 03/15/2017   CREATININE 1.30 (H) 12/26/2016   CREATININE 1.30 (H) 10/09/2016   CREATININE 1.27 (H) 10/09/2016   CREATININE 1.31 (H) 09/04/2016   CREATININE 1.31 (H) 05/25/2016   CREATININE 1.27 (H) 04/19/2016   CREATININE 1.24 (H) 03/02/2016   CREATININE 1.20 02/03/2016   CREATININE 1.29 (H) 01/18/2016   CREATININE 1.28 (H) 10/25/2015   CREATININE 1.34 (H) 08/01/2015   CREATININE 1.47 (H) 07/29/2015   CREATININE 1.34 (H) 05/11/2015   CREATININE 1.31 (H) 04/11/2015   CREATININE 1.13 04/06/2015   CREATININE 1.33 (H) 03/25/2015   CREATININE 1.29 (H) 02/17/2015    Background Reviewed: Problem List: has Anxiety disorder; History of liver transplant (HCC);  Fibromyalgia; Primary osteoarthritis of knees, bilateral; Cervical stenosis of spine; Chronic back pain; Hx of tuberculosis; Combined hyperlipidemia associated with type 2 diabetes mellitus (HCC); Immunosuppressed status; Primary insomnia; Lumbar facet arthropathy; Cervical facet joint syndrome; Spondylosis of cervical region without myelopathy or radiculopathy; Statin myopathy; Migraine with aura and without status migrainosus, not intractable; AS (sickle cell trait); Chronic kidney disease (CKD) stage G3a/A1, moderately decreased glomerular filtration rate (GFR) between 45-59 mL/min/1.73 square meter and albuminuria creatinine ratio less than 30 mg/g (HCC); Diet-controlled type 2 diabetes mellitus (HCC); Lumbar spinal stenosis; Screening for osteoporosis; and Rash on their problem list. Past Medical History:  has a past medical history of Anxiety, AS (sickle cell trait) (06/29/2019), Autoimmune hepatitis (HCC), Chronic renal insufficiency, Cirrhosis (HCC), COLONIC POLYPS, ADENOMATOUS, HX OF (05/05/2008), Depression, External hemorrhoids, Fibromyalgia, GERD (gastroesophageal reflux disease), Hyperlipidemia, Internal hemorrhoids, Iron deficiency anemia, Liver replaced by transplant (HCC) (02/22/2009), Migraine, 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, Ruptured disc, cervical, Sickle cell anemia (HCC), and Statin intolerance (05/13/2018). Past Surgical History:   has a past surgical history that includes Liver transplant (1992); Knee arthroscopy (Right, 2010); Rotator cuff repair (Left, 2010); and Abdominal hysterectomy (2010). Social History:   reports that she has never smoked. She has never used smokeless tobacco. She reports that she does not drink alcohol and does not use drugs. Family History:  family history includes Alzheimer's disease in her mother; Cancer in her sister; Dementia in her mother; Diabetes in her brother and mother; Gout in her mother; Heart disease in her sister; Hypertension in her mother and  sister; Stroke in her mother. Allergies:  is allergic to erythromycin, ezetimibe, other, rofecoxib, statins, zonisamide, carvacrol, doxycycline , elavil  [amitriptyline ], metoprolol , mycophenolate  mofetil, and nsaids.   Medication Reconciliation: Current Outpatient Medications on File Prior to Visit  Medication Sig   azelastine  (ASTELIN ) 0.1 % nasal spray Place 1  spray into both nostrils 2 (two) times daily as needed for rhinitis.   diclofenac  Sodium (VOLTAREN ) 1 % GEL Apply 4 g topically 4 (four) times daily as needed.   ketoconazole  (NIZORAL ) 2 % cream Apply 1 Application topically daily as needed for irritation.   levocetirizine (XYZAL ) 5 MG tablet Take 1 tablet (5 mg total) by mouth every evening. (Patient not taking: Reported on 05/12/2024)   magnesium  oxide (MAG-OX) 400 MG tablet Take 400 mg by mouth every evening.   methylPREDNISolone  (MEDROL  DOSEPAK) 4 MG TBPK tablet 4 tab by mouth once daily for 3 days, then 2 tabs x 3 days, then 1 tab x 3 days   olopatadine  (PATANOL) 0.1 % ophthalmic solution Place 1 drop into both eyes 2 (two) times daily.   Omega-3 Fatty Acids (FISH OIL ) 1000 MG CAPS Take 1 capsule (1,000 mg total) by mouth daily.   predniSONE  (DELTASONE ) 5 MG tablet Take 5 mg by mouth daily with breakfast.   tacrolimus  (PROGRAF ) 1 MG capsule Take 2 capsules (2 mg total) by mouth 2 (two) times daily.   triamcinolone  cream (KENALOG ) 0.1 % Apply 1 Application topically 2 (two) times daily.   No current facility-administered medications on file prior to visit.  There are no discontinued medications.   Physical Exam:    06/19/2024   11:16 AM 06/12/2024    3:33 PM 06/05/2024    8:43 AM  Vitals with BMI  Height 5' 5 5' 5 5' 5  Weight 171 lbs 10 oz 173 lbs 175 lbs  BMI 28.56 28.79 29.12  Systolic 110 116 871  Diastolic 72 84 80  Pulse 64 80 80  Vital signs reviewed.  Nursing notes reviewed. Weight trend reviewed. Physical Activity: Unknown (06/02/2024)   Exercise Vital Sign     Days of Exercise per Week: Patient declined    Minutes of Exercise per Session: Not on file   General Appearance:  No acute distress appreciable.   Well-groomed, healthy-appearing female.  Well proportioned with no abnormal fat distribution.  Good muscle tone. Pulmonary:  Normal work of breathing at rest, no respiratory distress apparent. SpO2: 98 %  Musculoskeletal: All extremities are intact.  Neurological:  Awake, alert, oriented, and engaged.  No obvious focal neurological deficits or cognitive impairments.  Sensorium seems unclouded.   Speech is clear and coherent with logical content. Psychiatric:  Appropriate mood, pleasant and cooperative demeanor, thoughtful and engaged during the exam   Verbalized to patient: Physical Exam MEASUREMENTS: Weight- 171. HEENT: Scleral icterus present.   Results:   Verbalized to patient: Results Labs Total bilirubin (06/12/2024): 5.0, previously elevated and fluctuating since December 2025 Alk Phos (06/12/2024): 756 Creatinine (06/18/2024): 1.3 BUN (06/18/2024): Slightly elevated Glucose (06/18/2024): Not elevated Chloride (06/18/2024): Elevated Bicarbonate (06/18/2024): Low     06/05/2024    8:44 AM 05/12/2024   10:13 AM 04/03/2024   10:32 AM 01/27/2024    2:53 PM  PHQ 2/9 Scores  PHQ - 2 Score 0 0 0 0    Office Visit on 06/12/2024  Component Date Value Ref Range Status   Sodium 06/12/2024 135  135 - 145 mEq/L Final   Potassium 06/12/2024 3.7  3.5 - 5.1 mEq/L Final   Chloride 06/12/2024 104  96 - 112 mEq/L Final   CO2 06/12/2024 24  19 - 32 mEq/L Final   Glucose, Bld 06/12/2024 267 (H)  70 - 99 mg/dL Final   BUN 98/90/7973 28 (H)  6 - 23 mg/dL Final   Creatinine, Ser 06/12/2024  1.28 (H)  0.40 - 1.20 mg/dL Final   Total Bilirubin 06/12/2024 6.0 (H)  0.2 - 1.2 mg/dL Final   Alkaline Phosphatase 06/12/2024 688 (H)  39 - 117 U/L Final   AST 06/12/2024 220 (H)  5 - 37 U/L Final   ALT 06/12/2024 344 (H)  3 - 35 U/L Final   Total  Protein 06/12/2024 7.0  6.0 - 8.3 g/dL Final   Albumin 98/90/7973 3.6  3.5 - 5.2 g/dL Final   GFR 98/90/7973 44.41 (L)  >60.00 mL/min Final   Calcium  06/12/2024 9.3  8.4 - 10.5 mg/dL Final  Hospital Outpatient Visit on 01/28/2024  Component Date Value Ref Range Status   Area-P 1/2 01/28/2024 3.37  cm2 Final   S' Lateral 01/28/2024 2.10  cm Final   Est EF 01/28/2024 60 - 65%   Final  Office Visit on 12/16/2023  Component Date Value Ref Range Status   Color, UA 12/16/2023 dark yellow   Final   Clarity, UA 12/16/2023 cloudy   Final   Glucose, UA 12/16/2023 Negative  Negative Final   Bilirubin, UA 12/16/2023 negative   Final   Ketones, UA 12/16/2023 negative   Final   Spec Grav, UA 12/16/2023 1.015  1.010 - 1.025 Final   Blood, UA 12/16/2023 2 (A)   Final   pH, UA 12/16/2023 5.0  5.0 - 8.0 Final   Protein, UA 12/16/2023 Negative  Negative Final   Urobilinogen, UA 12/16/2023 1.0  0.2 or 1.0 E.U./dL Final   Nitrite, UA 92/85/7974 negative   Final   Leukocytes, UA 12/16/2023 Large (3+) (A)  Negative Final   MICRO NUMBER: 12/16/2023 [id number]   Final   SPECIMEN QUALITY: 12/16/2023 Adequate   Final   Sample Source 12/16/2023 URINE   Final   STATUS: 12/16/2023 FINAL   Final   Result: 12/16/2023 No Growth   Final   WBC, UA 12/16/2023 11-20/hpf (A)  0-2/hpf Final   RBC / HPF 12/16/2023 0-2/hpf  0-2/hpf Final   Mucus, UA 12/16/2023 Presence of (A)  None Final   Squamous Epithelial / HPF 12/16/2023 Few(5-10/hpf) (A)  Rare(0-4/hpf) Final   Bacteria, UA 12/16/2023 Few(10-50/hpf) (A)  None Final   Hyaline Casts, UA 12/16/2023 Presence of (A)  None Final   Ca Oxalate Crys, UA 12/16/2023 Presence of (A)  None Final  Appointment on 11/29/2023  Component Date Value Ref Range Status   MICRO NUMBER: 11/29/2023 [id number]   Final   SPECIMEN QUALITY: 11/29/2023 Adequate   Final   Source 11/29/2023 RT ACC   Final   STATUS: 11/29/2023 FINAL   Final   Result: 11/29/2023 No growth after 5 days   Final    COMMENT: 11/29/2023 Aerobic and anaerobic bottle received.   Final   Sed Rate 11/29/2023 98 (H)  0 - 30 mm/hr Final   Sodium 11/29/2023 137  135 - 145 mEq/L Final   Potassium 11/29/2023 3.9  3.5 - 5.1 mEq/L Final   Chloride 11/29/2023 103  96 - 112 mEq/L Final   CO2 11/29/2023 21  19 - 32 mEq/L Final   Glucose, Bld 11/29/2023 124 (H)  70 - 99 mg/dL Final   BUN 93/72/7974 17  6 - 23 mg/dL Final   Creatinine, Ser 11/29/2023 1.32 (H)  0.40 - 1.20 mg/dL Final   Total Bilirubin 11/29/2023 0.9  0.2 - 1.2 mg/dL Final   Alkaline Phosphatase 11/29/2023 659 (H)  39 - 117 U/L Final   AST 11/29/2023 212 (H)  0 - 37 U/L  Final   ALT 11/29/2023 208 (H)  0 - 35 U/L Final   Total Protein 11/29/2023 8.2  6.0 - 8.3 g/dL Final   Albumin 93/72/7974 3.6  3.5 - 5.2 g/dL Final   GFR 93/72/7974 42.96 (L)  >60.00 mL/min Final   Calcium  11/29/2023 9.7  8.4 - 10.5 mg/dL Final   WBC 93/72/7974 8.0  4.0 - 10.5 K/uL Final   RBC 11/29/2023 4.26  3.87 - 5.11 Mil/uL Final   Hemoglobin 11/29/2023 11.9 (L)  12.0 - 15.0 g/dL Final   HCT 93/72/7974 35.9 (L)  36.0 - 46.0 % Final   MCV 11/29/2023 84.2  78.0 - 100.0 fl Final   MCHC 11/29/2023 33.3  30.0 - 36.0 g/dL Final   RDW 93/72/7974 14.2  11.5 - 15.5 % Final   Platelets 11/29/2023 219.0  150.0 - 400.0 K/uL Final   Neutrophils Relative % 11/29/2023 55.9  43.0 - 77.0 % Final   Lymphocytes Relative 11/29/2023 30.8  12.0 - 46.0 % Final   Monocytes Relative 11/29/2023 9.2  3.0 - 12.0 % Final   Eosinophils Relative 11/29/2023 3.5  0.0 - 5.0 % Final   Basophils Relative 11/29/2023 0.6  0.0 - 3.0 % Final   Neutro Abs 11/29/2023 4.5  1.4 - 7.7 K/uL Final   Lymphs Abs 11/29/2023 2.5  0.7 - 4.0 K/uL Final   Monocytes Absolute 11/29/2023 0.7  0.1 - 1.0 K/uL Final   Eosinophils Absolute 11/29/2023 0.3  0.0 - 0.7 K/uL Final   Basophils Absolute 11/29/2023 0.0  0.0 - 0.1 K/uL Final  Office Visit on 11/28/2023  Component Date Value Ref Range Status   Color, UA 11/28/2023 brown    Final   Clarity, UA 11/28/2023 cloudy   Final   Glucose, UA 11/28/2023 Negative  Negative Final   Bilirubin, UA 11/28/2023 positive   Final   Ketones, UA 11/28/2023 positive   Final   Spec Grav, UA 11/28/2023 1.020  1.010 - 1.025 Final   Blood, UA 11/28/2023 positive   Final   pH, UA 11/28/2023 5.0  5.0 - 8.0 Final   Protein, UA 11/28/2023 Positive (A)  Negative Final   Urobilinogen, UA 11/28/2023 0.2  0.2 or 1.0 E.U./dL Final   Nitrite, UA 93/73/7974 neg   Final   Leukocytes, UA 11/28/2023 Large (3+) (A)  Negative Final   MICRO NUMBER: 11/28/2023 [id number]   Final   SPECIMEN QUALITY: 11/28/2023 Adequate   Final   Sample Source 11/28/2023 NOT GIVEN   Final   STATUS: 11/28/2023 FINAL   Final   Result: 11/28/2023 Less than 10,000 CFU/mL of single Gram positive organism isolated. No further testing will be performed. If clinically indicated, recollection using a method to minimize contamination, with prompt transfer to Urine Culture Transport Tube, is recommended.   Final  Office Visit on 11/07/2023  Component Date Value Ref Range Status   WBC 11/07/2023 6.7  4.0 - 10.5 K/uL Final   RBC 11/07/2023 4.70  3.87 - 5.11 Mil/uL Final   Hemoglobin 11/07/2023 13.2  12.0 - 15.0 g/dL Final   HCT 93/94/7974 40.2  36.0 - 46.0 % Final   MCV 11/07/2023 85.4  78.0 - 100.0 fl Final   MCHC 11/07/2023 32.8  30.0 - 36.0 g/dL Final   RDW 93/94/7974 14.2  11.5 - 15.5 % Final   Platelets 11/07/2023 252.0  150.0 - 400.0 K/uL Final   Neutrophils Relative % 11/07/2023 45.5  43.0 - 77.0 % Final   Lymphocytes Relative 11/07/2023 39.7  12.0 -  46.0 % Final   Monocytes Relative 11/07/2023 11.9  3.0 - 12.0 % Final   Eosinophils Relative 11/07/2023 2.4  0.0 - 5.0 % Final   Basophils Relative 11/07/2023 0.5  0.0 - 3.0 % Final   Neutro Abs 11/07/2023 3.0  1.4 - 7.7 K/uL Final   Lymphs Abs 11/07/2023 2.6  0.7 - 4.0 K/uL Final   Monocytes Absolute 11/07/2023 0.8  0.1 - 1.0 K/uL Final   Eosinophils Absolute 11/07/2023 0.2   0.0 - 0.7 K/uL Final   Basophils Absolute 11/07/2023 0.0  0.0 - 0.1 K/uL Final   Sodium 11/07/2023 137  135 - 145 mEq/L Final   Potassium 11/07/2023 3.8  3.5 - 5.1 mEq/L Final   Chloride 11/07/2023 103  96 - 112 mEq/L Final   CO2 11/07/2023 21  19 - 32 mEq/L Final   Glucose, Bld 11/07/2023 111 (H)  70 - 99 mg/dL Final   BUN 93/94/7974 21  6 - 23 mg/dL Final   Creatinine, Ser 11/07/2023 1.18  0.40 - 1.20 mg/dL Final   Total Bilirubin 11/07/2023 0.6  0.2 - 1.2 mg/dL Final   Alkaline Phosphatase 11/07/2023 169 (H)  39 - 117 U/L Final   AST 11/07/2023 33  0 - 37 U/L Final   ALT 11/07/2023 35  0 - 35 U/L Final   Total Protein 11/07/2023 7.6  6.0 - 8.3 g/dL Final   Albumin 93/94/7974 3.7  3.5 - 5.2 g/dL Final   GFR 93/94/7974 49.17 (L)  >60.00 mL/min Final   Calcium  11/07/2023 9.4  8.4 - 10.5 mg/dL Final   Hgb J8r MFr Bld 11/07/2023 6.5  4.6 - 6.5 % Final  Admission on 10/23/2023, Discharged on 10/26/2023  Component Date Value Ref Range Status   Sodium 10/23/2023 128 (L)  135 - 145 mmol/L Final   Potassium 10/23/2023 3.1 (L)  3.5 - 5.1 mmol/L Final   Chloride 10/23/2023 95 (L)  98 - 111 mmol/L Final   CO2 10/23/2023 21 (L)  22 - 32 mmol/L Final   Glucose, Bld 10/23/2023 143 (H)  70 - 99 mg/dL Final   BUN 94/78/7974 35 (H)  8 - 23 mg/dL Final   Creatinine, Ser 10/23/2023 2.12 (H)  0.44 - 1.00 mg/dL Final   Calcium  10/23/2023 8.9  8.9 - 10.3 mg/dL Final   Total Protein 94/78/7974 7.8  6.5 - 8.1 g/dL Final   Albumin 94/78/7974 3.2 (L)  3.5 - 5.0 g/dL Final   AST 94/78/7974 87 (H)  15 - 41 U/L Final   ALT 10/23/2023 186 (H)  0 - 44 U/L Final   Alkaline Phosphatase 10/23/2023 205 (H)  38 - 126 U/L Final   Total Bilirubin 10/23/2023 1.2  0.0 - 1.2 mg/dL Final   GFR, Estimated 10/23/2023 26 (L)  >60 mL/min Final   Anion gap 10/23/2023 12  5 - 15 Final   WBC 10/23/2023 22.3 (H)  4.0 - 10.5 K/uL Final   RBC 10/23/2023 4.73  3.87 - 5.11 MIL/uL Final   Hemoglobin 10/23/2023 13.5  12.0 - 15.0  g/dL Final   HCT 94/78/7974 39.8  36.0 - 46.0 % Final   MCV 10/23/2023 84.1  80.0 - 100.0 fL Final   MCH 10/23/2023 28.5  26.0 - 34.0 pg Final   MCHC 10/23/2023 33.9  30.0 - 36.0 g/dL Final   RDW 94/78/7974 13.2  11.5 - 15.5 % Final   Platelets 10/23/2023 259  150 - 400 K/uL Final   nRBC 10/23/2023 0.0  0.0 -  0.2 % Final   Neutrophils Relative % 10/23/2023 74  % Final   Neutro Abs 10/23/2023 16.6 (H)  1.7 - 7.7 K/uL Final   Lymphocytes Relative 10/23/2023 14  % Final   Lymphs Abs 10/23/2023 3.0  0.7 - 4.0 K/uL Final   Monocytes Relative 10/23/2023 10  % Final   Monocytes Absolute 10/23/2023 2.2 (H)  0.1 - 1.0 K/uL Final   Eosinophils Relative 10/23/2023 1  % Final   Eosinophils Absolute 10/23/2023 0.2  0.0 - 0.5 K/uL Final   Basophils Relative 10/23/2023 0  % Final   Basophils Absolute 10/23/2023 0.1  0.0 - 0.1 K/uL Final   Immature Granulocytes 10/23/2023 1  % Final   Abs Immature Granulocytes 10/23/2023 0.25 (H)  0.00 - 0.07 K/uL Final   Specimen Source 10/23/2023 URINE, CLEAN CATCH   Final   Color, Urine 10/23/2023 YELLOW  YELLOW Final   APPearance 10/23/2023 CLOUDY (A)  CLEAR Final   Specific Gravity, Urine 10/23/2023 1.013  1.005 - 1.030 Final   pH 10/23/2023 5.0  5.0 - 8.0 Final   Glucose, UA 10/23/2023 NEGATIVE  NEGATIVE mg/dL Final   Hgb urine dipstick 10/23/2023 SMALL (A)  NEGATIVE Final   Bilirubin Urine 10/23/2023 NEGATIVE  NEGATIVE Final   Ketones, ur 10/23/2023 NEGATIVE  NEGATIVE mg/dL Final   Protein, ur 94/78/7974 NEGATIVE  NEGATIVE mg/dL Final   Nitrite 94/78/7974 NEGATIVE  NEGATIVE Final   Leukocytes,Ua 10/23/2023 LARGE (A)  NEGATIVE Final   RBC / HPF 10/23/2023 6-10  0 - 5 RBC/hpf Final   WBC, UA 10/23/2023 >50  0 - 5 WBC/hpf Final   Bacteria, UA 10/23/2023 RARE (A)  NONE SEEN Final   Squamous Epithelial / HPF 10/23/2023 6-10  0 - 5 /HPF Final   WBC Clumps 10/23/2023 PRESENT   Final   Mucus 10/23/2023 PRESENT   Final   Hyaline Casts, UA 10/23/2023 PRESENT    Final   Lactic Acid, Venous 10/23/2023 2.1 (HH)  0.5 - 1.9 mmol/L Final   Comment 10/23/2023 NOTIFIED PHYSICIAN   Final   Specimen Description 10/23/2023    Final                   Value:BLOOD LEFT FOREARM Performed at [facility], [address], [place], [place] [address]    Special Requests 10/23/2023    Final                   Value:BOTTLES DRAWN AEROBIC AND ANAEROBIC Blood Culture results may not be optimal due to an inadequate volume of blood received in culture bottles Performed at [facility], [address], [place], [place] [address]    Culture 10/23/2023    Final                   Value:NO GROWTH 5 DAYS Performed at [facility], [address] [place], [place] [address]    Report Status 10/23/2023 10/28/2023 FINAL   Final   Specimen Description 10/23/2023    Final                   Value:BLOOD LEFT ANTECUBITAL Performed at [facility], [address] [address], [place], [place] [address]    Special Requests 10/23/2023    Final                   Value:BOTTLES DRAWN AEROBIC AND ANAEROBIC Blood Culture results may not be optimal due to  an inadequate volume of blood received in culture bottles Performed at [facility], [address] [place], [place] [address]    Culture 10/23/2023    Final                   Value:NO GROWTH 5 DAYS Performed at [facility], [address] [place], [place] [address]    Report Status 10/23/2023 10/28/2023 FINAL   Final   Lactic Acid, Venous 10/23/2023 1.3  0.5 - 1.9 mmol/L Final   HIV Screen 4th Generation wRfx 10/24/2023 Non Reactive  Non Reactive Final   WBC 10/24/2023 15.6 (H)  4.0 - 10.5 K/uL Final   RBC 10/24/2023 3.74 (L)  3.87 - 5.11 MIL/uL Final   Hemoglobin 10/24/2023 10.8 (L)  12.0 - 15.0 g/dL Final   HCT 94/77/7974 30.9 (L)  36.0 - 46.0 % Final   MCV 10/24/2023 82.6  80.0 - 100.0 fL Final   MCH 10/24/2023 28.9  26.0 - 34.0 pg Final   MCHC 10/24/2023 35.0   30.0 - 36.0 g/dL Final   RDW 94/77/7974 13.1  11.5 - 15.5 % Final   Platelets 10/24/2023 168  150 - 400 K/uL Final   nRBC 10/24/2023 0.0  0.0 - 0.2 % Final   Creatinine, Ser 10/24/2023 1.02 (H)  0.44 - 1.00 mg/dL Final   GFR, Estimated 10/24/2023 >60  >60 mL/min Final   Tacrolimus  (FK506) - [facility] 10/24/2023 13.4  5.0 - 20.0 ng/mL Final   Lactic Acid, Venous 10/24/2023 1.2  0.5 - 1.9 mmol/L Final   Lactic Acid, Venous 10/24/2023 1.1  0.5 - 1.9 mmol/L Final   Total Protein 10/24/2023 6.2 (L)  6.5 - 8.1 g/dL Final   Albumin 94/77/7974 2.5 (L)  3.5 - 5.0 g/dL Final   AST 94/77/7974 75 (H)  15 - 41 U/L Final   ALT 10/24/2023 127 (H)  0 - 44 U/L Final   Alkaline Phosphatase 10/24/2023 171 (H)  38 - 126 U/L Final   Total Bilirubin 10/24/2023 0.9  0.0 - 1.2 mg/dL Final   Bilirubin, Direct 10/24/2023 0.3 (H)  0.0 - 0.2 mg/dL Final   Indirect Bilirubin 10/24/2023 0.6  0.3 - 0.9 mg/dL Final   Sodium 94/77/7974 133 (L)  135 - 145 mmol/L Final   Potassium 10/24/2023 3.4 (L)  3.5 - 5.1 mmol/L Final   Chloride 10/24/2023 104  98 - 111 mmol/L Final   CO2 10/24/2023 20 (L)  22 - 32 mmol/L Final   Glucose, Bld 10/24/2023 107 (H)  70 - 99 mg/dL Final   BUN 94/77/7974 27 (H)  8 - 23 mg/dL Final   Creatinine, Ser 10/24/2023 1.30 (H)  0.44 - 1.00 mg/dL Final   Calcium  10/24/2023 8.2 (L)  8.9 - 10.3 mg/dL Final   GFR, Estimated 10/24/2023 46 (L)  >60 mL/min Final   Anion gap 10/24/2023 9  5 - 15 Final   Sodium 10/25/2023 137  135 - 145 mmol/L Final   Potassium 10/25/2023 3.3 (L)  3.5 - 5.1 mmol/L Final   Chloride 10/25/2023 110  98 - 111 mmol/L Final   CO2 10/25/2023 20 (L)  22 - 32 mmol/L Final   Glucose, Bld 10/25/2023 105 (H)  70 - 99 mg/dL Final   BUN 94/76/7974 17  8 - 23 mg/dL Final   Creatinine, Ser 10/25/2023 1.11 (H)  0.44 - 1.00 mg/dL Final   Calcium  10/25/2023 8.5 (L)  8.9 - 10.3 mg/dL Final   GFR, Estimated 10/25/2023  56 (L)  >60 mL/min Final   Anion gap 10/25/2023 7  5 - 15 Final   WBC  10/25/2023 11.3 (H)  4.0 - 10.5 K/uL Final   RBC 10/25/2023 3.98  3.87 - 5.11 MIL/uL Final   Hemoglobin 10/25/2023 11.2 (L)  12.0 - 15.0 g/dL Final   HCT 94/76/7974 33.4 (L)  36.0 - 46.0 % Final   MCV 10/25/2023 83.9  80.0 - 100.0 fL Final   MCH 10/25/2023 28.1  26.0 - 34.0 pg Final   MCHC 10/25/2023 33.5  30.0 - 36.0 g/dL Final   RDW 94/76/7974 13.3  11.5 - 15.5 % Final   Platelets 10/25/2023 187  150 - 400 K/uL Final   nRBC 10/25/2023 0.0  0.0 - 0.2 % Final   Sodium 10/26/2023 137  135 - 145 mmol/L Final   Potassium 10/26/2023 3.3 (L)  3.5 - 5.1 mmol/L Final   Chloride 10/26/2023 105  98 - 111 mmol/L Final   CO2 10/26/2023 21 (L)  22 - 32 mmol/L Final   Glucose, Bld 10/26/2023 96  70 - 99 mg/dL Final   BUN 94/75/7974 11  8 - 23 mg/dL Final   Creatinine, Ser 10/26/2023 1.18 (H)  0.44 - 1.00 mg/dL Final   Calcium  10/26/2023 8.7 (L)  8.9 - 10.3 mg/dL Final   Total Protein 94/75/7974 7.0  6.5 - 8.1 g/dL Final   Albumin 94/75/7974 2.7 (L)  3.5 - 5.0 g/dL Final   AST 94/75/7974 75 (H)  15 - 41 U/L Final   ALT 10/26/2023 119 (H)  0 - 44 U/L Final   Alkaline Phosphatase 10/26/2023 269 (H)  38 - 126 U/L Final   Total Bilirubin 10/26/2023 0.8  0.0 - 1.2 mg/dL Final   GFR, Estimated 10/26/2023 52 (L)  >60 mL/min Final   Anion gap 10/26/2023 11  5 - 15 Final   WBC 10/26/2023 10.3  4.0 - 10.5 K/uL Final   RBC 10/26/2023 4.34  3.87 - 5.11 MIL/uL Final   Hemoglobin 10/26/2023 12.4  12.0 - 15.0 g/dL Final   HCT 94/75/7974 35.9 (L)  36.0 - 46.0 % Final   MCV 10/26/2023 82.7  80.0 - 100.0 fL Final   MCH 10/26/2023 28.6  26.0 - 34.0 pg Final   MCHC 10/26/2023 34.5  30.0 - 36.0 g/dL Final   RDW 94/75/7974 13.1  11.5 - 15.5 % Final   Platelets 10/26/2023 227  150 - 400 K/uL Final   nRBC 10/26/2023 0.0  0.0 - 0.2 % Final  Office Visit on 10/23/2023  Component Date Value Ref Range Status   WBC 10/23/2023 20.4 Repeated and verified X2. (HH)  4.0 - 10.5 K/uL Final   RBC 10/23/2023 4.73  3.87 - 5.11  Mil/uL Final   Hemoglobin 10/23/2023 13.3  12.0 - 15.0 g/dL Final   HCT 94/78/7974 39.9  36.0 - 46.0 % Final   MCV 10/23/2023 84.4  78.0 - 100.0 fl Final   MCHC 10/23/2023 33.4  30.0 - 36.0 g/dL Final   RDW 94/78/7974 14.0  11.5 - 15.5 % Final   Platelets 10/23/2023 227.0  150.0 - 400.0 K/uL Final   Neutrophils Relative % 10/23/2023 78.9 (H)  43.0 - 77.0 % Final   Lymphocytes Relative 10/23/2023 9.8 (L)  12.0 - 46.0 % Final   Monocytes Relative 10/23/2023 10.2  3.0 - 12.0 % Final   Eosinophils Relative 10/23/2023 0.8  0.0 - 5.0 % Final   Basophils Relative 10/23/2023 0.3  0.0 - 3.0 % Final  Neutro Abs 10/23/2023 16.1 (H)  1.4 - 7.7 K/uL Final   Lymphs Abs 10/23/2023 2.0  0.7 - 4.0 K/uL Final   Monocytes Absolute 10/23/2023 2.1 (H)  0.1 - 1.0 K/uL Final   Eosinophils Absolute 10/23/2023 0.2  0.0 - 0.7 K/uL Final   Basophils Absolute 10/23/2023 0.1  0.0 - 0.1 K/uL Final   Sodium 10/23/2023 128 (L)  135 - 145 mEq/L Final   Potassium 10/23/2023 3.3 (L)  3.5 - 5.1 mEq/L Final   Chloride 10/23/2023 94 (L)  96 - 112 mEq/L Final   CO2 10/23/2023 21  19 - 32 mEq/L Final   Glucose, Bld 10/23/2023 118 (H)  70 - 99 mg/dL Final   BUN 94/78/7974 33 (H)  6 - 23 mg/dL Final   Creatinine, Ser 10/23/2023 1.66 (H)  0.40 - 1.20 mg/dL Final   Total Bilirubin 10/23/2023 0.9  0.2 - 1.2 mg/dL Final   Alkaline Phosphatase 10/23/2023 175 (H)  39 - 117 U/L Final   AST 10/23/2023 71 (H)  0 - 37 U/L Final   ALT 10/23/2023 175 (H)  0 - 35 U/L Final   Total Protein 10/23/2023 7.5  6.0 - 8.3 g/dL Final   Albumin 94/78/7974 3.6  3.5 - 5.2 g/dL Final   GFR 94/78/7974 32.65 (L)  >60.00 mL/min Final   Calcium  10/23/2023 9.2  8.4 - 10.5 mg/dL Final   Lipase 94/78/7974 31.0  11.0 - 59.0 U/L Final  Admission on 10/17/2023, Discharged on 10/17/2023  Component Date Value Ref Range Status   Sodium 10/17/2023 137  135 - 145 mmol/L Final   Potassium 10/17/2023 3.6  3.5 - 5.1 mmol/L Final   Chloride 10/17/2023 101  98 -  111 mmol/L Final   CO2 10/17/2023 19 (L)  22 - 32 mmol/L Final   Glucose, Bld 10/17/2023 170 (H)  70 - 99 mg/dL Final   BUN 94/84/7974 20  8 - 23 mg/dL Final   Creatinine, Ser 10/17/2023 1.22 (H)  0.44 - 1.00 mg/dL Final   Calcium  10/17/2023 9.6  8.9 - 10.3 mg/dL Final   Total Protein 94/84/7974 7.5  6.5 - 8.1 g/dL Final   Albumin 94/84/7974 4.3  3.5 - 5.0 g/dL Final   AST 94/84/7974 60 (H)  15 - 41 U/L Final   ALT 10/17/2023 65 (H)  0 - 44 U/L Final   Alkaline Phosphatase 10/17/2023 167 (H)  38 - 126 U/L Final   Total Bilirubin 10/17/2023 0.4  0.0 - 1.2 mg/dL Final   GFR, Estimated 10/17/2023 50 (L)  >60 mL/min Final   Anion gap 10/17/2023 17 (H)  5 - 15 Final   WBC 10/17/2023 9.0  4.0 - 10.5 K/uL Final   RBC 10/17/2023 5.20 (H)  3.87 - 5.11 MIL/uL Final   Hemoglobin 10/17/2023 14.7  12.0 - 15.0 g/dL Final   HCT 94/84/7974 43.0  36.0 - 46.0 % Final   MCV 10/17/2023 82.7  80.0 - 100.0 fL Final   MCH 10/17/2023 28.3  26.0 - 34.0 pg Final   MCHC 10/17/2023 34.2  30.0 - 36.0 g/dL Final   RDW 94/84/7974 13.6  11.5 - 15.5 % Final   Platelets 10/17/2023 202  150 - 400 K/uL Final   nRBC 10/17/2023 0.0  0.0 - 0.2 % Final   Color, Urine 10/17/2023 YELLOW  YELLOW Final   APPearance 10/17/2023 HAZY (A)  CLEAR Final   Specific Gravity, Urine 10/17/2023 1.025  1.005 - 1.030 Final   pH 10/17/2023 5.5  5.0 -  8.0 Final   Glucose, UA 10/17/2023 NEGATIVE  NEGATIVE mg/dL Final   Hgb urine dipstick 10/17/2023 SMALL (A)  NEGATIVE Final   Bilirubin Urine 10/17/2023 NEGATIVE  NEGATIVE Final   Ketones, ur 10/17/2023 NEGATIVE  NEGATIVE mg/dL Final   Protein, ur 94/84/7974 30 (A)  NEGATIVE mg/dL Final   Nitrite 94/84/7974 NEGATIVE  NEGATIVE Final   Leukocytes,Ua 10/17/2023 MODERATE (A)  NEGATIVE Final   Lipase 10/17/2023 72 (H)  11 - 51 U/L Final   SARS Coronavirus 2 by RT PCR 10/17/2023 NEGATIVE  NEGATIVE Final   Influenza A by PCR 10/17/2023 NEGATIVE  NEGATIVE Final   Influenza B by PCR 10/17/2023  NEGATIVE  NEGATIVE Final   Resp Syncytial Virus by PCR 10/17/2023 NEGATIVE  NEGATIVE Final   RBC / HPF 10/17/2023 6-10  0 - 5 RBC/hpf Final   WBC, UA 10/17/2023 >50  0 - 5 WBC/hpf Final   Bacteria, UA 10/17/2023 MANY (A)  NONE SEEN Final   Squamous Epithelial / HPF 10/17/2023 6-10  0 - 5 /HPF Final   WBC Clumps 10/17/2023 PRESENT   Final   Mucus 10/17/2023 PRESENT   Final   Hyaline Casts, UA 10/17/2023 PRESENT   Final  Scanned Document on 07/03/2023  Component Date Value Ref Range Status   HM Mammogram 07/02/2023 0-4 [facility]  0-4 [facility], Self Reported Normal Final  There may be more visits with results that are not included.  No image results found. No results found.       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 Symptoms include jaundice and pruritus. A previous liver biopsy showed no chronic rejection. Current medications include Prograf , which may contribute to liver issues. Ursodiol  is prescribed to help dissolve the bile duct obstruction and reduce bilirubin levels. Cholestyramine  is prescribed to manage pruritus. An ERCP is recommended to unclog the bile duct, but she is hesitant. Consultation with a liver specialist is encouraged for medication adjustment, particularly Prograf . A printout of research findings is provided for further discussion with specialists. 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 Previous treatments include steroid creams and antihistamines, but she is sensitive to many medications. Capsaicin  cream is prescribed to manage itching by distracting nerve signals. Hydroyzine is prescribed for nighttime relief of itching and to aid sleep. Referral to a dermatologist is made for further evaluation and  management of eczema and pruritus.   ORDER ASSOCIATIONS  #   DIAGNOSIS / CONDITION ICD-10 ENCOUNTER ORDER     ICD-10-CM   1. History of liver transplant (HCC)  Z94.4 cholestyramine  (QUESTRAN ) 4 g packet    ursodiol  (ACTIGALL ) 300 MG capsule    2. Cholestasis (HCC)  K83.1 cholestyramine  (QUESTRAN ) 4 g packet    ursodiol  (ACTIGALL ) 300 MG capsule    3. 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 Cholestyramine  4 g powder packets Sig: Mix 1 packet (4 g) with water or juice and take by mouth 20 minutes before breakfast. May increase to 1 packet before breakfast  and dinner if tolerated, then titrate up to 4 packets daily (maximum 16 g/day) as needed for pruritus control. Take 1 hour after or 4 hours before tacrolimus  and other medications. \    Dispense:  60 each    Refill:  12   ursodiol  (ACTIGALL ) 300 MG capsule    Sig: Take 1 capsule (300 mg total) by mouth 2 (two) times daily. Take 1 capsule (300 mg) by mouth four times daily with meals lternative once-daily dosing option if preferred for compliance: Sig: Take 4 capsules (1,200 mg total) by mouth once daily at bedtime    Dispense:  120 capsule    Refill:  11   hydrOXYzine  (VISTARIL ) 25 MG capsule    Sig: Take 1 capsule (25 mg total) by mouth every 8 (eight) hours as needed.    Dispense:  30 capsule    Refill:  0   Capsaicin  0.035 % CREA    Sig: Apply 1 Application topically daily.    Dispense:  120 g    Refill:  1   On the day of the visit, I dedicated 33 minutes to both direct and indirect patient care activities.  The time was spent: Preparation: I reviewed the patient's records before and during the visit to support individualized clinical decision making. History: I  obtained, documented, and reviewed a thorough medical history. I reviewed the patient's reported symptoms and clarified their context and significance in relation to the current visit. Examination: I conducted a medically appropriate physical evaluation. Data Synthesis: I synthesized information for clinical decision-making. Communication: I communicated clinical status and plan to the patient and/or family/caregiver. Counseling & Education: I provided personalized counseling on condition and treatment. Documentation: Documenting clinical findings and medical decision-making, and creating and providing documentation for patient review. Treatment Plan: I worked collaboratively with the patient to formulate and communicate an individualized plan (including shared decision-making). Orders: I placed necessary orders (medications, labs, imaging, referrals) in the EMR..  This time was spent independently of any separately billable procedures. Please note that this statement is intended to provide a clear and comprehensive account of the time and services provided during the patient's visit.  The extended time spent was necessary to provide safe, effective, and comprehensive care due to the following factors:, Extensive Comorbidities: The patient's multiple chronic conditions necessitated careful coordination, monitoring, and integration of care plans., Data Analysis & Complex Decision-Making: I performed in-depth data review and complex treatment planning tailored to the patient's unique clinical profile., and Patient Requested Extended Education & Communication: At the patient's request, I spent additional time educating the patient and/or family members (e.g., spouse, child) to ensure understanding of complex health information, answer questions, and support shared decision-making-time that exceeded a typical visit duration and was not separately billable.      This document was synthesized by artificial  intelligence ([employer]) using HIPAA-compliant recording of the clinical interaction;   We discussed the use of AI scribe software for clinical note transcription with the patient, who gave verbal consent to proceed. additional Info: This encounter employed state-of-the-art, real-time, collaborative documentation. The patient actively reviewed and assisted in updating their electronic medical record on a shared screen, ensuring transparency and facilitating joint problem-solving for the problem list, overview, and plan. This approach promotes accurate, informed care. The treatment plan was discussed and reviewed in detail, including medication safety, potential side effects, and all patient questions. We confirmed understanding and comfort with the plan. Follow-up instructions were established, including contacting the office for any concerns,  returning if symptoms worsen, persist, or new symptoms develop, and precautions for potential emergency department visits.

## 2024-06-21 MED ORDER — HYDROXYZINE PAMOATE 25 MG PO CAPS: 25.0000 mg | ORAL_CAPSULE | Freq: Three times a day (TID) | ORAL | 0 refills | Status: AC | PRN

## 2024-06-21 MED ORDER — CAPSAICIN 0.035 % EX CREA: 1.0000 | TOPICAL_CREAM | Freq: Every day | CUTANEOUS | 1 refills | Status: AC

## 2024-06-21 MED ORDER — URSODIOL 300 MG PO CAPS: 300.0000 mg | ORAL_CAPSULE | Freq: Two times a day (BID) | ORAL | 11 refills | Status: AC

## 2024-06-21 MED ORDER — CHOLESTYRAMINE 4 G PO PACK: 4.0000 g | PACK | Freq: Three times a day (TID) | ORAL | 12 refills | Status: AC

## 2024-06-21 NOTE — Assessment & Plan Note (Signed)
 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 Symptoms include jaundice and pruritus. A previous liver biopsy showed no chronic rejection. Current medications include Prograf , which may contribute to liver issues. Ursodiol  is prescribed to help dissolve the bile duct obstruction and reduce bilirubin levels. Cholestyramine  is prescribed to manage pruritus. An ERCP is recommended to unclog the bile duct, but she is hesitant. Consultation with a liver specialist is encouraged for medication adjustment, particularly Prograf . A printout of research findings is provided for further discussion with specialists.

## 2024-06-22 NOTE — Telephone Encounter (Signed)
 Copied from CRM [id number]. Topic: Referral - Question >> Jun 22, 2024 10:07 AM Adelita E wrote: Reason for CRM: Katie with [facility] called stating that the patient's insurance requires a referral to be sent to their location. Agent was able to populate the referral in the system with a pending status, but Katie relayed she was not pulling up this referral when looking under [employer]. Patient is at this office today for an appointment. Callback number for [place] is [phone] ext. [phone].

## 2024-06-30 NOTE — Telephone Encounter (Signed)
 Noted.

## 2024-07-23 ENCOUNTER — Ambulatory Visit: Payer: Medicare Other
